# Patient Record
Sex: Male | Born: 1940
Health system: Southern US, Community
[De-identification: ages and names within clinical notes are randomized; demographics above are authoritative.]

## PROBLEM LIST (undated history)

## (undated) DIAGNOSIS — I779 Disorder of arteries and arterioles, unspecified: Secondary | ICD-10-CM

## (undated) DIAGNOSIS — Z1283 Encounter for screening for malignant neoplasm of skin: Secondary | ICD-10-CM

## (undated) DIAGNOSIS — J189 Pneumonia, unspecified organism: Secondary | ICD-10-CM

## (undated) DIAGNOSIS — E538 Deficiency of other specified B group vitamins: Secondary | ICD-10-CM

## (undated) DIAGNOSIS — K635 Polyp of colon: Secondary | ICD-10-CM

## (undated) DIAGNOSIS — D500 Iron deficiency anemia secondary to blood loss (chronic): Secondary | ICD-10-CM

## (undated) DIAGNOSIS — B019 Varicella without complication: Secondary | ICD-10-CM

## (undated) DIAGNOSIS — D649 Anemia, unspecified: Secondary | ICD-10-CM

## (undated) DIAGNOSIS — R519 Headache, unspecified: Secondary | ICD-10-CM

## (undated) DIAGNOSIS — Z9289 Personal history of other medical treatment: Secondary | ICD-10-CM

## (undated) DIAGNOSIS — T8092XA Unspecified transfusion reaction, initial encounter: Secondary | ICD-10-CM

## (undated) DIAGNOSIS — I10 Essential (primary) hypertension: Secondary | ICD-10-CM

## (undated) DIAGNOSIS — I639 Cerebral infarction, unspecified: Secondary | ICD-10-CM

## (undated) DIAGNOSIS — K219 Gastro-esophageal reflux disease without esophagitis: Secondary | ICD-10-CM

## (undated) DIAGNOSIS — Z87442 Personal history of urinary calculi: Secondary | ICD-10-CM

## (undated) DIAGNOSIS — R51 Headache: Secondary | ICD-10-CM

## (undated) DIAGNOSIS — K649 Unspecified hemorrhoids: Secondary | ICD-10-CM

## (undated) DIAGNOSIS — I739 Peripheral vascular disease, unspecified: Secondary | ICD-10-CM

## (undated) DIAGNOSIS — K5520 Angiodysplasia of colon without hemorrhage: Secondary | ICD-10-CM

## (undated) DIAGNOSIS — C801 Malignant (primary) neoplasm, unspecified: Secondary | ICD-10-CM

## (undated) DIAGNOSIS — K922 Gastrointestinal hemorrhage, unspecified: Secondary | ICD-10-CM

## (undated) DIAGNOSIS — E785 Hyperlipidemia, unspecified: Secondary | ICD-10-CM

## (undated) DIAGNOSIS — M1990 Unspecified osteoarthritis, unspecified site: Secondary | ICD-10-CM

## (undated) DIAGNOSIS — I2510 Atherosclerotic heart disease of native coronary artery without angina pectoris: Secondary | ICD-10-CM

## (undated) HISTORY — DX: Essential (primary) hypertension: I10

## (undated) HISTORY — DX: Deficiency of other specified B group vitamins: E53.8

## (undated) HISTORY — DX: Disorder of arteries and arterioles, unspecified: I77.9

## (undated) HISTORY — PX: BAND HEMORRHOIDECTOMY: SHX1213

## (undated) HISTORY — DX: Unspecified osteoarthritis, unspecified site: M19.90

## (undated) HISTORY — DX: Angiodysplasia of colon without hemorrhage: K55.20

## (undated) HISTORY — DX: Gastro-esophageal reflux disease without esophagitis: K21.9

## (undated) HISTORY — DX: Atherosclerotic heart disease of native coronary artery without angina pectoris: I25.10

## (undated) HISTORY — DX: Hyperlipidemia, unspecified: E78.5

## (undated) HISTORY — PX: CYSTOSCOPY W/ STONE MANIPULATION: SHX1427

## (undated) HISTORY — DX: Anemia, unspecified: D64.9

## (undated) HISTORY — DX: Peripheral vascular disease, unspecified: I73.9

## (undated) HISTORY — DX: Encounter for screening for malignant neoplasm of skin: Z12.83

## (undated) HISTORY — PX: CAROTID ENDARTERECTOMY: SUR193

## (undated) HISTORY — DX: Iron deficiency anemia secondary to blood loss (chronic): D50.0

## (undated) HISTORY — DX: Cerebral infarction, unspecified: I63.9

## (undated) HISTORY — DX: Unspecified hemorrhoids: K64.9

## (undated) HISTORY — DX: Unspecified transfusion reaction, initial encounter: T80.92XA

## (undated) HISTORY — DX: Varicella without complication: B01.9

---

## 1969-07-05 HISTORY — PX: OTHER SURGICAL HISTORY: SHX169

## 1976-11-04 HISTORY — PX: CERVICAL DISC SURGERY: SHX588

## 1998-11-04 HISTORY — PX: OTHER SURGICAL HISTORY: SHX169

## 1999-03-21 ENCOUNTER — Ambulatory Visit: Admission: RE | Admit: 1999-03-21 | Discharge: 1999-03-21 | Payer: Self-pay | Admitting: *Deleted

## 1999-03-28 ENCOUNTER — Inpatient Hospital Stay (HOSPITAL_COMMUNITY): Admission: RE | Admit: 1999-03-28 | Discharge: 1999-03-30 | Payer: Self-pay | Admitting: *Deleted

## 2001-04-17 ENCOUNTER — Encounter: Admission: RE | Admit: 2001-04-17 | Discharge: 2001-04-17 | Payer: Self-pay | Admitting: *Deleted

## 2001-11-04 HISTORY — PX: FEMORAL-FEMORAL BYPASS GRAFT: SHX936

## 2002-06-21 ENCOUNTER — Encounter: Payer: Self-pay | Admitting: Cardiovascular Disease

## 2002-06-21 ENCOUNTER — Ambulatory Visit (HOSPITAL_COMMUNITY): Admission: RE | Admit: 2002-06-21 | Discharge: 2002-06-21 | Payer: Self-pay | Admitting: Cardiovascular Disease

## 2002-06-23 ENCOUNTER — Encounter: Payer: Self-pay | Admitting: Cardiovascular Disease

## 2002-06-23 ENCOUNTER — Ambulatory Visit (HOSPITAL_COMMUNITY): Admission: RE | Admit: 2002-06-23 | Discharge: 2002-06-23 | Payer: Self-pay | Admitting: Cardiovascular Disease

## 2003-08-18 HISTORY — PX: CARDIAC CATHETERIZATION: SHX172

## 2003-08-26 ENCOUNTER — Encounter: Payer: Self-pay | Admitting: Thoracic Surgery (Cardiothoracic Vascular Surgery)

## 2003-08-30 ENCOUNTER — Inpatient Hospital Stay (HOSPITAL_COMMUNITY)
Admission: RE | Admit: 2003-08-30 | Discharge: 2003-09-07 | Payer: Self-pay | Admitting: Thoracic Surgery (Cardiothoracic Vascular Surgery)

## 2003-08-30 HISTORY — PX: CORONARY ARTERY BYPASS GRAFT: SHX141

## 2003-10-03 ENCOUNTER — Encounter
Admission: RE | Admit: 2003-10-03 | Discharge: 2003-10-03 | Payer: Self-pay | Admitting: Thoracic Surgery (Cardiothoracic Vascular Surgery)

## 2005-01-24 ENCOUNTER — Ambulatory Visit (HOSPITAL_COMMUNITY): Admission: RE | Admit: 2005-01-24 | Discharge: 2005-01-24 | Payer: Self-pay | Admitting: Cardiovascular Disease

## 2005-03-26 ENCOUNTER — Ambulatory Visit (HOSPITAL_COMMUNITY): Admission: RE | Admit: 2005-03-26 | Discharge: 2005-03-27 | Payer: Self-pay

## 2005-03-26 ENCOUNTER — Encounter (INDEPENDENT_AMBULATORY_CARE_PROVIDER_SITE_OTHER): Payer: Self-pay | Admitting: *Deleted

## 2005-03-26 HISTORY — PX: CAROTID ENDARTERECTOMY: SUR193

## 2005-09-12 ENCOUNTER — Ambulatory Visit: Payer: Self-pay | Admitting: Family Medicine

## 2005-12-17 ENCOUNTER — Ambulatory Visit: Payer: Self-pay | Admitting: Family Medicine

## 2006-04-01 ENCOUNTER — Ambulatory Visit: Payer: Self-pay | Admitting: Family Medicine

## 2006-10-30 ENCOUNTER — Ambulatory Visit: Payer: Self-pay | Admitting: Internal Medicine

## 2007-04-27 ENCOUNTER — Ambulatory Visit: Payer: Self-pay | Admitting: Family Medicine

## 2007-07-29 DIAGNOSIS — E785 Hyperlipidemia, unspecified: Secondary | ICD-10-CM

## 2007-07-29 DIAGNOSIS — I10 Essential (primary) hypertension: Secondary | ICD-10-CM | POA: Insufficient documentation

## 2008-01-13 ENCOUNTER — Ambulatory Visit: Payer: Self-pay | Admitting: Family Medicine

## 2008-01-13 DIAGNOSIS — Z9189 Other specified personal risk factors, not elsewhere classified: Secondary | ICD-10-CM | POA: Insufficient documentation

## 2008-01-13 DIAGNOSIS — J019 Acute sinusitis, unspecified: Secondary | ICD-10-CM

## 2008-01-19 ENCOUNTER — Telehealth: Payer: Self-pay | Admitting: Family Medicine

## 2008-01-22 ENCOUNTER — Telehealth: Payer: Self-pay | Admitting: Family Medicine

## 2008-01-25 ENCOUNTER — Encounter: Payer: Self-pay | Admitting: Family Medicine

## 2008-02-10 ENCOUNTER — Encounter: Payer: Self-pay | Admitting: Family Medicine

## 2008-03-04 ENCOUNTER — Encounter: Payer: Self-pay | Admitting: Family Medicine

## 2008-03-04 HISTORY — PX: BASAL CELL CARCINOMA EXCISION: SHX1214

## 2008-03-21 ENCOUNTER — Encounter: Payer: Self-pay | Admitting: Family Medicine

## 2008-04-25 ENCOUNTER — Ambulatory Visit: Payer: Self-pay | Admitting: Family Medicine

## 2008-04-25 DIAGNOSIS — H6120 Impacted cerumen, unspecified ear: Secondary | ICD-10-CM

## 2008-05-05 ENCOUNTER — Telehealth: Payer: Self-pay | Admitting: Family Medicine

## 2008-09-13 ENCOUNTER — Encounter: Payer: Self-pay | Admitting: Family Medicine

## 2008-09-21 ENCOUNTER — Encounter: Payer: Self-pay | Admitting: Family Medicine

## 2009-01-13 ENCOUNTER — Ambulatory Visit: Payer: Self-pay | Admitting: Family Medicine

## 2009-01-13 DIAGNOSIS — J209 Acute bronchitis, unspecified: Secondary | ICD-10-CM | POA: Insufficient documentation

## 2009-01-13 DIAGNOSIS — M1990 Unspecified osteoarthritis, unspecified site: Secondary | ICD-10-CM | POA: Insufficient documentation

## 2009-01-13 DIAGNOSIS — I2510 Atherosclerotic heart disease of native coronary artery without angina pectoris: Secondary | ICD-10-CM | POA: Insufficient documentation

## 2009-03-10 ENCOUNTER — Encounter: Payer: Self-pay | Admitting: Family Medicine

## 2009-03-15 ENCOUNTER — Encounter: Payer: Self-pay | Admitting: Family Medicine

## 2009-03-31 ENCOUNTER — Encounter: Payer: Self-pay | Admitting: Family Medicine

## 2009-07-04 ENCOUNTER — Ambulatory Visit: Payer: Self-pay | Admitting: Family Medicine

## 2009-07-18 ENCOUNTER — Telehealth: Payer: Self-pay | Admitting: Family Medicine

## 2009-10-20 ENCOUNTER — Encounter (INDEPENDENT_AMBULATORY_CARE_PROVIDER_SITE_OTHER): Payer: Self-pay | Admitting: *Deleted

## 2009-12-25 ENCOUNTER — Ambulatory Visit: Payer: Self-pay | Admitting: Family Medicine

## 2010-04-30 ENCOUNTER — Encounter: Payer: Self-pay | Admitting: Family Medicine

## 2010-05-16 ENCOUNTER — Encounter: Payer: Self-pay | Admitting: Family Medicine

## 2010-08-22 ENCOUNTER — Ambulatory Visit: Payer: Self-pay | Admitting: Family Medicine

## 2010-08-22 DIAGNOSIS — L509 Urticaria, unspecified: Secondary | ICD-10-CM | POA: Insufficient documentation

## 2010-09-06 ENCOUNTER — Telehealth: Payer: Self-pay | Admitting: Family Medicine

## 2010-09-10 ENCOUNTER — Ambulatory Visit: Payer: Self-pay | Admitting: Family Medicine

## 2010-09-10 DIAGNOSIS — Z87442 Personal history of urinary calculi: Secondary | ICD-10-CM

## 2010-09-10 DIAGNOSIS — N200 Calculus of kidney: Secondary | ICD-10-CM | POA: Insufficient documentation

## 2010-09-11 ENCOUNTER — Encounter: Payer: Self-pay | Admitting: Family Medicine

## 2010-11-09 ENCOUNTER — Encounter: Payer: Self-pay | Admitting: Family Medicine

## 2010-12-06 NOTE — Assessment & Plan Note (Signed)
Summary: SINUSITIS? // RS   Vital Signs:  Patient profile:   70 year old male Weight:      155 pounds O2 Sat:      97 % Temp:     98.1 degrees F Pulse (ortho):   67 / minute BP sitting:   130 / 80  (left arm)  Vitals Entered By: Pura Spice, RN (August 22, 2010 2:11 PM) CC: c/o waking up this am with  bump on rt forehead with pain across forehead down side of face to neck shoulder. c/o sinus inf    History of Present Illness: Here for one week of sinus symptoms and one day of a rash. For the past week he has had sinus pressure, HA, PND, and blowing green mucus from the nose. No cough or fever. Then last night he developed a rash over the forehead and the face. This am the rash has spread to the trunk and upper arms. This is not itchy but it has a burning quality to it. No raised lesions or blisters are seen. He is not taking any new medications. He says he often gets a rash like this when he has an infection.   Allergies (verified): No Known Drug Allergies  Past History:  Past Medical History: Reviewed history from 01/13/2009 and no changes required. Hyperlipidemia Hypertension Chickenpox Blood transfusion Coronary artery disease, sees Dr. Alanda Amass Osteoarthritis, sees Dr. Charlann Boxer  Review of Systems  The patient denies anorexia, fever, weight loss, weight gain, vision loss, decreased hearing, hoarseness, chest pain, syncope, dyspnea on exertion, peripheral edema, prolonged cough, hemoptysis, abdominal pain, melena, hematochezia, severe indigestion/heartburn, hematuria, incontinence, genital sores, muscle weakness, suspicious skin lesions, transient blindness, difficulty walking, depression, unusual weight change, abnormal bleeding, enlarged lymph nodes, angioedema, breast masses, and testicular masses.    Physical Exam  General:  Well-developed,well-nourished,in no acute distress; alert,appropriate and cooperative throughout examination Head:  Normocephalic and atraumatic  without obvious abnormalities. No apparent alopecia or balding. Eyes:  No corneal or conjunctival inflammation noted. EOMI. Perrla. Funduscopic exam benign, without hemorrhages, exudates or papilledema. Vision grossly normal. Ears:  External ear exam shows no significant lesions or deformities.  Otoscopic examination reveals clear canals, tympanic membranes are intact bilaterally without bulging, retraction, inflammation or discharge. Hearing is grossly normal bilaterally. Nose:  External nasal examination shows no deformity or inflammation. Nasal mucosa are pink and moist without lesions or exudates. Mouth:  Oral mucosa and oropharynx without lesions or exudates.  Teeth in good repair. Neck:  No deformities, masses, or tenderness noted. Lungs:  Normal respiratory effort, chest expands symmetrically. Lungs are clear to auscultation, no crackles or wheezes. Skin:  macular erythematous rash as above. No vessicles    Impression & Recommendations:  Problem # 1:  ACUTE SINUSITIS, UNSPECIFIED (ICD-461.9)  His updated medication list for this problem includes:    Levaquin 500 Mg Tabs (Levofloxacin) ..... Once daily  Problem # 2:  URTICARIA (ICD-708.9)  Complete Medication List: 1)  Metoprolol Succinate 50 Mg Tb24 (Metoprolol succinate) .... 1/2 twice daily 2)  Altace 10 Mg Tabs (Ramipril) .... Once daily 3)  Hydrochlorothiazide 25 Mg Tabs (Hydrochlorothiazide) .... 2 daily 4)  Simvastatin 20 Mg Tabs (Simvastatin) .... Take 1 tablet by mouth once a day 5)  Aspirin 325 Mg Tbec (Aspirin) .... Once daily 6)  Nifedipine Cr Osmotic 90 Mg Tb24 (Nifedipine) .Marland Kitchen.. 1 by mouth once daily 7)  Cortisporin 5-10000-1 Susp (Neomycin-polymyxin-hc) .... 4 drops into ears 4 times a day as needed 8)  Levaquin 500 Mg Tabs (Levofloxacin) .... Once daily  Patient Instructions: 1)  the hives seems to be coming from the sinusitis. Try some Benadryl as needed . 2)  Please schedule a follow-up appointment as needed .    Prescriptions: LEVAQUIN 500 MG TABS (LEVOFLOXACIN) once daily  #10 x 0   Entered and Authorized by:   Nelwyn Salisbury MD   Signed by:   Nelwyn Salisbury MD on 08/22/2010   Method used:   [facility]        [facility] [phone]retail[facility]       [address]       [place], [place]  [address]       Ph: [phone] or [phone]       Fax: [phone]   RxID:   [id number]    Orders Added: 1)  Est. Patient Level IV [36644]

## 2010-12-06 NOTE — Letter (Signed)
Summary: [facility]   Imported By: Maryln Gottron 11/22/2010 10:00:31  _____________________________________________________________________  External Attachment:    Type:   Image     Comment:   External Document

## 2010-12-06 NOTE — Assessment & Plan Note (Signed)
Summary: ear inj/sinus inf/fatigue/per Dr. Karalee Height   Vital Signs:  Patient profile:   70 year old male Weight:      154 pounds BMI:     24.21 Temp:     98.2 degrees F oral Pulse rate:   56 / minute BP sitting:   138 / 62  (left arm) Cuff size:   regular  Vitals Entered By: Alfred Levins, CMA (December 25, 2009 1:41 PM) CC: sinus pressure, ears are clogged   History of Present Illness: Here for one week of pain in the ears, pressure in the head, decreased hearing, and some mild dizziness. No ST or cough or fever.   Allergies: No Known Drug Allergies  Past History:  Past Medical History: Reviewed history from 01/13/2009 and no changes required. Hyperlipidemia Hypertension Chickenpox Blood transfusion Coronary artery disease, sees Dr. Alanda Amass Osteoarthritis, sees Dr. Charlann Boxer  Review of Systems  The patient denies anorexia, fever, weight loss, weight gain, vision loss, hoarseness, chest pain, syncope, dyspnea on exertion, peripheral edema, prolonged cough, headaches, hemoptysis, abdominal pain, melena, hematochezia, severe indigestion/heartburn, hematuria, incontinence, genital sores, muscle weakness, suspicious skin lesions, transient blindness, difficulty walking, depression, unusual weight change, abnormal bleeding, enlarged lymph nodes, angioedema, breast masses, and testicular masses.    Physical Exam  General:  Well-developed,well-nourished,in no acute distress; alert,appropriate and cooperative throughout examination Head:  Normocephalic and atraumatic without obvious abnormalities. No apparent alopecia or balding. Eyes:  No corneal or conjunctival inflammation noted. EOMI. Perrla. Funduscopic exam benign, without hemorrhages, exudates or papilledema. Vision grossly normal. Ears:  both ears have very narrowed canals, and both are filled with cerumen Nose:  External nasal examination shows no deformity or inflammation. Nasal mucosa are pink and moist without lesions or  exudates. Mouth:  Oral mucosa and oropharynx without lesions or exudates.  Teeth in good repair. Neck:  No deformities, masses, or tenderness noted. Lungs:  Normal respiratory effort, chest expands symmetrically. Lungs are clear to auscultation, no crackles or wheezes.   Impression & Recommendations:  Problem # 1:  CERUMEN IMPACTION (ICD-380.4)  Complete Medication List: 1)  Metoprolol Succinate 50 Mg Tb24 (Metoprolol succinate) .... 1/2 twice daily 2)  Altace 10 Mg Tabs (Ramipril) .... Once daily 3)  Hydrochlorothiazide 25 Mg Tabs (Hydrochlorothiazide) .... 2 daily 4)  Simvastatin 20 Mg Tabs (Simvastatin) .... Take 1 tablet by mouth once a day 5)  Aspirin 325 Mg Tbec (Aspirin) .... Once daily 6)  Nifedipine Cr Osmotic 90 Mg Tb24 (Nifedipine) .Marland Kitchen.. 1 by mouth once daily 7)  Cortisporin 5-10000-1 Susp (Neomycin-polymyxin-hc) .... 4 drops into ears 4 times a day as needed  Patient Instructions: 1)  the ears were irrigated clear with water. 2)  Please schedule a follow-up appointment as needed .  Prescriptions: CORTISPORIN 5-10000-1  SUSP (NEOMYCIN-POLYMYXIN-HC) 4 drops into ears 4 times a day as needed  #30 x 5   Entered and Authorized by:   Nelwyn Salisbury MD   Signed by:   Nelwyn Salisbury MD on 12/25/2009   Method used:   [facility]        [phone][facility] (retail[facility]       [address]       [place], [place]  [address]       Ph: [phone] or [phone]       Fax: [phone]   RxID:   [id number]

## 2010-12-06 NOTE — Progress Notes (Signed)
Summary: Pt passed large kidney stone this a.m. Req antibiotic   Phone Note Call from Patient Call back at Home Phone [phone] Call back at Leave detailed message   Caller: Patient Summary of Call: Pt called and said that he passed a kidney stone this a.m. and would like to know if he needs an antibiotic for inflamation? Pt says that the stone was about size of pinto bean and now it burns severely when he urinates. [facility] [address]  Initial call taken by: Lucy Antigua,  September 06, 2010 8:50 AM  Follow-up for Phone Call        call in Cipro 500 mg two times a day for 7 days  Follow-up by: Nelwyn Salisbury MD,  September 06, 2010 9:47 AM  Additional Follow-up for Phone Call Additional follow up Details #1::        ok done  pt aware.  Additional Follow-up by: Pura Spice, RN,  September 06, 2010 11:56 AM    New/Updated Medications: CIPRO 500 MG TABS (CIPROFLOXACIN HCL) 1 by mouth two times a day for seven days Prescriptions: CIPRO 500 MG TABS (CIPROFLOXACIN HCL) 1 by mouth two times a day for seven days  #14 x 0   Entered by:   Pura Spice, RN   Authorized by:   Nelwyn Salisbury MD   Signed by:   Pura Spice, RN on 09/06/2010   Method used:   [facility] to        [facility] [phone][facility] (retail[facility]       [address]       [place], [place]  [address]       Ph: [phone] or [phone]       Fax: [phone]   RxID:   [id number]   Appended Document: Pt passed large kidney stone this a.m. Req antibiotic  Pt has passed another larger kidney stone.

## 2010-12-06 NOTE — Assessment & Plan Note (Signed)
Summary: PT PASSED KIDNEY STONES/RCD   Vital Signs:  Patient profile:   70 year old male Weight:      154 pounds O2 Sat:      97 % Temp:     98.3 degrees F Pulse rate:   63 / minute BP sitting:   124 / 72  (left arm)  Vitals Entered By: Pura Spice, RN (September 10, 2010 10:06 AM) CC: passed  2 kidney stones last Thurdsday  denies hemauria and no pain    History of Present Illness: Here to follow up after passing 2 kidney stones last week at home. Surprisingly he had little pain during this. Now he feels fine, no discomfort, passing urine easily. Of note, he sees Dr. Alanda Amass every 3 months and he always gets an abdominal US in that office to check his kidneys and renal arteries. He will  be seen there next week. He brings the stones with him today in a pill bottle.   Allergies: No Known Drug Allergies  Past History:  Past Medical History: Hyperlipidemia Hypertension Chickenpox Blood transfusion Coronary artery disease, sees Dr. Alanda Amass Osteoarthritis, sees Dr. Charlann Boxer Nephrolithiasis, hx of  Past Surgical History: Neck surgery 1978 Bypass rt leg 2000 Coronary bypass 2003 right Carotid endarterectomy basket extraction of ureteral stone in 1970s  Review of Systems  The patient denies anorexia, fever, weight loss, weight gain, vision loss, decreased hearing, hoarseness, chest pain, syncope, dyspnea on exertion, peripheral edema, prolonged cough, headaches, hemoptysis, abdominal pain, melena, hematochezia, severe indigestion/heartburn, hematuria, incontinence, genital sores, muscle weakness, suspicious skin lesions, transient blindness, difficulty walking, depression, unusual weight change, abnormal bleeding, enlarged lymph nodes, angioedema, breast masses, and testicular masses.    Physical Exam  General:  Well-developed,well-nourished,in no acute distress; alert,appropriate and cooperative throughout examination. The 2 stones are smooth, round, and about 8mm in  diameter Abdomen:  Bowel sounds positive,abdomen soft and non-tender without masses, organomegaly or hernias noted.   Impression & Recommendations:  Problem # 1:  NEPHROLITHIASIS (ICD-592.0)  Orders: T- * Misc. Laboratory test [phone])  Complete Medication List: 1)  Metoprolol Succinate 50 Mg Tb24 (Metoprolol succinate) .... 1/2 twice daily 2)  Altace 10 Mg Tabs (Ramipril) .... Once daily 3)  Hydrochlorothiazide 25 Mg Tabs (Hydrochlorothiazide) .... 2 daily 4)  Simvastatin 20 Mg Tabs (Simvastatin) .... Take 1 tablet by mouth once a day 5)  Aspirin 325 Mg Tbec (Aspirin) .... Once daily 6)  Nifedipine Cr Osmotic 90 Mg Tb24 (Nifedipine) .Marland Kitchen.. 1 by mouth once daily 7)  Cortisporin 5-10000-1 Susp (Neomycin-polymyxin-hc) .... 4 drops into ears 4 times a day as needed 8)  Cipro 500 Mg Tabs (Ciprofloxacin hcl) .Marland Kitchen.. 1 by mouth two times a day for seven days  Patient Instructions: 1)  I suggested he drink plenty of water every day. Will send one stone to the lab for analysis. He will get the [place] next week, and this will tell us if there are any more calculi inside of him.    Orders Added: 1)  Est. Patient Level IV [60454] 2)  T- * Misc. Laboratory test [phone]

## 2010-12-07 NOTE — Letter (Signed)
Summary: [facility]   Imported By: Maryln Gottron 05/08/2010 14:14:11  _____________________________________________________________________  External Attachment:    Type:   Image     Comment:   External Document

## 2011-01-14 ENCOUNTER — Other Ambulatory Visit: Payer: Self-pay | Admitting: Family Medicine

## 2011-01-15 MED ORDER — NEOMYCIN-POLYMYXIN-HC 3.5-10000-1 OT SOLN: 4.0000 [drp] | Freq: Three times a day (TID) | OTIC | Status: AC

## 2011-01-15 MED ORDER — NEOMYCIN-POLYMYXIN-HC 3.5-10000-1 OT SUSP: 4.0000 [drp] | Freq: Four times a day (QID) | OTIC | Status: DC

## 2011-03-22 NOTE — Discharge Summary (Signed)
NAME:  Earl Castillo, Earl Castillo NO.:  [id number]   MEDICAL RECORD NO.:  [id number]                   PATIENT TYPE:  INP   LOCATION:  2035                                 FACILITY:  [facility]   PHYSICIAN:  Salvatore Decent. Cornelius Moras, M.D.              DATE OF BIRTH:  October 17, 1941   DATE OF ADMISSION:  08/30/2003  DATE OF DISCHARGE:                                 DISCHARGE SUMMARY   PRIMARY ADMITTING DIAGNOSIS:  Severe three-vessel coronary artery disease.   ADDITIONAL/DISCHARGE DIAGNOSES:  1. Severe three-vessel coronary artery disease.  2. Hypertension.  3. Hyperlipidemia.  4. Longstanding tobacco abuse.  5. History of kidney stones.  6. Postoperative tracheobronchitis.  7. Mild postoperative anemia.  8. Sternal drainage, resolved.   PROCEDURES PERFORMED:  1. Coronary artery bypass grafting x3 (left internal mammary artery to the     LAD distally, right internal mammary artery to the first circumflex     marginal, saphenous vein graft to the distal right coronary artery).  2. Endoscopic vein harvest, left thigh.   HISTORY:  The patient is a 70 year old white male with a history of multiple  risk factors including hypertension, hyperlipidemia, longstanding tobacco  abuse, and strong family history of coronary artery disease.  He has no  previous history of coronary artery disease himself but has been followed  closely by Dr. Alanda Amass over the years.  He recently underwent a stress  Cardiolite exam on August 01, 2003.  This demonstrated evidence of  probable ischemia in the inferolateral wall which was new in comparison with  previous studies.  His resting ejection fraction was 62%.  He underwent  cardiac catheterization on August 18, 2003 and was found to have severe  three-vessel coronary artery disease with well preserved left ventricular  function.  He was not felt to be a candidate for percutaneous intervention.  He was seen by Dr. Tressie Stalker as an  outpatient in consultation for  possible surgical intervention.  Dr. Cornelius Moras felt that the patient was a good  candidate and that surgical revascularization is his best long-term option.  After the discussion of the risks, benefits, and alternatives to the  procedure the patient agreed to proceed.  He was scheduled for elective  admission on August 30, 2003.   HOSPITAL COURSE:  He was admitted on August 30, 2003 and was taken to the  operating room where he underwent CABG x3 as described in detail above.  He  tolerated the procedure well and he was transferred to the SICU in stable  condition.  He was extubated shortly after surgery.  He was hemodynamically  stable and was doing well on postoperative day #1.  Initially he was  atrially paced but over the course of the first 24 hours this was weaned and  discontinued.  At that time he was noted to be in normal sinus rhythm with  rates in the 80s to  90s.  He was started on beta blocker therapy.  His chest  tubes were removed and he was transferred to the floor.  He was mobilized  with the assistance of cardiac rehab phase 1.  He was started on diuresis  for volume overload.  He initially ran low-grade fevers around 100.6 to  100.9.  He developed a productive cough.  His white blood cell count was  also elevated at 24,000.  He was started on IV vancomycin as well as  Maxipime.  He was treated with aggressive pulmonary toilet measures  including incentive spirometry and flutter valve as well as nebulizer  treatments.  Over the course of the next 48 hours his white blood cell count  began to trend downward and from that point on he remained afebrile.  He  also was noted to have a small amount of drainage from his sternal incision,  mostly serosanguineous.  This was observed closely as well and over the  course of the next several days began to improve significantly.  He has also  been somewhat anemic in his postoperative period with hemoglobin  and  hematocrit at 7.7 and 21.7 at present.  He has remained asymptomatic and has  been started on iron replacement therapy.  Dr. Barry Dienes discussed possible  transfusion with the patient but since he is asymptomatic and the patient  desired to avoid transfusion if possible, it was elected to monitor this  closely and treat conservatively.  He has now completed four days of IV  antibiotics and has been switched to p.o. Augmentin.  Throughout this time  he has been slowly weaned from supplemental oxygen and is maintaining O2  saturations of greater than 90% on room air.  Again, he has remained  afebrile; also, other vital signs have been stable.  He has remained in  normal sinus rhythm throughout his admission.  He has been diuresed back  down to within 1 pound of his preoperative weight.  His surgical incision  sites are all healing well and his sternum has remained stable, and the  sternal drainage has resolved.  His pulmonary issues are also improving.  His white blood cell count is now down to 7.8.  His other labs show  hemoglobin of 7.7, hematocrit 21.7 as previously mentioned, platelets of  267, potassium 3.8 which has been supplemented, BUN 8, creatinine 0.9.  He  has been tolerating a regular diet and having normal bowel and bladder  function.  He is ambulating in the hall without difficulty.  It is felt that  if he continues to remain stable over the next 24 hours and no other  problems occur he should be ready for discharge home by September 07, 2003.   DISCHARGE MEDICATIONS:  1. Enteric-coated aspirin 325 mg daily.  2. Augmentin 875 b.i.d. for one week.  3. Altace 5 mg daily.  4. Lopressor 25 mg b.i.d.  5. Niferex 150 mg daily.  6. Zocor 20 mg q.h.s.  7. Tylox one to two q.4h. p.r.n. for pain.   DISCHARGE INSTRUCTIONS:  He is to refrain from driving, heavy lifting, or  strenuous activity.  He may continue daily walking, use of his incentive spirometer.  He is asked to continue a  low fat/low sodium diet.  He may  shower daily and clean his incisions with soap and water.   DISCHARGE FOLLOW-UP:  He is asked to see Dr. Alanda Amass on September 26, 2003  at 11:15 a.m.  He will follow up with Dr.  Cornelius Moras on Monday, October 03, 2003  at 1 p.m.  He will have a chest x-ray at [facility] one  hour prior to this appointment and will bring his films to the [facility] office  for Dr. Cornelius Moras to review.  He should call our office in the interim if he  experiences any problems or has questions.      Coral Ceo, P.A.                        Salvatore Decent. Cornelius Moras, M.D.    GC/MEDQ  D:  09/06/2003  T:  09/07/2003  Job:  [id number]   cc:   Gerlene Burdock A. Alanda Amass, M.D.  [phone] [address]  [place]  [place] [address]  Fax: [phone]

## 2011-03-22 NOTE — H&P (Signed)
NAME:  Earl Castillo.                        ACCOUNT NO.:  [id number]   MEDICAL RECORD NO.:  [id number]                   PATIENT TYPE:  INP   LOCATION:                                       FACILITY:  [facility]   PHYSICIAN:  Salvatore Decent. Cornelius Moras, M.D.              DATE OF BIRTH:  [birth year]   DATE OF ADMISSION:  08/30/2003  DATE OF DISCHARGE:                                HISTORY & PHYSICAL   DATE OF PLANNED HOSPITAL ADMISSION:  August 30, 2003   REFERRING PHYSICIAN:  Richard A. Alanda Amass, M.D.   REASON FOR CONSULTATION:  Three-vessel coronary artery disease.   HISTORY OF PRESENT ILLNESS:  Mr. Durr is a 70 year old gentleman with no  previous history of coronary artery disease but risk factors including  history of hypertension, hyperlipidemia, longstanding tobacco abuse, and a  strong family history of premature coronary artery disease.  The patient has  been followed closely by Dr. Alanda Amass over the years and he recently  underwent a follow-up stress Cardiolite exam on August 01, 2003.  This  examination demonstrated new evidence of probable ischemia in the  inferolateral wall which was new in comparison with previous studies.  His  resting ejection fraction remained normal at 62%.  The patient subsequently  underwent elective cardiac catheterization on August 18, 2003.  This was  performed at the [facility] and findings were notable for  severe three-vessel coronary artery disease with coronary anatomy not  favorable for percutaneous coronary intervention.  Left ventricular function  remained normal.  The patient has now been referred for elective surgical  consultation.   REVIEW OF SYSTEMS:  GENERAL:  The patient reports feeling well.  He has good  appetite, has not been gaining or losing weight.  CARDIAC:  The patient  specifically denies any symptoms suggestive of angina.  He reports no  exertional shortness of breath, chest pain, or chest tightness.  The  patient  denies history of PND, orthopnea, or lower extremity edema.  He has not had  any palpitations or syncopal episodes.  RESPIRATORY:  Negative.  The patient  does have persistent intermittent dry, nonproductive cough which he relates  to longstanding tobacco use.  He denies any productive cough, hemoptysis, or  wheezing.  GASTROINTESTINAL:  Essentially negative.  The patient does report  intermittent mild constipation.  He has no difficulty swallowing.  He denies  problems with symptoms of reflux or frequent dyspepsia.  He has not had  diarrhea.  He reports no history of hematochezia, hematemesis, or melena.  MUSCULOSKELETAL:  Notable in that the patient strained his left lower back  several months ago and this has been slow to resolve.  He otherwise reports  no complaints.  PERIPHERAL VASCULAR:  Negative.  The patient denies any  symptoms suggestive of claudication although he does note that both legs  hurt when he pushed himself during his stress test performed  recently.  He  states that he was essentially running at the end of this procedure.  He  denies any problems with phlebitis or lower extremity edema.  ENDOCRINE:  Negative.  The patient denies symptoms suggestive of diabetes.  NEUROLOGIC:  Negative.  The patient denies symptoms suggestive of previous TIA or stroke.  INFECTIOUS:  Negative.  The patient denies recent fevers or chills.  HEMATOLOGIC:  Negative.  The patient denies bleeding diathesis.  PSYCHIATRIC:  Negative.  HEENT:  Negative.   PAST MEDICAL HISTORY:  Notable for history of hypertension, peripheral  vascular disease, hyperlipidemia, and longstanding tobacco abuse.  The  patient also has had kidney stones in the distant past and he was told that  he had an ulcer many years ago although this resolved without significant  diagnostic intervention or medical treatment.  The patient specifically  denies any previous history of myocardial infarction, congestive heart   failure, previous stroke, or diabetes mellitus.   PAST SURGICAL HISTORY:  Notable for cervical laminectomy and discectomy in  1978 as well as left-to-right femoral-femoral bypass by Dr. Madilyn Fireman in May  2000 for peripheral vascular disease with right lower extremity  claudication.   FAMILY HISTORY:  Notable for a strong history of premature coronary artery  disease.  The patient's father died of myocardial infarction at age 62.  One  of the patient's brothers died of myocardial infarction at age 56.  I have  personally operated on one of the patient's brothers for coronary artery  bypass surgery.  I have also operated on his wife for the same problem.   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  He has a longstanding history of heavy  tobacco abuse although he states he has cut back to the point where he now  smokes one-half pack of cigarettes per day.  The patient denies history of  excessive alcohol consumption.   CURRENT MEDICATIONS:  1. Aspirin 81 mg once daily.  2. Altace 10 mg once daily.  3. Zocor 20 mg once daily.  4. Hydrochlorothiazide 50 mg once daily.  5. Norvasc 10 mg once daily.  6. The patient has been taking Plavix 75 mg once daily although he stopped     this four days ago after his cardiac catheterization.   ALLERGIES:  The patient denies any known drug allergies or sensitivities.   PHYSICAL EXAMINATION:  GENERAL:  Notable for a well-appearing white male who  appears his stated age in no acute distress.  VITAL SIGNS:  Blood pressure is 160/70, pulse 72 and regular, oxygen  saturation 97% on room air; he is afebrile.  HEENT:  Essentially within normal limits.  NECK:  Supple.  There is no cervical or supraclavicular lymphadenopathy.  There is no jugular venous distention. CHEST:  Auscultation demonstrates clear and  symmetrical breath sounds  bilaterally with exception of a few scattered inspiratory crackles.  No  wheezes or rhonchi are noted.  CARDIOVASCULAR:  Included regular rate and rhythm.  No murmurs, rubs, or  gallops are noted.  ABDOMEN:  Flat, soft, and nontender.  There are no palpable masses.  Bowel  sounds are present.  The liver edge is not palpable.  EXTREMITIES:  Warm and well perfused.  There is no lower extremity edema.  Distal pulses are not palpable in either lower leg at the ankle.  There are  well-healed surgical scars in both groins from previous surgery with  palpable pulses bilaterally.  There is no sign of venous insufficiency.  RECTAL AND GENITOURINARY:  Exams are both deferred.  NEUROLOGIC:  Grossly nonfocal.  SKIN:  Clean, dry, healthy-appearing throughout.   DIAGNOSTIC TESTS:  Cardiac catheterization performed October 14 at the  [facility] is reviewed and notable for severe three-vessel  coronary artery disease with normal left ventricular function.  Specifically, there is 80% ostial stenosis of the left anterior descending  coronary artery.  There is 95% stenosis of the left circumflex coronary  artery just before bifurcation involving a large circumflex marginal branch.  There is also 95% ST elevation of the left circumflex after take-off of the  circumflex marginal branch.  There is 70% proximal stenosis and 60-70%  stenosis of the mid right coronary artery.  There is right dominant coronary  circulation.  Left ventricular function is normal with ejection fraction  estimated at 55-60% and no significant wall motion abnormalities.   Laboratory data obtained on October 11 are reviewed and notable for  hemoglobin of 16.1; hematocrit 45.6%; white blood count 9000; platelet count  218,000.  Basic metabolic panel includes sodium 138, potassium 3.4, chloride  102, bicarb 29, BUN 16, creatinine 1.0, glucose 114.  Coagulation profile is  normal with prothrombin  time 13.7 seconds, INR 1.1, PTT 39 seconds.  Lipid  profile was normal with total cholesterol 118, triglyceride 83, HDL  cholesterol 41, LDL cholesterol 60.  Urinalysis was negative and liver  function profile was within normal limits.   IMPRESSION:  Severe three-vessel coronary artery disease with normal left  ventricular function, abnormal stress Cardiolite exam, and coronary anatomy  not favorable for percutaneous coronary intervention.  I believe that  surgical revascularization is likely the best long-term treatment strategy.   PLAN:  I have outlined options at length with Mr. Kuba and his wife here  in the office today.  Alternative treatment strategies have been discussed.  They understand and accept all associated risks of surgery including but not  limited to risk of death, stroke, myocardial infarction, congestive heart  failure, pneumonia, bleeding requiring blood transfusion, arrhythmia, infection, and recurrent coronary artery disease.  We tentatively plan to  proceed with surgery on Tuesday, August 30, 2003.  All of their questions  have been addressed.                                                Salvatore Decent. Cornelius Moras, M.D.    CHO/MEDQ  D:  08/22/2003  T:  08/22/2003  Job:  [id number]   cc:   Gerlene Burdock A. Alanda Amass, M.D.  [phone] [address]  [place]  [place] [address]  Fax: [phone]

## 2011-03-22 NOTE — Op Note (Signed)
NAMEASHTIAN, VILLACIS NO.:  [id number]   MEDICAL RECORD NO.:  [id number]          PATIENT TYPE:  OIB   LOCATION:  2550                         FACILITY:  [facility]   PHYSICIAN:  Lorre Munroe., M.D.DATE OF BIRTH:  Aug 29, 1941   DATE OF PROCEDURE:  03/26/2005  DATE OF DISCHARGE:                                 OPERATIVE REPORT   POSTOPERATIVE DIAGNOSIS:  Asymptomatic right carotid stenosis.   OPERATION:  Right carotid endarterectomy.   SURGEON:  Lebron Conners, M.D.   ASSISTANT:  Rose Phi. Maple Hudson, M.D.   ANESTHESIA:  General.   CLINICAL NOTE:  The patient is a 70 year old white male who has known  peripheral vascular disease and was found to have a proximal right internal  carotid stenosis of hemodynamic significance on noninvasive study and this  was confirmed angiographically. He had no symptoms of transient blindness,  weakness or numbness. He was sent to me with recommendation for carotid  endarterectomy and I agreed that this operation was indicated and I  recommended it to him and he agreed to have it done.   PROCEDURE:  After the patient was monitored and anesthetized and had routine  preparation and draping of the right side of the neck, I made a short  incision and the mid neck paralleling the anterior border of the  sternocleidomastoid muscle staying as well down below the angle of jaw as I  could to prevent facial nerve injury. I dissected down through the  subcutaneous tissue and platysma using cautery for dissection and for  hemostasis. I encountered the common facial vein and there were actually two  veins running across the carotid and I ligated each with 2-0 silk and  divided them. I then dissected directly to the carotid pulse and came down  on the distal common carotid artery and dissected around it and controlled  it. I then took the dissection up to the carotid bifurcation identifying the  hypoglossal nerve crossing across the vessels and  mobilized and spared it  from harm. I dissected out and controlled the superior thyroid artery and  the external carotid artery and its branches and the internal carotid  artery. I dissected up the internal carotid until it softened and became a  normal feeling vessel. The plaque was easily palpable. After getting good  control and visibility and after the patient received 6000 units of heparin  intravenously, I clamped the common carotid and tightened the controls on  the other vessels and made a longitudinal arteriotomy across the plaque  beginning in the common carotid and going into the internal.  There were no  loose clots or loose fragments of plaque present in the vessel. There was  back bleeding present from the internal and external carotids. I then placed  a 10-French straight vascular shunt from the common carotid to the internal  carotid and first allowing back bleeding from the internal carotid and  tightened controls on that and restored flow to the brain. I then worked  leisurely to separate the plaque from the vessel wall and it separated  nicely. I began the  dissection of the common carotid and transected the  plaque circumferentially and then worked around it and separated the plaque  circumferentially in the proximal internal carotid and divided the plaque at  that point. I intussuscepted plaque out of the external carotid and removed  it and then back bled the external carotid again and filled vessel with  heparinized saline solution and retightened the controls. I then carefully  took the dissection up the internal carotid and the plaque thinned out very  nicely and separated easily from posterior vessel wall. I irrigated it  vigorously and saw no tendency for discussion.  It was a normal-appearing  intima at the point of separation. I removed a few small smooth muscle  fibers and loose plaque from the vessel and irrigated it out thoroughly. I  then closed the  arteriotomy by sewing in a Hemashield Finesse patch using 6-  0 Prolene suture beginning in the internal carotid and sewing down each side  into the common.  When a few sutures remained to be placed, I removed the  shunt and flushed the vessel.  I once again back bled the vessels and again  flushed the vessel. I reapplied the clamp and the finished the arteriotomy  and then restored the flow first into the external carotid and then into the  internal carotid. I got good pulses in both vessels. There was one small  flute requiring repair and then after that was done hemostasis was good.  Nevertheless I put some Surgicel on the area to assure excellent hemostasis.  I irrigated the wound with normal saline solution and removed that and felt  closure was ready as there was good hemostasis. I closed two layers of the  cervical fascia with a running 3-0 Vicryl suture and closed the skin with  running intracuticular Vicryl and Steri-Strips. The patient tolerated the  operation well, woke up without neurological deficit.      WB/MEDQ  D:  03/26/2005  T:  03/26/2005  Job:  [id number]   cc:   Gerlene Burdock A. Alanda Amass, M.D.  [phone] [address]  [place]  [place] [address]  Fax: [phone]   Tera Mater. Clent Ridges, M.D. [facility]

## 2011-03-22 NOTE — Cardiovascular Report (Signed)
NAMEBRENDT, DIBLE NO.:  [id number]   MEDICAL RECORD NO.:  [id number]          PATIENT TYPE:  OIB   LOCATION:  2866                         FACILITY:  [facility]   PHYSICIAN:  Richard A. Alanda Amass, M.D.DATE OF BIRTH:  February 17, 1941   DATE OF PROCEDURE:  01/24/2005  DATE OF DISCHARGE:                              CARDIAC CATHETERIZATION   PROCEDURE:  Retrograde central aortic catheterization, aortic arch angiogram  LAO projection, four vessel cerebral angiography using DSA imaging, and hand  injection.   PRIMARY OPERATOR:  Richard A. Alanda Amass, M.D.   PROCTOR:  Salvadore Farber, M.D. [facility]   COMPLICATIONS:  None.   ESTIMATED BLOOD LOSS:  Approximately 15 mL.   ANESTHESIA:  5 mg Valium p.o. premedication, 1% local Xylocaine, 2 mg Versed  IV for sedation.   DESCRIPTION OF PROCEDURE:  The patient is brought to the sixth floor PV lab  in a postabsorptive state after 5 mg Valium p.o. premedication.  The left  groin was prepped and draped in the usual manner because the patient has a  left to right fem/fem bypass graft.  1% Xylocaine was used for local  anesthesia.  A single anterior puncture was done with an 18 thin wall needle  and a Wholey wire was used to traverse the iliac system.  The wire, however,  went into the fem/fem bypass graft.  A 5 French sheath was inserted,  carefully positioned just beyond the puncture site, and a short right  coronary catheter was used to try to direct the guide-wire into the LEIA.  We were unable to get into the LEIA and  0.014 inch wire was then used to  try to access the native left iliac circulation.  This was also  unsuccessful, so the side-arm sheath was removed.  The patient had not been  given any heparin and pressure was placed over the left groin to control  hemostasis.  After an adequate observation, another puncture was done  slightly more lateral using a previous hand injection as a dye.  The LCFA  was entered with a  single anterior puncture and using the [facility] wire, we  were able to traverse the iliac system.  There was scar tissue there from  his prior surgery, so a 5 [place] dilator was inserted and the wire was  exchanged for an Amplatz 0.035 inch extra simple wire.  We then placed a 5  French sheath without difficulty and the wire was removed.  A 5 French  pigtail catheter was advanced across the aortic arch and aortic arch  angiogram was done using VSA in the LAO projection at 25 mL, 20 mL per  second.  The catheter was removed and replaced with a JB1 5 French  diagnostic catheter.  The right brachiocephalic was entered and the catheter  was positioned beyond the RCCA.  Hand injections of the right vertebral  system were done in the cervical PA and lateral and cranial PA and lateral  projections with good visualization.  The catheter was then pulled back and  the RCCA was accessed without difficulty.  Right carotid angiography  was  done in the PA, oblique, and lateral projections, and the PA and lateral  intracerebral projections.  The catheter was then pulled back and the LACA  was accessed and left subclavian vertebral angiography subselective was  performed in the PA projection and intracerebral PA and lateral projections.  The catheter was then pulled back and the LCCA was accessed.  Left carotid  angiography was done in the oblique, lateral, cervical projections, and PA  and lateral intracranial projections.  The catheter was removed over the  guide-wire, the side-arm sheath was flushed.  The patient tolerated the  procedure well and was transferred to the holding area in stable condition  for sheath removal and pressure hemostasis.  Arterial pressures were  monitored throughout the procedure and ranged from approximately 140 mmHg.  The patient was in sinus rhythm with a rate of 60 per minute which remained  stable.   The left to right fem/fem bypass graft appeared to be widely patent and  with  an excellent anastomosis from the left common femoral artery.   Aortic arch angiogram revealed a type 2.5 aortic arch.  Normal origin of the  great vessels.  There was moderate calcification near the origin of the  brachiocephalic and left common carotid.  There appeared to be approximately  40-50% narrowing of the left common carotid just after its origin in the  single projection.  There was good flow.  The RBCA was normal, the  subclavian proximally was normal with RIMA visualized.  The proximal RCCA  appeared normal.  The proximal LCCA appeared normal after the very ostial  and very proximal narrowing.  The subclavian had approximately 30% calcific  narrowing before the LIMA which was patent.  Both vertebral were antegrade.  The right vertebral in the v1 extraosseous and foraminal segments were  normal.  The spinal v3 segment appeared normal.  The basilar artery filled  well with no significant stenosis and there was a good filling of posterior  cerebral arteries.  The posterior inferior cerebellar artery was well  visualized.  There was normal venous return and the vessels extended  normally to the outer cranium.   Right carotid angiography demonstrated concentric narrowing segmentally of  the proximal RICA.  In the oblique projection, there appeared to be 70-80%  stenosis and in the lateral projection, there appeared to be approximately  70% stenosis.  No thrombus was visualized.  The external carotid was normal  and the cervical carotid appeared normal beyond the stenotic area with mild  post stenotic dilatation.   The intracranial RICA in the horizontal cavernous segment had approximately  30% narrowing with good residual lumen and flow.  The late anterior cerebral  filled well as did the middle cerebral and its vessels and its branches and  the vessels extended normally to the outer cranium.  There was slight filling of the left anterior cerebral.  The venous return was  normal.  The  left vertebral had approximately 30-40% narrowing just beyond the ostia.  There was some irregularity and triple tandem 30% narrowing of the foraminal  segment of the left vertebral.  The basilar artery filled well and was  normal in both posterior cerebrals and the left PICA appeared normal.  The  vessels extended to the outer cranium and appeared normal and venous return  was normal.   The left carotid had calcification with approximately 40% narrowing in its  proximal portion.  The remainder of the cervical, carotid, and intracranial  LICA  appeared normal except for approximately 40% narrowing of the  transverse segment of the cavernous portion.  There was good flow, however,  and good filling of both anterior cerebrals and middle cerebral and its  branches.  The vessels extended to the outer cranium normally and venous  return was normal.   DISCUSSION:  Mr. Reierson is a 70 year old married father of two with one  grandchild and he continues to smoke 1/3 of a pack a day of cigarettes.  He  has hyperlipidemia, hypertension under control, and has had prior CABG x 3  by Dr. Cornelius Moras August 30, 2003.  He has asymptomatic carotid stenosis and  recent Doppler showed systolic velocity of 283, diastolic of 95, with  stenosis in the 70 to greater than 90% range.  He has bilateral carotid  bruits, right greater than left, and no cerebral symptoms. He has peripheral  arterial disease with known right external iliac occlusion and is status  post left to right fem/fem bypass graft by Dr. Liliane Bade, Mar 28, 1999, 8  mm Hemashield, which is  open on prior studies and on this study with no  significant claudication.  The patient has approximately 70-80% RICA  concentric stenosis with some suggestion of possibly higher grade in the  lateral views on the leg.  There is none critical, less than 50% narrowing,  of the LICA and minor narrowing of the proximal vertebral bilaterally.   Intracranial vessels appear normal except for the transverse cavernous  segments bilaterally which have approximately 30-40% narrowing with good  residual lumen and flow.  He has a type 2.5 arch and mild calcification and  approximately 40% narrowing of the ostium of the LCCA.   I will ask my colleague, Dr. Liliane Bade, to review his cerebral angiography.  The patient is a candidate for elective RCA.  Recommend continued medical  therapy including antiplatelet, statins, therapy, and beta blockers at  present.   CATHETERIZATION DIAGNOSIS:  1.  ASPVD - chronic right external iliac occlusion, status post left to      right FFBG, Dr. Madilyn Fireman Mar 27, 2001.  2.  Coronary artery disease status post CABG x 3 Dr. Cornelius Moras August 30, 2003,      LIMA to LAD; RIMA to OM1, SVG to RCA, normal LV function.  3.  Asymptomatic bilateral carotid bruits.      1.  Greater than 70% RICA stenosis.      2.  40% LICA stenosis. 4.  Systemic hypertension past normal renal arteries.  5.  Hyperlipidemia therapy.  6.  Continued cigarette abuse.      RAW/MEDQ  D:  01/24/2005  T:  01/24/2005  Job:  [id number]

## 2011-03-22 NOTE — Op Note (Signed)
NAME:  Earl Castillo, Earl Castillo                         ACCOUNT NO.:  [id number]   MEDICAL RECORD NO.:  [id number]                   PATIENT TYPE:  INP   LOCATION:  2313                                 FACILITY:  [facility]   PHYSICIAN:  Salvatore Decent. Cornelius Moras, M.D.              DATE OF BIRTH:  01/01/1941   DATE OF PROCEDURE:  08/30/2003  DATE OF DISCHARGE:                                 OPERATIVE REPORT   PREOPERATIVE DIAGNOSIS:  Severe three-vessel coronary artery disease.   POSTOPERATIVE DIAGNOSIS:  Severe three-vessel coronary artery disease.   OPERATION PERFORMED:  Median sternotomy for coronary artery bypass grafting  times three with endoscopic vein harvest from left thigh (left internal  mammary artery to distal left anterior descending coronary artery, right  internal mammary artery to first circumflex marginal branch, saphenous vein  graft to distal right coronary artery).   SURGEON:  Salvatore Decent. Cornelius Moras, M.D.   ASSISTANT:  Eber Jones A. Eustaquio Boyden.   ANESTHESIA:  General.   INDICATIONS FOR PROCEDURE:  The patient is a 70 year old white male with a  history of hypertension, peripheral vascular disease, hyperlipidemia,  tobacco abuse, and a strong family history of coronary artery disease.  He  underwent stress Cardiolite exam which was abnormal consistent with  inferolateral ischemia.  He subsequently underwent elective cardiac  catheterization by Richard A. Alanda Amass, M.D.  This demonstrates presence of  three-vessel coronary artery disease with coronary anatomy not favorable for  percutaneous coronary intervention.  A full consultation note has been  dictated previously.  The patient and his wife have been counseled at length  regarding the indications, risks and potential benefits of surgery.  Alternative treatment strategies have been discussed.  They desire to  proceed as described.   DESCRIPTION OF PROCEDURE:  The patient was brought to the operating room on  the above mentioned  date and placed in the supine position on the operating  table.  Central monitoring was established by the anesthesia service under  the care and direction of Dr. Judie Petit.  Specifically, a Swann-Ganz  catheter was placed through the right internal jugular approach.  A radial  arterial line was placed.  Intravenous antibiotics were administered.  Following induction with general endotracheal anesthesia, a Foley catheter  was placed.  The patient's chest, abdomen, both groins, and both lower  extremities were prepped and draped in a sterile manner.   A median sternotomy incision was performed and the left internal mammary  artery was dissected from the chest wall and prepared for bypass grafting.  The left internal mammary artery was good quality conduit.  There were  diffuse adhesions between the visceral and parietal surfaces of the pleura  surrounding the left lung.  Following this, the right internal mammary  artery was dissected from the chest wall and prepared for bypass grafting.  This vessel was also good quality conduit.  There were a few adhesions  surrounding  the right lung although not as much as that surrounding the  left.  Simultaneously, saphenous vein was obtained from the patient's left  thigh using endoscopic vein harvest technique.  The saphenous vein was good  quality conduit.  The patient was heparinized systemically.   The pericardium was opened.  There are diffuse adhesions surrounding the  surface of the heart obliterating the pericardial space.  Sharp dissection  was utilized to mobilize the heart from the surrounding adhesions  circumferentially.  The ascending aorta is notable for a few areas of plaque  but for the most part it is normal on palpation.  The ascending aorta and  the right atrium were cannulated for cardiopulmonary bypass.  Adequate  heparinization was verified.  Cardiopulmonary bypass was begun.  The surface  of the heart was inspected.   All of the remaining adhesions surrounding the  surface of the heart were divided sharply.  Distal sites were selected for  coronary artery bypass grafting.  Of note, the distal left circumflex  coronary artery gives rise to a single very tiny posterolateral branch.  This vessel was too small for grafting.  A portion of saphenous vein and  both internal mammary arteries were trimmed to appropriate length.  A  temperature probe was placed in the left ventricular septum.  A cardioplegia  catheter was placed in the ascending aorta.   The patient was allowed to cool to 32 degrees systemic temperature.  The  aortic cross-clamp was applied and cardioplegia was delivered initially in  antegrade fashion through the aortic root.  Iced saline slush was applied  for topical hypothermia.  The initial cardioplegic arrest and myocardial  cooling were felt to be excellent.  Repeat doses of cardioplegia were  administered intermittently throughout the cross-clamp portion of the  operation, both through the aortic root and down the subsequently vein graft  to maintain septal temperature below 15 degrees centigrade.  The following  distal coronary anastomoses were performed:  (1) The distal right coronary  artery was grafted with saphenous vein graft in an end-to-side fashion.  This anastomosis was placed precisely at the bifurcation of the distal right  coronary artery.  This vessel was fair quality with diffuse plaque both  proximally and distally.  The posterior descending coronary artery was  diffusely diseased and somewhat small caliber.  At the site of distal bypass  the right coronary artery measured 1.7 mm in diameter.  (2)  The first  circumflex marginal branch was grafted with the right internal mammary  artery in an end-to-side fashion.  The right internal mammary artery was utilized in situ with the pedicle placed posteriorly through the transverse  sinus.  The circumflex marginal branch  measured 1.5 mm at the site of distal  bypass and is of fair quality.  There was posterior plaque.  This vessel was  diffusely diseased proximally and distally.  (3)  The distal left anterior  descending coronary artery was grafted with the left internal mammary artery  in end-to-side fashion.  This coronary measured 1.5 mm in diameter at the  site of distal bypass and was of fair to good quality.   The single proximal saphenous vein anastomosis was performed directly to the  ascending aorta prior to removal of the aortic cross-clamp.  The septal  temperature was noted to rise rapidly and dramatically upon reperfusion of  the internal mammary artery grafts.  The heart began to beat spontaneously.  The patient was placed in Trendelenburg position and  all residual air was  evacuated from the aortic root.  The aortic cross-clamp was removed after a  total cross-clamp time of 71 minutes.   The heart began to beat spontaneously without need for cardioversion.  All  proximal and distal anastomoses were inspected for hemostasis and  appropriate graft orientation.  Epicardial pacing wires were fixed to the  right ventricular outflow tract and to the right atrial appendage.  The  patient was rewarmed to 37 degrees centigrade temperature.  The patient was  weaned from cardiopulmonary bypass without difficulty.  The patient's rhythm  at separation from bypass was sinus bradycardia with first degree AV block.  AV sequential pacing is employed.  Total cardiopulmonary bypass time for the  operation is 90 minutes.  No inotropic support is required.   The venous and arterial cannulae are removed uneventfully.  Protamine was  administered to reverse the anticoagulation.  The mediastinum and both  pleural spaces were irrigated with saline solution containing vancomycin.  Meticulous surgical hemostasis was ascertained.  The mediastinum and both  pleural spaces were drained with four chest tubes placed  through separate  stab incisions inferiorly.  The median sternotomy was closed in routine  fashion.  The left lower extremity incisions were closed in multiple layers  in routine fashion.  All skin incisions were closed with subcuticular skin  closures.   The patient tolerated the procedure well and was transported to the surgical  intensive care unit in stable condition.  There were no intraoperative  complications.  All sponge, needle and instrument counts were verified  correct at completion of the operation.  No blood products were  administered.                                                 Salvatore Decent. Cornelius Moras, M.D.    CHO/MEDQ  D:  08/30/2003  T:  08/30/2003  Job:  [id number]   cc:   Gerlene Burdock A. Alanda Amass, M.D.  [phone], [address]  [place]  [place] [address]  Fax: [phone]

## 2011-08-02 ENCOUNTER — Encounter: Payer: Self-pay | Admitting: Family Medicine

## 2011-08-02 ENCOUNTER — Ambulatory Visit (INDEPENDENT_AMBULATORY_CARE_PROVIDER_SITE_OTHER): Payer: Medicare Other | Admitting: Family Medicine

## 2011-08-02 VITALS — BP 128/60 | HR 79 | Temp 98.5°F | Wt 151.0 lb

## 2011-08-02 DIAGNOSIS — L578 Other skin changes due to chronic exposure to nonionizing radiation: Secondary | ICD-10-CM

## 2011-08-02 DIAGNOSIS — Z9109 Other allergy status, other than to drugs and biological substances: Secondary | ICD-10-CM

## 2011-08-02 DIAGNOSIS — J309 Allergic rhinitis, unspecified: Secondary | ICD-10-CM

## 2011-08-02 MED ORDER — FLUTICASONE PROPIONATE 50 MCG/ACT NA SUSP: 2.0000 | Freq: Every day | NASAL | Status: DC

## 2011-08-02 NOTE — Progress Notes (Signed)
  Subjective:    Patient ID: Earl Castillo, male    DOB: October 10, 1941, 70 y.o.   MRN: [id number]  HPI Here for one month of sinus pressure, ear pressure, tinnitis, and dizziness. No HA or fever or cough.    Review of Systems  Constitutional: Negative.   HENT: Positive for hearing loss, congestion and tinnitus. Negative for ear pain, neck pain and ear discharge.   Eyes: Negative.   Neurological: Positive for dizziness.       Objective:   Physical Exam  Constitutional: He appears well-developed and well-nourished.  HENT:  Right Ear: External ear normal.  Left Ear: External ear normal.  Nose: Nose normal.  Mouth/Throat: Oropharynx is clear and moist. No oropharyngeal exudate.  Eyes: Conjunctivae are normal. Pupils are equal, round, and reactive to light.  Neck: Neck supple. No thyromegaly present.  Lymphadenopathy:    He has no cervical adenopathy.  Skin:       Multiple areas of actinic damage over the scalp           Assessment & Plan:  Try Mucinex bid and add Flonase daily. I think sinus congestion is causing his head issues. See Dr. Michaell Cowing again for a skin exam.

## 2011-09-05 ENCOUNTER — Ambulatory Visit (INDEPENDENT_AMBULATORY_CARE_PROVIDER_SITE_OTHER): Payer: Medicare Other | Admitting: Family Medicine

## 2011-09-05 ENCOUNTER — Encounter: Payer: Self-pay | Admitting: Family Medicine

## 2011-09-05 VITALS — BP 128/60 | HR 72 | Temp 97.9°F | Wt 147.0 lb

## 2011-09-05 DIAGNOSIS — J40 Bronchitis, not specified as acute or chronic: Secondary | ICD-10-CM

## 2011-09-05 MED ORDER — AMOXICILLIN-POT CLAVULANATE 875-125 MG PO TABS: 1.0000 | ORAL_TABLET | Freq: Two times a day (BID) | ORAL | Status: DC

## 2011-09-05 NOTE — Progress Notes (Signed)
  Subjective:    Patient ID: AGASTYA MEISTER, male    DOB: [birth year], 70 y.o.   MRN: [id number]  HPI Here for 4 days of chest tightness and coughing up yellow sputum. No fever or chest pain. Taking Mucinex   Review of Systems  Constitutional: Negative.   HENT: Negative.   Eyes: Negative.   Respiratory: Positive for cough and chest tightness.        Objective:   Physical Exam  Constitutional: He appears well-developed and well-nourished.  HENT:  Right Ear: External ear normal.  Left Ear: External ear normal.  Nose: Nose normal.  Mouth/Throat: Oropharynx is clear and moist. No oropharyngeal exudate.  Eyes: Conjunctivae are normal. Pupils are equal, round, and reactive to light.  Neck: No thyromegaly present.  Pulmonary/Chest: No respiratory distress. He has no rales.       Scattered rhonchi and wheezes  Lymphadenopathy:    He has no cervical adenopathy.          Assessment & Plan:  recheck prn

## 2012-01-24 DIAGNOSIS — I701 Atherosclerosis of renal artery: Secondary | ICD-10-CM | POA: Diagnosis not present

## 2012-01-24 DIAGNOSIS — I10 Essential (primary) hypertension: Secondary | ICD-10-CM | POA: Diagnosis not present

## 2012-01-30 DIAGNOSIS — I2510 Atherosclerotic heart disease of native coronary artery without angina pectoris: Secondary | ICD-10-CM | POA: Diagnosis not present

## 2012-01-30 DIAGNOSIS — Z951 Presence of aortocoronary bypass graft: Secondary | ICD-10-CM | POA: Diagnosis not present

## 2012-02-12 DIAGNOSIS — I359 Nonrheumatic aortic valve disorder, unspecified: Secondary | ICD-10-CM | POA: Diagnosis not present

## 2012-02-12 DIAGNOSIS — R011 Cardiac murmur, unspecified: Secondary | ICD-10-CM | POA: Diagnosis not present

## 2012-02-17 ENCOUNTER — Other Ambulatory Visit: Payer: Self-pay | Admitting: Dermatology

## 2012-02-17 DIAGNOSIS — C44519 Basal cell carcinoma of skin of other part of trunk: Secondary | ICD-10-CM | POA: Diagnosis not present

## 2012-02-17 DIAGNOSIS — D045 Carcinoma in situ of skin of trunk: Secondary | ICD-10-CM | POA: Diagnosis not present

## 2012-02-17 DIAGNOSIS — L570 Actinic keratosis: Secondary | ICD-10-CM | POA: Diagnosis not present

## 2012-03-19 ENCOUNTER — Encounter: Payer: Self-pay | Admitting: Family Medicine

## 2012-03-24 ENCOUNTER — Ambulatory Visit (INDEPENDENT_AMBULATORY_CARE_PROVIDER_SITE_OTHER): Payer: Medicare Other | Admitting: Family Medicine

## 2012-03-24 ENCOUNTER — Encounter: Payer: Self-pay | Admitting: Family Medicine

## 2012-03-24 ENCOUNTER — Ambulatory Visit (INDEPENDENT_AMBULATORY_CARE_PROVIDER_SITE_OTHER)
Admission: RE | Admit: 2012-03-24 | Discharge: 2012-03-24 | Disposition: A | Discharge status: Home / Self Care | Payer: Medicare Other | Source: Ambulatory Visit | Attending: Family Medicine | Admitting: Family Medicine

## 2012-03-24 VITALS — BP 126/62 | HR 57 | Temp 98.7°F | Wt 150.0 lb

## 2012-03-24 DIAGNOSIS — J40 Bronchitis, not specified as acute or chronic: Secondary | ICD-10-CM | POA: Diagnosis not present

## 2012-03-24 DIAGNOSIS — Z951 Presence of aortocoronary bypass graft: Secondary | ICD-10-CM | POA: Diagnosis not present

## 2012-03-24 DIAGNOSIS — J329 Chronic sinusitis, unspecified: Secondary | ICD-10-CM | POA: Diagnosis not present

## 2012-03-24 DIAGNOSIS — R05 Cough: Secondary | ICD-10-CM | POA: Diagnosis not present

## 2012-03-24 MED ORDER — AMOXICILLIN-POT CLAVULANATE 875-125 MG PO TABS: 1.0000 | ORAL_TABLET | Freq: Two times a day (BID) | ORAL | Status: DC

## 2012-03-24 MED ORDER — NEOMYCIN-POLYMYXIN-HC 3.5-10000-1 OT SUSP: 4.0000 [drp] | Freq: Four times a day (QID) | OTIC | Status: DC

## 2012-03-24 NOTE — Progress Notes (Signed)
  Subjective:    Patient ID: Earl Castillo, male    DOB: 1941-04-22, 71 y.o.   MRN: [id number]  HPI Here for one week of sinus pressure, HA, PND, chest congestion, and coughing up yellow sputum. No chest pain or fever. On Mucinex.    Review of Systems  Constitutional: Negative.   HENT: Positive for congestion, postnasal drip and sinus pressure.   Eyes: Negative.   Respiratory: Positive for cough and chest tightness. Negative for shortness of breath and wheezing.   Cardiovascular: Negative.        Objective:   Physical Exam  Constitutional: He appears well-developed and well-nourished.  HENT:  Right Ear: External ear normal.  Left Ear: External ear normal.  Nose: Nose normal.  Mouth/Throat: Oropharynx is clear and moist. No oropharyngeal exudate.  Eyes: Conjunctivae are normal.  Neck: No thyromegaly present.  Pulmonary/Chest: Effort normal. No respiratory distress. He has no wheezes. He has no rales.       Scattered rhonchi   Lymphadenopathy:    He has no cervical adenopathy.          Assessment & Plan:  Treat with Augmentin. Get a CXR.

## 2012-03-25 NOTE — Progress Notes (Signed)
Quick Note:  I spoke with pt ______ 

## 2012-03-31 DIAGNOSIS — Z85828 Personal history of other malignant neoplasm of skin: Secondary | ICD-10-CM | POA: Diagnosis not present

## 2012-03-31 DIAGNOSIS — L570 Actinic keratosis: Secondary | ICD-10-CM | POA: Diagnosis not present

## 2012-04-01 ENCOUNTER — Telehealth: Payer: Self-pay | Admitting: Family Medicine

## 2012-04-01 MED ORDER — FLUTICASONE PROPIONATE 50 MCG/ACT NA SUSP: 2.0000 | Freq: Every day | NASAL | Status: DC

## 2012-04-01 NOTE — Telephone Encounter (Signed)
Pt requested a 90 day supply of Flonase and I sent script e-scribe.

## 2012-04-03 ENCOUNTER — Other Ambulatory Visit: Payer: Self-pay | Admitting: Family Medicine

## 2012-04-03 NOTE — Telephone Encounter (Signed)
Please advise refill request sent from pharmacy

## 2012-04-14 DIAGNOSIS — I70219 Atherosclerosis of native arteries of extremities with intermittent claudication, unspecified extremity: Secondary | ICD-10-CM | POA: Diagnosis not present

## 2012-04-14 DIAGNOSIS — I739 Peripheral vascular disease, unspecified: Secondary | ICD-10-CM | POA: Diagnosis not present

## 2012-05-13 ENCOUNTER — Encounter: Payer: Self-pay | Admitting: Family Medicine

## 2012-05-13 ENCOUNTER — Ambulatory Visit (INDEPENDENT_AMBULATORY_CARE_PROVIDER_SITE_OTHER): Payer: Medicare Other | Admitting: Family Medicine

## 2012-05-13 VITALS — BP 132/74 | HR 69 | Temp 98.4°F | Wt 148.0 lb

## 2012-05-13 DIAGNOSIS — I10 Essential (primary) hypertension: Secondary | ICD-10-CM | POA: Diagnosis not present

## 2012-05-13 DIAGNOSIS — N401 Enlarged prostate with lower urinary tract symptoms: Secondary | ICD-10-CM

## 2012-05-13 DIAGNOSIS — N139 Obstructive and reflux uropathy, unspecified: Secondary | ICD-10-CM

## 2012-05-13 DIAGNOSIS — E785 Hyperlipidemia, unspecified: Secondary | ICD-10-CM

## 2012-05-13 DIAGNOSIS — I2510 Atherosclerotic heart disease of native coronary artery without angina pectoris: Secondary | ICD-10-CM | POA: Diagnosis not present

## 2012-05-13 DIAGNOSIS — N138 Other obstructive and reflux uropathy: Secondary | ICD-10-CM

## 2012-05-13 LAB — PSA: PSA: 0.79 ng/mL (ref 0.10–4.00)

## 2012-05-13 LAB — CBC WITH DIFFERENTIAL/PLATELET
Basophils Absolute: 0 10*3/uL (ref 0.0–0.1)
Basophils Relative: 0.3 % (ref 0.0–3.0)
Eosinophils Absolute: 0.1 10*3/uL (ref 0.0–0.7)
Eosinophils Relative: 1.1 % (ref 0.0–5.0)
HCT: 40.4 % (ref 39.0–52.0)
Hemoglobin: 13.7 g/dL (ref 13.0–17.0)
Lymphocytes Relative: 22.8 % (ref 12.0–46.0)
Lymphs Abs: 2.3 10*3/uL (ref 0.7–4.0)
MCHC: 33.8 g/dL (ref 30.0–36.0)
MCV: 98.1 fl (ref 78.0–100.0)
Monocytes Absolute: 0.7 10*3/uL (ref 0.1–1.0)
Monocytes Relative: 6.5 % (ref 3.0–12.0)
Neutro Abs: 7 10*3/uL (ref 1.4–7.7)
Neutrophils Relative %: 69.3 % (ref 43.0–77.0)
Platelets: 152 10*3/uL (ref 150.0–400.0)
RBC: 4.12 Mil/uL — ABNORMAL LOW (ref 4.22–5.81)
RDW: 13.6 % (ref 11.5–14.6)
WBC: 10.2 10*3/uL (ref 4.5–10.5)

## 2012-05-13 LAB — BASIC METABOLIC PANEL
BUN: 22 mg/dL (ref 6–23)
CO2: 27 mEq/L (ref 19–32)
Calcium: 9.2 mg/dL (ref 8.4–10.5)
Chloride: 103 mEq/L (ref 96–112)
Creatinine, Ser: 1.1 mg/dL (ref 0.4–1.5)
GFR: 68.72 mL/min (ref >60.00)
Glucose, Bld: 90 mg/dL (ref 70–99)
Potassium: 3.9 mEq/L (ref 3.5–5.1)
Sodium: 139 mEq/L (ref 135–145)

## 2012-05-13 LAB — POCT URINALYSIS DIPSTICK
Bilirubin, UA: NEGATIVE
Blood, UA: NEGATIVE
Glucose, UA: NEGATIVE
Ketones, UA: NEGATIVE
Leukocytes, UA: NEGATIVE
Nitrite, UA: NEGATIVE
Protein, UA: NEGATIVE
Spec Grav, UA: 1.015
Urobilinogen, UA: 0.2
pH, UA: 5.5

## 2012-05-13 LAB — LIPID PANEL
Cholesterol: 107 mg/dL (ref 0–200)
HDL: 41.2 mg/dL (ref >39.00)
LDL Cholesterol: 53 mg/dL (ref 0–99)
Total CHOL/HDL Ratio: 3
Triglycerides: 63 mg/dL (ref 0.0–149.0)
VLDL: 12.6 mg/dL (ref 0.0–40.0)

## 2012-05-13 LAB — HEPATIC FUNCTION PANEL
ALT: 14 U/L (ref 0–53)
AST: 18 U/L (ref 0–37)
Albumin: 3.8 g/dL (ref 3.5–5.2)
Alkaline Phosphatase: 58 U/L (ref 39–117)
Bilirubin, Direct: 0.1 mg/dL (ref 0.0–0.3)
Total Bilirubin: 0.6 mg/dL (ref 0.3–1.2)
Total Protein: 6.9 g/dL (ref 6.0–8.3)

## 2012-05-13 LAB — TSH: TSH: 0.83 u[IU]/mL (ref 0.35–5.50)

## 2012-05-13 NOTE — Progress Notes (Signed)
  Subjective:    Patient ID: Earl Castillo, male    DOB: [birth year], 71 y.o.   MRN: [id number]  HPI 71 yr old male for a cpx. He feels fine and has no concerns. He sees Dr. Alanda Amass twice a year for Cardiology issues.    Review of Systems  Constitutional: Negative.   HENT: Negative.   Eyes: Negative.   Respiratory: Negative.   Cardiovascular: Negative.   Gastrointestinal: Negative.   Genitourinary: Negative.   Musculoskeletal: Negative.   Skin: Negative.   Neurological: Negative.   Hematological: Negative.   Psychiatric/Behavioral: Negative.        Objective:   Physical Exam  Constitutional: He is oriented to person, place, and time. He appears well-developed and well-nourished. No distress.  HENT:  Head: Normocephalic and atraumatic.  Right Ear: External ear normal.  Left Ear: External ear normal.  Nose: Nose normal.  Mouth/Throat: Oropharynx is clear and moist. No oropharyngeal exudate.  Eyes: Conjunctivae and EOM are normal. Pupils are equal, round, and reactive to light. Right eye exhibits no discharge. Left eye exhibits no discharge. No scleral icterus.  Neck: Neck supple. No JVD present. No tracheal deviation present. No thyromegaly present.  Cardiovascular: Normal rate, regular rhythm, normal heart sounds and intact distal pulses.  Exam reveals no gallop and no friction rub.   No murmur heard. Pulmonary/Chest: Effort normal and breath sounds normal. No respiratory distress. He has no wheezes. He has no rales. He exhibits no tenderness.  Abdominal: Soft. Bowel sounds are normal. He exhibits no distension and no mass. There is no tenderness. There is no rebound and no guarding.  Genitourinary: Rectum normal, prostate normal and penis normal. Guaiac negative stool. No penile tenderness.  Musculoskeletal: Normal range of motion. He exhibits no edema and no tenderness.  Lymphadenopathy:    He has no cervical adenopathy.  Neurological: He is alert and oriented to person,  place, and time. He has normal reflexes. No cranial nerve deficit. He exhibits normal muscle tone. Coordination normal.  Skin: Skin is warm and dry. No rash noted. He is not diaphoretic. No erythema. No pallor.  Psychiatric: He has a normal mood and affect. His behavior is normal. Judgment and thought content normal.          Assessment & Plan:  Well exam. Get fasting labs.

## 2012-06-30 DIAGNOSIS — Z85828 Personal history of other malignant neoplasm of skin: Secondary | ICD-10-CM | POA: Diagnosis not present

## 2012-07-21 DIAGNOSIS — I2510 Atherosclerotic heart disease of native coronary artery without angina pectoris: Secondary | ICD-10-CM | POA: Diagnosis not present

## 2012-07-21 DIAGNOSIS — J449 Chronic obstructive pulmonary disease, unspecified: Secondary | ICD-10-CM | POA: Diagnosis not present

## 2012-07-21 DIAGNOSIS — I739 Peripheral vascular disease, unspecified: Secondary | ICD-10-CM | POA: Diagnosis not present

## 2012-07-23 ENCOUNTER — Ambulatory Visit (INDEPENDENT_AMBULATORY_CARE_PROVIDER_SITE_OTHER): Payer: Medicare Other | Admitting: Internal Medicine

## 2012-07-23 ENCOUNTER — Encounter: Payer: Self-pay | Admitting: Internal Medicine

## 2012-07-23 VITALS — BP 120/68 | HR 72 | Ht 65.5 in | Wt 152.0 lb

## 2012-07-23 DIAGNOSIS — R194 Change in bowel habit: Secondary | ICD-10-CM

## 2012-07-23 DIAGNOSIS — R195 Other fecal abnormalities: Secondary | ICD-10-CM | POA: Diagnosis not present

## 2012-07-23 DIAGNOSIS — R198 Other specified symptoms and signs involving the digestive system and abdomen: Secondary | ICD-10-CM

## 2012-07-23 MED ORDER — PEG-KCL-NACL-NASULF-NA ASC-C 100 G PO SOLR: 1.0000 | Freq: Once | ORAL | Status: DC

## 2012-07-23 NOTE — Patient Instructions (Addendum)
You have been scheduled for a colonoscopy with propofol. Please follow written instructions given to you at your visit today.  Please pick up your prep kit at the pharmacy within the next 1-3 days. If you use inhalers (even only as needed), please bring them with you on the day of your procedure.  cc: Susa Griffins, MD

## 2012-07-23 NOTE — Progress Notes (Signed)
Subjective:    Patient ID: Earl Castillo, male    DOB: 04/19/1941, 71 y.o.   MRN: [id number]  HPI The patient is a very pleasant elderly white man known to me from previous screening colonoscopy 8 years ago. That showed hemorrhoids. He was describing a change in his bowel habits to his cardiologist, Dr. Alanda Amass recently. The patient says that is always had some alternating bowel habits but over the past month or longer his stools have become loose. He does not move his bowels every day but when he does they're loose and this is definitely a change. He has not noticed any bleeding. He does not describe any significant abdominal pain though he has bloating and gas and uses Gas-X with partial relief. His GI review of systems is otherwise negative. 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 He had some antibiotics in May for bronchitis. He is not on any new medications otherwise. He does not seem to have problems of milk products and he does not eat sugar-free candy. No Known Allergies Outpatient Prescriptions Prior to Visit  Medication Sig Dispense Refill  . aspirin 325 MG tablet Take 325 mg by mouth daily.        . fluticasone (FLONASE) 50 MCG/ACT nasal spray Place 2 sprays into the nose daily.  48 g  3  . hydrochlorothiazide (HYDRODIURIL) 25 MG tablet Take 50 mg by mouth daily.       . metoprolol (TOPROL-XL) 50 MG 24 hr tablet daily. Take 1/2 tablet      . NIFEdipine (PROCARDIA XL/ADALAT-CC) 90 MG 24 hr tablet Take 90 mg by mouth daily.        . ramipril (ALTACE) 10 MG capsule daily. Take 3      . simvastatin (ZOCOR) 20 MG tablet Take 20 mg by mouth at bedtime.        Marland Kitchen neomycin-polymyxin-hydrocortisone (CORTISPORIN) 3.5-10000-1 otic suspension Place 4 drops into the left ear 4 (four) times daily.  30 mL  5  . amoxicillin-clavulanate (AUGMENTIN) 875-125 MG per tablet TAKE 1 TABLET BY MOUTH EVERY 12 (TWELVE) HOURS.  20 tablet  0    Past Medical History  Diagnosis Date  . Hyperlipidemia   . Hypertension   . Chicken pox   . Blood transfusion abn reaction or complication, no procedure mishap   . Coronary artery disease     sees Dr. Alanda Amass  . Osteoarthritis   . Nephrolithiasis   . Skin exam, screening for cancer     sees Dr. Girard Cooter   . Hemorrhoid    Past Surgical History  Procedure Date  . Neck surgery 1978  . Bypass rt leg 2000  . Coronary artery bypass graft 2003  . Right carotid endarterectomy   . Basket extraction of ureteral stone 1970's  . Basal cell carcinoma excision May 2009    per Dr. Ellen Henri , left nose   . Colonoscopy    History   Social History  . Marital Status: Married                 Social History Main Topics  . Smoking status: Current Every Day Smoker -- 57 years    Types: Cigarettes  . Smokeless tobacco: Never Used   Comment: smokes 3 a day  . Alcohol Use: No  . Drug Use: No    Social History Narrative   Daily caffeine    Family History  Problem Relation Age of  Onset  . Colon cancer Neg Hx      Review of Systems He has hip pain with arthritis. Both hips. All other review of systems are negative at this time except as mentioned in the history of present illness.    Objective:   Physical Exam General:  Well-developed, well-nourished and in no acute distress Eyes:  anicteric. ENT:   Mouth and posterior pharynx free of lesions. Upper dentures, lower teeth in poor repair Neck:   supple w/o thyromegaly or mass.  Lungs: Clear to auscultation bilaterally. Heart:  S1S2, no rubs, murmurs, gallops. Abdomen:  soft, non-tender, no hepatosplenomegaly, hernia, or mass and BS+.  Rectal: Deferred until colonoscopy Lymph:  no cervical or supraclavicular adenopathy. Extremities:   no edema Skin   senile purpura forearms Neuro:  A&O x 3.  Psych:  appropriate mood and  Affect.       Assessment & Plan:   1. Change in bowel habits   2. Loose stools    1. Given  the change in bowels, and 8 years since last colonoscopy, a colonoscopy is appropriate to investigate for colorectal neoplasia as a cause. The risks and benefits as well as alternatives of endoscopic procedure(s) have been discussed and reviewed. All questions answered. The patient agrees to proceed. Further plans pending these results.  I appreciate the opportunity to care for this patient.   CC: Susa Griffins M.D.

## 2012-08-04 DIAGNOSIS — K635 Polyp of colon: Secondary | ICD-10-CM

## 2012-08-04 HISTORY — DX: Polyp of colon: K63.5

## 2012-08-12 ENCOUNTER — Ambulatory Visit (AMBULATORY_SURGERY_CENTER): Payer: Medicare Other | Admitting: Internal Medicine

## 2012-08-12 ENCOUNTER — Encounter: Payer: Self-pay | Admitting: Internal Medicine

## 2012-08-12 VITALS — BP 156/48 | HR 43 | Temp 97.0°F | Resp 20 | Ht 65.5 in | Wt 152.0 lb

## 2012-08-12 DIAGNOSIS — K635 Polyp of colon: Secondary | ICD-10-CM

## 2012-08-12 DIAGNOSIS — D126 Benign neoplasm of colon, unspecified: Secondary | ICD-10-CM

## 2012-08-12 DIAGNOSIS — R198 Other specified symptoms and signs involving the digestive system and abdomen: Secondary | ICD-10-CM

## 2012-08-12 DIAGNOSIS — Z1211 Encounter for screening for malignant neoplasm of colon: Secondary | ICD-10-CM | POA: Diagnosis not present

## 2012-08-12 HISTORY — PX: COLONOSCOPY: SHX174

## 2012-08-12 MED ORDER — SODIUM CHLORIDE 0.9 % IV SOLN: 500.0000 mL | INTRAVENOUS | Status: DC

## 2012-08-12 NOTE — Op Note (Addendum)
[facility] [address] [place], [address]   COLONOSCOPY PROCEDURE REPORT  PATIENT: Earl Castillo, Earl Castillo  MR#: [id number] BIRTHDATE: 1940/12/08 , 71  yrs. old GENDER: Male ENDOSCOPIST: Iva Boop, MD, [facility] REFERRED BY: PROCEDURE DATE:  08/12/2012 PROCEDURE:   Colonoscopy with snare polypectomy ASA CLASS:   Class III INDICATIONS:change in bowel habits. MEDICATIONS: Propofol (Diprivan) 220 mg IV, These medications were titrated to patient response per physician's verbal order, and MAC sedation, administered by CRNA  DESCRIPTION OF PROCEDURE:   After the risks benefits and alternatives of the procedure were thoroughly explained, informed consent was obtained.  A digital rectal exam revealed no abnormalities of the rectum and A digital rectal exam revealed the prostate was not enlarged.   The LB [id number]  endoscope was introduced through the anus and advanced to the terminal ileum which was intubated for a short distance. No adverse events experienced.   The quality of the prep was Suprep good  The instrument was then slowly withdrawn as the colon was fully examined.      COLON FINDINGS: Four diminutive polypoid shaped sessile polyps were found in the descending colon and sigmoid colon.  A polypectomy was performed with a cold snare.  The resection was complete and the polyp tissue was partially retrieved as 2/4 polyps were recovered. The colon mucosa and terminal ileum was otherwise normal.   Small internal hemorrhoids were found.  Retroflexed views revealed internal hemorrhoids. The time to cecum=4 minutes 44 seconds. Withdrawal time=13 minutes 45 seconds.  The scope was withdrawn and the procedure completed. COMPLICATIONS: There were no complications.  ENDOSCOPIC IMPRESSION: 1.   Four diminutive sessile polyps were found in the descending colon and sigmoid colon; polypectomy was performed with a cold snare 2/4 polyps were recovered for  pathology 2.   The colon mucosa was otherwise normal good prep and the terminal ileum was normal 3.   Small internal hemorrhoids  RECOMMENDATIONS: Timing of repeat colonoscopy will be determined by pathology findings. Will observe as far as change in bowels (loose stools had developed)   eSigned:  Iva Boop, MD, [facility] 08/12/2012 11:52 AM Revised: 08/12/2012 11:52 AM  cc: Nelwyn Salisbury, MD, Susa Griffins, MD, and The Patient   PATIENT NAME:  Earl Castillo, Earl Castillo MR#: [id number]

## 2012-08-12 NOTE — Progress Notes (Signed)
Patient did not experience any of the following events: a burn prior to discharge; a fall within the facility; wrong site/side/patient/procedure/implant event; or a hospital transfer or hospital admission upon discharge from the facility. (G8907) Patient did not have preoperative order for IV antibiotic SSI prophylaxis. (G8918)  

## 2012-08-12 NOTE — Patient Instructions (Addendum)
Four tiny polyps were removed. They were small and appear benign. Two were not picked up - but the other two were and will be analyzed and I will send a letter about that.  No major problems were found to explain change in bowels, sometimes this happens. Let's see how you do with time.  Thank you for choosing me and [facility].  Iva Boop, MD, [facility]  Discharge instructions given with verbal understanding. Handouts on polyps and hemorrhoids given. Resume previous medications. YOU HAD AN ENDOSCOPIC PROCEDURE TODAY AT THE [facility]: Refer to the procedure report that was given to you for any specific questions about what was found during the examination.  If the procedure report does not answer your questions, please call your gastroenterologist to clarify.  If you requested that your care partner not be given the details of your procedure findings, then the procedure report has been included in a sealed envelope for you to review at your convenience later.  YOU SHOULD EXPECT: Some feelings of bloating in the abdomen. Passage of more gas than usual.  Walking can help get rid of the air that was put into your GI tract during the procedure and reduce the bloating. If you had a lower endoscopy (such as a colonoscopy or flexible sigmoidoscopy) you may notice spotting of blood in your stool or on the toilet paper. If you underwent a bowel prep for your procedure, then you may not have a normal bowel movement for a few days.  DIET: Your first meal following the procedure should be a light meal and then it is ok to progress to your normal diet.  A half-sandwich or bowl of soup is an example of a good first meal.  Heavy or fried foods are harder to digest and may make you feel nauseous or bloated.  Likewise meals heavy in dairy and vegetables can cause extra gas to form and this can also increase the bloating.  Drink plenty of fluids but you should avoid alcoholic beverages for  24 hours.  ACTIVITY: Your care partner should take you home directly after the procedure.  You should plan to take it easy, moving slowly for the rest of the day.  You can resume normal activity the day after the procedure however you should NOT DRIVE or use heavy machinery for 24 hours (because of the sedation medicines used during the test).    SYMPTOMS TO REPORT IMMEDIATELY: A gastroenterologist can be reached at any hour.  During normal business hours, 8:30 AM to 5:00 PM Monday through Friday, call [phone].  After hours and on weekends, please call the GI answering service at [phone] who will take a message and have the physician on call contact you.   Following lower endoscopy (colonoscopy or flexible sigmoidoscopy):  Excessive amounts of blood in the stool  Significant tenderness or worsening of abdominal pains  Swelling of the abdomen that is new, acute  Fever of 100F or higher  FOLLOW UP: If any biopsies were taken you will be contacted by phone or by letter within the next 1-3 weeks.  Call your gastroenterologist if you have not heard about the biopsies in 3 weeks.  Our staff will call the home number listed on your records the next business day following your procedure to check on you and address any questions or concerns that you may have at that time regarding the information given to you following your procedure. This is a courtesy call and so  if there is no answer at the home number and we have not heard from you through the emergency physician on call, we will assume that you have returned to your regular daily activities without incident.  SIGNATURES/CONFIDENTIALITY: You and/or your care partner have signed paperwork which will be entered into your electronic medical record.  These signatures attest to the fact that that the information above on your After Visit Summary has been reviewed and is understood.  Full responsibility of the confidentiality of this discharge  information lies with you and/or your care-partner.

## 2012-08-13 ENCOUNTER — Telehealth: Payer: Self-pay | Admitting: *Deleted

## 2012-08-13 NOTE — Telephone Encounter (Signed)
  Follow up Call-  Call back number 08/12/2012  Post procedure Call Back phone  # [phone]  Permission to leave phone message Yes     Patient questions:  Do you have a fever, pain , or abdominal swelling? no Pain Score  0 *  Have you tolerated food without any problems? yes  Have you been able to return to your normal activities? yes  Do you have any questions about your discharge instructions: Diet   no Medications  no Follow up visit  no  Do you have questions or concerns about your Care? no  Actions: * If pain score is 4 or above: No action needed, pain <4.

## 2012-08-17 ENCOUNTER — Encounter: Payer: Self-pay | Admitting: Internal Medicine

## 2012-08-17 DIAGNOSIS — Z860100 Personal history of colon polyps, unspecified: Secondary | ICD-10-CM | POA: Insufficient documentation

## 2012-08-17 NOTE — Progress Notes (Signed)
Quick Note:  Tubular adenoma (at least 1) Repeat colonoscopy 5 yrs - about 2018 ______

## 2012-08-25 DIAGNOSIS — I6529 Occlusion and stenosis of unspecified carotid artery: Secondary | ICD-10-CM | POA: Diagnosis not present

## 2012-08-25 DIAGNOSIS — I10 Essential (primary) hypertension: Secondary | ICD-10-CM | POA: Diagnosis not present

## 2012-08-25 DIAGNOSIS — E782 Mixed hyperlipidemia: Secondary | ICD-10-CM | POA: Diagnosis not present

## 2012-08-25 DIAGNOSIS — I739 Peripheral vascular disease, unspecified: Secondary | ICD-10-CM | POA: Diagnosis not present

## 2012-08-25 DIAGNOSIS — I2510 Atherosclerotic heart disease of native coronary artery without angina pectoris: Secondary | ICD-10-CM | POA: Diagnosis not present

## 2012-08-25 HISTORY — PX: OTHER SURGICAL HISTORY: SHX169

## 2012-11-04 HISTORY — PX: CATARACT EXTRACTION W/ INTRAOCULAR LENS  IMPLANT, BILATERAL: SHX1307

## 2012-11-05 ENCOUNTER — Encounter: Payer: Self-pay | Admitting: Family Medicine

## 2012-11-05 ENCOUNTER — Ambulatory Visit (INDEPENDENT_AMBULATORY_CARE_PROVIDER_SITE_OTHER): Payer: Medicare Other | Admitting: Family Medicine

## 2012-11-05 VITALS — BP 160/70 | HR 71 | Temp 98.1°F | Wt 145.0 lb

## 2012-11-05 DIAGNOSIS — Z720 Tobacco use: Secondary | ICD-10-CM

## 2012-11-05 DIAGNOSIS — R062 Wheezing: Secondary | ICD-10-CM | POA: Diagnosis not present

## 2012-11-05 DIAGNOSIS — J069 Acute upper respiratory infection, unspecified: Secondary | ICD-10-CM | POA: Diagnosis not present

## 2012-11-05 DIAGNOSIS — J40 Bronchitis, not specified as acute or chronic: Secondary | ICD-10-CM | POA: Diagnosis not present

## 2012-11-05 DIAGNOSIS — I10 Essential (primary) hypertension: Secondary | ICD-10-CM

## 2012-11-05 DIAGNOSIS — F17200 Nicotine dependence, unspecified, uncomplicated: Secondary | ICD-10-CM

## 2012-11-05 MED ORDER — ALBUTEROL SULFATE HFA 108 (90 BASE) MCG/ACT IN AERS: 2.0000 | INHALATION_SPRAY | Freq: Four times a day (QID) | RESPIRATORY_TRACT | Status: DC | PRN

## 2012-11-05 MED ORDER — BECLOMETHASONE DIPROPIONATE 40 MCG/ACT IN AERS: 2.0000 | INHALATION_SPRAY | Freq: Two times a day (BID) | RESPIRATORY_TRACT | Status: DC

## 2012-11-05 MED ORDER — AMOXICILLIN 500 MG PO CAPS: 500.0000 mg | ORAL_CAPSULE | Freq: Three times a day (TID) | ORAL | Status: DC

## 2012-11-05 NOTE — Progress Notes (Addendum)
Chief Complaint  Patient presents with  . Sinusitis    chest congestion, drainage into stomach making pt nauseous, diarrhea     HPI:  -started: 5 days ago - feeling better today -symptoms:nasal congestion, sore throat, cough, drainage - yellow -increased sputum production, wheezy, pressure in sinuses, L ear pain -denies:fever, SOB, NVD, tooth pain, strep or mono exposure -has tried: sudefed -sick contacts: none known -Hx of: bronchitis -chronic smoker  HTN: -reports always up at office visits -denies: CP, palpitations, swelling, SOB -reports cards manages this and he will schedule appt   ROS: See pertinent positives and negatives per HPI.  Past Medical History  Diagnosis Date  . Hyperlipidemia   . Hypertension   . Chicken pox   . Blood transfusion abn reaction or complication, no procedure mishap   . Coronary artery disease     sees Dr. Alanda Amass  . Osteoarthritis   . Nephrolithiasis   . Skin exam, screening for cancer     sees Dr. Girard Cooter   . Hemorrhoid   . GERD (gastroesophageal reflux disease)     Family History  Problem Relation Age of Onset  . Colon cancer Neg Hx   . Heart disease Father     History   Social History  . Marital Status: Married    Spouse Name: N/A    Number of Children: N/A  . Years of Education: N/A   Social History Main Topics  . Smoking status: Current Every Day Smoker -- 57 years    Types: Cigarettes  . Smokeless tobacco: Never Used     Comment: smokes 3 a day  . Alcohol Use: No  . Drug Use: No  . Sexually Active: None   Other Topics Concern  . None   Social History Narrative   Daily caffeine     Current outpatient prescriptions:aspirin 325 MG tablet, Take 325 mg by mouth daily.  , Disp: , Rfl: ;  fluticasone (FLONASE) 50 MCG/ACT nasal spray, Place 2 sprays into the nose daily., Disp: 48 g, Rfl: 3;  hydrochlorothiazide (HYDRODIURIL) 25 MG tablet, Take 50 mg by mouth daily. , Disp: , Rfl: ;  metoprolol (LOPRESSOR) 50 MG  tablet, , Disp: , Rfl: ;  metoprolol (TOPROL-XL) 50 MG 24 hr tablet, daily. Take 1/2 tablet, Disp: , Rfl:  neomycin-polymyxin-hydrocortisone (CORTISPORIN) 3.5-10000-1 otic suspension, Place 4 drops into the left ear 4 (four) times daily as needed., Disp: , Rfl: ;  NIFEdipine (PROCARDIA XL/ADALAT-CC) 90 MG 24 hr tablet, Take 90 mg by mouth daily.  , Disp: , Rfl: ;  ramipril (ALTACE) 10 MG capsule, daily. Take 3, Disp: , Rfl: ;  simvastatin (ZOCOR) 20 MG tablet, Take 20 mg by mouth at bedtime.  , Disp: , Rfl:  albuterol (PROVENTIL HFA;VENTOLIN HFA) 108 (90 BASE) MCG/ACT inhaler, Inhale 2 puffs into the lungs every 6 (six) hours as needed for wheezing., Disp: 1 Inhaler, Rfl: 0;  amoxicillin (AMOXIL) 500 MG capsule, Take 1 capsule (500 mg total) by mouth 3 (three) times daily., Disp: 30 capsule, Rfl: 0;  beclomethasone (QVAR) 40 MCG/ACT inhaler, Inhale 2 puffs into the lungs 2 (two) times daily., Disp: 1 Inhaler, Rfl: 0  EXAM:  Filed Vitals:   11/05/12 1046  BP: 160/70  Pulse: 71  Temp: 98.1 F (36.7 C)    There is no height on file to calculate BMI.  GENERAL: vitals reviewed and listed above, alert, oriented, appears well hydrated and in no acute distress  HEENT: atraumatic, conjunttiva clear, no obvious  abnormalities on inspection of external nose and ears, normal appearance of ear canals and TMs, clear nasal congestion, mild post oropharyngeal erythema with PND, no tonsillar edema or exudate, no sinus TTP  NECK: no obvious masses on inspection  LUNGS:scattered exp wheezes, no rales or rhonchi, good air movement  CV: HRRR, no peripheral edema  MS: moves all extremities without noticeable abnormality  PSYCH: pleasant and cooperative, no obvious depression or anxiety  ASSESSMENT AND PLAN:  Discussed the following assessment and plan:  1. Bronchitis  amoxicillin (AMOXIL) 500 MG capsule, albuterol (PROVENTIL HFA;VENTOLIN HFA) 108 (90 BASE) MCG/ACT inhaler, beclomethasone (QVAR) 40 MCG/ACT  inhaler  2. Upper respiratory infection  amoxicillin (AMOXIL) 500 MG capsule  3. Wheezing  albuterol (PROVENTIL HFA;VENTOLIN HFA) 108 (90 BASE) MCG/ACT inhaler, beclomethasone (QVAR) 40 MCG/ACT inhaler  4. HYPERTENSION  Pt to schedule appt with cards.  5. Tobacco use  Advised to quit   -upper resp infection with concern for possible underlying COPD given hx chronic smoking and recurrent bronchitis per pt report. Will tx as mild COPD exacerbation with abx given increased sputum production. Wheezing and observed mild SOB improved with neb tx. Denies trouble breathing, O2 sats good.Discussed risks/benefits of treatments. Discussed systemic versus inhaled steroids and pt prefers to do inhaled. Instruction provided for proper inhaler use. -Pt wants cards to manage his HTN - advised he schedule appt with cards in the next few weeks to manage. -Patient advised to return or notify a doctor immediately if symptoms worsen or persist or new concerns arise. And with PCP in 1-2 weeks and may consider PFTs to evaluate for COPD.  Patient Instructions  --As we discussed, we have prescribed  new medications (AMOXICILLIN, QVAR, ALBUTEROL) for you at this appointment. We discussed the common and serious potential adverse effects of this medication and you can review these and more with the pharmacist when you pick up your medication.  Please follow the instructions for use carefully and notify us immediately if you have any problems taking this medication.  --See your doctor in 1 - 2 weeks or immediately if worsening  --call you cardiologist today to schedule an appointment for your blood pressure      Laren Orama R.

## 2012-11-05 NOTE — Patient Instructions (Addendum)
--  As we discussed, we have prescribed  new medications (AMOXICILLIN, QVAR, ALBUTEROL) for you at this appointment. We discussed the common and serious potential adverse effects of this medication and you can review these and more with the pharmacist when you pick up your medication.  Please follow the instructions for use carefully and notify us immediately if you have any problems taking this medication.  --See your doctor in 1 - 2 weeks or immediately if worsening  --call you cardiologist today to schedule an appointment for your blood pressure

## 2013-01-11 DIAGNOSIS — I10 Essential (primary) hypertension: Secondary | ICD-10-CM | POA: Diagnosis not present

## 2013-01-11 DIAGNOSIS — E782 Mixed hyperlipidemia: Secondary | ICD-10-CM | POA: Diagnosis not present

## 2013-01-11 DIAGNOSIS — I7389 Other specified peripheral vascular diseases: Secondary | ICD-10-CM | POA: Diagnosis not present

## 2013-01-20 ENCOUNTER — Other Ambulatory Visit (HOSPITAL_COMMUNITY): Payer: Self-pay | Admitting: Cardiovascular Disease

## 2013-01-20 DIAGNOSIS — I701 Atherosclerosis of renal artery: Secondary | ICD-10-CM

## 2013-01-20 DIAGNOSIS — I714 Abdominal aortic aneurysm, without rupture: Secondary | ICD-10-CM

## 2013-01-21 DIAGNOSIS — N429 Disorder of prostate, unspecified: Secondary | ICD-10-CM | POA: Diagnosis not present

## 2013-01-21 DIAGNOSIS — R6889 Other general symptoms and signs: Secondary | ICD-10-CM | POA: Diagnosis not present

## 2013-01-21 DIAGNOSIS — E782 Mixed hyperlipidemia: Secondary | ICD-10-CM | POA: Diagnosis not present

## 2013-01-21 DIAGNOSIS — R5381 Other malaise: Secondary | ICD-10-CM | POA: Diagnosis not present

## 2013-02-02 ENCOUNTER — Encounter (HOSPITAL_COMMUNITY): Payer: Medicare Other

## 2013-02-02 ENCOUNTER — Ambulatory Visit (HOSPITAL_COMMUNITY)
Admission: RE | Admit: 2013-02-02 | Discharge: 2013-02-02 | Disposition: A | Discharge status: Home / Self Care | Payer: Medicare Other | Source: Ambulatory Visit | Attending: Cardiovascular Disease | Admitting: Cardiovascular Disease

## 2013-02-02 DIAGNOSIS — I739 Peripheral vascular disease, unspecified: Secondary | ICD-10-CM | POA: Diagnosis not present

## 2013-02-02 DIAGNOSIS — I714 Abdominal aortic aneurysm, without rupture, unspecified: Secondary | ICD-10-CM

## 2013-02-02 DIAGNOSIS — I701 Atherosclerosis of renal artery: Secondary | ICD-10-CM | POA: Diagnosis not present

## 2013-02-02 HISTORY — PX: OTHER SURGICAL HISTORY: SHX169

## 2013-02-02 NOTE — Progress Notes (Signed)
Renal artery duplex doppler was completed. Auriana Scalia RVT 

## 2013-04-01 ENCOUNTER — Encounter: Payer: Self-pay | Admitting: Family Medicine

## 2013-04-01 ENCOUNTER — Ambulatory Visit (INDEPENDENT_AMBULATORY_CARE_PROVIDER_SITE_OTHER): Payer: Medicare Other | Admitting: Family Medicine

## 2013-04-01 VITALS — BP 124/70 | HR 58 | Temp 98.6°F | Wt 151.0 lb

## 2013-04-01 DIAGNOSIS — J329 Chronic sinusitis, unspecified: Secondary | ICD-10-CM | POA: Diagnosis not present

## 2013-04-01 DIAGNOSIS — H60392 Other infective otitis externa, left ear: Secondary | ICD-10-CM

## 2013-04-01 DIAGNOSIS — H60399 Other infective otitis externa, unspecified ear: Secondary | ICD-10-CM | POA: Diagnosis not present

## 2013-04-01 MED ORDER — NEOMYCIN-POLYMYXIN-HC 3.5-10000-1 OT SUSP: 4.0000 [drp] | Freq: Four times a day (QID) | OTIC | Status: DC | PRN

## 2013-04-01 MED ORDER — AMOXICILLIN 875 MG PO TABS: 875.0000 mg | ORAL_TABLET | Freq: Two times a day (BID) | ORAL | Status: DC

## 2013-04-01 NOTE — Progress Notes (Signed)
Chief Complaint  Patient presents with  . Sinusitis    left side of head hurts, sneezing, coughing, fatigue     HPI:  Acute visit for sinus congestion: -started: about 1 week ago after mowing grass -symptoms:nasal congestion, yellow congestion, L ear full, more nasal congestion on the L, sore throat, cough, sneezing, L max sinus pain - seems like getting worse, max tooth pain on L -denies:fever, SOB, NVD -has tried: sudafed -sick contacts: none -Hx of: sinusitis, about once or twice per year, never had sinus surgery   ROS: See pertinent positives and negatives per HPI.  Past Medical History  Diagnosis Date  . Hyperlipidemia   . Hypertension   . Chicken pox   . Blood transfusion abn reaction or complication, no procedure mishap   . Coronary artery disease     sees Dr. Alanda Amass  . Osteoarthritis   . Nephrolithiasis   . Skin exam, screening for cancer     sees Dr. Girard Cooter   . Hemorrhoid   . GERD (gastroesophageal reflux disease)     Family History  Problem Relation Age of Onset  . Colon cancer Neg Hx   . Heart disease Father     History   Social History  . Marital Status: Married    Spouse Name: N/A    Number of Children: N/A  . Years of Education: N/A   Social History Main Topics  . Smoking status: Current Every Day Smoker -- 57 years    Types: Cigarettes  . Smokeless tobacco: Never Used     Comment: smokes 3 a day  . Alcohol Use: No  . Drug Use: No  . Sexually Active: None   Other Topics Concern  . None   Social History Narrative   Daily caffeine     Current outpatient prescriptions:albuterol (PROVENTIL HFA;VENTOLIN HFA) 108 (90 BASE) MCG/ACT inhaler, Inhale 2 puffs into the lungs every 6 (six) hours as needed for wheezing., Disp: 1 Inhaler, Rfl: 0;  aspirin 325 MG tablet, Take 325 mg by mouth daily.  , Disp: , Rfl: ;  beclomethasone (QVAR) 40 MCG/ACT inhaler, Inhale 2 puffs into the lungs 2 (two) times daily., Disp: 1 Inhaler, Rfl: 0 fluticasone  (FLONASE) 50 MCG/ACT nasal spray, Place 2 sprays into the nose daily., Disp: 48 g, Rfl: 3;  hydrochlorothiazide (HYDRODIURIL) 25 MG tablet, Take 50 mg by mouth daily. , Disp: , Rfl: ;  metoprolol (LOPRESSOR) 50 MG tablet, , Disp: , Rfl: ;  metoprolol (TOPROL-XL) 50 MG 24 hr tablet, daily. Take 1/2 tablet, Disp: , Rfl:  neomycin-polymyxin-hydrocortisone (CORTISPORIN) 3.5-10000-1 otic suspension, Place 4 drops into the left ear 4 (four) times daily as needed., Disp: 10 mL, Rfl: 0;  NIFEdipine (PROCARDIA XL/ADALAT-CC) 90 MG 24 hr tablet, Take 90 mg by mouth daily.  , Disp: , Rfl: ;  ramipril (ALTACE) 10 MG capsule, daily. Take 3, Disp: , Rfl: ;  simvastatin (ZOCOR) 20 MG tablet, Take 20 mg by mouth at bedtime.  , Disp: , Rfl:  amoxicillin (AMOXIL) 875 MG tablet, Take 1 tablet (875 mg total) by mouth 2 (two) times daily., Disp: 20 tablet, Rfl: 0  EXAM:  Filed Vitals:   04/01/13 1250  BP: 124/70  Pulse: 58  Temp: 98.6 F (37 C)    Body mass index is 24.74 kg/(m^2).  GENERAL: vitals reviewed and listed above, alert, oriented, appears well hydrated and in no acute distress  HEENT: atraumatic, conjunttiva clear, no obvious abnormalities on inspection of external nose and  ears, normal appearance of ear canals and TMs except for mild swelling and erythema of L ear canal - no mastoid TTP, white nasal congestion, mild post oropharyngeal erythema with PND, no tonsillar edema or exudate, L max sinus TTP  NECK: no obvious masses on inspection  LUNGS: clear to auscultation bilaterally, no wheezes, rales or rhonchi, good air movement  CV: HRRR, no peripheral edema  MS: moves all extremities without noticeable abnormality  PSYCH: pleasant and cooperative, no obvious depression or anxiety  ASSESSMENT AND PLAN:  Discussed the following assessment and plan:  Sinusitis - Plan: amoxicillin (AMOXIL) 875 MG tablet  Otitis, externa, infective, left - Plan: neomycin-polymyxin-hydrocortisone (CORTISPORIN)  3.5-10000-1 otic suspension  -meds per orders - risks discussed -return precautions discussed -Patient advised to return or notify a doctor immediately if symptoms worsen or persist or new concerns arise.  There are no Patient Instructions on file for this visit.   Kriste Basque R.

## 2013-04-16 ENCOUNTER — Telehealth: Payer: Self-pay | Admitting: Cardiovascular Disease

## 2013-04-16 MED ORDER — SIMVASTATIN 20 MG PO TABS: 20.0000 mg | ORAL_TABLET | Freq: Every day | ORAL | Status: DC

## 2013-04-16 NOTE — Telephone Encounter (Signed)
Still have not gotten his Simvastatin-Only has one pill left-Please call it in today-Call to [phone]!

## 2013-04-16 NOTE — Telephone Encounter (Signed)
Prescription called in to pharmacy

## 2013-04-26 DIAGNOSIS — H2510 Age-related nuclear cataract, unspecified eye: Secondary | ICD-10-CM | POA: Diagnosis not present

## 2013-05-05 ENCOUNTER — Telehealth: Payer: Self-pay | Admitting: Cardiovascular Disease

## 2013-05-05 MED ORDER — METOPROLOL TARTRATE 50 MG PO TABS: 25.0000 mg | ORAL_TABLET | Freq: Every day | ORAL | Status: DC

## 2013-05-05 NOTE — Telephone Encounter (Signed)
Please call-concerning the directions on his Metoprolol!

## 2013-05-05 NOTE — Telephone Encounter (Signed)
Returned call.  Pt wanted to know if metoprolol dose was changed.  Stated he has been taking half of 50mg  tab daily for >2 years and new Rx says to take 1 tab daily.  Reviewed chart and pt should be taking 1/2 tab daily.  Updated Rx in Epic and sent updated sig to [facility].  Pt verbalized understanding and agreed w/ plan.

## 2013-05-12 ENCOUNTER — Telehealth: Payer: Self-pay | Admitting: *Deleted

## 2013-05-12 NOTE — Telephone Encounter (Signed)
error 

## 2013-05-19 DIAGNOSIS — H2510 Age-related nuclear cataract, unspecified eye: Secondary | ICD-10-CM | POA: Diagnosis not present

## 2013-05-19 DIAGNOSIS — H21569 Pupillary abnormality, unspecified eye: Secondary | ICD-10-CM | POA: Diagnosis not present

## 2013-05-22 DIAGNOSIS — S058X9A Other injuries of unspecified eye and orbit, initial encounter: Secondary | ICD-10-CM | POA: Diagnosis not present

## 2013-05-22 DIAGNOSIS — H2000 Unspecified acute and subacute iridocyclitis: Secondary | ICD-10-CM | POA: Diagnosis not present

## 2013-05-26 DIAGNOSIS — H2510 Age-related nuclear cataract, unspecified eye: Secondary | ICD-10-CM | POA: Diagnosis not present

## 2013-06-10 ENCOUNTER — Other Ambulatory Visit: Payer: Self-pay

## 2013-06-10 MED ORDER — HYDROCHLOROTHIAZIDE 25 MG PO TABS: 25.0000 mg | ORAL_TABLET | Freq: Every day | ORAL | Status: DC

## 2013-06-10 MED ORDER — SIMVASTATIN 20 MG PO TABS: 20.0000 mg | ORAL_TABLET | Freq: Every day | ORAL | Status: DC

## 2013-06-10 MED ORDER — BENAZEPRIL HCL 40 MG PO TABS: 40.0000 mg | ORAL_TABLET | Freq: Every day | ORAL | Status: DC

## 2013-06-10 MED ORDER — NIFEDIPINE ER OSMOTIC RELEASE 90 MG PO TB24: 90.0000 mg | ORAL_TABLET | Freq: Every day | ORAL | Status: DC

## 2013-06-10 NOTE — Telephone Encounter (Signed)
Rx was sent to pharmacy electronically via [employer].  

## 2013-06-14 ENCOUNTER — Telehealth: Payer: Self-pay | Admitting: Cardiovascular Disease

## 2013-06-14 MED ORDER — HYDROCHLOROTHIAZIDE 50 MG PO TABS: 50.0000 mg | ORAL_TABLET | Freq: Every day | ORAL | Status: DC

## 2013-06-14 NOTE — Telephone Encounter (Signed)
Please call-think the dosage on two of his medicine is wrong.

## 2013-06-14 NOTE — Telephone Encounter (Signed)
Pt has always taken 50mg  of Hctz. RX was sent in wrong for 25mg  daily.  Pt verbalized understanding and correct RX was sent in

## 2013-07-21 ENCOUNTER — Other Ambulatory Visit: Payer: Self-pay | Admitting: Cardiovascular Disease

## 2013-07-21 DIAGNOSIS — E782 Mixed hyperlipidemia: Secondary | ICD-10-CM | POA: Diagnosis not present

## 2013-07-21 DIAGNOSIS — R5381 Other malaise: Secondary | ICD-10-CM | POA: Diagnosis not present

## 2013-07-21 DIAGNOSIS — R6889 Other general symptoms and signs: Secondary | ICD-10-CM | POA: Diagnosis not present

## 2013-07-21 DIAGNOSIS — I739 Peripheral vascular disease, unspecified: Secondary | ICD-10-CM | POA: Diagnosis not present

## 2013-07-21 DIAGNOSIS — J449 Chronic obstructive pulmonary disease, unspecified: Secondary | ICD-10-CM | POA: Diagnosis not present

## 2013-07-21 LAB — CBC WITH DIFFERENTIAL/PLATELET
Basophils Absolute: 0 10*3/uL (ref 0.0–0.1)
Basophils Relative: 0 % (ref 0–1)
Eosinophils Absolute: 0.1 10*3/uL (ref 0.0–0.7)
MCH: 26.6 pg (ref 26.0–34.0)
MCHC: 32.2 g/dL (ref 30.0–36.0)
Neutro Abs: 6 10*3/uL (ref 1.7–7.7)
Neutrophils Relative %: 67 % (ref 43–77)
Platelets: 232 10*3/uL (ref 150–400)
RDW: 15.5 % (ref 11.5–15.5)

## 2013-07-21 LAB — COMPREHENSIVE METABOLIC PANEL
ALT: 11 U/L (ref 0–53)
AST: 15 U/L (ref 0–37)
Alkaline Phosphatase: 56 U/L (ref 39–117)
Glucose, Bld: 98 mg/dL (ref 70–99)
Sodium: 141 mEq/L (ref 135–145)
Total Bilirubin: 0.3 mg/dL (ref 0.3–1.2)
Total Protein: 6.7 g/dL (ref 6.0–8.3)

## 2013-07-22 ENCOUNTER — Ambulatory Visit (INDEPENDENT_AMBULATORY_CARE_PROVIDER_SITE_OTHER): Payer: Medicare Other | Admitting: Family Medicine

## 2013-07-22 ENCOUNTER — Encounter: Payer: Self-pay | Admitting: Family Medicine

## 2013-07-22 VITALS — BP 124/50 | HR 66 | Temp 98.5°F | Wt 150.0 lb

## 2013-07-22 DIAGNOSIS — J019 Acute sinusitis, unspecified: Secondary | ICD-10-CM | POA: Diagnosis not present

## 2013-07-22 MED ORDER — AMOXICILLIN-POT CLAVULANATE 875-125 MG PO TABS: 1.0000 | ORAL_TABLET | Freq: Two times a day (BID) | ORAL | Status: DC

## 2013-07-22 NOTE — Progress Notes (Signed)
  Subjective:    Patient ID: Earl Castillo, male    DOB: July 04, 1941, 72 y.o.   MRN: [id number]  HPI Here for one week of sinus pressure, PND, and a dry cough.    Review of Systems  Constitutional: Negative.   HENT: Positive for congestion, postnasal drip and sinus pressure.   Eyes: Negative.   Respiratory: Positive for cough.        Objective:   Physical Exam  Constitutional: He appears well-developed and well-nourished.  HENT:  Right Ear: External ear normal.  Left Ear: External ear normal.  Nose: Nose normal.  Mouth/Throat: Oropharynx is clear and moist.  Eyes: Conjunctivae are normal.  Pulmonary/Chest: Effort normal and breath sounds normal.  Lymphadenopathy:    He has no cervical adenopathy.          Assessment & Plan:  Add Mucinex

## 2013-07-26 ENCOUNTER — Other Ambulatory Visit (HOSPITAL_COMMUNITY): Payer: Self-pay | Admitting: Cardiovascular Disease

## 2013-07-26 DIAGNOSIS — R011 Cardiac murmur, unspecified: Secondary | ICD-10-CM

## 2013-07-26 DIAGNOSIS — I739 Peripheral vascular disease, unspecified: Secondary | ICD-10-CM

## 2013-07-27 ENCOUNTER — Other Ambulatory Visit: Payer: Self-pay | Admitting: Cardiovascular Disease

## 2013-07-27 ENCOUNTER — Telehealth: Payer: Self-pay | Admitting: Cardiovascular Disease

## 2013-07-27 DIAGNOSIS — D649 Anemia, unspecified: Secondary | ICD-10-CM | POA: Diagnosis not present

## 2013-07-27 LAB — IRON AND TIBC
%SAT: 7 % — ABNORMAL LOW (ref 20–55)
TIBC: 466 ug/dL — ABNORMAL HIGH (ref 215–435)

## 2013-07-27 LAB — FERRITIN: Ferritin: 6 ng/mL — ABNORMAL LOW (ref 22–322)

## 2013-07-27 LAB — FOLATE: Folate: 16.1 ng/mL

## 2013-07-27 NOTE — Telephone Encounter (Signed)
Call to pt.  Pt stated JC did call him.  No further action taken or requests from pt.

## 2013-07-27 NOTE — Telephone Encounter (Signed)
Need Earl Castillo or soemebody to call him- concerning him seeing  Dr Thompson Grayer. He was told he was on the verge of having a heart attack-he needs to know what to do,

## 2013-07-27 NOTE — Telephone Encounter (Signed)
Returned call.  Pt stated he talked to JC, Dr. Kandis Cocking nurse, yesterday and was told his hgb was low and he could have a heart attack.  Pt stated he was also told he needed to contact Dr. Leone Payor or that Dr. Leone Payor would probably contact him.  Pt wants to know what to do.  Pt informed RN will try to contact JC in the [place] office today.  Pt verbalized understanding and agreed w/ plan.  Call to [facility] office and no answer.  Will try again later.

## 2013-07-27 NOTE — Telephone Encounter (Signed)
Message forwarded to J.C. Wildman, LPN.  

## 2013-07-28 NOTE — Telephone Encounter (Signed)
Pt. Called and informed of instructions

## 2013-08-02 ENCOUNTER — Other Ambulatory Visit: Payer: Self-pay | Admitting: Cardiovascular Disease

## 2013-08-02 ENCOUNTER — Ambulatory Visit (HOSPITAL_COMMUNITY)
Admission: RE | Admit: 2013-08-02 | Discharge: 2013-08-02 | Disposition: A | Discharge status: Home / Self Care | Payer: Medicare Other | Source: Ambulatory Visit | Attending: Cardiovascular Disease | Admitting: Cardiovascular Disease

## 2013-08-02 DIAGNOSIS — I2510 Atherosclerotic heart disease of native coronary artery without angina pectoris: Secondary | ICD-10-CM | POA: Diagnosis not present

## 2013-08-02 DIAGNOSIS — R011 Cardiac murmur, unspecified: Secondary | ICD-10-CM

## 2013-08-02 DIAGNOSIS — D649 Anemia, unspecified: Secondary | ICD-10-CM | POA: Diagnosis not present

## 2013-08-02 DIAGNOSIS — I739 Peripheral vascular disease, unspecified: Secondary | ICD-10-CM | POA: Diagnosis not present

## 2013-08-02 HISTORY — PX: TRANSTHORACIC ECHOCARDIOGRAM: SHX275

## 2013-08-02 NOTE — Progress Notes (Signed)
2D Echo Performed 08/02/2013    Hanah Moultry, RCS  

## 2013-08-03 LAB — FECAL OCCULT BLOOD, IMMUNOCHEMICAL
Fecal Occult Blood: POSITIVE — AB
Fecal Occult Blood: POSITIVE — AB

## 2013-08-04 DIAGNOSIS — K922 Gastrointestinal hemorrhage, unspecified: Secondary | ICD-10-CM

## 2013-08-04 DIAGNOSIS — Z9289 Personal history of other medical treatment: Secondary | ICD-10-CM

## 2013-08-04 HISTORY — DX: Personal history of other medical treatment: Z92.89

## 2013-08-04 HISTORY — DX: Gastrointestinal hemorrhage, unspecified: K92.2

## 2013-08-10 ENCOUNTER — Encounter: Payer: Self-pay | Admitting: Cardiovascular Disease

## 2013-08-11 ENCOUNTER — Ambulatory Visit (HOSPITAL_COMMUNITY)
Admission: RE | Admit: 2013-08-11 | Discharge: 2013-08-11 | Disposition: A | Discharge status: Home / Self Care | Payer: Medicare Other | Source: Ambulatory Visit | Attending: Cardiovascular Disease | Admitting: Cardiovascular Disease

## 2013-08-11 DIAGNOSIS — I739 Peripheral vascular disease, unspecified: Secondary | ICD-10-CM | POA: Diagnosis not present

## 2013-08-11 DIAGNOSIS — I70219 Atherosclerosis of native arteries of extremities with intermittent claudication, unspecified extremity: Secondary | ICD-10-CM

## 2013-08-11 NOTE — Progress Notes (Signed)
Arterial Duplex Lower Ext. Completed. Karsynn Deweese, BS, RDMS, RVT  

## 2013-08-16 ENCOUNTER — Emergency Department (HOSPITAL_COMMUNITY): Payer: Medicare Other

## 2013-08-16 ENCOUNTER — Encounter (HOSPITAL_COMMUNITY): Payer: Self-pay | Admitting: Emergency Medicine

## 2013-08-16 ENCOUNTER — Ambulatory Visit (INDEPENDENT_AMBULATORY_CARE_PROVIDER_SITE_OTHER): Payer: Medicare Other | Admitting: Family Medicine

## 2013-08-16 ENCOUNTER — Inpatient Hospital Stay (HOSPITAL_COMMUNITY)
Admission: EM | Admit: 2013-08-16 | Discharge: 2013-08-19 | DRG: 378 | Disposition: A | Discharge status: Home / Self Care | Payer: Medicare Other | Attending: Internal Medicine | Admitting: Internal Medicine

## 2013-08-16 ENCOUNTER — Encounter: Payer: Self-pay | Admitting: Family Medicine

## 2013-08-16 ENCOUNTER — Ambulatory Visit: Payer: Medicare Other | Admitting: Family Medicine

## 2013-08-16 VITALS — BP 126/48 | HR 95 | Temp 97.5°F | Wt 147.0 lb

## 2013-08-16 DIAGNOSIS — R5383 Other fatigue: Secondary | ICD-10-CM

## 2013-08-16 DIAGNOSIS — I129 Hypertensive chronic kidney disease with stage 1 through stage 4 chronic kidney disease, or unspecified chronic kidney disease: Secondary | ICD-10-CM | POA: Diagnosis present

## 2013-08-16 DIAGNOSIS — Z951 Presence of aortocoronary bypass graft: Secondary | ICD-10-CM | POA: Diagnosis not present

## 2013-08-16 DIAGNOSIS — I5032 Chronic diastolic (congestive) heart failure: Secondary | ICD-10-CM | POA: Diagnosis present

## 2013-08-16 DIAGNOSIS — N182 Chronic kidney disease, stage 2 (mild): Secondary | ICD-10-CM | POA: Diagnosis present

## 2013-08-16 DIAGNOSIS — D62 Acute posthemorrhagic anemia: Secondary | ICD-10-CM | POA: Diagnosis not present

## 2013-08-16 DIAGNOSIS — D649 Anemia, unspecified: Secondary | ICD-10-CM

## 2013-08-16 DIAGNOSIS — I2510 Atherosclerotic heart disease of native coronary artery without angina pectoris: Secondary | ICD-10-CM

## 2013-08-16 DIAGNOSIS — I10 Essential (primary) hypertension: Secondary | ICD-10-CM | POA: Diagnosis not present

## 2013-08-16 DIAGNOSIS — Z860100 Personal history of colon polyps, unspecified: Secondary | ICD-10-CM

## 2013-08-16 DIAGNOSIS — Z87442 Personal history of urinary calculi: Secondary | ICD-10-CM

## 2013-08-16 DIAGNOSIS — K208 Other esophagitis without bleeding: Secondary | ICD-10-CM | POA: Diagnosis not present

## 2013-08-16 DIAGNOSIS — M1990 Unspecified osteoarthritis, unspecified site: Secondary | ICD-10-CM

## 2013-08-16 DIAGNOSIS — Z85828 Personal history of other malignant neoplasm of skin: Secondary | ICD-10-CM

## 2013-08-16 DIAGNOSIS — K922 Gastrointestinal hemorrhage, unspecified: Principal | ICD-10-CM

## 2013-08-16 DIAGNOSIS — F17200 Nicotine dependence, unspecified, uncomplicated: Secondary | ICD-10-CM | POA: Diagnosis present

## 2013-08-16 DIAGNOSIS — D500 Iron deficiency anemia secondary to blood loss (chronic): Secondary | ICD-10-CM

## 2013-08-16 DIAGNOSIS — R5381 Other malaise: Secondary | ICD-10-CM | POA: Diagnosis not present

## 2013-08-16 DIAGNOSIS — N179 Acute kidney failure, unspecified: Secondary | ICD-10-CM | POA: Diagnosis present

## 2013-08-16 DIAGNOSIS — J90 Pleural effusion, not elsewhere classified: Secondary | ICD-10-CM | POA: Diagnosis not present

## 2013-08-16 DIAGNOSIS — E538 Deficiency of other specified B group vitamins: Secondary | ICD-10-CM | POA: Diagnosis present

## 2013-08-16 DIAGNOSIS — I509 Heart failure, unspecified: Secondary | ICD-10-CM | POA: Diagnosis present

## 2013-08-16 DIAGNOSIS — L509 Urticaria, unspecified: Secondary | ICD-10-CM

## 2013-08-16 DIAGNOSIS — Z9189 Other specified personal risk factors, not elsewhere classified: Secondary | ICD-10-CM

## 2013-08-16 DIAGNOSIS — E785 Hyperlipidemia, unspecified: Secondary | ICD-10-CM

## 2013-08-16 DIAGNOSIS — I70209 Unspecified atherosclerosis of native arteries of extremities, unspecified extremity: Secondary | ICD-10-CM | POA: Diagnosis present

## 2013-08-16 DIAGNOSIS — D509 Iron deficiency anemia, unspecified: Secondary | ICD-10-CM

## 2013-08-16 DIAGNOSIS — Z79899 Other long term (current) drug therapy: Secondary | ICD-10-CM

## 2013-08-16 DIAGNOSIS — J45909 Unspecified asthma, uncomplicated: Secondary | ICD-10-CM | POA: Diagnosis present

## 2013-08-16 DIAGNOSIS — Z23 Encounter for immunization: Secondary | ICD-10-CM | POA: Diagnosis not present

## 2013-08-16 DIAGNOSIS — K219 Gastro-esophageal reflux disease without esophagitis: Secondary | ICD-10-CM | POA: Diagnosis present

## 2013-08-16 DIAGNOSIS — Z7982 Long term (current) use of aspirin: Secondary | ICD-10-CM

## 2013-08-16 HISTORY — DX: Polyp of colon: K63.5

## 2013-08-16 LAB — COMPREHENSIVE METABOLIC PANEL
ALT: 8 U/L (ref 0–53)
AST: 12 U/L (ref 0–37)
Albumin: 3.4 g/dL — ABNORMAL LOW (ref 3.5–5.2)
Alkaline Phosphatase: 42 U/L (ref 39–117)
CO2: 18 mEq/L — ABNORMAL LOW (ref 19–32)
Calcium: 8.9 mg/dL (ref 8.4–10.5)
Creatinine, Ser: 1.66 mg/dL — ABNORMAL HIGH (ref 0.50–1.35)
GFR calc Af Amer: 46 mL/min — ABNORMAL LOW (ref >90)
GFR calc non Af Amer: 40 mL/min — ABNORMAL LOW (ref >90)
Glucose, Bld: 125 mg/dL — ABNORMAL HIGH (ref 70–99)
Potassium: 4.1 mEq/L (ref 3.5–5.1)
Sodium: 136 mEq/L (ref 135–145)

## 2013-08-16 LAB — POCT I-STAT TROPONIN I
Troponin i, poc: 0.02 ng/mL (ref 0.00–0.08)
Troponin i, poc: 0.01 ng/mL (ref 0.00–0.08)

## 2013-08-16 LAB — TROPONIN I: Troponin I: <0.3 ng/mL (ref <0.30)

## 2013-08-16 LAB — PRO B NATRIURETIC PEPTIDE: Pro B Natriuretic peptide (BNP): 1414 pg/mL — ABNORMAL HIGH (ref 0–125)

## 2013-08-16 LAB — CBC
Hemoglobin: 4.1 g/dL — CL (ref 13.0–17.0)
MCV: 79.2 fL (ref 78.0–100.0)
Platelets: 185 10*3/uL (ref 150–400)
RBC: 1.73 MIL/uL — ABNORMAL LOW (ref 4.22–5.81)
WBC: 10.5 10*3/uL (ref 4.0–10.5)

## 2013-08-16 LAB — PREPARE RBC (CROSSMATCH)

## 2013-08-16 MED ORDER — SODIUM CHLORIDE 0.9 % IV BOLUS (SEPSIS): 500.0000 mL | Freq: Once | INTRAVENOUS | Status: DC

## 2013-08-16 MED ORDER — ACETAMINOPHEN 650 MG RE SUPP: 650.0000 mg | Freq: Four times a day (QID) | RECTAL | Status: DC | PRN

## 2013-08-16 MED ORDER — ACETAMINOPHEN 325 MG PO TABS
650.0000 mg | ORAL_TABLET | Freq: Four times a day (QID) | ORAL | Status: DC | PRN
  Administered 2013-08-18: 650 mg via ORAL
  Filled 2013-08-16: qty 2

## 2013-08-16 MED ORDER — ALBUTEROL SULFATE HFA 108 (90 BASE) MCG/ACT IN AERS: 2.0000 | INHALATION_SPRAY | Freq: Four times a day (QID) | RESPIRATORY_TRACT | Status: DC | PRN

## 2013-08-16 MED ORDER — PANTOPRAZOLE SODIUM 40 MG PO TBEC
40.0000 mg | DELAYED_RELEASE_TABLET | Freq: Two times a day (BID) | ORAL | Status: DC
  Administered 2013-08-17: 40 mg via ORAL
  Filled 2013-08-16: qty 1

## 2013-08-16 MED ORDER — FUROSEMIDE 10 MG/ML IJ SOLN
20.0000 mg | INTRAMUSCULAR | Status: AC
  Administered 2013-08-16 – 2013-08-17 (×2): 20 mg via INTRAVENOUS

## 2013-08-16 MED ORDER — PANTOPRAZOLE SODIUM 40 MG IV SOLR: 40.0000 mg | Freq: Two times a day (BID) | INTRAVENOUS | Status: DC

## 2013-08-16 MED ORDER — ONDANSETRON HCL 4 MG/2ML IJ SOLN: 4.0000 mg | Freq: Four times a day (QID) | INTRAMUSCULAR | Status: DC | PRN

## 2013-08-16 MED ORDER — SODIUM CHLORIDE 0.9 % IV SOLN
8.0000 mg/h | INTRAVENOUS | Status: DC
  Administered 2013-08-16: 8 mg/h via INTRAVENOUS
  Filled 2013-08-16 (×2): qty 80

## 2013-08-16 MED ORDER — ONDANSETRON HCL 4 MG PO TABS: 4.0000 mg | ORAL_TABLET | Freq: Four times a day (QID) | ORAL | Status: DC | PRN

## 2013-08-16 MED ORDER — SODIUM CHLORIDE 0.9 % IV BOLUS (SEPSIS): 1000.0000 mL | INTRAVENOUS | Status: DC

## 2013-08-16 MED ORDER — SIMVASTATIN 20 MG PO TABS
20.0000 mg | ORAL_TABLET | Freq: Every day | ORAL | Status: DC
  Administered 2013-08-17 – 2013-08-18 (×2): 20 mg via ORAL
  Filled 2013-08-16 (×4): qty 1

## 2013-08-16 MED ORDER — METOPROLOL SUCCINATE 12.5 MG HALF TABLET
12.5000 mg | ORAL_TABLET | Freq: Every day | ORAL | Status: DC
  Administered 2013-08-17 – 2013-08-19 (×3): 12.5 mg via ORAL
  Filled 2013-08-16 (×3): qty 1

## 2013-08-16 MED ORDER — SODIUM CHLORIDE 0.9 % IV SOLN
80.0000 mg | Freq: Once | INTRAVENOUS | Status: AC
  Administered 2013-08-16: 20:00:00 80 mg via INTRAVENOUS
  Filled 2013-08-16: qty 80

## 2013-08-16 MED ORDER — FLUTICASONE PROPIONATE HFA 44 MCG/ACT IN AERO
1.0000 | INHALATION_SPRAY | Freq: Two times a day (BID) | RESPIRATORY_TRACT | Status: DC
  Administered 2013-08-17 – 2013-08-19 (×5): 1 via RESPIRATORY_TRACT
  Filled 2013-08-16: qty 10.6

## 2013-08-16 MED ORDER — SODIUM CHLORIDE 0.9 % IV SOLN
INTRAVENOUS | Status: DC
  Administered 2013-08-16: 10 mL via INTRAVENOUS

## 2013-08-16 NOTE — ED Notes (Signed)
Family at bedside. 

## 2013-08-16 NOTE — ED Notes (Signed)
Patient arrived to room from triage.

## 2013-08-16 NOTE — Progress Notes (Signed)
We have been notified of the GI consult- preliminary review of the chart reveals no acute GI blood loss, Hgb has drifted  From 13.7 05/2012 to 7.5 in 07/2013 to 4.1 today. He is up to date on his colonoscopy  (08/2012). Will likely need an EGD tomorrow. Renal insufficiency may be contributing to anemia. Full consult to follow'. Will keep NPO after MN.

## 2013-08-16 NOTE — H&P (Signed)
[facility] History and Physical  Earl Castillo Earl Castillo:[id number] DOB: 1940/11/13 DOA: 08/16/2013  Referring physician: Dr,. Romeo Apple  PCP: Nelwyn Salisbury, MD  Specialists: [facility]; Dr. Alanda Amass (cardiologist)  Chief Complaint: fatigue and weakness; low Hgb  HPI: Earl Castillo is a 72 y.o. male with pmh significant for CAD s/p CABG; GERD, HTN and HLD; came to ED secondary to increase weakness and fatigue. Patient reports symptoms has been present for the last 2 weeks and worsening. Patient denies CP, SOB, abd pain, nausea, vomiting, hematuria, hematochezia or hematemesis. Patient has been follow for the last 4 weeks with PCP and cardiologist and has had 3/3 FOBT positive and dropped on Hgb from 10.2 to 7.5 and on admission 4.1; 3 units of PRBC's has been ordered by ED physician and GI has been consult. [facility] called for admission.   Review of Systems:  Negative except as mentioned on HPI.  Past Medical History  Diagnosis Date  . Hyperlipidemia   . Hypertension   . Chicken pox   . Blood transfusion abn reaction or complication, no procedure mishap   . Coronary artery disease     sees Dr. Alanda Amass  . Osteoarthritis   . Nephrolithiasis   . Skin exam, screening for cancer     sees Dr. Girard Cooter   . Hemorrhoid   . GERD (gastroesophageal reflux disease)    Past Surgical History  Procedure Laterality Date  . Neck surgery  1978  . Bypass rt leg  2000  . Coronary artery bypass graft  2003  . Right carotid endarterectomy    . Basket extraction of ureteral stone  1970's  . Basal cell carcinoma excision  May 2009    per Dr. Ellen Henri , left nose   . Colonoscopy     Social History:  reports that he has been smoking Cigarettes.  He has been smoking about 0.00 packs per day for the past 57 years. He has never used smokeless tobacco. He reports that he does not drink alcohol or use illicit drugs.   No Known Allergies  Family History  Problem Relation Age of Onset  . Colon cancer  Neg Hx   . Heart disease Father     Prior to Admission medications   Medication Sig Start Date End Date Taking? Authorizing Provider  albuterol (PROVENTIL HFA;VENTOLIN HFA) 108 (90 BASE) MCG/ACT inhaler Inhale 2 puffs into the lungs every 6 (six) hours as needed for wheezing. 11/05/12  Yes Terressa Koyanagi, DO  aspirin 81 MG tablet Take 81 mg by mouth daily.   Yes Historical Provider, MD  beclomethasone (QVAR) 40 MCG/ACT inhaler Inhale 2 puffs into the lungs 2 (two) times daily as needed (shortness of breath).  11/05/12  Yes Terressa Koyanagi, DO  benazepril (LOTENSIN) 40 MG tablet Take 1 tablet (40 mg total) by mouth daily. 06/10/13  Yes Governor Rooks, MD  hydrochlorothiazide (HYDRODIURIL) 50 MG tablet Take 1 tablet (50 mg total) by mouth daily. 06/14/13  Yes Governor Rooks, MD  metoprolol (LOPRESSOR) 50 MG tablet Take 0.5 tablets (25 mg total) by mouth daily. 05/05/13  Yes Governor Rooks, MD  neomycin-colistin-hydrocortisone-thonzonium (CORTISPORIN TC) 3.01-04-09-0.5 MG/ML otic suspension Place 4 drops into the left ear 4 (four) times daily as needed (infection).   Yes Historical Provider, MD  NIFEdipine (PROCARDIA XL/ADALAT-CC) 90 MG 24 hr tablet Take 1 tablet (90 mg total) by mouth daily. 06/10/13  Yes Governor Rooks, MD  simvastatin (ZOCOR) 20 MG tablet Take  1 tablet (20 mg total) by mouth at bedtime. 06/10/13  Yes Governor Rooks, MD  pantoprazole (PROTONIX) 40 MG tablet Take 40 mg by mouth 2 (two) times daily.    Historical Provider, MD   Physical Exam: Filed Vitals:   08/16/13 1925  BP:   Pulse:   Temp: 97.9 F (36.6 C)  Resp:      General:  Pale, complaining of generalized fatigue; denies CP or SOB. Cooperative with examination  Eyes: no icterus, PERRL, no nystagmus, EOMI  ENT: MMM, no erythema or exudates; fair dentition; no drainage out of ears or nostrils  Neck: no JVD, no bruits, no thyromegaly  Cardiovascular: S1 and S2, positive SEM, no rubs, no  gallops  Respiratory: CTA bilaterally  Abdomen: soft, NT, ND, positive BS  Skin: pale, no rash or petechiae  Musculoskeletal: no joint swelling or erythema, FROM, no LE edema  Psychiatric: no SI, no hallucinations  Neurologic: AAOX3, CN intact, MS 4/5 bilaterally due to poor effort, no sensory deficit on exam.  Labs on Admission:  Basic Metabolic Panel:  Recent Labs Lab 08/16/13 1704  NA 136  K 4.1  CL 103  CO2 18*  GLUCOSE 125*  BUN 42*  CREATININE 1.66*  CALCIUM 8.9   Liver Function Tests:  Recent Labs Lab 08/16/13 1704  AST 12  ALT 8  ALKPHOS 42  BILITOT 0.2*  PROT 6.6  ALBUMIN 3.4*   CBC:  Recent Labs Lab 08/16/13 1704  WBC 10.5  HGB 4.1*  HCT 13.7*  MCV 79.2  PLT 185   BNP (last 3 results)  Recent Labs  08/16/13 1704  PROBNP 1414.0*    Radiological Exams on Admission: Dg Chest 2 View  08/16/2013   CLINICAL DATA:  Weakness  EXAM: CHEST  2 VIEW  COMPARISON:  03/24/2012  FINDINGS: Chronic interstitial markings/ emphysematous changes. Small right pleural effusion, new. No pneumothorax.  The heart is top-normal in size. Postsurgical changes related to prior CABG.  Degenerative changes of the visualized thoracolumbar spine.  IMPRESSION: Small right pleural effusion.   Electronically Signed   By: Charline Bills M.D.   On: 08/16/2013 18:22    EKG:  Slight new changes in comparison with EKG on 07/21/13; progression of T wave inversion and ST depression (infero-lateral leads)  Assessment/Plan 1-GI bleed: positive FOBT and dropped in Hgb from 10.7 to 4.1 in 4 weeks. -admit to step down -2 large bores IV -IV protonix -3 units or PRBC's -follow Hgb trend -hold ASA -GI consulted (will need EGD)  2-HYPERLIPIDEMIA: continue statins. Will check lipid panel  3-HYPERTENSION:stable. Will hold antihypertensive regimen except for low dose B-blocker to prevent rebound tachycardia.  4-CORONARY ARTERY DISEASE: mild ischemic changes appreciated on EKG;  but patient denies CP or SOB. Most likely related to anemia. -will check troponin -continue statins -continue B-blocker -ASA on hold given GI bleed -nitrates on hold due to soft BP  5-Fatigue: due to anemia most likely. Will evaluate with PT after Hgb stabilize.  6-Acute blood loss anemia: due to GI bleed. Will follow Hgb trend. Transfuse 3 units of PRBC's.  7-Grade 1 diastolic heart failure: no SOB, no crackles. -daily weight and close I's and O's -compensated and chronic. -Last echo (08/02/13) with preserved EF 55-60%  8-Asthma: continue PRN albuterol and flovent. No wheezing or SOB currently.  9-mild acute on CRF: stage 2 at baseline; currently due to anemia and decrease perfusion maybe. -will follow Cr trend  DVT: SCD's  GI (Dr. Juanda Chance)  Code Status:  Full Family Communication: wife at bedside Disposition Plan: stepdown, inpatient; LOS > 2 midnights  Time spent: 55 minutes  Loriana Samad [facility] Pager [phone]  If 7PM-7AM, please contact night-coverage [email or web address] Password [facility] 08/16/2013, 7:31 PM

## 2013-08-16 NOTE — ED Notes (Signed)
Called report to [employer], RN unit 3S.

## 2013-08-16 NOTE — Consult Note (Deleted)
[facility] Consult: 8:46 AM 08/17/2013   Referring Provider:  Vassie Loll    Primary Care Physician:  Nelwyn Salisbury, MD Primary Gastroenterologist:  Dr. Stan Head Cards:  Alanda Amass, pt will be establishing with Dr Allyson Sabal  Reason for Consultation:  Significant anemia, Hgb 4.1  HPI: Earl Castillo is a 72 y.o. male.  PMH of CAD s/p CABG, PVD s/p femoral artery BPG, GERD, HTN and HLD.  Adenomatous and hyperplastic colon polyps on 08/2012 colonoscopy.  Unremarkable Echo on 08/02/13 Presented to Dr Clent Ridges 08/2012 with >/= to 2 weeks of progressive weakness and fatigue.  Hgb clocked in at 4.1 which is down from 7.5 about 3.5 weeks ago and down about 9.5 grams in 3 months.  Iron, ferritin, B12 levels were low on 07/27/13.  3 stool cards were FOB + on 08/02/13.  These labs of late September were not addressed by ordering MD.  Pt went to see Dr Clent Ridges 10/13 c/o of the weakness.  Repeat labs showed further Hgb decline to 4.1 and pt sent to ED for admission/transfusion.  Stool is FOB + but pt has not had digestive troubles, nor melena or rectal bleeding.    Outpt med list includes BID Protonix and daily ASA 81 mg. However the PPI was started about 6 weeks ago and caused generalized bodyaches so pt stopped this.   Was not having any GI sxs:  No n/v, no abd pain.  No alterations in chronic pattern of loose stools for 1 to 2 days followed by no stools for a few days.  No dysphagia.  No BPR, no melena.  No NSAIDs.  No ETOH.  He endorses 10 days or so of anorexia as weakness progressed.  Wt can fluctuate by 5 # or so.  Weight has gone from 154 # to 147# in last few weeks.  No swelling, no angina.  + LE claudication:  Due to follow up with Dr Durwin Nora re adressing LE PVD.  Never had an EGD   Pt feeling much better after 3 units RBCs, weakness has resolved.      Past Medical History  Diagnosis Date  . Hyperlipidemia   . Hypertension   . Chicken pox   . Blood  transfusion abn reaction or complication, no procedure mishap   . Coronary artery disease     sees Dr. Alanda Amass  . Osteoarthritis   . Nephrolithiasis   . Skin exam, screening for cancer     sees Dr. Girard Cooter   . Hemorrhoid   . GERD (gastroesophageal reflux disease)   . Colon polyps 08/2012    Hyperplastic and adenomatous sessile polyps    Past Surgical History  Procedure Laterality Date  . Neck surgery  1978  . Bypass rt leg  2000  . Coronary artery bypass graft  2003  . Right carotid endarterectomy    . Basket extraction of ureteral stone  1970's  . Basal cell carcinoma excision  May 2009    per Dr. Ellen Henri , left nose   . Colonoscopy  2005, 08/2012    Prior to Admission medications   Medication Sig Start Date End Date Taking? Authorizing Provider  albuterol (PROVENTIL HFA;VENTOLIN HFA) 108 (90 BASE) MCG/ACT inhaler Inhale 2 puffs into the lungs every 6 (six) hours as needed for wheezing. 11/05/12  Yes Terressa Koyanagi, DO  aspirin 81 MG tablet Take 81 mg by mouth daily.   Yes Historical Provider, MD  beclomethasone (QVAR) 40 MCG/ACT inhaler Inhale 2 puffs into  the lungs 2 (two) times daily as needed (shortness of breath).  11/05/12  Yes Terressa Koyanagi, DO  benazepril (LOTENSIN) 40 MG tablet Take 1 tablet (40 mg total) by mouth daily. 06/10/13  Yes Governor Rooks, MD  hydrochlorothiazide (HYDRODIURIL) 50 MG tablet Take 1 tablet (50 mg total) by mouth daily. 06/14/13  Yes Governor Rooks, MD  metoprolol (LOPRESSOR) 50 MG tablet Take 0.5 tablets (25 mg total) by mouth daily. 05/05/13  Yes Governor Rooks, MD  neomycin-colistin-hydrocortisone-thonzonium (CORTISPORIN TC) 3.01-04-09-0.5 MG/ML otic suspension Place 4 drops into the left ear 4 (four) times daily as needed (infection).   Yes Historical Provider, MD  NIFEdipine (PROCARDIA XL/ADALAT-CC) 90 MG 24 hr tablet Take 1 tablet (90 mg total) by mouth daily. 06/10/13  Yes Governor Rooks, MD  simvastatin (ZOCOR) 20 MG tablet  Take 1 tablet (20 mg total) by mouth at bedtime. 06/10/13  Yes Governor Rooks, MD    Scheduled Meds: . fluticasone  1 puff Inhalation BID  . [START ON 08/18/2013] influenza vac split quadrivalent PF  0.5 mL Intramuscular Tomorrow-1000  . metoprolol succinate  12.5 mg Oral Daily  . pantoprazole  40 mg Oral BID  . [START ON 08/18/2013] pneumococcal 23 valent vaccine  0.5 mL Intramuscular Tomorrow-1000  . simvastatin  20 mg Oral QHS   Infusions: . sodium chloride 10 mL (08/16/13 2229)   PRN Meds: acetaminophen, acetaminophen, albuterol, ondansetron (ZOFRAN) IV, ondansetron   Allergies as of 08/16/2013  . (No Known Allergies)    Family History  Problem Relation Age of Onset  . Colon cancer Neg Hx   . Heart disease Father        Peptic ulcers and associated GI bleeding requring surgery in his Mom.   History   Social History  . Marital Status: Married    Spouse Name: N/A    Number of Children: N/A  . Years of Education: N/A   Occupational History  . Retired from working in a Designer, industrial/product for 30 years.  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 Smoking status: Says he quit early 08/2013. 1/2 pack/day for 57 years    Types: Cigarettes  . Smokeless tobacco: Never Used     Comment: smokes 3 a day or less  . Alcohol Use: No  . Drug Use: No  . Sexual Activity: Not on file    Social History Narrative   Daily caffeine     REVIEW OF SYSTEMS: Constitutional:  Per HPI ENT:  No nose bleeds, no congestion Pulm:  No cough, no dyspnea CV:  No angina.  + claudication GU:  No nocturia except last PM after doses of IV Lasix.  No blood in urine.  No scrotal edema GI:  Per HPI Heme:  Never had anemia before.    Transfusions:  None before 10/13 Neuro:  No headaches.  Eye sight great: had bil cataract surgery early summer 2014 over 2 week time period.  Derm:  No sores or rash or itching Endocrine:  No  excessive thirst, no sweats Immunization:  Flu and pneumovax shots scheduled for today.  Travel:  None beyond state of [place]   PHYSICAL EXAM: Vital signs in last 24 hours: Temp:  [97.5 F (36.4 C)-98.5 F (36.9 C)] 98.1 F (36.7 C) (10/14 0700) Pulse Rate:  [57-95] 63 (10/14 0540) Resp:  [16-27] 17 (10/14 0540) BP: (103-146)/(36-103) 131/43 mmHg (10/14 0540) SpO2:  [  92 %-100 %] 92 % (10/14 0540) Weight:  [66.679 kg (147 lb)-66.821 kg (147 lb 5 oz)] 66.821 kg (147 lb 5 oz) (10/13 1641)  General: very pleasant Head:  No swelling or asymmetry  Eyes:  No icterus or pallor Ears:  Slight HOH  Nose:  No discharge Mouth:  Clear and moist oral MM Neck:  No JVD, no mass Lungs:  Clear bil.  No cough.  No dyspnea Heart: RRR.  No MRG Abdomen:  Soft, ND, NT.  No mass or HSM.   Rectal: not repeated.   Musc/Skeltl: no joint swelling or contracture Extremities:  No CCE Heme:  Some small  Purpura on forearms  Neurologic:  No tremor, oriented x 3.  No deficits Skin:  No rash or sores Tattoos:  none Nodes:  No cervical adenopathy   Psych:  Pleasant, in good spirits.    Intake/Output from previous day: 10/13 0701 - 10/14 0700 In: 1550 [P.O.:240; I.V.:510; Blood:800] Out: 2100 [Urine:2100] Intake/Output this shift:    LAB RESULTS:  Recent Labs  08/16/13 1704 08/17/13 0620  WBC 10.5 8.6  HGB 4.1* 7.7*  HCT 13.7* 23.5*  PLT 185 141*   BMET Lab Results  Component Value Date   NA 136 08/16/2013   NA 141 07/21/2013   NA 139 05/13/2012   K 4.1 08/16/2013   K 4.7 07/21/2013   K 3.9 05/13/2012   CL 103 08/16/2013   CL 107 07/21/2013   CL 103 05/13/2012   CO2 18* 08/16/2013   CO2 26 07/21/2013   CO2 27 05/13/2012   GLUCOSE 125* 08/16/2013   GLUCOSE 98 07/21/2013   GLUCOSE 90 05/13/2012   BUN 42* 08/16/2013   BUN 24* 07/21/2013   BUN 22 05/13/2012   CREATININE 1.66* 08/16/2013   CREATININE 1.38* 07/21/2013   CREATININE 1.1 05/13/2012   CALCIUM 8.9 08/16/2013   CALCIUM 9.0 07/21/2013    CALCIUM 9.2 05/13/2012   LFT  Recent Labs  08/16/13 1704  PROT 6.6  ALBUMIN 3.4*  AST 12  ALT 8  ALKPHOS 42  BILITOT 0.2*   PT/INR No results found for this basename: INR,  PROTIME   RADIOLOGY STUDIES: Dg Chest 2 View 08/16/2013     IMPRESSION: Small right pleural effusion.   Electronically Signed   By: Charline Bills M.D.   On: 08/16/2013 18:22    ENDOSCOPIC STUDIES: 08/2012   Colonoscopy per dr Leone Payor For change in bowel habits 4 diminutive sesile polyps removed in descending and sigmoid colon. Small int "rrhoids.  Otherwise normal mucosa Pathology:  Fragment of Tubular Adenoma and Hyperplastic polyps.  Plan repeat colonoscopy in 08/2017.  02/2004  Colonoscopy Hemorrhoids    IMPRESSION: *  Acute on chronic normocytic anemia.  Iron and B12 deficiency by 07/27/13 studies. Hgb down 3.4 grams in just under one month, down ~9.5 grams in 3 months.Hgb and pt's clinical complaints improved following PRBCs x 3 units overnight.  Stool is FOB + but no overt melena or BPR.  Rule out PUD, rule out AVMs.  No sxs to suggest neoplasia.    *  Chronic kidney disease?  BUN/creatinine do not seem low enough to be causing renal induced anemia/epo deficiency  *  Negative cardiac enzymes x 4.    *  ASPVD of LE   PLAN: *  egd today at 1 PM. *  CBC and TSH in AM (last TSH was normal 05/2012) *  Stop PPI if possible, depending on EGD, as it caused body aches  in recent past. I dropped it to once from twice daily in the meantime *  b 12 shot series:  Once daily x 7 days, once weekly x 4 weeks, then once monthly thereafter per up to date. May stop therapy if cause for deficiency is eradicated. I placed the initial order.  *  Start po iron after endoscopic workup completed.    LOS: 1 day   Earl Castillo  08/17/2013, 8:46 AM Pager: [phone] Attending MD note:   I have reviewed the above note, examined the patient  , see EGD note- no evidence of bleeding lesion. Will need Capsule  endoscopy, but this will be done as an outpatient when he follows up with Dr Leone Payor, who may also consider repeating his colonoscopy. May discharge on oral Iron, B12 injections at Dr fry's office.  [facility] Pager # [phone]

## 2013-08-16 NOTE — ED Provider Notes (Signed)
CSN: [id number]     Arrival date & time 08/16/13  1635 History   First MD Initiated Contact with Patient 08/16/13 1703     Chief Complaint  Patient presents with  . Weakness   (Consider location/radiation/quality/duration/timing/severity/associated sxs/prior Treatment) Patient is a 72 y.o. male presenting with weakness. The history is provided by the patient.  Weakness This is a new problem. Episode onset: 2 weeks ago. The problem occurs constantly. The problem has not changed since onset.Associated symptoms include shortness of breath. Pertinent negatives include no chest pain, no abdominal pain and no headaches. The symptoms are aggravated by walking. Nothing relieves the symptoms. He has tried nothing for the symptoms. The treatment provided no relief.    Past Medical History  Diagnosis Date  . Hyperlipidemia   . Hypertension   . Chicken pox   . Blood transfusion abn reaction or complication, no procedure mishap   . Coronary artery disease     sees Dr. Alanda Amass  . Osteoarthritis   . Nephrolithiasis   . Skin exam, screening for cancer     sees Dr. Girard Cooter   . Hemorrhoid   . GERD (gastroesophageal reflux disease)    Past Surgical History  Procedure Laterality Date  . Neck surgery  1978  . Bypass rt leg  2000  . Coronary artery bypass graft  2003  . Right carotid endarterectomy    . Basket extraction of ureteral stone  1970's  . Basal cell carcinoma excision  May 2009    per Dr. Ellen Henri , left nose   . Colonoscopy     Family History  Problem Relation Age of Onset  . Colon cancer Neg Hx   . Heart disease Father    History  Substance Use Topics  . Smoking status: Current Every Day Smoker -- 57 years    Types: Cigarettes  . Smokeless tobacco: Never Used     Comment: smokes 3 a day or less  . Alcohol Use: No    Review of Systems  Constitutional: Positive for fatigue. Negative for fever.  HENT: Negative for drooling and rhinorrhea.   Eyes: Negative for  pain.  Respiratory: Positive for shortness of breath. Negative for cough.   Cardiovascular: Negative for chest pain and leg swelling.  Gastrointestinal: Negative for nausea, vomiting, abdominal pain and diarrhea.  Genitourinary: Negative for dysuria and hematuria.  Musculoskeletal: Negative for gait problem and neck pain.  Skin: Negative for color change.  Neurological: Positive for weakness. Negative for numbness and headaches.  Hematological: Negative for adenopathy.  Psychiatric/Behavioral: Negative for behavioral problems.  All other systems reviewed and are negative.    Allergies  Review of patient's allergies indicates no known allergies.  Home Medications   Current Outpatient Rx  Name  Route  Sig  Dispense  Refill  . albuterol (PROVENTIL HFA;VENTOLIN HFA) 108 (90 BASE) MCG/ACT inhaler   Inhalation   Inhale 2 puffs into the lungs every 6 (six) hours as needed for wheezing.   1 Inhaler   0   . aspirin 81 MG tablet   Oral   Take 81 mg by mouth daily.         . beclomethasone (QVAR) 40 MCG/ACT inhaler   Inhalation   Inhale 2 puffs into the lungs 2 (two) times daily as needed (shortness of breath).          . benazepril (LOTENSIN) 40 MG tablet   Oral   Take 1 tablet (40 mg total) by mouth daily.  90 tablet   2   . hydrochlorothiazide (HYDRODIURIL) 50 MG tablet   Oral   Take 1 tablet (50 mg total) by mouth daily.   90 tablet   2   . metoprolol (LOPRESSOR) 50 MG tablet   Oral   Take 0.5 tablets (25 mg total) by mouth daily.   15 tablet   8     Refill on 6.26.14 for #90 corrected.   . neomycin-colistin-hydrocortisone-thonzonium (CORTISPORIN TC) 3.01-04-09-0.5 MG/ML otic suspension   Left Ear   Place 4 drops into the left ear 4 (four) times daily as needed (infection).         Marland Kitchen NIFEdipine (PROCARDIA XL/ADALAT-CC) 90 MG 24 hr tablet   Oral   Take 1 tablet (90 mg total) by mouth daily.   90 tablet   2   . simvastatin (ZOCOR) 20 MG tablet   Oral    Take 1 tablet (20 mg total) by mouth at bedtime.   90 tablet   2   . pantoprazole (PROTONIX) 40 MG tablet   Oral   Take 40 mg by mouth 2 (two) times daily.          BP 113/61  Pulse 65  Temp(Src) 97.5 F (36.4 C) (Oral)  Resp 26  Wt 147 lb 5 oz (66.821 kg)  BMI 24.13 kg/m2  SpO2 95% Physical Exam  Nursing note and vitals reviewed. Constitutional: He is oriented to person, place, and time. He appears well-developed and well-nourished.  Pale appearing  HENT:  Head: Normocephalic and atraumatic.  Right Ear: External ear normal.  Left Ear: External ear normal.  Nose: Nose normal.  Mouth/Throat: Oropharynx is clear and moist. No oropharyngeal exudate.  Eyes: Conjunctivae and EOM are normal. Pupils are equal, round, and reactive to light.  Neck: Normal range of motion. Neck supple.  Cardiovascular: Normal rate, regular rhythm, normal heart sounds and intact distal pulses.  Exam reveals no gallop and no friction rub.   No murmur heard. Pulmonary/Chest: Effort normal and breath sounds normal. No respiratory distress. He has no wheezes.  Abdominal: Soft. Bowel sounds are normal. He exhibits no distension. There is no tenderness. There is no rebound and no guarding.  Musculoskeletal: Normal range of motion. He exhibits tenderness (very mild bilateral paraspinal ttp of lower lumbar area). He exhibits no edema.  Neurological: He is alert and oriented to person, place, and time.  Skin: Skin is warm and dry.  Psychiatric: He has a normal mood and affect. His behavior is normal.    ED Course  Procedures (including critical care time) Labs Review Labs Reviewed  CBC - Abnormal; Notable for the following:    RBC 1.73 (*)    Hemoglobin 4.1 (*)    HCT 13.7 (*)    MCH 23.7 (*)    MCHC 29.9 (*)    RDW 16.6 (*)    All other components within normal limits  COMPREHENSIVE METABOLIC PANEL - Abnormal; Notable for the following:    CO2 18 (*)    Glucose, Bld 125 (*)    BUN 42 (*)     Creatinine, Ser 1.66 (*)    Albumin 3.4 (*)    Total Bilirubin 0.2 (*)    GFR calc non Af Amer 40 (*)    GFR calc Af Amer 46 (*)    All other components within normal limits  PRO B NATRIURETIC PEPTIDE - Abnormal; Notable for the following:    Pro B Natriuretic peptide (BNP) 1414.0 (*)  All other components within normal limits  LACTIC ACID, PLASMA - Abnormal; Notable for the following:    Lactic Acid, Venous 3.1 (*)    All other components within normal limits  CBC - Abnormal; Notable for the following:    RBC 3.00 (*)    Hemoglobin 7.7 (*)    HCT 23.5 (*)    MCH 25.7 (*)    Platelets 141 (*)    All other components within normal limits  OCCULT BLOOD, POC DEVICE - Abnormal; Notable for the following:    Fecal Occult Bld POSITIVE (*)    All other components within normal limits  MRSA PCR SCREENING  TROPONIN I  TROPONIN I  TROPONIN I  POCT I-STAT TROPONIN I  POCT I-STAT TROPONIN I  TYPE AND SCREEN  PREPARE RBC (CROSSMATCH)  ABO/RH   Imaging Review Dg Chest 2 View  08/16/2013   CLINICAL DATA:  Weakness  EXAM: CHEST  2 VIEW  COMPARISON:  03/24/2012  FINDINGS: Chronic interstitial markings/ emphysematous changes. Small right pleural effusion, new. No pneumothorax.  The heart is top-normal in size. Postsurgical changes related to prior CABG.  Degenerative changes of the visualized thoracolumbar spine.  IMPRESSION: Small right pleural effusion.   Electronically Signed   By: Charline Bills M.D.   On: 08/16/2013 18:22    EKG Interpretation     Ventricular Rate:  59 PR Interval:  144 QRS Duration: 90 QT Interval:  438 QTC Calculation: 433 R Axis:   35 Text Interpretation:  Sinus bradycardia ST \\T \ T wave abnormality, consider inferolateral ischemia Abnormal ECG New ST and T wave changes in V4-V6, also II, III, aVF likely consistent with ischemia           CRITICAL CARE Performed by: Purvis Sheffield, S Total critical care time: 30 min Critical care time was  exclusive of separately billable procedures and treating other patients. Critical care was necessary to treat or prevent imminent or life-threatening deterioration. Critical care was time spent personally by me on the following activities: development of treatment plan with patient and/or surrogate as well as nursing, discussions with consultants, evaluation of patient's response to treatment, examination of patient, obtaining history from patient or surrogate, ordering and performing treatments and interventions, ordering and review of laboratory studies, ordering and review of radiographic studies, pulse oximetry and re-evaluation of patient's condition.  MDM   1. GI bleed   2. Anemia   3. Fatigue   4. Acute blood loss anemia   5. Coronary atherosclerosis of unspecified type of vessel, native or graft   6. GERD (gastroesophageal reflux disease)    5:33 PM 72 y.o. male who presents with fatigue and shortness of breath for the last 2 weeks. Of note the patient has had occult positive stools by his PCP. He was found to have a hemoglobin of 7.5 and sent to the ER due to worsening fatigue and shortness of breath. The patient is afebrile and his vital signs are unremarkable here. He is found to have a hemoglobin of 4.1 here in the ER today. I discussed the case with [facility] who will see the patient. He is also noted to have ischemic changes on his EKG, likely demand from profound anemia. Will get labs, IV fluid, consent for blood. Will order 3 U.   Consulted hospitalist for admission.   Critical care time included repeat evaluations, chart review, discussions with family, blood transfusion for profound anemia, consultation w/ GI and hospitalist.    Junius Argyle,  MD 08/17/13 1224

## 2013-08-16 NOTE — ED Notes (Signed)
Pt states sent here to have blood transfusion for low hemoglobin.  Pt here with weakness and tired.  Pale at triage.  Pt was told that his hgb was 7.5.  Pt has pain in neck, shoulders, back.  Pt states with the pain he gets sob.  Pt denies any chest pain

## 2013-08-16 NOTE — Progress Notes (Signed)
  Subjective:    Patient ID: Earl Castillo, male    DOB: [birth year], 72 y.o.   MRN: [id number]  HPI Here with his wife with a lot of questions. He had been seeing Dr. Alanda Amass for Cardiology care until his retirement recently. Earl Castillo was to have been assigned another cardiologist but he does not know if this has been done or not. He was being worked up for peripheral vascular disease and he has had some tests done lately that Dr. Alanda Amass had ordered. This included an ECHO on 08-02-13 which was unremarkable, and he had lower extremity arterial dopplers on 08-11-13 which showed his femoral grafts to be patent and his right lower leg to be patent. However the left lower leg showed >50% stenoses. He has been scheduled to see Dr. Edilia Bo [facility] on 09-22-13. The main problem is that his labs on 07-21-13 showed a Hgb of 7.5 (this was a dramatic drop from his baseline of 13.7 in July 2013). Further workup revealed a low B12 of 182, a low serum iron of 34 and a severely low ferritin of 6. His stool cards were positive for heme. No one from the cardiology office has gotten back with him about any of these results. He feels extremely fatigued lately and achy all over his body, but he denies chest pain or SOB.    Review of Systems  Constitutional: Positive for fatigue.  Respiratory: Negative.   Cardiovascular: Negative.   Gastrointestinal: Negative.   Neurological: Negative.        Objective:   Physical Exam  Constitutional: He is oriented to person, place, and time. He appears well-developed and well-nourished. No distress.  Cardiovascular: Normal rate, regular rhythm, normal heart sounds and intact distal pulses.   Pulmonary/Chest: Effort normal and breath sounds normal.  Abdominal: Soft. Bowel sounds are normal. He exhibits no distension and no mass. There is no tenderness. There is no rebound and no guarding.  Neurological: He is alert and oriented to person, place, and time.           Assessment & Plan:  Severe anemia from a likely GI source of bleeding. He needs to be transfused blood ASAP so his wife will drive him to [facility] ER now to be admitted. Once there they can consult [facility] to work up his GI tract.

## 2013-08-17 ENCOUNTER — Encounter (HOSPITAL_COMMUNITY)
Admission: EM | Disposition: A | Discharge status: Home / Self Care | Payer: Self-pay | Source: Home / Self Care | Attending: Internal Medicine

## 2013-08-17 ENCOUNTER — Encounter (HOSPITAL_COMMUNITY): Payer: Self-pay | Admitting: Physician Assistant

## 2013-08-17 ENCOUNTER — Telehealth: Payer: Self-pay | Admitting: Family Medicine

## 2013-08-17 DIAGNOSIS — E538 Deficiency of other specified B group vitamins: Secondary | ICD-10-CM | POA: Diagnosis not present

## 2013-08-17 DIAGNOSIS — N179 Acute kidney failure, unspecified: Secondary | ICD-10-CM | POA: Diagnosis not present

## 2013-08-17 DIAGNOSIS — D509 Iron deficiency anemia, unspecified: Secondary | ICD-10-CM | POA: Diagnosis not present

## 2013-08-17 DIAGNOSIS — D62 Acute posthemorrhagic anemia: Secondary | ICD-10-CM

## 2013-08-17 DIAGNOSIS — D500 Iron deficiency anemia secondary to blood loss (chronic): Secondary | ICD-10-CM

## 2013-08-17 DIAGNOSIS — I10 Essential (primary) hypertension: Secondary | ICD-10-CM

## 2013-08-17 DIAGNOSIS — I2510 Atherosclerotic heart disease of native coronary artery without angina pectoris: Secondary | ICD-10-CM | POA: Diagnosis not present

## 2013-08-17 DIAGNOSIS — K922 Gastrointestinal hemorrhage, unspecified: Secondary | ICD-10-CM | POA: Diagnosis not present

## 2013-08-17 HISTORY — PX: ESOPHAGOGASTRODUODENOSCOPY: SHX5428

## 2013-08-17 HISTORY — DX: Iron deficiency anemia secondary to blood loss (chronic): D50.0

## 2013-08-17 LAB — CBC
Hemoglobin: 7.7 g/dL — ABNORMAL LOW (ref 13.0–17.0)
Platelets: 141 10*3/uL — ABNORMAL LOW (ref 150–400)
RBC: 3 MIL/uL — ABNORMAL LOW (ref 4.22–5.81)
WBC: 8.6 10*3/uL (ref 4.0–10.5)

## 2013-08-17 LAB — MRSA PCR SCREENING: MRSA by PCR: NEGATIVE

## 2013-08-17 LAB — TROPONIN I
Troponin I: <0.3 ng/mL (ref <0.30)
Troponin I: <0.3 ng/mL (ref <0.30)

## 2013-08-17 SURGERY — EGD (ESOPHAGOGASTRODUODENOSCOPY)
Anesthesia: Moderate Sedation

## 2013-08-17 MED ORDER — MIDAZOLAM HCL 10 MG/2ML IJ SOLN
INTRAMUSCULAR | Status: DC | PRN
  Administered 2013-08-17: 1 mg via INTRAVENOUS
  Administered 2013-08-17: 2 mg via INTRAVENOUS

## 2013-08-17 MED ORDER — BUTAMBEN-TETRACAINE-BENZOCAINE 2-2-14 % EX AERO
INHALATION_SPRAY | CUTANEOUS | Status: DC | PRN
  Administered 2013-08-17: 2 via TOPICAL

## 2013-08-17 MED ORDER — FENTANYL CITRATE 0.05 MG/ML IJ SOLN
INTRAMUSCULAR | Status: AC
  Filled 2013-08-17: qty 2

## 2013-08-17 MED ORDER — SODIUM CHLORIDE 0.9 % IV SOLN
25.0000 mg | Freq: Once | INTRAVENOUS | Status: AC
  Administered 2013-08-17: 25 mg via INTRAVENOUS
  Filled 2013-08-17: qty 0.5

## 2013-08-17 MED ORDER — FUROSEMIDE 10 MG/ML IJ SOLN
INTRAMUSCULAR | Status: AC
  Filled 2013-08-17: qty 4

## 2013-08-17 MED ORDER — CYANOCOBALAMIN 1000 MCG/ML IJ SOLN
1000.0000 ug | Freq: Every day | INTRAMUSCULAR | Status: DC
  Administered 2013-08-18: 1000 ug via INTRAMUSCULAR
  Filled 2013-08-17 (×3): qty 1

## 2013-08-17 MED ORDER — INFLUENZA VAC SPLIT QUAD 0.5 ML IM SUSP
0.5000 mL | INTRAMUSCULAR | Status: AC
Start: 2013-08-18 — End: 2013-08-18
  Administered 2013-08-18: 0.5 mL via INTRAMUSCULAR
  Filled 2013-08-17: qty 0.5

## 2013-08-17 MED ORDER — PANTOPRAZOLE SODIUM 40 MG PO TBEC: 40.0000 mg | DELAYED_RELEASE_TABLET | Freq: Every day | ORAL | Status: DC

## 2013-08-17 MED ORDER — PNEUMOCOCCAL VAC POLYVALENT 25 MCG/0.5ML IJ INJ
0.5000 mL | INJECTION | INTRAMUSCULAR | Status: AC
  Administered 2013-08-18: 0.5 mL via INTRAMUSCULAR
  Filled 2013-08-17: qty 0.5

## 2013-08-17 MED ORDER — MIDAZOLAM HCL 5 MG/ML IJ SOLN
INTRAMUSCULAR | Status: AC
  Filled 2013-08-17: qty 2

## 2013-08-17 MED ORDER — SODIUM CHLORIDE 0.9 % IV SOLN
1000.0000 mg | Freq: Once | INTRAVENOUS | Status: AC
  Administered 2013-08-17: 1000 mg via INTRAVENOUS
  Filled 2013-08-17 (×2): qty 20

## 2013-08-17 MED ORDER — FENTANYL CITRATE 0.05 MG/ML IJ SOLN
INTRAMUSCULAR | Status: DC | PRN
  Administered 2013-08-17: 25 ug via INTRAVENOUS

## 2013-08-17 MED ORDER — SODIUM CHLORIDE 0.9 % IV SOLN
INTRAVENOUS | Status: DC
  Administered 2013-08-17: 500 mL via INTRAVENOUS

## 2013-08-17 NOTE — Progress Notes (Signed)
Pt arrived back from ENDO, VSS, family at bedside,  New orders placed while in ENDO.  Will carry them out and will continue to monitor.

## 2013-08-17 NOTE — Progress Notes (Signed)
PATIENT DETAILS Name: Earl Castillo Age: 72 y.o. Sex: male Date of Birth: 26-Sep-1941 Admit Date: 08/16/2013 Admitting Physician No admitting provider for patient encounter. PCP:FRY,STEPHEN A, MD  Subjective: No major complaints  Assessment/Plan: Principal Problem:   GI bleed -presumed upper GI bleed -EGD today -on PPI -GI following  Anemia -?acute blood loss -s/p PRBC transfusion -Hb increased to 7.7-up from 4.1 on admission  CORONARY ARTERY DISEASE -EKG changed present on admission-resolved after 3 units PRBC transfusion -ASA on hold -c/w Metoprolol,and Zocor  Mild acute on CRF: -stage 2 at baseline; currently due to anemia and decrease perfusion maybe.  -will follow Cr trend  Diastolic heart failure -no SOB, no crackles.  -daily weight and close I's and O's  -compensated and chronic.  -Last echo (08/02/13) with preserved EF 55-60%  Asthma:  -continue PRN albuterol and flovent. No wheezing or SOB currently.  Disposition: Remain inpatient  DVT Prophylaxis:  SCD's  Code Status: Full code   Family Communication None at bedside  Procedures:  None  CONSULTS:  GI   MEDICATIONS: Scheduled Meds: . cyanocobalamin  1,000 mcg Intramuscular Daily  . fluticasone  1 puff Inhalation BID  . [START ON 08/18/2013] influenza vac split quadrivalent PF  0.5 mL Intramuscular Tomorrow-1000  . metoprolol succinate  12.5 mg Oral Daily  . [START ON 08/18/2013] pantoprazole  40 mg Oral Daily  . [START ON 08/18/2013] pneumococcal 23 valent vaccine  0.5 mL Intramuscular Tomorrow-1000  . simvastatin  20 mg Oral QHS   Continuous Infusions: . sodium chloride    . sodium chloride 10 mL (08/16/13 2229)   PRN Meds:.acetaminophen, acetaminophen, albuterol, ondansetron (ZOFRAN) IV, ondansetron  Antibiotics: Anti-infectives   None       PHYSICAL EXAM: Vital signs in last 24 hours: Filed Vitals:   08/17/13 0525 08/17/13 0540 08/17/13 0700 08/17/13 0726  BP: 115/52  131/43  124/45  Pulse: 57 63    Temp: 98 F (36.7 C) 98.1 F (36.7 C) 98.1 F (36.7 C)   TempSrc:  Oral Oral   Resp: 19 17    Height:      Weight:      SpO2: 92% 92%      Weight change:  Filed Weights   08/16/13 1641  Weight: 66.821 kg (147 lb 5 oz)   Body mass index is 23.79 kg/(m^2).   Gen Exam: Awake and alert with clear speech.  Neck: Supple, No JVD.   Chest: B/L Clear.   CVS: S1 S2 Regular, no murmurs.  Abdomen: soft, BS +, non tender, non distended.  Extremities: no edema, lower extremities warm to touch. Neurologic: Non Focal.   Skin: No Rash.   Wounds: N/A.    Intake/Output from previous day:  Intake/Output Summary (Last 24 hours) at 08/17/13 1129 Last data filed at 08/17/13 1045  Gross per 24 hour  Intake   1550 ml  Output   2250 ml  Net   -700 ml     LAB RESULTS: CBC  Recent Labs Lab 08/16/13 1704 08/17/13 0620  WBC 10.5 8.6  HGB 4.1* 7.7*  HCT 13.7* 23.5*  PLT 185 141*  MCV 79.2 78.3  MCH 23.7* 25.7*  MCHC 29.9* 32.8  RDW 16.6* 15.2    Chemistries   Recent Labs Lab 08/16/13 1704  NA 136  K 4.1  CL 103  CO2 18*  GLUCOSE 125*  BUN 42*  CREATININE 1.66*  CALCIUM 8.9    CBG: No results found for this basename: GLUCAP,  in  the last 168 hours  GFR Estimated Creatinine Clearance: 36.3 ml/min (by C-G formula based on Cr of 1.66).  Coagulation profile No results found for this basename: INR, PROTIME,  in the last 168 hours  Cardiac Enzymes  Recent Labs Lab 08/16/13 2305 08/17/13 0620  TROPONINI <0.30 <0.30    No components found with this basename: POCBNP,  No results found for this basename: DDIMER,  in the last 72 hours No results found for this basename: HGBA1C,  in the last 72 hours No results found for this basename: CHOL, HDL, LDLCALC, TRIG, CHOLHDL, LDLDIRECT,  in the last 72 hours No results found for this basename: TSH, T4TOTAL, FREET3, T3FREE, THYROIDAB,  in the last 72 hours No results found for this  basename: VITAMINB12, FOLATE, FERRITIN, TIBC, IRON, RETICCTPCT,  in the last 72 hours No results found for this basename: LIPASE, AMYLASE,  in the last 72 hours  Urine Studies No results found for this basename: UACOL, UAPR, USPG, UPH, UTP, UGL, UKET, UBIL, UHGB, UNIT, UROB, ULEU, UEPI, UWBC, URBC, UBAC, CAST, CRYS, UCOM, BILUA,  in the last 72 hours  MICROBIOLOGY: Recent Results (from the past 240 hour(s))  MRSA PCR SCREENING     Status: None   Collection Time    08/16/13 11:36 PM      Result Value Range Status   MRSA by PCR NEGATIVE  NEGATIVE Final   Comment:            The GeneXpert MRSA Assay (FDA     approved for NASAL specimens     only), is one component of a     comprehensive MRSA colonization     surveillance program. It is not     intended to diagnose MRSA     infection nor to guide or     monitor treatment for     MRSA infections.    RADIOLOGY STUDIES/RESULTS: Dg Chest 2 View  08/16/2013   CLINICAL DATA:  Weakness  EXAM: CHEST  2 VIEW  COMPARISON:  03/24/2012  FINDINGS: Chronic interstitial markings/ emphysematous changes. Small right pleural effusion, new. No pneumothorax.  The heart is top-normal in size. Postsurgical changes related to prior CABG.  Degenerative changes of the visualized thoracolumbar spine.  IMPRESSION: Small right pleural effusion.   Electronically Signed   By: Charline Bills M.D.   On: 08/16/2013 18:22    Jeoffrey Massed, MD  [facility] Pager:[phone]  If 7PM-7AM, please contact night-coverage [email or web address] Password [email or web address] 08/17/2013, 11:29 AM   LOS: 1 day

## 2013-08-17 NOTE — Progress Notes (Signed)
Consent signed and placed in front of chart.  ?

## 2013-08-17 NOTE — Op Note (Signed)
[facility] [address] [place], [name], Castillo  MR#: [id number] BIRTHDATE: 1941/01/27 , 72  yrs. old GENDER: Male ENDOSCOPIST: Earl Carwin, MD REFERRED BY:  Earl Castillo, M.D. [facility] PROCEDURE DATE:  08/17/2013 PROCEDURE:  EGD w/ biopsy ASA CLASS:     Class III INDICATIONS:  Iron deficiency anemia. MEDICATIONS: These medications were titrated to patient response per physician's verbal order, Fentanyl 50 mcg IV, and Versed 3 mg IV TOPICAL ANESTHETIC: Cetacaine Spray  DESCRIPTION OF PROCEDURE: After the risks benefits and alternatives of the procedure were thoroughly explained, informed consent was obtained.  The Pentax Gastroscope [id number] endoscope was introduced through the mouth and advanced to the second portion of the duodenum. Without limitations.  The instrument was slowly withdrawn as the mucosa was fully examined.      Esophagus: proximal, and mid esophageal mucosa appeared normal. There was no retained food or dilatation of the esophageal lumen. The Z line was irregular. Multiple biopsies were taken to rule out Barrett's esophagus Stomach: There was a small 1 cm sliding hiatal hernia. There was no stricture. Gastric folds were unremarkable. There were no AV malformations. Retroflexion of the endoscope revealed normal fundus and cardia, gastric outlet was normal; the duodenal bulb and descending duodenum was normal. Multiple biopsies were taken from second portion duodenum to rule out villous atrophy  Duodenum[          The scope was then withdrawn from the patient and the procedure completed.  COMPLICATIONS: There were no complications. ENDOSCOPIC IMPRESSION:  irregular Z line status post biopsies status post the biopsies from the descending duodenum to rule out villous atrophy nothing to account for GI blood loss RECOMMENDATIONS:  resume diet. Patient will  benefit from intravenous iron infusion Low stool Hemoccults and H&H Consider small bowel capsule endoscopy. This could be done as an outpatient Oral iron supplements B12 supplements check sprue profile to r/o iron malabsorption   follow up Earl Earl Castillo REPEAT EXAM: for EGD pending biopsy results.  eSigned:  Hart Carwin, MD 08/17/2013 3:35 PM   CC:  PATIENT NAME:  Earl, Castillo MR#: [id number]

## 2013-08-17 NOTE — Progress Notes (Signed)
Utilization review completed.  

## 2013-08-17 NOTE — Telephone Encounter (Signed)
Brett Canales, PA at [facility] requesting lab order for CBC, pt should be d/c from hospital on tomorrow and needs to have CBC checked.

## 2013-08-17 NOTE — Telephone Encounter (Signed)
Pls advise.  

## 2013-08-17 NOTE — Plan of Care (Signed)
Problem: Phase I Progression Outcomes Goal: Pain controlled with appropriate interventions Outcome: Progressing Pain denies pain at this time will continue to monitor Goal: OOB as tolerated unless otherwise ordered Outcome: Progressing Bedrest with bathroom privileges  Goal: Voiding-avoid urinary catheter unless indicated Outcome: Progressing Pt voiding clear yellow urine Goal: Hemodynamically stable Outcome: Progressing VSS receiving blood will continue to monitor

## 2013-08-18 ENCOUNTER — Encounter (HOSPITAL_COMMUNITY): Payer: Self-pay | Admitting: Internal Medicine

## 2013-08-18 ENCOUNTER — Encounter (HOSPITAL_COMMUNITY)
Admission: EM | Disposition: A | Discharge status: Home / Self Care | Payer: Self-pay | Source: Home / Self Care | Attending: Internal Medicine

## 2013-08-18 DIAGNOSIS — K922 Gastrointestinal hemorrhage, unspecified: Secondary | ICD-10-CM | POA: Diagnosis not present

## 2013-08-18 DIAGNOSIS — D649 Anemia, unspecified: Secondary | ICD-10-CM | POA: Diagnosis not present

## 2013-08-18 LAB — TSH: TSH: 0.922 u[IU]/mL (ref 0.350–4.500)

## 2013-08-18 LAB — BASIC METABOLIC PANEL
CO2: 22 mEq/L (ref 19–32)
Chloride: 103 mEq/L (ref 96–112)
Creatinine, Ser: 1.28 mg/dL (ref 0.50–1.35)
GFR calc Af Amer: 63 mL/min — ABNORMAL LOW (ref >90)
Glucose, Bld: 130 mg/dL — ABNORMAL HIGH (ref 70–99)
Sodium: 136 mEq/L (ref 135–145)

## 2013-08-18 LAB — TISSUE TRANSGLUTAMINASE, IGA: Tissue Transglutaminase Ab, IgA: 8.2 U/mL (ref <20)

## 2013-08-18 LAB — GLIADIN ANTIBODIES, SERUM
Gliadin IgA: 6 U/mL (ref <20)
Gliadin IgG: 3.8 U/mL (ref <20)

## 2013-08-18 LAB — PREPARE RBC (CROSSMATCH)

## 2013-08-18 SURGERY — EGD (ESOPHAGOGASTRODUODENOSCOPY)
Anesthesia: Moderate Sedation

## 2013-08-18 MED ORDER — CYANOCOBALAMIN 1000 MCG/ML IJ SOLN
1000.0000 ug | Freq: Every day | INTRAMUSCULAR | Status: DC
  Administered 2013-08-19: 1000 ug via SUBCUTANEOUS
  Filled 2013-08-18: qty 1

## 2013-08-18 NOTE — Progress Notes (Addendum)
[facility] Progress Note [place] TEAM 1 - Stepdown/ICU TEAM   Earl Castillo NWG:[id number] DOB: August 27, 1941 DOA: 08/16/2013 PCP: Nelwyn Salisbury, MD  Admit HPI / Brief Narrative: 72 y.o. male with pmh significant for CAD s/p CABG; GERD, HTN and HLD; came to ED secondary to increased weakness and fatigue. Patient reported symptoms had been present for 2 weeks and worsening. Patient denied CP, SOB, abd pain, nausea, vomiting, hematuria, hematochezia or hematemesis. Patient had been followed for the preceding 4 weeks with his PCP and Cardiologist and had 3/3 FOBT positive and a drop in Hgb from 10.2 to 7.5.  Hgb was noted to be 4.1 at time of admission.  3 units of PRBC's were ordered by ED physician and GI was consulted.  Assessment/Plan:  GI bleed FOB + but pt has not had melena or rectal bleeding - drop in Hgb from 10.7 to 4.1 in 4 weeks - EGD unrevealing as to the source of bleeding - hemoglobin has improved as expected with transfusions - plan per GI as to pursue outpatient capsule endoscopy   Acute blood loss anemia - severe Status post 3 units packed red blood cells thus far - given history of coronary artery disease our target hemoglobin should be 8.0 or greater - will transfuse one additional unit of packed red blood cells today and recheck CBC in a.m. - if hemoglobin falls/does not improve as expected may need to consider inpatient capsule endoscopy  Severe B12 deficiency  B12 was noted to be <200 - pt is being loaded with SQ B12 - recheck will need to be accomplished in the oupt setting   Adenomatous and hyperplastic colon polyps on 08/2012 colonoscopy  Coronary artery disease status post CABG  Asymptomatic at present - as noted above hemoglobin goal needs to be 8.0 or greater  Chronic renal failure Stage 2 with mild acute renal injury Renal function has returned to the patient's baseline - acute renal injury likely simply prerenal exacerbation due to severe  anemia  Diastolic congestive heart failure - grade 1 Well compensated at the present time  Hypertension Well-controlled at this time  Hyperlipidemia Continue home medical therapy  Reported history of asthma Well compensated at this time  PVD s/p femoral artery BPG  Code Status: FULL Family Communication: Spoke at length with the patient and wife at bedside Disposition Plan: Remain in step down unit with possible discharge home in a.m. if hemoglobin greater than 8 and no evidence of bleeding  Consultants: Gastroenterology   Procedures: 10/14 - EGD - irregular Z line status post biopsies - status post the biopsies from the descending duodenum to rule out villous atrophy nothing to account for GI blood loss  Antibiotics: None   DVT prophylaxis: SCDs  HPI/Subjective: Patient has no complaints whatsoever.  He still has not noticed any evidence of gross GI blood loss.  He denies chest pain fevers chills shortness of breath nausea or vomiting.  Objective: Blood pressure 120/50, pulse 60, temperature 97.9 F (36.6 C), temperature source Oral, resp. rate 18, height 5\' 6"  (1.676 m), weight 67.2 kg (148 lb 2.4 oz), SpO2 97.00%.  Intake/Output Summary (Last 24 hours) at 08/18/13 1233 Last data filed at 08/18/13 1132  Gross per 24 hour  Intake   1360 ml  Output   1726 ml  Net   -366 ml   Exam: General: No acute respiratory distress Lungs: Clear to auscultation bilaterally without wheezes or crackles Cardiovascular: Regular rate and rhythm without murmur gallop or rub  normal S1 and S2 Abdomen: Nontender, nondistended, soft, bowel sounds positive, no rebound, no ascites, no appreciable mass Extremities: No significant cyanosis, clubbing, edema bilateral lower extremities  Data Reviewed: Basic Metabolic Panel:  Recent Labs Lab 08/16/13 1704 08/18/13 0425  NA 136 136  K 4.1 3.5  CL 103 103  CO2 18* 22  GLUCOSE 125* 130*  BUN 42* 30*  CREATININE 1.66* 1.28  CALCIUM  8.9 8.6   Liver Function Tests:  Recent Labs Lab 08/16/13 1704  AST 12  ALT 8  ALKPHOS 42  BILITOT 0.2*  PROT 6.6  ALBUMIN 3.4*   CBC:  Recent Labs Lab 08/16/13 1704 08/17/13 0620  WBC 10.5 8.6  HGB 4.1* 7.7*  HCT 13.7* 23.5*  MCV 79.2 78.3  PLT 185 141*   Cardiac Enzymes:  Recent Labs Lab 08/16/13 2305 08/17/13 0620 08/17/13 1055  TROPONINI <0.30 <0.30 <0.30   BNP (last 3 results)  Recent Labs  08/16/13 1704  PROBNP 1414.0*    Recent Results (from the past 240 hour(s))  MRSA PCR SCREENING     Status: None   Collection Time    08/16/13 11:36 PM      Result Value Range Status   MRSA by PCR NEGATIVE  NEGATIVE Final   Comment:            The GeneXpert MRSA Assay (FDA     approved for NASAL specimens     only), is one component of a     comprehensive MRSA colonization     surveillance program. It is not     intended to diagnose MRSA     infection nor to guide or     monitor treatment for     MRSA infections.     Studies:  Recent x-ray studies have been reviewed in detail by the Attending Physician  Scheduled Meds:  Scheduled Meds: . cyanocobalamin  1,000 mcg Intramuscular Daily  . fluticasone  1 puff Inhalation BID  . metoprolol succinate  12.5 mg Oral Daily  . simvastatin  20 mg Oral QHS    Time spent on care of this patient: 35 mins   [facility]  [facility] Office  [phone] Pager - Text Page per Loretha Stapler as per below:  On-Call/Text Page:      [email or web address]      password [email or web address]  If 7PM-7AM, please contact night-coverage [email or web address] Password [email or web address] 08/18/2013, 12:33 PM   LOS: 2 days

## 2013-08-18 NOTE — Progress Notes (Signed)
Pt completed Parenteral iron infusion.  Hgb is 7.7.  Dr Claris Che staff will be arranging follow up lab work and office visit. Pt has GI ROV wih Dr Leone Payor for mid November 2014. At that time decision re pursuing repeat Colonoscopy or capsule endoscopy can be revisited.   Awaiting SB biopsies, rule out celiac disease.   Since he had no mucosal indication for PPI, has no reflux sxs, and he had self discontinued Protonix (recently started empirically by PMD) due to s/e of body aches:  I am discontinuing Protonix.   Jennye Moccasin PA-C [phone] Attending MD note:   I have reviewed the above note,  and agree with plan of treatment.  [facility] Pager # [phone]

## 2013-08-18 NOTE — Telephone Encounter (Signed)
I put in the future order

## 2013-08-18 NOTE — Consult Note (Signed)
[facility] Consult: 8:31 AM 08/18/2013   Referring Provider:  Vassie Loll    Primary Care Physician:  Nelwyn Salisbury, MD Primary Gastroenterologist:  Dr. Stan Head Cards:  Alanda Amass, pt will be establishing with Dr Allyson Sabal  Reason for Consultation:  Significant anemia, Hgb 4.1  HPI: Earl Castillo is a 72 y.o. male.  PMH of CAD s/p CABG, PVD s/p femoral artery BPG, GERD, HTN and HLD.  Adenomatous and hyperplastic colon polyps on 08/2012 colonoscopy.  Unremarkable Echo on 08/02/13 Presented to Dr Clent Ridges 08/2012 with >/= to 2 weeks of progressive weakness and fatigue.  Hgb clocked in at 4.1 which is down from 7.5 about 3.5 weeks ago and down about 9.5 grams in past 13 months.  Iron, ferritin, B12 levels were low on 07/27/13.  3 stool cards were FOB + on 08/02/13.  These labs of late September were not addressed by ordering MD.  Pt went to see Dr Clent Ridges 10/13 c/o of the weakness.  Repeat labs showed further Hgb decline to 4.1 and pt sent to ED for admission/transfusion.  Stool is FOB + but pt has not had digestive troubles, nor melena or rectal bleeding.    Outpt med list includes BID Protonix and daily ASA 81 mg. However the PPI was started about 6 weeks ago and caused generalized bodyaches so pt stopped this.   Was not having any GI sxs:  No n/v, no abd pain.  No alterations in chronic pattern of loose stools for 1 to 2 days followed by no stools for a few days.  No dysphagia.  No BPR, no melena.  No NSAIDs.  No ETOH.  He endorses 10 days or so of anorexia as weakness progressed.  Wt can fluctuate by 5 # or so.  Weight has gone from 154 # to 147# in last few weeks.  No swelling, no angina.  + LE claudication:  Due to follow up with Dr Durwin Nora re adressing LE PVD.  Never had an EGD   Pt feeling much better after 3 units RBCs, weakness has resolved.      Past Medical History  Diagnosis Date  . Hyperlipidemia   . Hypertension   . Chicken pox   .  Blood transfusion abn reaction or complication, no procedure mishap   . Coronary artery disease     sees Dr. Alanda Amass  . Osteoarthritis   . Nephrolithiasis   . Skin exam, screening for cancer     sees Dr. Girard Cooter   . Hemorrhoid   . GERD (gastroesophageal reflux disease)   . Colon polyps 08/2012    Hyperplastic and adenomatous sessile polyps    Past Surgical History  Procedure Laterality Date  . Neck surgery  1978  . Bypass rt leg  2000  . Coronary artery bypass graft  2003  . Right carotid endarterectomy    . Basket extraction of ureteral stone  1970's  . Basal cell carcinoma excision  May 2009    per Dr. Ellen Henri , left nose   . Colonoscopy  2005, 08/2012  . Cataract extraction  2014    Bil.  per Dr Nile Riggs  . Esophagogastroduodenoscopy N/A 08/17/2013    Procedure: ESOPHAGOGASTRODUODENOSCOPY (EGD);  Surgeon: Hart Carwin, MD;  Location: [facility] ENDOSCOPY;  Service: Endoscopy;  Laterality: N/A;    Prior to Admission medications   Medication Sig Start Date End Date Taking? Authorizing Provider  albuterol (PROVENTIL HFA;VENTOLIN HFA) 108 (90 BASE) MCG/ACT inhaler Inhale 2 puffs into the lungs  every 6 (six) hours as needed for wheezing. 11/05/12  Yes Terressa Koyanagi, DO  aspirin 81 MG tablet Take 81 mg by mouth daily.   Yes Historical Provider, MD  beclomethasone (QVAR) 40 MCG/ACT inhaler Inhale 2 puffs into the lungs 2 (two) times daily as needed (shortness of breath).  11/05/12  Yes Terressa Koyanagi, DO  benazepril (LOTENSIN) 40 MG tablet Take 1 tablet (40 mg total) by mouth daily. 06/10/13  Yes Governor Rooks, MD  hydrochlorothiazide (HYDRODIURIL) 50 MG tablet Take 1 tablet (50 mg total) by mouth daily. 06/14/13  Yes Governor Rooks, MD  metoprolol (LOPRESSOR) 50 MG tablet Take 0.5 tablets (25 mg total) by mouth daily. 05/05/13  Yes Governor Rooks, MD  neomycin-colistin-hydrocortisone-thonzonium (CORTISPORIN TC) 3.01-04-09-0.5 MG/ML otic suspension Place 4 drops into the left  ear 4 (four) times daily as needed (infection).   Yes Historical Provider, MD  NIFEdipine (PROCARDIA XL/ADALAT-CC) 90 MG 24 hr tablet Take 1 tablet (90 mg total) by mouth daily. 06/10/13  Yes Governor Rooks, MD  simvastatin (ZOCOR) 20 MG tablet Take 1 tablet (20 mg total) by mouth at bedtime. 06/10/13  Yes Governor Rooks, MD    Scheduled Meds: . cyanocobalamin  1,000 mcg Intramuscular Daily  . fluticasone  1 puff Inhalation BID  . influenza vac split quadrivalent PF  0.5 mL Intramuscular Tomorrow-1000  . metoprolol succinate  12.5 mg Oral Daily  . pantoprazole  40 mg Oral Daily  . pneumococcal 23 valent vaccine  0.5 mL Intramuscular Tomorrow-1000  . simvastatin  20 mg Oral QHS   Infusions: . sodium chloride 10 mL (08/16/13 2229)   PRN Meds: acetaminophen, acetaminophen, albuterol, ondansetron (ZOFRAN) IV, ondansetron   Allergies as of 08/16/2013  . (No Known Allergies)    Family History  Problem Relation Age of Onset  . Colon cancer Neg Hx   . Heart disease Father        Peptic ulcers and associated GI bleeding requring surgery in his Mom.   History   Social History  . Marital Status: Married    Spouse Name: N/A    Number of Children: N/A  . Years of Education: N/A   Occupational History  . Retired from working in a Designer, industrial/product for 30 years.  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 Smoking status: Says he quit early 08/2013. 1/2 pack/day for 57 years    Types: Cigarettes  . Smokeless tobacco: Never Used     Comment: smokes 3 a day or less  . Alcohol Use: No  . Drug Use: No  . Sexual Activity: Not on file    Social History Narrative   Daily caffeine     REVIEW OF SYSTEMS: Constitutional:  Per HPI ENT:  No nose bleeds, no congestion Pulm:  No cough, no dyspnea CV:  No angina.  + claudication GU:  No nocturia except last PM after doses of IV Lasix.  No blood in urine.  No scrotal  edema GI:  Per HPI Heme:  Never had anemia before.    Transfusions:  None before 10/13 Neuro:  No headaches.  Eye sight great: had bil cataract surgery early summer 2014 over 2 week time period.  Derm:  No sores or rash or itching Endocrine:  No excessive thirst, no sweats Immunization:  Flu and pneumovax shots scheduled for today.  Travel:  None beyond state of [place]  PHYSICAL EXAM: Vital signs in last 24 hours: Temp:  [97.9 F (36.6 C)-99.3 F (37.4 C)] 99.3 F (37.4 C) (10/15 0338) Pulse Rate:  [49-68] 60 (10/15 0338) Resp:  [12-26] 18 (10/15 0338) BP: (119-173)/(39-78) 135/43 mmHg (10/15 0338) SpO2:  [92 %-99 %] 97 % (10/15 0338) Weight:  [67.2 kg (148 lb 2.4 oz)] 67.2 kg (148 lb 2.4 oz) (10/15 0500)  General: very pleasant Head:  No swelling or asymmetry  Eyes:  No icterus or pallor Ears:  Slight HOH  Nose:  No discharge Mouth:  Clear and moist oral MM Neck:  No JVD, no mass Lungs:  Clear bil.  No cough.  No dyspnea Heart: RRR.  No MRG Abdomen:  Soft, ND, NT.  No mass or HSM.   Rectal: not repeated.   Musc/Skeltl: no joint swelling or contracture Extremities:  No CCE Heme:  Some small  Purpura on forearms  Neurologic:  No tremor, oriented x 3.  No deficits Skin:  No rash or sores Tattoos:  none Nodes:  No cervical adenopathy   Psych:  Pleasant, in good spirits.    Intake/Output from previous day: 10/14 0701 - 10/15 0700 In: 1000 [I.V.:500; IV Piggyback:500] Out: 1426 [Urine:1425; Stool:1] Intake/Output this shift:    LAB RESULTS:  Recent Labs  08/16/13 1704 08/17/13 0620  WBC 10.5 8.6  HGB 4.1* 7.7*  HCT 13.7* 23.5*  PLT 185 141*   BMET Lab Results  Component Value Date   NA 136 08/18/2013   NA 136 08/16/2013   NA 141 07/21/2013   K 3.5 08/18/2013   K 4.1 08/16/2013   K 4.7 07/21/2013   CL 103 08/18/2013   CL 103 08/16/2013   CL 107 07/21/2013   CO2 22 08/18/2013   CO2 18* 08/16/2013   CO2 26 07/21/2013   GLUCOSE 130* 08/18/2013   GLUCOSE  125* 08/16/2013   GLUCOSE 98 07/21/2013   BUN 30* 08/18/2013   BUN 42* 08/16/2013   BUN 24* 07/21/2013   CREATININE 1.28 08/18/2013   CREATININE 1.66* 08/16/2013   CREATININE 1.38* 07/21/2013   CALCIUM 8.6 08/18/2013   CALCIUM 8.9 08/16/2013   CALCIUM 9.0 07/21/2013   LFT  Recent Labs  08/16/13 1704  PROT 6.6  ALBUMIN 3.4*  AST 12  ALT 8  ALKPHOS 42  BILITOT 0.2*   PT/INR No results found for this basename: INR,  PROTIME   RADIOLOGY STUDIES: Dg Chest 2 View 08/16/2013     IMPRESSION: Small right pleural effusion.   Electronically Signed   By: Charline Bills M.D.   On: 08/16/2013 18:22    ENDOSCOPIC STUDIES: 08/2012   Colonoscopy per dr Leone Payor For change in bowel habits 4 diminutive sesile polyps removed in descending and sigmoid colon. Small int "rrhoids.  Otherwise normal mucosa Pathology:  Fragment of Tubular Adenoma and Hyperplastic polyps.  Plan repeat colonoscopy in 08/2017.  02/2004  Colonoscopy Hemorrhoids    IMPRESSION: *  Acute on chronic normocytic anemia.  Iron and B12 deficiency by 07/27/13 studies. Hgb down 3.4 grams in just under one month, previously normal 13 months ago.  Hgb and pt's clinical complaints improved following PRBCs x 3 units overnight.  Stool is FOB + but no overt melena or BPR.  Rule out PUD, rule out AVMs.  No sxs to suggest neoplasia.    *  Chronic kidney disease?  BUN/creatinine do not seem low enough to be causing renal induced anemia/epo deficiency  *  Negative cardiac enzymes x 4.    *  ASPVD of LE   PLAN: *  egd today at 1 PM. *  CBC and TSH in AM (last TSH was normal 05/2012) *  Stop PPI if possible, depending on EGD, as it caused body aches in recent past. I dropped it to once from twice daily in the meantime *  b 12 shot series:  Once daily x 7 days, once weekly x 4 weeks, then once monthly thereafter per up to date. May stop therapy if cause for deficiency is eradicated. I placed the initial order.  *  Start po iron  after endoscopic workup completed.    LOS: 2 days   Jennye Moccasin  08/18/2013, 8:31 AM Pager: [phone] Attending MD note:   I have reviewed the above note, examined the patient  , see EGD note- no evidence of bleeding lesion. Will need Capsule endoscopy, but this will be done as an outpatient when he follows up with Dr Leone Payor, who may also consider repeating his colonoscopy. May discharge on oral Iron, B12 injections at Dr fry's office.  [facility] Pager # [phone]

## 2013-08-19 DIAGNOSIS — I2510 Atherosclerotic heart disease of native coronary artery without angina pectoris: Secondary | ICD-10-CM | POA: Diagnosis not present

## 2013-08-19 DIAGNOSIS — K922 Gastrointestinal hemorrhage, unspecified: Secondary | ICD-10-CM | POA: Diagnosis not present

## 2013-08-19 DIAGNOSIS — D62 Acute posthemorrhagic anemia: Secondary | ICD-10-CM | POA: Diagnosis not present

## 2013-08-19 DIAGNOSIS — D509 Iron deficiency anemia, unspecified: Secondary | ICD-10-CM | POA: Diagnosis not present

## 2013-08-19 LAB — TYPE AND SCREEN
Antibody Screen: NEGATIVE
Unit division: 0
Unit division: 0
Unit division: 0

## 2013-08-19 LAB — CBC
MCH: 26.5 pg (ref 26.0–34.0)
MCHC: 33.1 g/dL (ref 30.0–36.0)
MCV: 79.9 fL (ref 78.0–100.0)
Platelets: 135 10*3/uL — ABNORMAL LOW (ref 150–400)
RBC: 3.44 MIL/uL — ABNORMAL LOW (ref 4.22–5.81)
RDW: 15.4 % (ref 11.5–15.5)

## 2013-08-19 MED ORDER — CYANOCOBALAMIN 1000 MCG/ML IJ SOLN: 1000.0000 ug | Freq: Every day | INTRAMUSCULAR | Status: DC

## 2013-08-19 MED ORDER — DOCUSATE SODIUM 100 MG PO CAPS: 100.0000 mg | ORAL_CAPSULE | Freq: Two times a day (BID) | ORAL | Status: DC

## 2013-08-19 MED ORDER — FERROUS GLUCONATE 324 (38 FE) MG PO TABS: 324.0000 mg | ORAL_TABLET | Freq: Every day | ORAL | Status: DC

## 2013-08-19 NOTE — Telephone Encounter (Signed)
Ok to schedule; pt is still admitted in the hospital.  Thanks.

## 2013-08-19 NOTE — Telephone Encounter (Signed)
Left message on home and mobile

## 2013-08-19 NOTE — Evaluation (Signed)
Physical Therapy Evaluation Patient Details Name: Earl Castillo MRN: [id number] DOB: Dec 22, 1940 Today's Date: 08/19/2013 Time: 7829-5621 PT Time Calculation (min): 14 min  PT Assessment / Plan / Recommendation History of Present Illness   y.o. male with pmh significant for CAD s/p CABG; GERD, HTN and HLD; came to ED secondary to increased weakness and fatigue. Patient reported symptoms had been present for 2 weeks and worsening. Patient denied CP, SOB, abd pain, nausea, vomiting, hematuria, hematochezia or hematemesis. Patient had been followed for the preceding 4 weeks with his PCP and Cardiologist and had 3/3 FOBT positive and a drop in Hgb from 10.2 to 7.5.  Hgb was noted to be 4.1 at time of admission.  3 units of PRBC's were ordered by ED physician and GI was consulted.  Clinical Impression  Pt ready to d/c home however pt with LOB x 2 during ambulation.  Educated pt and pt's wife on using [facility] initially when d/c home due to right hip pain (pt says from arthritis).  Also educated on frequent walks at least 4 x/day short distance to improve overall endurance and slowly build up distance.  Pt and pt's agreed and have SPC at home.     PT Assessment  Patent does not need any further PT services    Follow Up Recommendations  No PT follow up;Supervision - Intermittent    Equipment Recommendations  None recommended by PT    Precautions / Restrictions Precautions Precautions: Fall   Pertinent Vitals/Pain C/o right hip pain due to arthritis per pt but does not rate      Mobility  Bed Mobility Bed Mobility: Supine to Sit Supine to Sit: 6: Modified independent (Device/Increase time) Transfers Transfers: Sit to Stand;Stand to Sit Sit to Stand: 6: Modified independent (Device/Increase time) Stand to Sit: 6: Modified independent (Device/Increase time) Ambulation/Gait Ambulation/Gait Assistance: 4: Min guard;5: Supervision Ambulation Distance (Feet): 200 Feet Assistive device:  None Ambulation/Gait Assistance Details: Minguard for safety due LOB x 2 during ambulation.  Pt reports right hip pain during ambulation.  Highly reommend to ambulate with SPC initially when d/c home and to have wife present during ambulation initially until pt improve endurance. Gait Pattern: Step-through pattern Stairs: No    Exercises     PT Diagnosis:    PT Problem List:   PT Treatment Interventions:       PT Goals(Current goals can be found in the care plan section)    Visit Information  Last PT Received On: 08/19/13 Assistance Needed: +1 History of Present Illness:  y.o. male with pmh significant for CAD s/p CABG; GERD, HTN and HLD; came to ED secondary to increased weakness and fatigue. Patient reported symptoms had been present for 2 weeks and worsening. Patient denied CP, SOB, abd pain, nausea, vomiting, hematuria, hematochezia or hematemesis. Patient had been followed for the preceding 4 weeks with his PCP and Cardiologist and had 3/3 FOBT positive and a drop in Hgb from 10.2 to 7.5.  Hgb was noted to be 4.1 at time of admission.  3 units of PRBC's were ordered by ED physician and GI was consulted.       Prior Functioning  Home Living Family/patient expects to be discharged to:: Private residence Living Arrangements: Spouse/significant other Available Help at Discharge: Family Type of Home: House Home Access: Level entry Home Layout: One level Home Equipment: Cane - single point Prior Function Level of Independence: Independent Communication Communication: No difficulties Dominant Hand: Right    Cognition  Cognition Arousal/Alertness:  Awake/alert Behavior During Therapy: Impulsive (ready to go home) Overall Cognitive Status: Within Functional Limits for tasks assessed    Extremity/Trunk Assessment Lower Extremity Assessment Lower Extremity Assessment: Overall WFL for tasks assessed   Balance    End of Session PT - End of Session Equipment Utilized During  Treatment: Gait belt Activity Tolerance: Patient tolerated treatment well Patient left: in bed;with call bell/phone within reach (sitting EOB ready to go home) Nurse Communication: Mobility status  GP     Earl Castillo 08/19/2013, 5:46 PM   Jake Shark, PT DPT [phone]

## 2013-08-19 NOTE — Discharge Summary (Signed)
Physician Discharge Summary  Earl Castillo ZOX:[id number] DOB: May 12, 1941 DOA: 08/16/2013  PCP: Earl Salisbury, MD  Admit date: 08/16/2013 Discharge date: 08/19/2013  Time spent: >45 minutes  Recommendations for Outpatient Follow-up:  1. Check Bp and resume diuretics as needed 2. Check Hgb in 2-4 wks 3. Ensure he has completed 3 more days of B 12 s/c injections and f/u B 12 levels 4.  resume ASA as outpt  Discharge Diagnoses:  Principal Problem:   GI bleed Active Problems:   HYPERLIPIDEMIA   HYPERTENSION   CORONARY ARTERY DISEASE   Fatigue   Acute blood loss anemia   Iron deficiency anemia, unspecified   Discharge Condition: stable  Diet recommendation: heart healthy  Filed Weights   08/16/13 1641 08/18/13 0500 08/19/13 0412  Weight: 66.821 kg (147 lb 5 oz) 67.2 kg (148 lb 2.4 oz) 63.5 kg (139 lb 15.9 oz)    History of present illness:  72 y.o. male with pmh significant for CAD s/p CABG; GERD, HTN and HLD; came to ED secondary to increased weakness and fatigue. Patient reported symptoms had been present for 2 weeks and worsening. Patient denied CP, SOB, abd pain, nausea, vomiting, hematuria, hematochezia or hematemesis. Patient had been followed for the preceding 4 weeks with his PCP and Cardiologist and had 3/3 FOBT positive and a drop in Hgb from 10.2 to 7.5. Hgb was noted to be 4.1 at time of admission. 3 units of PRBC's were ordered by ED physician and GI was consulted.   Hospital Course:  GI bleed  FOB + but pt has not had melena or rectal bleeding - drop in Hgb from 10.7 to 4.1 in 4 weeks - EGD unrevealing as to the source of bleeding  - plan per GI as to f/u with Earl Castillo on 11/18 - Earl Castillo to follow closely with pt in the interim  - d/c ASA for now  Acute blood loss anemia - severe  Status post 3 units packed red blood cells thus far - given history of coronary artery disease our target hemoglobin should be 8.0 or greater - - hemoglobin has improved as  expected with transfusions- 4 U of PRBC Hgb from 4.1 to 9/1  Iron deficiency - received Iron dextran x 1- 500 ml - will receive a prescription for Iron tabs - advised to take Colace with them  Severe B12 deficiency  B12 was noted to be <200 - pt is being loaded with SQ B12 95 days of s/c)  - recheck will need to be accomplished in the oupt setting   Adenomatous and hyperplastic colon polyps on 08/2012 colonoscopy  Coronary artery disease status post CABG  Asymptomatic at present - as noted above hemoglobin goal needs to be 8.0 or greater   Chronic renal failure Stage 2 with mild acute renal injury  Renal function has returned to the patient's baseline - acute renal injury likely simply prerenal exacerbation due to severe anemia   Diastolic congestive heart failure - grade 1  Well compensated at the present time   Hypertension  Well-controlled at this time  -will cont B Blocker and Nifedipine on d/c - will need to add back diuretics at later date- asked to monitor BP at home and resume them if systolic > 130  Hyperlipidemia  Continue home medical therapy   Reported history of asthma  Well compensated at this time   PVD s/p femoral artery BPG      Procedures: EGD 10/14-irregular Z line status post  biopsies  status post the biopsies from the descending duodenum to rule out  villous atrophy nothing to account for GI blood loss    Consultations:  GI  Subjective: d/c plan discussed w/ pt and wife and Earl Castillo. Pt ambulated with PT without any trouble  Discharge Exam: Filed Vitals:   08/19/13 1142  BP: 139/42  Pulse:   Temp: 98.7 F (37.1 C)  Resp:     General: elderly man laying in bed- AAO x 3, no distress Cardiovascular: RRR, no murmurs Respiratory: CTA b/l   Discharge Instructions  Discharge Orders   Future Appointments Provider Department Dept Phone   09/21/2013 9:45 AM Earl Boop, MD [facility] [phone]    09/22/2013 11:00 AM Earl Hint, MD [facility] [phone]   Future Orders Complete By Expires   Diet - low sodium heart healthy  As directed    Discharge instructions  As directed    Comments:     You may resume the Nifedipine tomorrow.   Increase activity slowly  As directed        Medication List    STOP taking these medications       aspirin 81 MG tablet     benazepril 40 MG tablet  Commonly known as:  LOTENSIN     hydrochlorothiazide 50 MG tablet  Commonly known as:  HYDRODIURIL        pantoprazole 40 MG tablet  Commonly known as:  PROTONIX      TAKE these medications       albuterol 108 (90 BASE) MCG/ACT inhaler  Commonly known as:  PROVENTIL HFA;VENTOLIN HFA  Inhale 2 puffs into the lungs every 6 (six) hours as needed for wheezing.     beclomethasone 40 MCG/ACT inhaler  Commonly known as:  QVAR  Inhale 2 puffs into the lungs 2 (two) times daily as needed (shortness of breath).     cyanocobalamin 1000 MCG/ML injection  Commonly known as:  (VITAMIN B-12)  Inject 1 mL (1,000 mcg total) into the muscle daily.     docusate sodium 100 MG capsule  Commonly known as:  COLACE  Take 1 capsule (100 mg total) by mouth 2 (two) times daily.     ferrous gluconate 324 MG tablet  Commonly known as:  FERGON  Take 1 tablet (324 mg total) by mouth daily with breakfast.     metoprolol 50 MG tablet  Commonly known as:  LOPRESSOR  Take 0.5 tablets (25 mg total) by mouth daily.     neomycin-colistin-hydrocortisone-thonzonium 3.01-04-09-0.5 MG/ML otic suspension  Commonly known as:  CORTISPORIN TC  Place 4 drops into the left ear 4 (four) times daily as needed (infection).     simvastatin 20 MG tablet  Commonly known as:  ZOCOR  Take 1 tablet (20 mg total) by mouth at bedtime.    Nifedipine 90 mg daily      No Known Allergies Follow-up Information   Follow up with Earl Head, MD On 09/21/2013. (9:45 AM.  GI follow up)     Specialty:  Gastroenterology   Contact information:   [address] [place] [address] [phone]        The results of significant diagnostics from this hospitalization (including imaging, microbiology, ancillary and laboratory) are listed below for reference.    Significant Diagnostic Studies: Dg Chest 2 View  08/16/2013   CLINICAL DATA:  Weakness  EXAM: CHEST  2 VIEW  COMPARISON:  03/24/2012  FINDINGS:  Chronic interstitial markings/ emphysematous changes. Small right pleural effusion, new. No pneumothorax.  The heart is top-normal in size. Postsurgical changes related to prior CABG.  Degenerative changes of the visualized thoracolumbar spine.  IMPRESSION: Small right pleural effusion.   Electronically Signed   By: Charline Bills M.D.   On: 08/16/2013 18:22    Microbiology: Recent Results (from the past 240 hour(s))  MRSA PCR SCREENING     Status: None   Collection Time    08/16/13 11:36 PM      Result Value Range Status   MRSA by PCR NEGATIVE  NEGATIVE Final   Comment:            The GeneXpert MRSA Assay (FDA     approved for NASAL specimens     only), is one component of a     comprehensive MRSA colonization     surveillance program. It is not     intended to diagnose MRSA     infection nor to guide or     monitor treatment for     MRSA infections.     Labs: Basic Metabolic Panel:  Recent Labs Lab 08/16/13 1704 08/18/13 0425  NA 136 136  K 4.1 3.5  CL 103 103  CO2 18* 22  GLUCOSE 125* 130*  BUN 42* 30*  CREATININE 1.66* 1.28  CALCIUM 8.9 8.6   Liver Function Tests:  Recent Labs Lab 08/16/13 1704  AST 12  ALT 8  ALKPHOS 42  BILITOT 0.2*  PROT 6.6  ALBUMIN 3.4*   No results found for this basename: LIPASE, AMYLASE,  in the last 168 hours No results found for this basename: AMMONIA,  in the last 168 hours CBC:  Recent Labs Lab 08/16/13 1704 08/17/13 0620 08/19/13 0505  WBC 10.5 8.6 10.4  HGB 4.1* 7.7* 9.1*  HCT 13.7* 23.5* 27.5*   MCV 79.2 78.3 79.9  PLT 185 141* 135*   Cardiac Enzymes:  Recent Labs Lab 08/16/13 2305 08/17/13 0620 08/17/13 1055  TROPONINI <0.30 <0.30 <0.30   BNP: BNP (last 3 results)  Recent Labs  08/16/13 1704  PROBNP 1414.0*   CBG: No results found for this basename: GLUCAP,  in the last 168 hours     Signed:  Mohan Erven  [facility] 08/19/2013, 3:17 PM

## 2013-08-19 NOTE — Progress Notes (Signed)
Discussed discharge instructions and medications with patient and wife. Both verbalized understanding with all questions answered. VSS. Pt discharged home with wife  Georgine Wiltse,RN  

## 2013-08-19 NOTE — Progress Notes (Signed)
OT Cancellation Note  Patient Details Name: Earl Castillo MRN: [id number] DOB: 29-Aug-1941   Cancelled Treatment:    Reason Eval/Treat Not Completed: OT screened, no needs identified, will sign off  [facility], OTR/L  [phone] 08/19/2013 08/19/2013, 3:21 PM

## 2013-08-20 LAB — RETICULIN ANTIBODIES, IGA W TITER: Reticulin Ab, IgA: NEGATIVE

## 2013-08-20 NOTE — Telephone Encounter (Signed)
Pt has been sch for blood work also post hos fup

## 2013-08-23 ENCOUNTER — Other Ambulatory Visit (INDEPENDENT_AMBULATORY_CARE_PROVIDER_SITE_OTHER): Payer: Medicare Other

## 2013-08-23 ENCOUNTER — Telehealth: Payer: Self-pay | Admitting: Family Medicine

## 2013-08-23 DIAGNOSIS — D62 Acute posthemorrhagic anemia: Secondary | ICD-10-CM

## 2013-08-23 LAB — CBC WITH DIFFERENTIAL/PLATELET
Basophils Absolute: 0 10*3/uL (ref 0.0–0.1)
Basophils Relative: 0.4 % (ref 0.0–3.0)
HCT: 26.9 % — ABNORMAL LOW (ref 39.0–52.0)
Lymphs Abs: 1.3 10*3/uL (ref 0.7–4.0)
MCV: 84.9 fl (ref 78.0–100.0)
Monocytes Absolute: 1 10*3/uL (ref 0.1–1.0)
Monocytes Relative: 10.5 % (ref 3.0–12.0)
Neutrophils Relative %: 74 % (ref 43.0–77.0)
Platelets: 150 10*3/uL (ref 150.0–400.0)
RBC: 3.17 Mil/uL — ABNORMAL LOW (ref 4.22–5.81)
RDW: 18.2 % — ABNORMAL HIGH (ref 11.5–14.6)

## 2013-08-23 NOTE — Telephone Encounter (Signed)
Pt states a rx was called in for B12.  However, he did not have any syringes called in. Pt asking for a call back from Dr. Claris Che nurse with the appropriate size syringe he needs and also a rx sent to [facility].

## 2013-08-24 MED ORDER — NEEDLES & SYRINGES MISC: Status: DC

## 2013-08-24 NOTE — Progress Notes (Signed)
Quick Note:  I spoke with pt ______ 

## 2013-08-24 NOTE — Telephone Encounter (Signed)
I spoke with and he only needs enough syringes & needles for 3 injections. I sent script to pharmacy e-scribe.

## 2013-08-25 ENCOUNTER — Ambulatory Visit (INDEPENDENT_AMBULATORY_CARE_PROVIDER_SITE_OTHER): Payer: Medicare Other | Admitting: Family Medicine

## 2013-08-25 ENCOUNTER — Telehealth: Payer: Self-pay

## 2013-08-25 ENCOUNTER — Encounter: Payer: Self-pay | Admitting: Family Medicine

## 2013-08-25 VITALS — BP 134/60 | HR 70 | Temp 98.1°F | Wt 149.0 lb

## 2013-08-25 DIAGNOSIS — D649 Anemia, unspecified: Secondary | ICD-10-CM

## 2013-08-25 DIAGNOSIS — I10 Essential (primary) hypertension: Secondary | ICD-10-CM

## 2013-08-25 DIAGNOSIS — I2510 Atherosclerotic heart disease of native coronary artery without angina pectoris: Secondary | ICD-10-CM | POA: Diagnosis not present

## 2013-08-25 DIAGNOSIS — K922 Gastrointestinal hemorrhage, unspecified: Secondary | ICD-10-CM | POA: Diagnosis not present

## 2013-08-25 DIAGNOSIS — D62 Acute posthemorrhagic anemia: Secondary | ICD-10-CM | POA: Diagnosis not present

## 2013-08-25 NOTE — Progress Notes (Signed)
  Subjective:    Patient ID: Earl Castillo, male    DOB: November 06, 1940, 72 y.o.   MRN: [id number]  HPI Here to follow up on a hospital stay from 08-16-13 to 08-19-13 for severe anemia, resulting from both chronic low B12 and low iron as well as probable acute GI losses. His admission Hgb was 4.1 and by the time he received 4 units of PRBC this was up to 9.1 at the time of admission. His ferritin was 6 and his B12 was 182. He also received some iron transfusions during this stay. He has B12 at home but has not given himself any of this. Several of his BP meds were held on admission and he remains off these. He feels much better now with much more energy. He is still not quite up to normal energy levels however. He remains off work. He had another CBC here on 08-23-13 and the Hgb has settled slightly to 8.8. He had an unremarkable colonoscopy a year ago and he had an EGD during this hospitalization which revealed no source of bleeding. Ultimately it was presumed that his blood loss may be from the small bowel.    Review of Systems  Constitutional: Positive for fatigue.  Respiratory: Negative.   Cardiovascular: Negative.   Gastrointestinal: Negative.   Neurological: Negative.        Objective:   Physical Exam  Constitutional: He is oriented to person, place, and time. He appears well-developed and well-nourished.  Cardiovascular: Normal rate, regular rhythm, normal heart sounds and intact distal pulses.   Pulmonary/Chest: Effort normal and breath sounds normal.  Neurological: He is alert and oriented to person, place, and time.          Assessment & Plan:  He seems to be stable for now with severe anemia. He will give himself 3 consequetive days of B12 shots and next week we will start weekly injections at the office. He is scheduled to see Dr. Leone Payor on 09-21-13. We will check another CBC in 2 weeks to ensure stability of the HGb. We will stay off Benazepril and HCTZ for now.

## 2013-08-25 NOTE — Telephone Encounter (Signed)
Message copied by Annett Fabian on Wed Aug 25, 2013 10:58 AM ------      Message from: Stan Head E      Created: Wed Aug 25, 2013  6:21 AM      Regarding: FW: follow up       CBC is stable            Have him get CBC in days before he sees me in Nov            Thanks      ----- Message -----         From: Dianah Field, PA-C         Sent: 08/17/2013   4:00 PM           To: Iva Boop, MD, Nelwyn Salisbury, MD, #      Subject: follow up                                                Hi Dr Clent Ridges      Mr Hickel had EGD today: unremarkable but biopsies pending to rule out celiac disease.  He got 3 units of PRBCs. He is to get iron infusion tonight.  Started on IM B12 replacement according to Up to Date recs (see my Epic progress note of 08/17/13)            He has follow up in mid November with Dr Leone Payor            Can you arrange follow up CBC for him?  Also will need to have follow up B 12 shot visits arranged.              ThanksJennye Moccasin PA-C      Pager [phone]      Cell [phone] .       ------

## 2013-08-25 NOTE — Telephone Encounter (Signed)
Patient advised REV scheduled for 09/21/13.  He is aware to come on 11/13 or 11/14 for labs

## 2013-09-03 ENCOUNTER — Ambulatory Visit (INDEPENDENT_AMBULATORY_CARE_PROVIDER_SITE_OTHER): Payer: Medicare Other | Admitting: Family Medicine

## 2013-09-03 DIAGNOSIS — E538 Deficiency of other specified B group vitamins: Secondary | ICD-10-CM

## 2013-09-03 MED ORDER — CYANOCOBALAMIN 1000 MCG/ML IJ SOLN
1000.0000 ug | Freq: Once | INTRAMUSCULAR | Status: AC
  Administered 2013-09-03: 1000 ug via INTRAMUSCULAR

## 2013-09-09 ENCOUNTER — Other Ambulatory Visit: Payer: Medicare Other

## 2013-09-10 ENCOUNTER — Ambulatory Visit (INDEPENDENT_AMBULATORY_CARE_PROVIDER_SITE_OTHER): Payer: Medicare Other | Admitting: Family Medicine

## 2013-09-10 ENCOUNTER — Other Ambulatory Visit (INDEPENDENT_AMBULATORY_CARE_PROVIDER_SITE_OTHER): Payer: Medicare Other

## 2013-09-10 DIAGNOSIS — I10 Essential (primary) hypertension: Secondary | ICD-10-CM

## 2013-09-10 DIAGNOSIS — E785 Hyperlipidemia, unspecified: Secondary | ICD-10-CM

## 2013-09-10 DIAGNOSIS — D649 Anemia, unspecified: Secondary | ICD-10-CM

## 2013-09-10 DIAGNOSIS — E538 Deficiency of other specified B group vitamins: Secondary | ICD-10-CM

## 2013-09-10 DIAGNOSIS — R5381 Other malaise: Secondary | ICD-10-CM

## 2013-09-10 LAB — CBC WITH DIFFERENTIAL/PLATELET
Eosinophils Absolute: 0.1 10*3/uL (ref 0.0–0.7)
Eosinophils Relative: 1.3 % (ref 0.0–5.0)
Hemoglobin: 9.3 g/dL — ABNORMAL LOW (ref 13.0–17.0)
MCV: 88.7 fl (ref 78.0–100.0)
Monocytes Relative: 5.8 % (ref 3.0–12.0)
Neutrophils Relative %: 75.6 % (ref 43.0–77.0)
Platelets: 199 10*3/uL (ref 150.0–400.0)
RDW: 24.4 % — ABNORMAL HIGH (ref 11.5–14.6)
WBC: 7.2 10*3/uL (ref 4.5–10.5)

## 2013-09-10 MED ORDER — CYANOCOBALAMIN 1000 MCG/ML IJ SOLN
1000.0000 ug | Freq: Once | INTRAMUSCULAR | Status: AC
  Administered 2013-09-10: 1000 ug via INTRAMUSCULAR

## 2013-09-13 ENCOUNTER — Encounter: Payer: Self-pay | Admitting: Cardiovascular Disease

## 2013-09-13 ENCOUNTER — Telehealth: Payer: Self-pay | Admitting: Family Medicine

## 2013-09-13 ENCOUNTER — Ambulatory Visit (INDEPENDENT_AMBULATORY_CARE_PROVIDER_SITE_OTHER): Payer: Medicare Other | Admitting: Cardiovascular Disease

## 2013-09-13 VITALS — BP 150/70 | HR 60 | Ht 66.5 in | Wt 146.9 lb

## 2013-09-13 DIAGNOSIS — I10 Essential (primary) hypertension: Secondary | ICD-10-CM

## 2013-09-13 DIAGNOSIS — I779 Disorder of arteries and arterioles, unspecified: Secondary | ICD-10-CM | POA: Diagnosis not present

## 2013-09-13 DIAGNOSIS — I739 Peripheral vascular disease, unspecified: Secondary | ICD-10-CM | POA: Diagnosis not present

## 2013-09-13 DIAGNOSIS — I2510 Atherosclerotic heart disease of native coronary artery without angina pectoris: Secondary | ICD-10-CM | POA: Diagnosis not present

## 2013-09-13 DIAGNOSIS — I6529 Occlusion and stenosis of unspecified carotid artery: Secondary | ICD-10-CM | POA: Diagnosis not present

## 2013-09-13 DIAGNOSIS — E785 Hyperlipidemia, unspecified: Secondary | ICD-10-CM

## 2013-09-13 NOTE — Assessment & Plan Note (Signed)
The patient has a known occluded right external iliac artery status post left to right fem-fem crossover grafting by Dr. Georganna Skeans May of 2000. We have been following his lower extremities but difficult to sound. He briefly complained of worsening claudication and Dr. Alanda Amass obtained arterial Doppler studies revealing ABIs in the mid 0.9 range bilaterally with a patent fem-fem crossover graft and high-grade disease in his left common and external iliac arteries. He was recently hospitalized with anemia of unknown etiology with a negative workup and was transfused 4 units. His claudication has markedly improved. We'll continue to follow his lower extremity arterial. Doppler studies. Should these progress he will need angiography and potential percutaneous intervention.

## 2013-09-13 NOTE — Progress Notes (Signed)
Quick Note:  I left voice message with results. ______ 

## 2013-09-13 NOTE — Telephone Encounter (Signed)
I spoke with pt and gave results. Pt wants to know when he can return to work and he will need a note to return.

## 2013-09-13 NOTE — Assessment & Plan Note (Signed)
Status post right carotid endarterectomy performed by Dr. Marcy Panning in 2006. He following respects ultrasound the last one of which was a year ago. We will recheck a carotid Doppler study. He does have loud carotid bruits.

## 2013-09-13 NOTE — Progress Notes (Signed)
09/13/2013 Finley Chevez Hammitt   [birth year]  [id number]  Primary Physician Nelwyn Salisbury, MD Primary Cardiologist: Runell Gess MD Roseanne Reno   HPI:  Mr. Selner is a 72 year old mildly overweight married Caucasian male father of 2, grandfather of one grandchild, formally a patient of Dr. Susa Griffins. I am assuming his care. He has a long history of ischemic heart disease status post coronary artery bypass grafting in 2004 by Dr. Cornelius Moras with a LIMA to his LAD, RIMA to the circumflex marginal and vein graft to the distal right coronary artery. He did have a Myoview stress test performed 08/23/12 which was low risk and nonischemic.he has hypertension and hyperlipidemia. He also has diffuse vascular disease status post right carotid endarterectomy performed by Dr. Foster Simpson in 2006 which we have been following by duplex ultrasound since. He's had a left to right fem-fem crossover graft by Dr. Liliane Bade May of 2000 which we also put on but ultrasound. He was recently admitted with worsening claudication and was found to have profound anemia. GI workup was negative. He was transfused 4 units of packed red blood cells with improvement in his symptoms. Recent lower extremity arterial Doppler studies to show progression of disease in his left common and external iliac artery.   Current Outpatient Prescriptions  Medication Sig Dispense Refill  . albuterol (PROVENTIL HFA;VENTOLIN HFA) 108 (90 BASE) MCG/ACT inhaler Inhale 2 puffs into the lungs every 6 (six) hours as needed for wheezing.  1 Inhaler  0  . beclomethasone (QVAR) 40 MCG/ACT inhaler Inhale 2 puffs into the lungs 2 (two) times daily as needed (shortness of breath).       . cyanocobalamin (,VITAMIN B-12,) 1000 MCG/ML injection Inject 1,000 mcg into the muscle once a week.      . docusate sodium (COLACE) 100 MG capsule Take 100 mg by mouth daily.      . ferrous gluconate (FERGON) 324 MG tablet Take 1 tablet (324 mg total) by  mouth daily with breakfast.  30 tablet  3  . metoprolol (LOPRESSOR) 50 MG tablet Take 0.5 tablets (25 mg total) by mouth daily.  15 tablet  8  . Needles & Syringes MISC Dispense syringes & needles for intramuscular injection, pt really only needs enough for 3 injections for Vitamin B12.  3 each  0  . neomycin-colistin-hydrocortisone-thonzonium (CORTISPORIN TC) 3.01-04-09-0.5 MG/ML otic suspension Place 4 drops into the left ear 4 (four) times daily as needed (infection).      Marland Kitchen NIFEdipine (PROCARDIA XL/ADALAT-CC) 90 MG 24 hr tablet       . simvastatin (ZOCOR) 20 MG tablet Take 1 tablet (20 mg total) by mouth at bedtime.  90 tablet  2   No current facility-administered medications for this visit.    No Known Allergies  History   Social History  . Marital Status: Married    Spouse Name: N/A    Number of Children: N/A  . Years of Education: N/A   Occupational History  . Not on file.   Social History Main Topics  . Smoking status: Current Every Day Smoker -- 0.25 packs/day for 57 years    Types: Cigarettes  . Smokeless tobacco: Never Used     Comment: smokes 3 a day or less  . Alcohol Use: No  . Drug Use: No  . Sexual Activity: Not on file   Other Topics Concern  . Not on file   Social History Narrative   Daily caffeine  Review of Systems: General: negative for chills, fever, night sweats or weight changes.  Cardiovascular: negative for chest pain, dyspnea on exertion, edema, orthopnea, palpitations, paroxysmal nocturnal dyspnea or shortness of breath Dermatological: negative for rash Respiratory: negative for cough or wheezing Urologic: negative for hematuria Abdominal: negative for nausea, vomiting, diarrhea, bright red blood per rectum, melena, or hematemesis Neurologic: negative for visual changes, syncope, or dizziness All other systems reviewed and are otherwise negative except as noted above.    Blood pressure 150/70, pulse 60, height 5' 6.5" (1.689 m), weight  146 lb 14.4 oz (66.633 kg).  General appearance: alert and no distress Neck: no adenopathy, no JVD, supple, symmetrical, trachea midline, thyroid not enlarged, symmetric, no tenderness/mass/nodules and loud carotid bruits bilaterally Lungs: clear to auscultation bilaterally Heart: regular rate and rhythm, S1, S2 normal, no murmur, click, rub or gallop Extremities: extremities normal, atraumatic, no cyanosis or edema and 2+ femorals with loud bruits bilaterally left louder than right  EKG sinus rhythm at 60 with nonspecific ST and T-wave changes  ASSESSMENT AND PLAN:   CORONARY ARTERY DISEASE Coronary artery disease status post coronary artery bypass grafting by Dr. Cornelius Moras 08/30/03 with a LIMA to the distal LAD, RIMA to the circumflex marginal branch and vein graft to the distal right coronary artery. He has not required cardiac catheterization since that time. He had a Myoview stress test performed 08/25/12 which was nonischemic. He denies chest pain or shortness of breath. Recent 2-D echo revealed normal LV function with aortic sclerosis without stenosis.  HYPERLIPIDEMIA On statin therapy with recent blood work performed 01/22/13 revealed a total cholesterol of 101, LDL 46 and HDL of 42.  HYPERTENSION Hypertension well controlled on current medications  Carotid artery disease Status post right carotid endarterectomy performed by Dr. Marcy Panning in 2006. He following respects ultrasound the last one of which was a year ago. We will recheck a carotid Doppler study. He does have loud carotid bruits.  Peripheral arterial disease The patient has a known occluded right external iliac artery status post left to right fem-fem crossover grafting by Dr. Georganna Skeans May of 2000. We have been following his lower extremities but difficult to sound. He briefly complained of worsening claudication and Dr. Alanda Amass obtained arterial Doppler studies revealing ABIs in the mid 0.9 range bilaterally with a patent  fem-fem crossover graft and high-grade disease in his left common and external iliac arteries. He was recently hospitalized with anemia of unknown etiology with a negative workup and was transfused 4 units. His claudication has markedly improved. We'll continue to follow his lower extremity arterial. Doppler studies. Should these progress he will need angiography and potential percutaneous intervention.      Runell Gess MD FACP,FACC,FAHA, [facility] 09/13/2013 10:31 AM

## 2013-09-13 NOTE — Patient Instructions (Signed)
  We will see you back in follow up in 6 months with Dr Allyson Sabal  Dr Allyson Sabal has ordered carotid dopplers to be done in the near future and lower extremity arterial dopplers to be done in 6 months.

## 2013-09-13 NOTE — Assessment & Plan Note (Signed)
Hypertension well controlled on current medications 

## 2013-09-13 NOTE — Assessment & Plan Note (Signed)
Coronary artery disease status post coronary artery bypass grafting by Dr. Cornelius Moras 08/30/03 with a LIMA to the distal LAD, RIMA to the circumflex marginal branch and vein graft to the distal right coronary artery. He has not required cardiac catheterization since that time. He had a Myoview stress test performed 08/25/12 which was nonischemic. He denies chest pain or shortness of breath. Recent 2-D echo revealed normal LV function with aortic sclerosis without stenosis.

## 2013-09-13 NOTE — Assessment & Plan Note (Signed)
On statin therapy with recent blood work performed 01/22/13 revealed a total cholesterol of 101, LDL 46 and HDL of 42.

## 2013-09-13 NOTE — Telephone Encounter (Signed)
Pt returning your call for results

## 2013-09-14 NOTE — Telephone Encounter (Signed)
Yes he may go back to work tomorrow. Note will be written.

## 2013-09-14 NOTE — Telephone Encounter (Signed)
Note is ready and I spoke with pt.  

## 2013-09-17 ENCOUNTER — Ambulatory Visit (INDEPENDENT_AMBULATORY_CARE_PROVIDER_SITE_OTHER): Payer: Medicare Other | Admitting: Family Medicine

## 2013-09-17 ENCOUNTER — Other Ambulatory Visit (INDEPENDENT_AMBULATORY_CARE_PROVIDER_SITE_OTHER): Payer: Medicare Other

## 2013-09-17 DIAGNOSIS — E538 Deficiency of other specified B group vitamins: Secondary | ICD-10-CM

## 2013-09-17 DIAGNOSIS — D649 Anemia, unspecified: Secondary | ICD-10-CM | POA: Diagnosis not present

## 2013-09-17 LAB — CBC WITH DIFFERENTIAL/PLATELET
Basophils Relative: 0.3 % (ref 0.0–3.0)
Eosinophils Relative: 1.5 % (ref 0.0–5.0)
HCT: 34.2 % — ABNORMAL LOW (ref 39.0–52.0)
Hemoglobin: 11.3 g/dL — ABNORMAL LOW (ref 13.0–17.0)
Lymphocytes Relative: 18.9 % (ref 12.0–46.0)
Lymphs Abs: 1.5 10*3/uL (ref 0.7–4.0)
MCV: 91.3 fl (ref 78.0–100.0)
Monocytes Absolute: 0.5 10*3/uL (ref 0.1–1.0)
Monocytes Relative: 6.4 % (ref 3.0–12.0)
Platelets: 174 10*3/uL (ref 150.0–400.0)
RBC: 3.74 Mil/uL — ABNORMAL LOW (ref 4.22–5.81)
WBC: 7.9 10*3/uL (ref 4.5–10.5)

## 2013-09-17 MED ORDER — CYANOCOBALAMIN 1000 MCG/ML IJ SOLN
1000.0000 ug | Freq: Once | INTRAMUSCULAR | Status: AC
  Administered 2013-09-17: 1000 ug via INTRAMUSCULAR

## 2013-09-21 ENCOUNTER — Other Ambulatory Visit: Payer: Self-pay | Admitting: *Deleted

## 2013-09-21 ENCOUNTER — Encounter: Payer: Self-pay | Admitting: Internal Medicine

## 2013-09-21 ENCOUNTER — Ambulatory Visit (INDEPENDENT_AMBULATORY_CARE_PROVIDER_SITE_OTHER): Payer: Medicare Other | Admitting: Internal Medicine

## 2013-09-21 VITALS — BP 140/60 | HR 60 | Ht 65.5 in | Wt 148.0 lb

## 2013-09-21 DIAGNOSIS — R195 Other fecal abnormalities: Secondary | ICD-10-CM

## 2013-09-21 DIAGNOSIS — K5520 Angiodysplasia of colon without hemorrhage: Secondary | ICD-10-CM

## 2013-09-21 DIAGNOSIS — D509 Iron deficiency anemia, unspecified: Secondary | ICD-10-CM | POA: Diagnosis not present

## 2013-09-21 HISTORY — DX: Angiodysplasia of colon without hemorrhage: K55.20

## 2013-09-21 NOTE — Progress Notes (Signed)
  Subjective:    Patient ID: Earl Castillo, male    DOB: 1941-05-04, 72 y.o.   MRN: [id number]  HPI The patient is here for follow-up of iron-deficiency anemia and heme + stool Back to work after RBC + iron infusion and oral iron have increased Hgb to 11. He has not had bleeding, he has had negative colonoscopy (screening 2013) and EGD last month after hospitalized w/ Hgb 4.  Medications, allergies, past medical history, past surgical history, family history and social history are reviewed and updated in the EMR.  Review of Systems As above    Objective:   Physical Exam WDWN NAD    Assessment & Plan:  Iron deficiency anemia, unspecified - Plan: CBC with Differential  Heme positive stool - Plan: CBC with Differential

## 2013-09-21 NOTE — Assessment & Plan Note (Addendum)
Stay on ferrous sulfate Capsule endoscopy of small bowel

## 2013-09-21 NOTE — Patient Instructions (Addendum)
You have been given a separate informational sheet regarding your tobacco use, the importance of quitting and local resources to help you quit.  Your physician has requested that you go to the basement for the following lab work in 6 weeks.  No appointment is needed.  They are open 7:30am-5:30pm.  Today you have been given instructions/appointment for a capsule endoscopy.  I appreciate the opportunity to care for you.

## 2013-09-22 ENCOUNTER — Encounter: Payer: Medicare Other | Admitting: Vascular Surgery

## 2013-09-22 NOTE — Assessment & Plan Note (Signed)
Capsule endoscopy

## 2013-09-23 ENCOUNTER — Ambulatory Visit (HOSPITAL_COMMUNITY)
Admission: RE | Admit: 2013-09-23 | Discharge: 2013-09-23 | Disposition: A | Discharge status: Home / Self Care | Payer: Medicare Other | Source: Ambulatory Visit | Attending: Cardiovascular Disease | Admitting: Cardiovascular Disease

## 2013-09-23 DIAGNOSIS — I6529 Occlusion and stenosis of unspecified carotid artery: Secondary | ICD-10-CM | POA: Diagnosis not present

## 2013-09-23 NOTE — Progress Notes (Signed)
Carotid Duplex Completed. °Brianna L Mazza,RVT °

## 2013-09-24 ENCOUNTER — Ambulatory Visit (INDEPENDENT_AMBULATORY_CARE_PROVIDER_SITE_OTHER): Payer: Medicare Other | Admitting: Family Medicine

## 2013-09-24 DIAGNOSIS — E538 Deficiency of other specified B group vitamins: Secondary | ICD-10-CM

## 2013-09-24 MED ORDER — CYANOCOBALAMIN 1000 MCG/ML IJ SOLN
1000.0000 ug | Freq: Once | INTRAMUSCULAR | Status: AC
  Administered 2013-09-24: 1000 ug via INTRAMUSCULAR

## 2013-09-27 ENCOUNTER — Encounter: Payer: Self-pay | Admitting: *Deleted

## 2013-09-27 ENCOUNTER — Telehealth: Payer: Self-pay | Admitting: *Deleted

## 2013-09-27 DIAGNOSIS — I6529 Occlusion and stenosis of unspecified carotid artery: Secondary | ICD-10-CM

## 2013-09-27 NOTE — Telephone Encounter (Signed)
Order placed for repeat carotid doppler in 1 year 

## 2013-09-27 NOTE — Telephone Encounter (Signed)
Message copied by Marella Bile on Mon Sep 27, 2013  3:23 PM ------      Message from: Runell Gess      Created: Sat Sep 25, 2013  6:26 AM       No change from prior study. Repeat in 12 months. ------

## 2013-10-01 ENCOUNTER — Ambulatory Visit (INDEPENDENT_AMBULATORY_CARE_PROVIDER_SITE_OTHER): Payer: Medicare Other | Admitting: Family Medicine

## 2013-10-01 DIAGNOSIS — E538 Deficiency of other specified B group vitamins: Secondary | ICD-10-CM | POA: Diagnosis not present

## 2013-10-01 MED ORDER — CYANOCOBALAMIN 1000 MCG/ML IJ SOLN
1000.0000 ug | Freq: Once | INTRAMUSCULAR | Status: AC
  Administered 2013-10-01: 1000 ug via INTRAMUSCULAR

## 2013-10-04 ENCOUNTER — Telehealth: Payer: Self-pay

## 2013-10-04 NOTE — Telephone Encounter (Signed)
Spoke to patient and reminded him to be here at 8:00am for his capsule study.  He verbalized understanding.

## 2013-10-04 NOTE — Telephone Encounter (Signed)
Message copied by [place], Darus Hershman E on Mon Oct 04, 2013  1:14 PM ------      Message from: [place], Bonnie Roig E      Created: Tue Sep 21, 2013  6:13 PM       Call and remind patient of his capsule appt. 10/05/13 and cbc 11/02/13 lab appointment. ------

## 2013-10-05 ENCOUNTER — Ambulatory Visit (INDEPENDENT_AMBULATORY_CARE_PROVIDER_SITE_OTHER): Payer: Medicare Other | Admitting: Internal Medicine

## 2013-10-05 DIAGNOSIS — D500 Iron deficiency anemia secondary to blood loss (chronic): Secondary | ICD-10-CM

## 2013-10-05 DIAGNOSIS — K921 Melena: Secondary | ICD-10-CM

## 2013-10-05 HISTORY — PX: OTHER SURGICAL HISTORY: SHX169

## 2013-10-05 NOTE — Progress Notes (Signed)
Pt in this am and states he completed the prep and has had good results. He has not had anything to eat or drink since midnight. Pt swallowed the pill w/o any difficulty and no coughing, strangling and no other problems noted.. Pt staying lying on his side for 30 minutes and no problems were noted. Pt was discharged home with his instructions and numbers to call if necessary.   LOT   2014-30/259558     25  Pt arrived back around 4:15pm stating he had followed all instructions and had no problems. He was instructed to call if he has not passed the pill in around 7 days or if he has any questions or problems. Pt and wife stated understanding. Computer module placed in slot and pictures are being processed.

## 2013-10-07 DIAGNOSIS — Z961 Presence of intraocular lens: Secondary | ICD-10-CM | POA: Diagnosis not present

## 2013-10-08 ENCOUNTER — Telehealth: Payer: Self-pay | Admitting: Family Medicine

## 2013-10-08 ENCOUNTER — Ambulatory Visit (INDEPENDENT_AMBULATORY_CARE_PROVIDER_SITE_OTHER): Payer: Medicare Other | Admitting: Family Medicine

## 2013-10-08 DIAGNOSIS — E538 Deficiency of other specified B group vitamins: Secondary | ICD-10-CM | POA: Diagnosis not present

## 2013-10-08 MED ORDER — CYANOCOBALAMIN 1000 MCG/ML IJ SOLN
1000.0000 ug | Freq: Once | INTRAMUSCULAR | Status: AC
  Administered 2013-10-08: 1000 ug via INTRAMUSCULAR

## 2013-10-08 NOTE — Telephone Encounter (Signed)
Pt has been getting B 12 injections once a week and said that you were going to recommend to start every 2 weeks. Please advise?

## 2013-10-08 NOTE — Telephone Encounter (Signed)
Pt wants to know when to start the B 12 injections 2 times a week? He said that you had discussed this with him, after taking these injections once a week for awhile.

## 2013-10-08 NOTE — Telephone Encounter (Signed)
Have him come in next week for a B12 level, and we can decide from there

## 2013-10-08 NOTE — Telephone Encounter (Signed)
Per Dr. Clent Ridges, change instructions to read, start B 12 injections now every 2 weeks and then recheck a B 12 level in 3 months.

## 2013-10-11 NOTE — Telephone Encounter (Signed)
I left voice message with below information. 

## 2013-10-14 ENCOUNTER — Telehealth: Payer: Self-pay

## 2013-10-14 ENCOUNTER — Encounter: Payer: Self-pay | Admitting: Internal Medicine

## 2013-10-14 DIAGNOSIS — D649 Anemia, unspecified: Secondary | ICD-10-CM

## 2013-10-14 NOTE — Telephone Encounter (Signed)
Message copied by Annett Fabian on Thu Oct 14, 2013  2:47 PM ------      Message from: Stan Head E      Created: Thu Oct 14, 2013  1:03 PM      Regarding: capsule results and plans       He has small bowel AVM's and is leaking blood some from these            1) Is he getting B12 supplements?       2) Is he taking oral iron still?             ------

## 2013-10-14 NOTE — Telephone Encounter (Signed)
Patient advised of the results of the capsule endoscopy.  He is getting B12 weekly for now then q 2 weeks.  He is taking 324 mg daily.

## 2013-10-15 ENCOUNTER — Telehealth: Payer: Self-pay | Admitting: *Deleted

## 2013-10-15 NOTE — Telephone Encounter (Signed)
Left message for patient to call back  

## 2013-10-15 NOTE — Telephone Encounter (Signed)
Daily iron ok Ask him to follow-up with me in late Jan CBC week before or so

## 2013-10-15 NOTE — Telephone Encounter (Signed)
Dr. Leone Payor does he need to go to BID iron?

## 2013-10-15 NOTE — Telephone Encounter (Signed)
Patient advised He wants to call on Monday for an appt when he has his calendar.   He is aware to come for labs one week prior

## 2013-10-15 NOTE — Telephone Encounter (Signed)
lmom for pt to call back

## 2013-10-15 NOTE — Telephone Encounter (Signed)
Message copied by Florene Glen on Fri Oct 15, 2013  4:30 PM ------      Message from: Florene Glen      Created: Wed Oct 06, 2013  2:03 PM       See if he passed pill ------

## 2013-10-18 ENCOUNTER — Encounter: Payer: Self-pay | Admitting: Internal Medicine

## 2013-10-18 NOTE — Telephone Encounter (Signed)
lmom for pt to call back

## 2013-10-18 NOTE — Telephone Encounter (Signed)
Pt reports he never saw the capsule, but he feels he has passed it. He denies pain or problems with his bowels. Explained to him we can do a KUB today, but he would rather wait. Advised him to call for abdominal cramping, constipation, etc; pt stated understanding.

## 2013-10-22 ENCOUNTER — Ambulatory Visit (INDEPENDENT_AMBULATORY_CARE_PROVIDER_SITE_OTHER): Payer: Medicare Other | Admitting: Family Medicine

## 2013-10-22 DIAGNOSIS — E538 Deficiency of other specified B group vitamins: Secondary | ICD-10-CM

## 2013-10-22 MED ORDER — CYANOCOBALAMIN 1000 MCG/ML IJ SOLN
1000.0000 ug | Freq: Once | INTRAMUSCULAR | Status: AC
  Administered 2013-10-22: 1000 ug via INTRAMUSCULAR

## 2013-11-05 ENCOUNTER — Ambulatory Visit (INDEPENDENT_AMBULATORY_CARE_PROVIDER_SITE_OTHER): Payer: Medicare Other | Admitting: Family Medicine

## 2013-11-05 DIAGNOSIS — E538 Deficiency of other specified B group vitamins: Secondary | ICD-10-CM | POA: Diagnosis not present

## 2013-11-05 MED ORDER — CYANOCOBALAMIN 1000 MCG/ML IJ SOLN
1000.0000 ug | Freq: Once | INTRAMUSCULAR | Status: AC
  Administered 2013-11-05: 1000 ug via INTRAMUSCULAR

## 2013-11-17 ENCOUNTER — Other Ambulatory Visit (INDEPENDENT_AMBULATORY_CARE_PROVIDER_SITE_OTHER): Payer: Medicare Other

## 2013-11-17 ENCOUNTER — Other Ambulatory Visit: Payer: Self-pay

## 2013-11-17 DIAGNOSIS — D649 Anemia, unspecified: Secondary | ICD-10-CM

## 2013-11-17 LAB — CBC
HCT: 40.7 % (ref 39.0–52.0)
Hemoglobin: 13.7 g/dL (ref 13.0–17.0)
MCHC: 33.6 g/dL (ref 30.0–36.0)
MCV: 93.6 fl (ref 78.0–100.0)
PLATELETS: 134 10*3/uL — AB (ref 150.0–400.0)
RBC: 4.35 Mil/uL (ref 4.22–5.81)
RDW: 14.8 % — AB (ref 11.5–14.6)
WBC: 8.2 10*3/uL (ref 4.5–10.5)

## 2013-11-17 NOTE — Progress Notes (Signed)
Quick Note:  Hemoglobin now normal, stay on iron at current dose, repeat CBC and a ferritin in 3 months ______

## 2013-11-19 ENCOUNTER — Ambulatory Visit: Payer: Medicare Other

## 2013-11-19 ENCOUNTER — Ambulatory Visit (INDEPENDENT_AMBULATORY_CARE_PROVIDER_SITE_OTHER): Payer: Medicare Other | Admitting: Family Medicine

## 2013-11-19 DIAGNOSIS — E538 Deficiency of other specified B group vitamins: Secondary | ICD-10-CM

## 2013-11-19 MED ORDER — CYANOCOBALAMIN 1000 MCG/ML IJ SOLN
1000.0000 ug | Freq: Once | INTRAMUSCULAR | Status: AC
  Administered 2013-11-19: 1000 ug via INTRAMUSCULAR

## 2013-11-23 ENCOUNTER — Encounter: Payer: Self-pay | Admitting: Internal Medicine

## 2013-11-23 ENCOUNTER — Ambulatory Visit (INDEPENDENT_AMBULATORY_CARE_PROVIDER_SITE_OTHER): Payer: Medicare Other | Admitting: Internal Medicine

## 2013-11-23 VITALS — BP 120/60 | HR 68 | Ht 65.25 in | Wt 145.2 lb

## 2013-11-23 DIAGNOSIS — E538 Deficiency of other specified B group vitamins: Secondary | ICD-10-CM

## 2013-11-23 DIAGNOSIS — K5520 Angiodysplasia of colon without hemorrhage: Secondary | ICD-10-CM

## 2013-11-23 DIAGNOSIS — D500 Iron deficiency anemia secondary to blood loss (chronic): Secondary | ICD-10-CM | POA: Diagnosis not present

## 2013-11-23 HISTORY — DX: Deficiency of other specified B group vitamins: E53.8

## 2013-11-23 NOTE — Patient Instructions (Addendum)
You have been given a separate informational sheet regarding your tobacco use, the importance of quitting and local resources to help you quit.  Please come around April 14th to have lab work drawn, no appointment needed.  The lab is open 7:30AM- 5:30AM.  Follow up with [place] an needed.  I appreciate the opportunity to care for you.

## 2013-11-23 NOTE — Assessment & Plan Note (Signed)
These are the cause of his iron deficiency anemia. Responding to supplemental iron therapy.

## 2013-11-23 NOTE — Assessment & Plan Note (Addendum)
Improved with normal hemoglobin on iron therapy. I believe he has chronic occult blood loss from small bowel angiodysplasia. Plan at this point is chronic iron therapy and CBC about every 3 months.

## 2013-11-23 NOTE — Progress Notes (Signed)
          Subjective:    Patient ID: Earl Castillo, male    DOB: [birth year], 73 y.o.   MRN: [id number]  HPI Patient is here for followup of his iron deficiency anemia from small bowel AVMs. Recent capsule endoscopy study a small bowel has shown these. He feels well at this time it is hoping I gave him the okay to split would. 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   Review of Systems As above, he has B12 deficiency and is getting by weekly injections at this point through Dr. Sarajane Jews    Objective:   Physical Exam Well-developed elderly white man in no acute distress    Assessment & Plan:   1. 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   2. Angiodysplasia of intestine - small bowel    3. Vitamin B12 deficiency    I appreciate the opportunity to care for this patient. CC: [name], MD

## 2013-11-23 NOTE — Assessment & Plan Note (Signed)
Cause not clear, he has been tested for celiac disease and is negative. He is on a biweekly therapy at this time, he is to discuss with Dr. Sarajane Jews about changing the monthly or every 3 months injections.

## 2013-11-30 ENCOUNTER — Ambulatory Visit (INDEPENDENT_AMBULATORY_CARE_PROVIDER_SITE_OTHER): Payer: Medicare Other | Admitting: Cardiovascular Disease

## 2013-11-30 ENCOUNTER — Encounter: Payer: Self-pay | Admitting: Cardiovascular Disease

## 2013-11-30 VITALS — BP 162/70 | HR 62 | Ht 65.25 in | Wt 146.0 lb

## 2013-11-30 DIAGNOSIS — I10 Essential (primary) hypertension: Secondary | ICD-10-CM | POA: Diagnosis not present

## 2013-11-30 DIAGNOSIS — I779 Disorder of arteries and arterioles, unspecified: Secondary | ICD-10-CM

## 2013-11-30 DIAGNOSIS — I2510 Atherosclerotic heart disease of native coronary artery without angina pectoris: Secondary | ICD-10-CM | POA: Diagnosis not present

## 2013-11-30 DIAGNOSIS — E785 Hyperlipidemia, unspecified: Secondary | ICD-10-CM

## 2013-11-30 DIAGNOSIS — Z79899 Other long term (current) drug therapy: Secondary | ICD-10-CM

## 2013-11-30 DIAGNOSIS — I739 Peripheral vascular disease, unspecified: Secondary | ICD-10-CM

## 2013-11-30 NOTE — Assessment & Plan Note (Signed)
Status post coronary artery bypass graft in 2004 by [name] . Myoview stress test performed 08/23/12 was low risk and nonischemic. He denies chest pain or shortness of breath.

## 2013-11-30 NOTE — Assessment & Plan Note (Signed)
On statin therapy. We will recheck a lipid and liver profile 

## 2013-11-30 NOTE — Assessment & Plan Note (Signed)
History of fem-fem crossover graft in by [name] may 2000. He is a known occluded right iliac and high-grade left iliac stenosis by duplex ultrasound. ABIs are in the 0.9 range bilaterally. He does have high-grade distal left SFA disease as well. He was more symptomatic when he was anemic which was ultimately found to be related to angiodysplasia. He'll is mildly symptomatic. We'll continue to follow him by duplex ultrasound. Ultimately he'll require left iliac and SFA intervention.

## 2013-11-30 NOTE — Progress Notes (Signed)
11/30/2013 Earl Castillo   09-Feb-1941  [id number]  Primary Physician Earl Morale, MD Primary Cardiologist: Earl Harp MD Earl Castillo   HPI:  Earl Castillo is a 73 year old mildly overweight married Caucasian male father of 2, grandfather of one grandchild, formally a patient of Earl Castillo. I am assuming his care. He has a long history of ischemic heart disease status post coronary artery bypass grafting in 2004 by Earl Castillo with a LIMA to his LAD, RIMA to the circumflex marginal and vein graft to the distal right coronary artery. He did have a Myoview stress test performed 08/23/12 which was low risk and nonischemic.he has hypertension and hyperlipidemia. He also has diffuse vascular disease status post right carotid endarterectomy performed by Earl Castillo in 2006 which we have been following by duplex ultrasound since. He's had a left to right fem-fem crossover graft by Earl Castillo May of 2000 which we also put on but ultrasound. He was recently admitted with worsening claudication and was found to have profound anemia. GI workup was negative. He was transfused 4 units of packed red blood cells with improvement in his symptoms. Recent lower extremity arterial Doppler studies to show progression of disease in his left common and external iliac artery. In addition he has moderately severe distal left SFA disease. He does have mild lifestyle limiting claudication. Since I saw him last back in November he has remained clinically stable. It was determined that his GI bleed was related to angiodysplasia dysplasia found by a capsule endoscopy followed by Earl Castillo.    Current Outpatient Prescriptions  Medication Sig Dispense Refill  . cyanocobalamin (,VITAMIN B-12,) 1000 MCG/ML injection Inject 1,000 mcg into the muscle once a week.      . docusate sodium (COLACE) 100 MG capsule Take 100 mg by mouth daily.      . ferrous gluconate (FERGON) 324 MG tablet Take 1  tablet (324 mg total) by mouth daily with breakfast.  30 tablet  3  . metoprolol (LOPRESSOR) 50 MG tablet Take 0.5 tablets (25 mg total) by mouth daily.  15 tablet  8  . Needles & Syringes MISC Dispense syringes & needles for intramuscular injection, pt really only needs enough for 3 injections for Vitamin B12.  3 each  0  . neomycin-colistin-hydrocortisone-thonzonium (CORTISPORIN TC) 3.01-04-09-0.5 MG/ML otic suspension Place 4 drops into the left ear 4 (four) times daily as needed (infection).      Marland Kitchen NIFEdipine (PROCARDIA XL/ADALAT-CC) 90 MG 24 hr tablet Take 90 mg by mouth daily.       . simvastatin (ZOCOR) 20 MG tablet Take 1 tablet (20 mg total) by mouth at bedtime.  90 tablet  2   No current facility-administered medications for this visit.    No Known Allergies  History   Social History  . Marital Status: Married    Spouse Name: N/A    Number of Children: 2  . Years of Education: N/A   Occupational History  .  Semi Retired Geophysicist/field seismologist    Social History Main Topics  . Smoking status: Current Every Day Smoker -- 0.25 packs/day for 57 years    Types: Cigarettes  . Smokeless tobacco: Never Used     Comment: smokes 3 a day or less  . Alcohol Use: No  . Drug Use: No  . Sexual Activity: Not on file   Other Topics Concern  . Not on file   Social History Narrative   Daily caffeine  Review of Systems: General: negative for chills, fever, night sweats or weight changes.  Cardiovascular: negative for chest pain, dyspnea on exertion, edema, orthopnea, palpitations, paroxysmal nocturnal dyspnea or shortness of breath Dermatological: negative for rash Respiratory: negative for cough or wheezing Urologic: negative for hematuria Abdominal: negative for nausea, vomiting, diarrhea, bright red blood per rectum, melena, or hematemesis Neurologic: negative for visual changes, syncope, or dizziness All other systems reviewed and are otherwise negative except as noted above.    Blood  pressure 162/70, pulse 62, height 5' 5.25" (1.657 m), weight 146 lb (66.225 kg).  General appearance: alert and no distress Neck: no adenopathy, no JVD, supple, symmetrical, trachea midline, thyroid not enlarged, symmetric, no tenderness/mass/nodules and bbilateral carotid bruits right louder than left Lungs: clear to auscultation bilaterally Heart: regular rate and rhythm, S1, S2 normal, no murmur, click, rub or gallop Extremities: extremities normal, atraumatic, no cyanosis or edema  EKG normal sinus rhythm at 72 with left ventricular hypertrophy and nonspecific ST and T wave changes  ASSESSMENT AND PLAN:   Carotid artery disease History of remote right carotid endarterectomy performed by Earl Castillo in 2006. Recent carotid Dopplers show progression of disease in the right bulb. We'll continue to follow this on an annual basis. He is neurologically asymptomatic.  CORONARY ARTERY DISEASE Status post coronary artery bypass graft in 2004 by Earl Castillo . Myoview stress test performed 08/23/12 was low risk and nonischemic. He denies chest pain or shortness of breath.  HYPERLIPIDEMIA On statin therapy. We will recheck a lipid and liver profile  HYPERTENSION Mildly elevated today on appropriate medications. The patient does admit to using Mucinex this morning  Peripheral arterial disease History of fem-fem crossover graft in by Dr. Amedeo Castillo may 2000. He is a known occluded right iliac and high-grade left iliac stenosis by duplex ultrasound. ABIs are in the 0.9 range bilaterally. He does have high-grade distal left SFA disease as well. He was more symptomatic when he was anemic which was ultimately found to be related to angiodysplasia. He'll is mildly symptomatic. We'll continue to follow him by duplex ultrasound. Ultimately he'll require left iliac and SFA intervention.      Earl Harp MD FACP,FACC,FAHA, [facility] 11/30/2013 4:23 PM

## 2013-11-30 NOTE — Assessment & Plan Note (Signed)
History of remote right carotid endarterectomy performed by Dr. Lindwood Qua in 2006. Recent carotid Dopplers show progression of disease in the right bulb. We'll continue to follow this on an annual basis. He is neurologically asymptomatic.

## 2013-11-30 NOTE — Patient Instructions (Signed)
Your physician wants you to follow-up in: April with Dr [name]. You will receive a reminder letter in the mail two months in advance. If you don't receive a letter, please call our office to schedule the follow-up appointment.  Dr [name] would like for you to have blood work done to check your cholesterol levels.

## 2013-11-30 NOTE — Assessment & Plan Note (Signed)
Mildly elevated today on appropriate medications. The patient does admit to using Mucinex this morning

## 2013-12-03 ENCOUNTER — Ambulatory Visit (INDEPENDENT_AMBULATORY_CARE_PROVIDER_SITE_OTHER): Payer: Medicare Other | Admitting: Family Medicine

## 2013-12-03 ENCOUNTER — Telehealth: Payer: Self-pay | Admitting: Family Medicine

## 2013-12-03 DIAGNOSIS — E785 Hyperlipidemia, unspecified: Secondary | ICD-10-CM | POA: Diagnosis not present

## 2013-12-03 DIAGNOSIS — E538 Deficiency of other specified B group vitamins: Secondary | ICD-10-CM | POA: Diagnosis not present

## 2013-12-03 DIAGNOSIS — Z79899 Other long term (current) drug therapy: Secondary | ICD-10-CM | POA: Diagnosis not present

## 2013-12-03 LAB — LIPID PANEL
Cholesterol: 120 mg/dL (ref 0–200)
HDL: 51 mg/dL (ref >39)
LDL CALC: 56 mg/dL (ref 0–99)
TRIGLYCERIDES: 66 mg/dL (ref <150)
Total CHOL/HDL Ratio: 2.4 Ratio
VLDL: 13 mg/dL (ref 0–40)

## 2013-12-03 LAB — HEPATIC FUNCTION PANEL
ALK PHOS: 67 U/L (ref 39–117)
ALT: 14 U/L (ref 0–53)
AST: 22 U/L (ref 0–37)
Albumin: 4 g/dL (ref 3.5–5.2)
BILIRUBIN DIRECT: 0.1 mg/dL (ref 0.0–0.3)
Indirect Bilirubin: 0.3 mg/dL (ref 0.2–1.2)
TOTAL PROTEIN: 6.9 g/dL (ref 6.0–8.3)
Total Bilirubin: 0.4 mg/dL (ref 0.2–1.2)

## 2013-12-03 MED ORDER — CYANOCOBALAMIN 1000 MCG/ML IJ SOLN
1000.0000 ug | Freq: Once | INTRAMUSCULAR | Status: AC
  Administered 2013-12-03: 1000 ug via INTRAMUSCULAR

## 2013-12-03 NOTE — Telephone Encounter (Signed)
He should stay on once a month injections from now on. He will always needs these. We will check a level once or twice a year from now on

## 2013-12-03 NOTE — Telephone Encounter (Signed)
Pt wants to know how long does he continue with injections, he started out at once per week, then once every 2 weeks, and then it is supposed to start once a month. He is not sure where he is at now with the number of injections?

## 2013-12-03 NOTE — Telephone Encounter (Signed)
Pt aware verbalized understanding and had no questions

## 2013-12-08 ENCOUNTER — Encounter: Payer: Self-pay | Admitting: *Deleted

## 2013-12-17 ENCOUNTER — Ambulatory Visit: Payer: Medicare Other | Admitting: Family Medicine

## 2013-12-27 ENCOUNTER — Telehealth: Payer: Self-pay | Admitting: Internal Medicine

## 2013-12-27 MED ORDER — FERROUS GLUCONATE 324 (38 FE) MG PO TABS: 324.0000 mg | ORAL_TABLET | Freq: Every day | ORAL | Status: DC

## 2013-12-27 NOTE — Telephone Encounter (Signed)
Patient aware iron rx refill sent in as requested.

## 2013-12-31 ENCOUNTER — Ambulatory Visit (INDEPENDENT_AMBULATORY_CARE_PROVIDER_SITE_OTHER): Payer: Medicare Other | Admitting: Family Medicine

## 2013-12-31 DIAGNOSIS — E538 Deficiency of other specified B group vitamins: Secondary | ICD-10-CM | POA: Diagnosis not present

## 2013-12-31 MED ORDER — CYANOCOBALAMIN 1000 MCG/ML IJ SOLN
1000.0000 ug | Freq: Once | INTRAMUSCULAR | Status: AC
  Administered 2013-12-31: 1000 ug via INTRAMUSCULAR

## 2014-01-12 ENCOUNTER — Ambulatory Visit (INDEPENDENT_AMBULATORY_CARE_PROVIDER_SITE_OTHER): Payer: Medicare Other | Admitting: Family Medicine

## 2014-01-12 ENCOUNTER — Encounter: Payer: Self-pay | Admitting: Family Medicine

## 2014-01-12 ENCOUNTER — Telehealth: Payer: Self-pay | Admitting: Family Medicine

## 2014-01-12 VITALS — BP 136/64 | HR 58 | Temp 98.4°F | Ht 65.25 in | Wt 144.0 lb

## 2014-01-12 DIAGNOSIS — J019 Acute sinusitis, unspecified: Secondary | ICD-10-CM | POA: Diagnosis not present

## 2014-01-12 DIAGNOSIS — I2510 Atherosclerotic heart disease of native coronary artery without angina pectoris: Secondary | ICD-10-CM | POA: Diagnosis not present

## 2014-01-12 MED ORDER — AMOXICILLIN-POT CLAVULANATE 875-125 MG PO TABS: 1.0000 | ORAL_TABLET | Freq: Two times a day (BID) | ORAL | Status: DC

## 2014-01-12 NOTE — Progress Notes (Signed)
Pre visit review using our clinic review tool, if applicable. No additional management support is needed unless otherwise documented below in the visit note. 

## 2014-01-12 NOTE — Telephone Encounter (Signed)
Relevant patient education mailed to patient.  

## 2014-01-12 NOTE — Progress Notes (Signed)
   Subjective:    Patient ID: Earl Castillo, male    DOB: Jan 20, 1941, 73 y.o.   MRN: [id number]  HPI Here for one week of sinus pressure, PND, ST, and coughing up yellow sputum.    Review of Systems  Constitutional: Negative.   HENT: Positive for congestion, postnasal drip and sinus pressure.   Eyes: Negative.   Respiratory: Positive for cough.        Objective:   Physical Exam  Constitutional: He appears well-developed and well-nourished.  HENT:  Right Ear: External ear normal.  Left Ear: External ear normal.  Nose: Nose normal.  Mouth/Throat: Oropharynx is clear and moist.  Eyes: Conjunctivae are normal.  Pulmonary/Chest: Effort normal and breath sounds normal.  Lymphadenopathy:    He has no cervical adenopathy.          Assessment & Plan:  Add Mucinex

## 2014-01-18 DIAGNOSIS — L821 Other seborrheic keratosis: Secondary | ICD-10-CM | POA: Diagnosis not present

## 2014-01-18 DIAGNOSIS — Z85828 Personal history of other malignant neoplasm of skin: Secondary | ICD-10-CM | POA: Diagnosis not present

## 2014-01-18 DIAGNOSIS — L570 Actinic keratosis: Secondary | ICD-10-CM | POA: Diagnosis not present

## 2014-01-20 ENCOUNTER — Encounter (HOSPITAL_COMMUNITY): Payer: Medicare Other

## 2014-01-26 ENCOUNTER — Ambulatory Visit (HOSPITAL_COMMUNITY)
Admission: RE | Admit: 2014-01-26 | Discharge: 2014-01-26 | Disposition: A | Discharge status: Home / Self Care | Payer: Medicare Other | Source: Ambulatory Visit | Attending: Cardiovascular Disease | Admitting: Cardiovascular Disease

## 2014-01-26 DIAGNOSIS — I739 Peripheral vascular disease, unspecified: Secondary | ICD-10-CM

## 2014-01-26 NOTE — Progress Notes (Signed)
Lower Extremity Arterial Duplex Completed. °Brianna L Mazza,RVT °

## 2014-01-28 ENCOUNTER — Ambulatory Visit (INDEPENDENT_AMBULATORY_CARE_PROVIDER_SITE_OTHER): Payer: Medicare Other | Admitting: Family Medicine

## 2014-01-28 DIAGNOSIS — E538 Deficiency of other specified B group vitamins: Secondary | ICD-10-CM

## 2014-01-28 MED ORDER — CYANOCOBALAMIN 1000 MCG/ML IJ SOLN
1000.0000 ug | Freq: Once | INTRAMUSCULAR | Status: AC
  Administered 2014-01-28: 1000 ug via INTRAMUSCULAR

## 2014-02-10 ENCOUNTER — Encounter: Payer: Self-pay | Admitting: Internal Medicine

## 2014-02-15 ENCOUNTER — Telehealth: Payer: Self-pay

## 2014-02-15 NOTE — Telephone Encounter (Signed)
Patient notified to come for repeat blood work.  He states he will come this week

## 2014-02-15 NOTE — Telephone Encounter (Signed)
Message copied by Marlon Pel on Tue Feb 15, 2014 11:28 AM ------      Message from: Marlon Pel      Created: Wed Nov 17, 2013  1:53 PM       Needs labs- see results 11/17/13 ------

## 2014-02-16 ENCOUNTER — Other Ambulatory Visit (INDEPENDENT_AMBULATORY_CARE_PROVIDER_SITE_OTHER): Payer: Medicare Other

## 2014-02-16 ENCOUNTER — Other Ambulatory Visit: Payer: Self-pay

## 2014-02-16 DIAGNOSIS — D649 Anemia, unspecified: Secondary | ICD-10-CM

## 2014-02-16 LAB — CBC WITH DIFFERENTIAL/PLATELET
BASOS PCT: 0.4 % (ref 0.0–3.0)
Basophils Absolute: 0 10*3/uL (ref 0.0–0.1)
EOS PCT: 2.2 % (ref 0.0–5.0)
Eosinophils Absolute: 0.2 10*3/uL (ref 0.0–0.7)
HEMATOCRIT: 42.4 % (ref 39.0–52.0)
HEMOGLOBIN: 14.5 g/dL (ref 13.0–17.0)
LYMPHS ABS: 1.8 10*3/uL (ref 0.7–4.0)
Lymphocytes Relative: 20.6 % (ref 12.0–46.0)
MCHC: 34.1 g/dL (ref 30.0–36.0)
MCV: 97.7 fl (ref 78.0–100.0)
MONO ABS: 0.5 10*3/uL (ref 0.1–1.0)
Monocytes Relative: 6.2 % (ref 3.0–12.0)
Neutro Abs: 6.2 10*3/uL (ref 1.4–7.7)
Neutrophils Relative %: 70.6 % (ref 43.0–77.0)
Platelets: 150 10*3/uL (ref 150.0–400.0)
RBC: 4.34 Mil/uL (ref 4.22–5.81)
RDW: 14.2 % (ref 11.5–14.6)
WBC: 8.8 10*3/uL (ref 4.5–10.5)

## 2014-02-16 LAB — FERRITIN: FERRITIN: 50.6 ng/mL (ref 22.0–322.0)

## 2014-02-16 NOTE — Progress Notes (Signed)
Quick Note:  Hgb nL and so is iron now Needs to stay on a daily iron pill Cbc in 4 months ______

## 2014-02-23 ENCOUNTER — Encounter: Payer: Self-pay | Admitting: Cardiovascular Disease

## 2014-02-24 ENCOUNTER — Encounter: Payer: Self-pay | Admitting: Cardiovascular Disease

## 2014-02-24 ENCOUNTER — Ambulatory Visit (INDEPENDENT_AMBULATORY_CARE_PROVIDER_SITE_OTHER): Payer: Medicare Other | Admitting: Cardiovascular Disease

## 2014-02-24 VITALS — BP 140/62 | HR 52 | Ht 66.0 in | Wt 145.0 lb

## 2014-02-24 DIAGNOSIS — R5381 Other malaise: Secondary | ICD-10-CM | POA: Diagnosis not present

## 2014-02-24 DIAGNOSIS — D689 Coagulation defect, unspecified: Secondary | ICD-10-CM | POA: Diagnosis not present

## 2014-02-24 DIAGNOSIS — I2510 Atherosclerotic heart disease of native coronary artery without angina pectoris: Secondary | ICD-10-CM

## 2014-02-24 DIAGNOSIS — Z01818 Encounter for other preprocedural examination: Secondary | ICD-10-CM

## 2014-02-24 DIAGNOSIS — R5383 Other fatigue: Secondary | ICD-10-CM | POA: Diagnosis not present

## 2014-02-24 DIAGNOSIS — Z79899 Other long term (current) drug therapy: Secondary | ICD-10-CM | POA: Diagnosis not present

## 2014-02-24 DIAGNOSIS — I739 Peripheral vascular disease, unspecified: Secondary | ICD-10-CM

## 2014-02-24 DIAGNOSIS — I779 Disorder of arteries and arterioles, unspecified: Secondary | ICD-10-CM

## 2014-02-24 NOTE — Patient Instructions (Addendum)
Your physician has requested that you have a carotid duplex within the next month. This test is an ultrasound of the carotid arteries in your neck. It looks at blood flow through these arteries that supply the brain with blood. Allow one hour for this exam. There are no restrictions or special instructions.  [name] has ordered a peripheral angiogram to be done at [facility].  This procedure is going to look at the bloodflow in your lower extremities.  If [name] is able to open up the arteries, you will have to spend one night in the hospital.  If he is not able to open the arteries, you will be able to go home that same day.    After the procedure, you will not be allowed to drive for 3 days or push, pull, or lift anything greater than 10 lbs for one week.    You will be required to have bloodwork and a chest xray prior to your procedure.  Our scheduler will advise you on when these items need to be done.   Please have this done at any [facility] lab.  Left groin access

## 2014-02-24 NOTE — Assessment & Plan Note (Signed)
Status post remote left to right fem-fem crossover graft and by Dr. Drucie Opitz in 2000. He complains of left limiting claudication. This was recent Dopplers performed 01/26/14 revealed a right ABI of 0.87 the left of 0.82. He has high-frequency signals in his left common and external iliac artery as well as his left SFA. Plan is to perform angiography and potential percutaneous intervention.

## 2014-02-24 NOTE — Assessment & Plan Note (Signed)
Well-controlled on current medications 

## 2014-02-24 NOTE — Assessment & Plan Note (Signed)
History of right carotid endarterectomy in 2006 by [name]. Would follow his Doppler semiannually. They were last done in November and her schedule to he done next month

## 2014-02-24 NOTE — Assessment & Plan Note (Signed)
On statin therapy with his most recent lipid profile performed 12/03/13 revealing a total cholesterol of 120, LDL of 56 an HDL of 51

## 2014-02-24 NOTE — Progress Notes (Signed)
02/24/2014 Earl Castillo   1941-06-26  [id number]  Primary Physician [name], MD Primary Cardiologist: [name] MD [name]   HPI:  Earl Castillo is a 73 year old mildly overweight married Caucasian male father of 2, grandfather of one grandchild, formally a patient of Dr. Terance Ice. I am assuming his care. He has a long history of ischemic heart disease status post coronary artery bypass grafting in 2004 by [name] with a LIMA to his LAD, RIMA to the circumflex marginal and vein graft to the distal right coronary artery. He did have a Myoview stress test performed 08/23/12 which was low risk and nonischemic.he has hypertension and hyperlipidemia. He also has diffuse vascular disease status post right carotid endarterectomy performed by [name] in 2006 which we have been following by duplex ultrasound since. He's had a left to right fem-fem crossover graft by Dr. Drucie Opitz May of 2000 which we also put on but ultrasound. He was recently admitted with worsening claudication and was found to have profound anemia. GI workup was negative. He was transfused 4 units of packed red blood cells with improvement in his symptoms. Recent lower extremity arterial Doppler studies to show progression of disease in his left common and external iliac artery. In addition he has moderately severe distal left SFA disease.  Since I saw him last back in January he has remained clinically stable. It was determined that his GI bleed was related to angiodysplasia dysplasia found by a capsule endoscopy followed by [name]. He complains of progressive lifestyle limiting claudication bilaterally. Based on this I decided to proceed with angiography and potential percutaneous intervention.    Current Outpatient Prescriptions  Medication Sig Dispense Refill  . cyanocobalamin (,VITAMIN B-12,) 1000 MCG/ML injection Inject 1,000 mcg into the muscle every 30 (thirty) days.       Marland Kitchen  docusate sodium (COLACE) 100 MG capsule Take 100 mg by mouth every other day.       . ferrous gluconate (FERGON) 324 MG tablet Take 1 tablet (324 mg total) by mouth daily with breakfast.  30 tablet  3  . metoprolol (LOPRESSOR) 50 MG tablet Take 0.5 tablets (25 mg total) by mouth daily.  15 tablet  8  . neomycin-colistin-hydrocortisone-thonzonium (CORTISPORIN TC) 3.01-04-09-0.5 MG/ML otic suspension Place 4 drops into the left ear 4 (four) times daily as needed (infection).      Marland Kitchen NIFEdipine (PROCARDIA XL/ADALAT-CC) 90 MG 24 hr tablet Take 90 mg by mouth daily.       . simvastatin (ZOCOR) 20 MG tablet Take 1 tablet (20 mg total) by mouth at bedtime.  90 tablet  2  . amoxicillin-clavulanate (AUGMENTIN) 875-125 MG per tablet Take 1 tablet by mouth 2 (two) times daily.  20 tablet  0   No current facility-administered medications for this visit.    No Known Allergies  History   Social History  . Marital Status: Married    Spouse Name: N/A    Number of Children: 2  . Years of Education: N/A   Occupational History  .  Semi Retired Geophysicist/field seismologist    Social History Main Topics  . Smoking status: Current Every Day Smoker -- 0.25 packs/day for 57 years    Types: Cigarettes  . Smokeless tobacco: Never Used     Comment: smokes 3 a day or less  . Alcohol Use: No  . Drug Use: No  . Sexual Activity: Not on file   Other Topics Concern  .  Not on file   Social History Narrative   Daily caffeine      Review of Systems: General: negative for chills, fever, night sweats or weight changes.  Cardiovascular: negative for chest pain, dyspnea on exertion, edema, orthopnea, palpitations, paroxysmal nocturnal dyspnea or shortness of breath Dermatological: negative for rash Respiratory: negative for cough or wheezing Urologic: negative for hematuria Abdominal: negative for nausea, vomiting, diarrhea, bright red blood per rectum, melena, or hematemesis Neurologic: negative for visual changes, syncope, or  dizziness All other systems reviewed and are otherwise negative except as noted above.    Blood pressure 140/62, pulse 52, height 5\' 6"  (1.676 m), weight 145 lb (65.772 kg).  General appearance: alert and no distress Neck: no adenopathy, no JVD, supple, symmetrical, trachea midline, thyroid not enlarged, symmetric, no tenderness/mass/nodules and bilateral carotid bruits right much louder than left Lungs: clear to auscultation bilaterally Heart: regular rate and rhythm, S1, S2 normal, no murmur, click, rub or gallop Extremities: extremities normal, atraumatic, no cyanosis or edema and + left femoral with a rubbery  EKG not performed today  ASSESSMENT AND PLAN:   Peripheral arterial disease Status post remote left to right fem-fem crossover graft and by Dr. Drucie Opitz in 2000. He complains of left limiting claudication. This was recent Dopplers performed 01/26/14 revealed a right ABI of 0.87 the left of 0.82. He has high-frequency signals in his left common and external iliac artery as well as his left SFA. Plan is to perform angiography and potential percutaneous intervention.  HYPERLIPIDEMIA On statin therapy with his most recent lipid profile performed 12/03/13 revealing a total cholesterol of 120, LDL of 56 an HDL of 51  HYPERTENSION Well-controlled on current medications  Carotid artery disease History of right carotid endarterectomy in 2006 by [name]. Would follow his Doppler semiannually. They were last done in November and her schedule to he done next month      [name] MD [facility], [facility] 02/24/2014 9:26 AM

## 2014-02-25 ENCOUNTER — Ambulatory Visit (INDEPENDENT_AMBULATORY_CARE_PROVIDER_SITE_OTHER): Payer: Medicare Other | Admitting: Family Medicine

## 2014-02-25 DIAGNOSIS — E538 Deficiency of other specified B group vitamins: Secondary | ICD-10-CM | POA: Diagnosis not present

## 2014-02-25 MED ORDER — CYANOCOBALAMIN 1000 MCG/ML IJ SOLN
1000.0000 ug | Freq: Once | INTRAMUSCULAR | Status: AC
  Administered 2014-02-25: 1000 ug via INTRAMUSCULAR

## 2014-02-28 ENCOUNTER — Ambulatory Visit (HOSPITAL_COMMUNITY)
Admission: RE | Admit: 2014-02-28 | Discharge: 2014-02-28 | Disposition: A | Discharge status: Home / Self Care | Payer: Medicare Other | Source: Ambulatory Visit | Attending: Cardiovascular Disease | Admitting: Cardiovascular Disease

## 2014-02-28 DIAGNOSIS — I6529 Occlusion and stenosis of unspecified carotid artery: Secondary | ICD-10-CM | POA: Diagnosis not present

## 2014-02-28 DIAGNOSIS — I779 Disorder of arteries and arterioles, unspecified: Secondary | ICD-10-CM | POA: Diagnosis not present

## 2014-02-28 DIAGNOSIS — I739 Peripheral vascular disease, unspecified: Secondary | ICD-10-CM

## 2014-02-28 NOTE — Progress Notes (Signed)
Carotid Duplex Completed. Saarah Dewing, BS, RDMS, RVT  

## 2014-03-04 ENCOUNTER — Encounter: Payer: Self-pay | Admitting: Family Medicine

## 2014-03-04 ENCOUNTER — Ambulatory Visit (INDEPENDENT_AMBULATORY_CARE_PROVIDER_SITE_OTHER): Payer: Medicare Other | Admitting: Family Medicine

## 2014-03-04 VITALS — BP 140/60 | Temp 98.0°F | Ht 66.0 in | Wt 146.0 lb

## 2014-03-04 DIAGNOSIS — I2510 Atherosclerotic heart disease of native coronary artery without angina pectoris: Secondary | ICD-10-CM | POA: Diagnosis not present

## 2014-03-04 DIAGNOSIS — N138 Other obstructive and reflux uropathy: Secondary | ICD-10-CM | POA: Diagnosis not present

## 2014-03-04 DIAGNOSIS — J019 Acute sinusitis, unspecified: Secondary | ICD-10-CM

## 2014-03-04 DIAGNOSIS — N401 Enlarged prostate with lower urinary tract symptoms: Secondary | ICD-10-CM | POA: Diagnosis not present

## 2014-03-04 DIAGNOSIS — I739 Peripheral vascular disease, unspecified: Secondary | ICD-10-CM | POA: Diagnosis not present

## 2014-03-04 DIAGNOSIS — M1990 Unspecified osteoarthritis, unspecified site: Secondary | ICD-10-CM | POA: Diagnosis not present

## 2014-03-04 DIAGNOSIS — N139 Obstructive and reflux uropathy, unspecified: Secondary | ICD-10-CM

## 2014-03-04 DIAGNOSIS — I10 Essential (primary) hypertension: Secondary | ICD-10-CM

## 2014-03-04 DIAGNOSIS — E785 Hyperlipidemia, unspecified: Secondary | ICD-10-CM

## 2014-03-04 LAB — PSA: PSA: 0.62 ng/mL (ref 0.10–4.00)

## 2014-03-04 MED ORDER — AMOXICILLIN-POT CLAVULANATE 875-125 MG PO TABS: 1.0000 | ORAL_TABLET | Freq: Two times a day (BID) | ORAL | Status: DC

## 2014-03-04 NOTE — Progress Notes (Signed)
Pre visit review using our clinic review tool, if applicable. No additional management support is needed unless otherwise documented below in the visit note. 

## 2014-03-04 NOTE — Progress Notes (Signed)
   Subjective:    Patient ID: Earl Castillo, male    DOB: Dec 01, 1940, 73 y.o.   MRN: [id number]  HPI 73 yr old male for a cpx. He feels well except for some sinus problems that started a week ago, including pressure, PND, and blowing green mucus from the nose.    Review of Systems  Constitutional: Negative.   HENT: Positive for congestion, postnasal drip and sinus pressure. Negative for dental problem, ear pain and nosebleeds.   Eyes: Negative.   Respiratory: Negative.   Cardiovascular: Negative.   Gastrointestinal: Negative.   Genitourinary: Negative.   Musculoskeletal: Negative.   Skin: Negative.   Neurological: Negative.   Psychiatric/Behavioral: Negative.        Objective:   Physical Exam  Constitutional: He is oriented to person, place, and time. He appears well-developed and well-nourished. No distress.  HENT:  Head: Normocephalic and atraumatic.  Right Ear: External ear normal.  Left Ear: External ear normal.  Nose: Nose normal.  Mouth/Throat: Oropharynx is clear and moist. No oropharyngeal exudate.  Eyes: Conjunctivae and EOM are normal. Pupils are equal, round, and reactive to light. Right eye exhibits no discharge. Left eye exhibits no discharge. No scleral icterus.  Neck: Neck supple. No JVD present. No tracheal deviation present. No thyromegaly present.  Cardiovascular: Normal rate, regular rhythm, normal heart sounds and intact distal pulses.  Exam reveals no gallop and no friction rub.   No murmur heard. Pulmonary/Chest: Effort normal and breath sounds normal. No respiratory distress. He has no wheezes. He has no rales. He exhibits no tenderness.  Abdominal: Soft. Bowel sounds are normal. He exhibits no distension and no mass. There is no tenderness. There is no rebound and no guarding.  Genitourinary: Rectum normal, prostate normal and penis normal. Guaiac negative stool. No penile tenderness.  Musculoskeletal: Normal range of motion. He exhibits no edema and no  tenderness.  Lymphadenopathy:    He has no cervical adenopathy.  Neurological: He is alert and oriented to person, place, and time. He has normal reflexes. No cranial nerve deficit. He exhibits normal muscle tone. Coordination normal.  Skin: Skin is warm and dry. No rash noted. He is not diaphoretic. No erythema. No pallor.  Psychiatric: He has a normal mood and affect. His behavior is normal. Judgment and thought content normal.          Assessment & Plan:  Well exam. He will be having a lot of labs drawn soon per [name] so we can review these. Get a PSA today. Treat the sinusitis with Augmentin.

## 2014-03-07 ENCOUNTER — Telehealth: Payer: Self-pay | Admitting: Family Medicine

## 2014-03-07 NOTE — Telephone Encounter (Signed)
Relevant patient education mailed to patient.  

## 2014-03-09 ENCOUNTER — Encounter (HOSPITAL_COMMUNITY): Payer: Self-pay | Admitting: Pharmacy Technician

## 2014-03-10 ENCOUNTER — Telehealth: Payer: Self-pay | Admitting: Cardiovascular Disease

## 2014-03-10 ENCOUNTER — Other Ambulatory Visit: Payer: Self-pay | Admitting: *Deleted

## 2014-03-10 DIAGNOSIS — I739 Peripheral vascular disease, unspecified: Secondary | ICD-10-CM

## 2014-03-10 DIAGNOSIS — Z01818 Encounter for other preprocedural examination: Secondary | ICD-10-CM

## 2014-03-10 NOTE — Telephone Encounter (Signed)
Returning your call. °

## 2014-03-10 NOTE — Telephone Encounter (Signed)
Patient notified of carotid dopplers results and is agreeable to proceed with having a carotid angio at the time of the lower ext angio.

## 2014-03-10 NOTE — Telephone Encounter (Signed)
The cath lab schedule has been adjusted

## 2014-03-14 DIAGNOSIS — R5381 Other malaise: Secondary | ICD-10-CM | POA: Diagnosis not present

## 2014-03-14 DIAGNOSIS — R5383 Other fatigue: Secondary | ICD-10-CM | POA: Diagnosis not present

## 2014-03-14 DIAGNOSIS — Z01818 Encounter for other preprocedural examination: Secondary | ICD-10-CM | POA: Diagnosis not present

## 2014-03-14 DIAGNOSIS — Z79899 Other long term (current) drug therapy: Secondary | ICD-10-CM | POA: Diagnosis not present

## 2014-03-14 DIAGNOSIS — D689 Coagulation defect, unspecified: Secondary | ICD-10-CM | POA: Diagnosis not present

## 2014-03-14 LAB — BASIC METABOLIC PANEL
BUN: 17 mg/dL (ref 6–23)
CHLORIDE: 108 meq/L (ref 96–112)
CO2: 26 mEq/L (ref 19–32)
Calcium: 9 mg/dL (ref 8.4–10.5)
Creat: 0.92 mg/dL (ref 0.50–1.35)
Glucose, Bld: 96 mg/dL (ref 70–99)
POTASSIUM: 3.8 meq/L (ref 3.5–5.3)
Sodium: 143 mEq/L (ref 135–145)

## 2014-03-14 LAB — CBC
HCT: 40.5 % (ref 39.0–52.0)
Hemoglobin: 14.1 g/dL (ref 13.0–17.0)
MCH: 32.9 pg (ref 26.0–34.0)
MCHC: 34.8 g/dL (ref 30.0–36.0)
MCV: 94.6 fL (ref 78.0–100.0)
PLATELETS: 137 10*3/uL — AB (ref 150–400)
RBC: 4.28 MIL/uL (ref 4.22–5.81)
RDW: 13.3 % (ref 11.5–15.5)
WBC: 8 10*3/uL (ref 4.0–10.5)

## 2014-03-14 LAB — PROTIME-INR
INR: 1.12 (ref <1.50)
Prothrombin Time: 14.3 seconds (ref 11.6–15.2)

## 2014-03-14 LAB — APTT: APTT: 35 s (ref 24–37)

## 2014-03-14 LAB — TSH: TSH: 1.019 u[IU]/mL (ref 0.350–4.500)

## 2014-03-16 ENCOUNTER — Encounter: Payer: Self-pay | Admitting: *Deleted

## 2014-03-17 ENCOUNTER — Encounter (HOSPITAL_COMMUNITY)
Admission: RE | Disposition: A | Discharge status: Home / Self Care | Payer: Self-pay | Source: Ambulatory Visit | Attending: Cardiovascular Disease

## 2014-03-17 ENCOUNTER — Ambulatory Visit (HOSPITAL_COMMUNITY)
Admission: RE | Admit: 2014-03-17 | Discharge: 2014-03-17 | Disposition: A | Discharge status: Home / Self Care | Payer: Medicare Other | Source: Ambulatory Visit | Attending: Cardiovascular Disease | Admitting: Cardiovascular Disease

## 2014-03-17 DIAGNOSIS — E785 Hyperlipidemia, unspecified: Secondary | ICD-10-CM | POA: Insufficient documentation

## 2014-03-17 DIAGNOSIS — I70219 Atherosclerosis of native arteries of extremities with intermittent claudication, unspecified extremity: Secondary | ICD-10-CM | POA: Insufficient documentation

## 2014-03-17 DIAGNOSIS — I6529 Occlusion and stenosis of unspecified carotid artery: Secondary | ICD-10-CM | POA: Diagnosis not present

## 2014-03-17 DIAGNOSIS — I739 Peripheral vascular disease, unspecified: Secondary | ICD-10-CM

## 2014-03-17 DIAGNOSIS — I10 Essential (primary) hypertension: Secondary | ICD-10-CM | POA: Insufficient documentation

## 2014-03-17 DIAGNOSIS — I259 Chronic ischemic heart disease, unspecified: Secondary | ICD-10-CM | POA: Insufficient documentation

## 2014-03-17 DIAGNOSIS — I731 Thromboangiitis obliterans [Buerger's disease]: Secondary | ICD-10-CM

## 2014-03-17 DIAGNOSIS — Z01818 Encounter for other preprocedural examination: Secondary | ICD-10-CM

## 2014-03-17 DIAGNOSIS — Z951 Presence of aortocoronary bypass graft: Secondary | ICD-10-CM | POA: Insufficient documentation

## 2014-03-17 DIAGNOSIS — F17200 Nicotine dependence, unspecified, uncomplicated: Secondary | ICD-10-CM | POA: Insufficient documentation

## 2014-03-17 HISTORY — PX: LOWER EXTREMITY ANGIOGRAM: SHX5508

## 2014-03-17 HISTORY — PX: CAROTID ANGIOGRAM: SHX5504

## 2014-03-17 SURGERY — ANGIOGRAM, LOWER EXTREMITY
Anesthesia: LOCAL

## 2014-03-17 MED ORDER — HEPARIN (PORCINE) IN NACL 2-0.9 UNIT/ML-% IJ SOLN
INTRAMUSCULAR | Status: AC
  Filled 2014-03-17: qty 1000

## 2014-03-17 MED ORDER — SODIUM CHLORIDE 0.9 % IJ SOLN: 3.0000 mL | INTRAMUSCULAR | Status: DC | PRN

## 2014-03-17 MED ORDER — SODIUM CHLORIDE 0.9 % IV SOLN: INTRAVENOUS | Status: DC

## 2014-03-17 MED ORDER — MORPHINE SULFATE 2 MG/ML IJ SOLN: 2.0000 mg | INTRAMUSCULAR | Status: DC | PRN

## 2014-03-17 MED ORDER — ASPIRIN 81 MG PO CHEW
CHEWABLE_TABLET | ORAL | Status: AC
  Administered 2014-03-17: 81 mg via ORAL
  Filled 2014-03-17: qty 1

## 2014-03-17 MED ORDER — HYDRALAZINE HCL 20 MG/ML IJ SOLN: 10.0000 mg | INTRAMUSCULAR | Status: DC

## 2014-03-17 MED ORDER — ASPIRIN 81 MG PO CHEW
81.0000 mg | CHEWABLE_TABLET | ORAL | Status: AC
  Administered 2014-03-17: 81 mg via ORAL

## 2014-03-17 MED ORDER — LIDOCAINE HCL (PF) 1 % IJ SOLN
INTRAMUSCULAR | Status: AC
  Filled 2014-03-17: qty 30

## 2014-03-17 MED ORDER — SODIUM CHLORIDE 0.9 % IV SOLN
INTRAVENOUS | Status: DC
  Administered 2014-03-17: 11:00:00 via INTRAVENOUS

## 2014-03-17 NOTE — Progress Notes (Signed)
UP AND WALKED AND TOL WELL LEFT GROIN STABLE NO BLEEDING OR HEMATOMA

## 2014-03-17 NOTE — Discharge Instructions (Signed)
Arteriogram °Care After °These instructions give you information on caring for yourself after your procedure. Your doctor may also give you more specific instructions. Call your doctor if you have any problems or questions after your procedure. °HOME CARE °· Stay in bed the rest of the day. °· Keep your leg straight for at least 6 hours. °· Do not lift anything heavier than 10 pounds (about a gallon of milk) for 2 days. °· Do not walk a lot, run, or drive for 2 days. °· Return to normal activities in 2 days or as told by your doctor. °Finding out the results of your test °Ask when your test results will be ready. Make sure you get your test results. °GET HELP RIGHT AWAY IF:  °· You have fever of 102° F (38.9° C) or higher. °· You have more pain in your leg. °· The leg that was cut is: °· Bleeding. °· Puffy (swollen) or red. °· Cold. °· Pale or changes color. °· Weak. °· Tingly or numb. °If you go to the Emergency Room, tell your nurse that you have had an arteriogram. Take this paper with you to show the nurse. °MAKE SURE YOU: °· Understand these instructions. °· Will watch your condition. °· Will get help right away if you are not doing well or get worse. °Document Released: 01/17/2009 Document Revised: 01/13/2012 Document Reviewed: 01/17/2009 °ExitCare® Patient Information ©2014 [employer]. ° °

## 2014-03-17 NOTE — CV Procedure (Signed)
Earl Castillo is a 73 y.o. male    [id number] LOCATION:  FACILITY: [facility]  PHYSICIAN: Quay Burow, M.D. 1941-07-31   DATE OF PROCEDURE:  03/17/2014  DATE OF DISCHARGE:     PV Angiogram/Intervention    History obtained from chart review.Earl Castillo is a 74 year old mildly overweight married Caucasian male father of 2, grandfather of one grandchild, formally a patient of Dr. Terance Ice. I am assuming his care. He has a long history of ischemic heart disease status post coronary artery bypass grafting in 2004 by [name] with a LIMA to his LAD, RIMA to the circumflex marginal and vein graft to the distal right coronary artery. He did have a Myoview stress test performed 08/23/12 which was low risk and nonischemic.he has hypertension and hyperlipidemia. He also has diffuse vascular disease status post right carotid endarterectomy performed by [name] in 2006 which we have been following by duplex ultrasound since. He's had a left to right fem-fem crossover graft by Dr. Drucie Opitz May of 2000 which we also put on but ultrasound. He was recently admitted with worsening claudication and was found to have profound anemia. GI workup was negative. He was transfused 4 units of packed red blood cells with improvement in his symptoms. Recent lower extremity arterial Doppler studies to show progression of disease in his left common and external iliac artery. In addition he has moderately severe distal left SFA disease. Since I saw him last back in January he has remained clinically stable. It was determined that his GI bleed was related to angiodysplasia dysplasia found by a capsule endoscopy followed by [name]. He complains of progressive lifestyle limiting claudication bilaterally. Based on this I decided to proceed with angiography and potential percutaneous intervention.    PROCEDURE DESCRIPTION:   The patient was brought to the second floor [facility] cath lab in the  postabsorptive state. He was not Premedicated. Marland Kitchen His left groinwas prepped and shaved in usual sterile fashion. Xylocaine 1% was used  for local anesthesia. A 5 French sheath was inserted into the left common femoral artery using standard Seldinger technique and a micropuncture needle. A 5 French pigtail catheter was used to perform aortic arch angiography, distal abdominal aortography, bilateral iliac angiography with bifemoral runoff using bolus chase digital subtraction step table technique. A 5 French JB1 catheter was used to perform selective right carotid angiography. Visipaque just for the entirety of the case (200 cc administered the patient). Retrograde aortic pressure was monitored during the case.   HEMODYNAMICS:    AO SYSTOLIC/AO DIASTOLIC: 381/82   Angiographic Data:   1: Aortic arch angiogram-type I arch, there was a 50% ostial left common carotid artery stenosis  2: Right carotid angiogram-there was a 50% hypodense distal right common carotid artery stenosis just before the patch.  3: Abdominal aortogram-the renal arteries appear patent. The infrarenal abdominal aorta had mild atherosclerosis without aneurysmal changes.  4: Left lower extremity-the left iliac appeared widely patent with minor irregularities. There is a 50% proximal eccentric left SFA stenosis. There was a 99% focal calcified mid to distal left SFA stenosis with three-vessel runoff. The left to right fem-fem crossover graft was widely patent  5: Right lower extremity-the right external iliac artery was occluded. The right lower extremity filled via the left to right fem-fem crossover graft. The SFA was patent and there was three-vessel runoff  IMPRESSION:Earl Castillo has a 50% distal right common carotid artery stenosis. We'll continue to follow this I did  ultrasound. Hysterogram crossover graft is widely patent. His left iliac appeared patent as well suggesting falsely elevated velocities by duplex. He did have a  high-grade focal calcified mid to distal left SFA stenosis corresponding to an area of abnormality and height velocity is in the duplex ultrasound with three-vessel runoff. He does complain of left calf claudication. This is amenable to diamondback orbital rotational atherectomy, plus or minus PTA and stenting. The sheath was removed and pressure was held on the groin to achieve hemostasis. The patient will remain recumbent for 4 hours and be gently hydrated. He'll be discharged home and I will see him back in the office to discuss staged intervention.    [name]. MD, [facility] 03/17/2014 1:53 PM

## 2014-03-17 NOTE — H&P (View-Only) (Signed)
02/24/2014 Earl Castillo   1941-06-26  [id number]  Primary Physician [name], MD Primary Cardiologist: [name] MD [name]   HPI:  [name] is a 73 year old mildly overweight married Caucasian male father of 2, grandfather of one grandchild, formally a patient of Dr. Terance Ice. I am assuming his care. He has a long history of ischemic heart disease status post coronary artery bypass grafting in 2004 by [name] with a LIMA to his LAD, RIMA to the circumflex marginal and vein graft to the distal right coronary artery. He did have a Myoview stress test performed 08/23/12 which was low risk and nonischemic.he has hypertension and hyperlipidemia. He also has diffuse vascular disease status post right carotid endarterectomy performed by [name] in 2006 which we have been following by duplex ultrasound since. He's had a left to right fem-fem crossover graft by Dr. Drucie Opitz May of 2000 which we also put on but ultrasound. He was recently admitted with worsening claudication and was found to have profound anemia. GI workup was negative. He was transfused 4 units of packed red blood cells with improvement in his symptoms. Recent lower extremity arterial Doppler studies to show progression of disease in his left common and external iliac artery. In addition he has moderately severe distal left SFA disease.  Since I saw him last back in January he has remained clinically stable. It was determined that his GI bleed was related to angiodysplasia dysplasia found by a capsule endoscopy followed by [name]. He complains of progressive lifestyle limiting claudication bilaterally. Based on this I decided to proceed with angiography and potential percutaneous intervention.    Current Outpatient Prescriptions  Medication Sig Dispense Refill  . cyanocobalamin (,VITAMIN B-12,) 1000 MCG/ML injection Inject 1,000 mcg into the muscle every 30 (thirty) days.       Marland Kitchen  docusate sodium (COLACE) 100 MG capsule Take 100 mg by mouth every other day.       . ferrous gluconate (FERGON) 324 MG tablet Take 1 tablet (324 mg total) by mouth daily with breakfast.  30 tablet  3  . metoprolol (LOPRESSOR) 50 MG tablet Take 0.5 tablets (25 mg total) by mouth daily.  15 tablet  8  . neomycin-colistin-hydrocortisone-thonzonium (CORTISPORIN TC) 3.01-04-09-0.5 MG/ML otic suspension Place 4 drops into the left ear 4 (four) times daily as needed (infection).      Marland Kitchen NIFEdipine (PROCARDIA XL/ADALAT-CC) 90 MG 24 hr tablet Take 90 mg by mouth daily.       . simvastatin (ZOCOR) 20 MG tablet Take 1 tablet (20 mg total) by mouth at bedtime.  90 tablet  2  . amoxicillin-clavulanate (AUGMENTIN) 875-125 MG per tablet Take 1 tablet by mouth 2 (two) times daily.  20 tablet  0   No current facility-administered medications for this visit.    No Known Allergies  History   Social History  . Marital Status: Married    Spouse Name: N/A    Number of Children: 2  . Years of Education: N/A   Occupational History  .  Semi Retired Geophysicist/field seismologist    Social History Main Topics  . Smoking status: Current Every Day Smoker -- 0.25 packs/day for 57 years    Types: Cigarettes  . Smokeless tobacco: Never Used     Comment: smokes 3 a day or less  . Alcohol Use: No  . Drug Use: No  . Sexual Activity: Not on file   Other Topics Concern  .  Not on file   Social History Narrative   Daily caffeine      Review of Systems: General: negative for chills, fever, night sweats or weight changes.  Cardiovascular: negative for chest pain, dyspnea on exertion, edema, orthopnea, palpitations, paroxysmal nocturnal dyspnea or shortness of breath Dermatological: negative for rash Respiratory: negative for cough or wheezing Urologic: negative for hematuria Abdominal: negative for nausea, vomiting, diarrhea, bright red blood per rectum, melena, or hematemesis Neurologic: negative for visual changes, syncope, or  dizziness All other systems reviewed and are otherwise negative except as noted above.    Blood pressure 140/62, pulse 52, height 5\' 6"  (1.676 m), weight 145 lb (65.772 kg).  General appearance: alert and no distress Neck: no adenopathy, no JVD, supple, symmetrical, trachea midline, thyroid not enlarged, symmetric, no tenderness/mass/nodules and bilateral carotid bruits right much louder than left Lungs: clear to auscultation bilaterally Heart: regular rate and rhythm, S1, S2 normal, no murmur, click, rub or gallop Extremities: extremities normal, atraumatic, no cyanosis or edema and + left femoral with a rubbery  EKG not performed today  ASSESSMENT AND PLAN:   Peripheral arterial disease Status post remote left to right fem-fem crossover graft and by Dr. Drucie Opitz in 2000. He complains of left limiting claudication. This was recent Dopplers performed 01/26/14 revealed a right ABI of 0.87 the left of 0.82. He has high-frequency signals in his left common and external iliac artery as well as his left SFA. Plan is to perform angiography and potential percutaneous intervention.  HYPERLIPIDEMIA On statin therapy with his most recent lipid profile performed 12/03/13 revealing a total cholesterol of 120, LDL of 56 an HDL of 51  HYPERTENSION Well-controlled on current medications  Carotid artery disease History of right carotid endarterectomy in 2006 by [name]. Would follow his Doppler semiannually. They were last done in November and her schedule to he done next month      [name] MD [facility], [facility] 02/24/2014 9:26 AM

## 2014-03-17 NOTE — Interval H&P Note (Signed)
History and Physical Interval Note:  03/17/2014 1:07 PM  Earl Castillo  has presented today for surgery, with the diagnosis of claudication  The various methods of treatment have been discussed with the patient and family. After consideration of risks, benefits and other options for treatment, the patient has consented to  Procedure(s): LOWER EXTREMITY ANGIOGRAM (N/A) CAROTID ANGIOGRAM (N/A) as a surgical intervention .  The patient's history has been reviewed, patient examined, no change in status, stable for surgery.  I have reviewed the patient's chart and labs.  Questions were answered to the patient's satisfaction.     [name]

## 2014-03-21 ENCOUNTER — Telehealth: Payer: Self-pay | Admitting: Cardiovascular Disease

## 2014-03-21 NOTE — Telephone Encounter (Signed)
Had a procedure last Thursday at the hospital. Pt need a note for work stating when he can go back to work.

## 2014-03-21 NOTE — Telephone Encounter (Signed)
Will defer to [name] and Curt Bears.

## 2014-03-22 ENCOUNTER — Other Ambulatory Visit: Payer: Self-pay | Admitting: *Deleted

## 2014-03-22 MED ORDER — SIMVASTATIN 20 MG PO TABS: 20.0000 mg | ORAL_TABLET | Freq: Every day | ORAL | Status: DC

## 2014-03-22 NOTE — Telephone Encounter (Signed)
Patient has an upcoming appt on 6/9.  Letter drafted.  Patient will pick up tomorrow

## 2014-03-22 NOTE — Telephone Encounter (Signed)
He can go back to work but needs a ROV scheduled to discuss DB atherectomy LSFA.  JJB

## 2014-03-22 NOTE — Telephone Encounter (Signed)
Spoke with wife.  Patient works part time for [employer].  He drives the transport car. He doesn't do any lifting, pushing or pulling.   Will defer to Dr [name]

## 2014-04-01 ENCOUNTER — Ambulatory Visit (INDEPENDENT_AMBULATORY_CARE_PROVIDER_SITE_OTHER): Payer: Medicare Other | Admitting: Family Medicine

## 2014-04-01 DIAGNOSIS — E538 Deficiency of other specified B group vitamins: Secondary | ICD-10-CM | POA: Diagnosis not present

## 2014-04-01 MED ORDER — CYANOCOBALAMIN 1000 MCG/ML IJ SOLN
1000.0000 ug | Freq: Once | INTRAMUSCULAR | Status: AC
  Administered 2014-04-01: 1000 ug via INTRAMUSCULAR

## 2014-04-04 ENCOUNTER — Telehealth: Payer: Self-pay | Admitting: Cardiovascular Disease

## 2014-04-04 MED ORDER — NIFEDIPINE ER OSMOTIC RELEASE 90 MG PO TB24: 90.0000 mg | ORAL_TABLET | Freq: Every day | ORAL | Status: DC

## 2014-04-04 NOTE — Telephone Encounter (Signed)
Rx was sent to pharmacy electronically. 

## 2014-04-04 NOTE — Telephone Encounter (Signed)
Need refill on his Nifedipine ER 90 mg please.

## 2014-04-12 ENCOUNTER — Other Ambulatory Visit: Payer: Self-pay | Admitting: *Deleted

## 2014-04-12 ENCOUNTER — Ambulatory Visit (INDEPENDENT_AMBULATORY_CARE_PROVIDER_SITE_OTHER): Payer: Medicare Other | Admitting: Cardiology

## 2014-04-12 ENCOUNTER — Encounter: Payer: Self-pay | Admitting: Cardiovascular Disease

## 2014-04-12 ENCOUNTER — Encounter: Payer: Self-pay | Admitting: Cardiology

## 2014-04-12 VITALS — BP 162/70 | HR 72 | Ht 66.0 in | Wt 147.0 lb

## 2014-04-12 DIAGNOSIS — I2510 Atherosclerotic heart disease of native coronary artery without angina pectoris: Secondary | ICD-10-CM | POA: Diagnosis not present

## 2014-04-12 DIAGNOSIS — Z79899 Other long term (current) drug therapy: Secondary | ICD-10-CM

## 2014-04-12 DIAGNOSIS — R5381 Other malaise: Secondary | ICD-10-CM | POA: Diagnosis not present

## 2014-04-12 DIAGNOSIS — Z01812 Encounter for preprocedural laboratory examination: Secondary | ICD-10-CM

## 2014-04-12 DIAGNOSIS — I739 Peripheral vascular disease, unspecified: Secondary | ICD-10-CM | POA: Diagnosis not present

## 2014-04-12 DIAGNOSIS — R5383 Other fatigue: Secondary | ICD-10-CM | POA: Diagnosis not present

## 2014-04-12 DIAGNOSIS — D689 Coagulation defect, unspecified: Secondary | ICD-10-CM | POA: Diagnosis not present

## 2014-04-12 NOTE — Patient Instructions (Signed)
1. We are going to schedule a diamondback procedure on your left leg  2. Start taking ASA 81 mg daily

## 2014-04-12 NOTE — Addendum Note (Signed)
Addended by: Dolan Amen on: 04/12/2014 03:46 PM   Modules accepted: Orders

## 2014-04-12 NOTE — Progress Notes (Signed)
Patient ID: Earl Castillo, male   DOB: [birth year], 73 y.o.   MRN: [id number]    04/12/2014 Chandler Swiderski Chesney   1941-05-05  [id number]  Primary Physicia [name], MD Primary Cardiologist: [name]  The patient presents to clinic for post cath followup after undergoing a diagnostic PV angiogram.  HPI:  The patient is a 73 year old white male previously followed by [name] and now followed by [name]. His past medical history is significant for CAD, status post coronary artery bypass grafting in 2004 by [name], with a LIMA to his LAD, RIMA to the circumflex marginal and SVG to the distal RCA. He had a Myoview stress test performed 08/23/2012 which was low risk and nonischemic. He also has hypertension and hyperlipidemia. He has a 60+ year history of tobacco use and continues to smoke daily. He has significant peripheral vascular disease. He is status post right carotid endarterectomy performed by [name] in 2006. We have been following his carotid disease yearly by duplex ultrasound. He has also had a left to right fem-fem crossover graft by [name] in May of 2000. He was recently seen by [name] and complained of worsening claudication. Recent lower extremity arterial Doppler studies demonstrated progression of disease in his left common and external iliac arteries. In addition, it was suggested that he had moderately severe distal left SFA disease. Subsequently, on 03/17/2014 he underwent a diagnostic PV angiogram performed by [name]. His fem-fem crossover graft was widely patent. His left iliac also appeared  widely patent, suggesting falsely elevated velocities by duplex. He did have a high grade focal calcified mid to distal left SFA stenosis corresponding to an area of abnormality and high velocity in the duplex ultrasound with three-vessel runoff. [name] recommended intervention with diamondback orbital rotational atherectomy, plus or minus PTA and stenting, at a later  date. He was discharged home and he now presents back to clinic for post hospital followup.  He states that he has been doing fairly well since discharge but he continues to note significant lifestyle limiting left lower extremity claudication. He denies any anterior groin, flank or low back pain, where the cath was performed. He also denies any chest pain or shortness of breath. He also denies any left extremity  ulcers and sores.   Current Outpatient Prescriptions  Medication Sig Dispense Refill  . acetaminophen (TYLENOL) 500 MG tablet Take 1,000 mg by mouth 2 (two) times daily as needed for headache (leg pain).      . cyanocobalamin (,VITAMIN B-12,) 1000 MCG/ML injection Inject 1,000 mcg into the muscle every 30 (thirty) days. Last injection 02/25/14      . docusate sodium (COLACE) 100 MG capsule Take 100 mg by mouth daily as needed (constipation).       . ferrous gluconate (FERGON) 324 MG tablet Take 324 mg by mouth daily after supper.      . metoprolol (LOPRESSOR) 50 MG tablet Take 0.5 tablets (25 mg total) by mouth daily.  15 tablet  8  . neomycin-colistin-hydrocortisone-thonzonium (CORTISPORIN TC) 3.01-04-09-0.5 MG/ML otic suspension Place 4 drops into the left ear 4 (four) times daily as needed (itching).       Marland Kitchen NIFEdipine (PROCARDIA XL/ADALAT-CC) 90 MG 24 hr tablet Take 1 tablet (90 mg total) by mouth daily.  90 tablet  1  . simvastatin (ZOCOR) 20 MG tablet Take 1 tablet (20 mg total) by mouth daily after supper.  90 tablet  2   No current  facility-administered medications for this visit.    No Known Allergies  History   Social History  . Marital Status: Married    Spouse Name: N/A    Number of Children: 2  . Years of Education: N/A   Occupational History  .  Semi Retired Geophysicist/field seismologist    Social History Main Topics  . Smoking status: Current Every Day Smoker -- 0.25 packs/day for 57 years    Types: Cigarettes  . Smokeless tobacco: Never Used     Comment: smokes 3 a day or less  .  Alcohol Use: No  . Drug Use: No  . Sexual Activity: Not on file   Other Topics Concern  . Not on file   Social History Narrative   Daily caffeine      Review of Systems: General: negative for chills, fever, night sweats or weight changes.  Cardiovascular: negative for chest pain, dyspnea on exertion, edema, orthopnea, palpitations, paroxysmal nocturnal dyspnea or shortness of breath Dermatological: negative for rash Respiratory: negative for cough or wheezing Urologic: negative for hematuria Abdominal: negative for nausea, vomiting, diarrhea, bright red blood per rectum, melena, or hematemesis Neurologic: negative for visual changes, syncope, or dizziness All other systems reviewed and are otherwise negative except as noted above.    Blood pressure 162/70, pulse 72, height 5\' 6"  (1.676 m), weight 147 lb (66.679 kg).  General appearance: alert, cooperative and no distress Neck: no JVD and bilateral carotid bruits, R> L Lungs: clear to auscultation bilaterally Heart: regular rate and rhythm, S1, S2 normal, no murmur, click, rub or gallop Extremities: no LEE Pulses: 2+ radial pulses. Decreased DPs on the left. Skin: warm and dry Neurologic: Grossly normal  EKG No performed  ASSESSMENT AND PLAN:   1: PVD: His recent diagnostic PV angiogram revealed a high grade focal calcified mid to distal left SFA stenosis, corresponding to an area of abnormality and height velocity in the duplex ultrasound with three-vessel runoff. He continues to complain of left calf claudication. [name] has recommended diamondback orbital rotational atherectomy, plus or minus PTA and stenting. We will arrange for this to be done at [facility] in several weeks. He has been instructed to start taking low-dose, 81 mg, of aspirin daily. He has also been encouraged to discontinue tobacco use. We will arrange for the diamondback representative to be present at time of this intervention. [name] has  recommended micropuncture over the fem-fem cross over graft.  2. Post Catheterization: His femoral access site remains stable, free from hematoma and bruit. He denies anterior growing, flank and low back pain.  3. Tobacco abuse: He notes a 60+ years history and admits to ongoing tobacco use. Complete smoking cessation was advised.  PLAN  Will plan for intervention of his left SFA stenosis with [name] at [facility] in several weeks. He has been instructed to start taking 81 mg of aspirin daily.  Janila Arrazola SimmonsPA-C 04/12/2014 3:04 PM

## 2014-04-13 ENCOUNTER — Telehealth: Payer: Self-pay | Admitting: Cardiovascular Disease

## 2014-04-13 NOTE — Telephone Encounter (Signed)
Spoke with Sherron Ales @ 7:40 am to inform him of case that is scheduled for the patient on Monday 05/02/14 @ 9:00 am.

## 2014-04-26 ENCOUNTER — Telehealth: Payer: Self-pay | Admitting: Cardiovascular Disease

## 2014-04-26 ENCOUNTER — Encounter (HOSPITAL_COMMUNITY): Payer: Self-pay | Admitting: Pharmacy Technician

## 2014-04-26 DIAGNOSIS — D689 Coagulation defect, unspecified: Secondary | ICD-10-CM | POA: Diagnosis not present

## 2014-04-26 DIAGNOSIS — R5383 Other fatigue: Secondary | ICD-10-CM | POA: Diagnosis not present

## 2014-04-26 DIAGNOSIS — R5381 Other malaise: Secondary | ICD-10-CM | POA: Diagnosis not present

## 2014-04-26 DIAGNOSIS — Z01812 Encounter for preprocedural laboratory examination: Secondary | ICD-10-CM | POA: Diagnosis not present

## 2014-04-26 DIAGNOSIS — Z79899 Other long term (current) drug therapy: Secondary | ICD-10-CM | POA: Diagnosis not present

## 2014-04-26 LAB — COMPREHENSIVE METABOLIC PANEL
ALK PHOS: 70 U/L (ref 39–117)
ALT: 8 U/L (ref 0–53)
AST: 16 U/L (ref 0–37)
Albumin: 4 g/dL (ref 3.5–5.2)
BILIRUBIN TOTAL: 0.5 mg/dL (ref 0.2–1.2)
BUN: 17 mg/dL (ref 6–23)
CO2: 28 mEq/L (ref 19–32)
Calcium: 9.3 mg/dL (ref 8.4–10.5)
Chloride: 108 mEq/L (ref 96–112)
Creat: 0.94 mg/dL (ref 0.50–1.35)
GLUCOSE: 94 mg/dL (ref 70–99)
Potassium: 3.9 mEq/L (ref 3.5–5.3)
Sodium: 143 mEq/L (ref 135–145)
Total Protein: 6.6 g/dL (ref 6.0–8.3)

## 2014-04-26 LAB — CBC WITH DIFFERENTIAL/PLATELET
Basophils Absolute: 0 10*3/uL (ref 0.0–0.1)
Basophils Relative: 0 % (ref 0–1)
Eosinophils Absolute: 0.2 10*3/uL (ref 0.0–0.7)
Eosinophils Relative: 3 % (ref 0–5)
HEMATOCRIT: 43.1 % (ref 39.0–52.0)
HEMOGLOBIN: 15 g/dL (ref 13.0–17.0)
LYMPHS ABS: 1.7 10*3/uL (ref 0.7–4.0)
Lymphocytes Relative: 23 % (ref 12–46)
MCH: 33 pg (ref 26.0–34.0)
MCHC: 34.8 g/dL (ref 30.0–36.0)
MCV: 94.7 fL (ref 78.0–100.0)
MONO ABS: 0.5 10*3/uL (ref 0.1–1.0)
MONOS PCT: 6 % (ref 3–12)
Neutro Abs: 5.1 10*3/uL (ref 1.7–7.7)
Neutrophils Relative %: 68 % (ref 43–77)
Platelets: 162 10*3/uL (ref 150–400)
RBC: 4.55 MIL/uL (ref 4.22–5.81)
RDW: 13.9 % (ref 11.5–15.5)
WBC: 7.5 10*3/uL (ref 4.0–10.5)

## 2014-04-26 LAB — TSH: TSH: 1.152 u[IU]/mL (ref 0.350–4.500)

## 2014-04-26 LAB — PROTIME-INR
INR: 1.05 (ref <1.50)
PROTHROMBIN TIME: 13.6 s (ref 11.6–15.2)

## 2014-04-26 LAB — APTT: APTT: 35 s (ref 24–37)

## 2014-04-26 NOTE — Telephone Encounter (Signed)
Spoke with Randall Hiss @ 8:17 am for Monday 05/02/14 @ 7:30 am for Khoi Kamel's  procedure at [facility].

## 2014-04-27 LAB — URINALYSIS, MICROSCOPIC ONLY
Bacteria, UA: NONE SEEN
Casts: NONE SEEN
Squamous Epithelial / LPF: NONE SEEN

## 2014-04-28 ENCOUNTER — Other Ambulatory Visit (HOSPITAL_COMMUNITY): Payer: Self-pay | Admitting: Internal Medicine

## 2014-04-28 NOTE — Telephone Encounter (Signed)
Please advise regarding refill Sir, thank you. 

## 2014-04-28 NOTE — Telephone Encounter (Signed)
Refill x 3 

## 2014-04-29 ENCOUNTER — Ambulatory Visit: Payer: Medicare Other | Admitting: Family Medicine

## 2014-04-29 ENCOUNTER — Telehealth: Payer: Self-pay | Admitting: Family Medicine

## 2014-04-29 ENCOUNTER — Encounter: Payer: Self-pay | Admitting: Internal Medicine

## 2014-04-29 ENCOUNTER — Ambulatory Visit (INDEPENDENT_AMBULATORY_CARE_PROVIDER_SITE_OTHER): Payer: Medicare Other | Admitting: Internal Medicine

## 2014-04-29 VITALS — BP 180/90 | HR 68 | Temp 98.7°F | Wt 144.0 lb

## 2014-04-29 DIAGNOSIS — I2510 Atherosclerotic heart disease of native coronary artery without angina pectoris: Secondary | ICD-10-CM | POA: Diagnosis not present

## 2014-04-29 DIAGNOSIS — I10 Essential (primary) hypertension: Secondary | ICD-10-CM

## 2014-04-29 DIAGNOSIS — R1110 Vomiting, unspecified: Secondary | ICD-10-CM | POA: Diagnosis not present

## 2014-04-29 DIAGNOSIS — E538 Deficiency of other specified B group vitamins: Secondary | ICD-10-CM | POA: Diagnosis not present

## 2014-04-29 DIAGNOSIS — I739 Peripheral vascular disease, unspecified: Secondary | ICD-10-CM | POA: Diagnosis not present

## 2014-04-29 DIAGNOSIS — K5900 Constipation, unspecified: Secondary | ICD-10-CM

## 2014-04-29 DIAGNOSIS — E785 Hyperlipidemia, unspecified: Secondary | ICD-10-CM | POA: Diagnosis not present

## 2014-04-29 MED ORDER — ONDANSETRON 8 MG PO TBDP: 8.0000 mg | ORAL_TABLET | Freq: Three times a day (TID) | ORAL | Status: DC | PRN

## 2014-04-29 MED ORDER — CYANOCOBALAMIN 1000 MCG/ML IJ SOLN
1000.0000 ug | Freq: Once | INTRAMUSCULAR | Status: AC
  Administered 2014-04-29: 1000 ug via INTRAMUSCULAR

## 2014-04-29 MED ORDER — PROMETHAZINE HCL 25 MG/ML IJ SOLN
25.0000 mg | INTRAMUSCULAR | Status: AC
  Administered 2014-04-29: 50 mg via INTRAMUSCULAR

## 2014-04-29 NOTE — Progress Notes (Signed)
Pre visit review using our clinic review tool, if applicable. No additional management support is needed unless otherwise documented below in the visit note. 

## 2014-04-29 NOTE — Telephone Encounter (Signed)
Patient Information:  Caller Name: Marlowe Kays  Phone: [phone]  Patient: Earl Castillo, Earl Castillo  Gender: Male  DOB: 06/12/1941  Age: 73 Years  PCP: [facility])  Office Follow Up:  Does the office need to follow up with this patient?: Yes  Instructions For The Office: Please call his wife and advise.  RN Note:  RN sent information to MD /and advised pts wife we would call back.  Symptoms  Reason For Call & Symptoms: Pts wife is calling to say he seems very sedated from the Phenergan. Pt had an injection of Phenergan 50mg  IM today around 1pm at the office. His wife states he has been sleeping since. He can wake up. He can sit up but he can't get up off the couch. He is coherent. RN advised his wife this can be a side effect of Phenergan. She wants to let the MD know. No rash or any other signs of allergy noted/just the sedation.  Reviewed Health History In EMR: Yes  Reviewed Medications In EMR: Yes  Reviewed Allergies In EMR: Yes  Reviewed Surgeries / Procedures: Yes  Date of Onset of Symptoms: 04/29/2014  Guideline(s) Used:  No Protocol Available - Sick Adult  Disposition Per Guideline:   Discuss with PCP and Callback by Nurse within 1 Hour  Reason For Disposition Reached:   Nursing judgment  Advice Given:  N/A  Patient Will Follow Care Advice:  YES

## 2014-04-29 NOTE — Patient Instructions (Signed)
Drink clear liquids only for the next 24 hours,  then slowly add other liquids and food as you  tolerate them  Take medicines to control nausea has directed  Hold iron therapy  Call or return to clinic prn if these symptoms worsen or fail to improve as anticipated.

## 2014-04-29 NOTE — Progress Notes (Signed)
Subjective:    Patient ID: Earl Castillo, male    DOB: 08/04/1941, 73 y.o.   MRN: [id number]  HPI  73 year old patient, who presents with a three-day history of headache and since midnight last night, intractable nausea and vomiting.  He has constipation tendencies since starting oral iron has not had a bowel movement in 3 days.  His coronary artery disease PAD and history of chronic GI bleed.  Denies any abdominal pain or distention.  Past Medical History  Diagnosis Date  . Hyperlipidemia   . Hypertension   . Chicken pox   . Blood transfusion abn reaction or complication, no procedure mishap   . Coronary artery disease     sees [name]  . Osteoarthritis   . Nephrolithiasis   . Skin exam, screening for cancer     sees [name]   . Hemorrhoid   . GERD (gastroesophageal reflux disease)   . Colon polyps 08/2012    Hyperplastic and adenomatous sessile polyps  . H/O: GI bleed 08/2013  . Anemia   . Carotid artery disease     status post carotid endarterectomy by [name]  . Peripheral arterial disease     status post left to right fem-fem crossover graft in by [name]  . Angiodysplasia of intestine - small bowel  09/21/2013  . 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 Vitamin B12 deficiency 11/23/2013    History   Social History  . Marital Status: Married    Spouse Name: N/A    Number of Children: 2  . Years of Education: N/A   Occupational History  .  Semi Retired Geophysicist/field seismologist    Social History Main Topics  . Smoking status: Current Every Day Smoker -- 0.25 packs/day for 57 years    Types: Cigarettes  . Smokeless tobacco: Never Used     Comment: smokes 3 a day or less  . Alcohol Use: No  . Drug Use: No  . Sexual Activity: Not on file   Other Topics Concern  . Not on file   Social History Narrative   Daily caffeine     Past Surgical History  Procedure Laterality Date  . Neck surgery   1978  . Bypass rt leg  2000  . Coronary artery bypass graft  2003  . Right carotid endarterectomy    . Basket extraction of ureteral stone  1970's  . Basal cell carcinoma excision  May 2009    per [name] , left nose   . Colonoscopy  08-12-12    per [name], tubular adenomas, repeat in 5 yrs   . Cataract extraction  2014    Bil.  per [name]  . Esophagogastroduodenoscopy N/A 08/17/2013    Procedure: ESOPHAGOGASTRODUODENOSCOPY (EGD);  Surgeon: [name], MD;  [facility];  Service: Endoscopy;  Laterality: N/A;  . Cardiac catheterization  08/18/2003    Recommend CABG  . Coronary artery bypass graft  08/30/2003    x3. LIMA-distal LAD, RIMA-circumflex, SVG-distal RCA  . Carotid endarterectomy Right 03/26/2005    Dr Georgina Quint  . Cardiovascular stress test - lexiscan  08/25/2012    No significant wall motion abnormalities  . Transthoracic echocardiogram  08/02/2013    EF 55-60%, normal echocardiogram  . Renal duplex  02/02/2013    Bilateral renal arteries demonstrate narrowingconsistent with a 60-99% diameter reduction.  . Capsule endoscopy  10-05-13    per  [name], 4 AVM's    Family History  Problem Relation Age of Onset  . Colon cancer Neg Hx   . Heart disease Father     No Known Allergies  Current Outpatient Prescriptions on File Prior to Visit  Medication Sig Dispense Refill  . acetaminophen (TYLENOL) 500 MG tablet Take 1,000 mg by mouth daily as needed for mild pain (leg pain).       Marland Kitchen aspirin EC 81 MG tablet Take 81 mg by mouth daily.      . cyanocobalamin (,VITAMIN B-12,) 1000 MCG/ML injection Inject 1,000 mcg into the muscle every 30 (thirty) days. Last injection 02/25/14      . docusate sodium (COLACE) 100 MG capsule Take 100 mg by mouth daily as needed (constipation).       . ferrous gluconate (FERGON) 324 MG tablet TAKE 1 TABLET BY MOUTH EVERY DAY WITH BREAKFAST  30 tablet  2  . metoprolol (LOPRESSOR) 50 MG tablet Take 25 mg by mouth  daily.      Marland Kitchen neomycin-colistin-hydrocortisone-thonzonium (CORTISPORIN TC) 3.01-04-09-0.5 MG/ML otic suspension Place 4 drops into the left ear 4 (four) times daily as needed (itching).       Marland Kitchen NIFEdipine (PROCARDIA XL/ADALAT-CC) 90 MG 24 hr tablet Take 90 mg by mouth daily.      . simvastatin (ZOCOR) 20 MG tablet Take 20 mg by mouth daily after supper.       No current facility-administered medications on file prior to visit.    BP 180/90  Pulse 68  Temp(Src) 98.7 F (37.1 C) (Oral)  Wt 144 lb (65.318 kg)       Review of Systems  Constitutional: Negative for fever, chills, appetite change and fatigue.  HENT: Negative for congestion, dental problem, ear pain, hearing loss, sore throat, tinnitus, trouble swallowing and voice change.   Eyes: Negative for pain, discharge and visual disturbance.  Respiratory: Negative for cough, chest tightness, wheezing and stridor.   Cardiovascular: Negative for chest pain, palpitations and leg swelling.  Gastrointestinal: Positive for nausea, vomiting and constipation. Negative for abdominal pain, diarrhea, blood in stool and abdominal distention.  Genitourinary: Negative for urgency, hematuria, flank pain, discharge, difficulty urinating and genital sores.  Musculoskeletal: Negative for arthralgias, back pain, gait problem, joint swelling, myalgias and neck stiffness.  Skin: Negative for rash.  Neurological: Positive for headaches. Negative for dizziness, syncope, speech difficulty, weakness and numbness.  Hematological: Negative for adenopathy. Does not bruise/bleed easily.  Psychiatric/Behavioral: Negative for behavioral problems and dysphoric mood. The patient is not nervous/anxious.        Objective:   Physical Exam  Constitutional: He is oriented to person, place, and time. He appears well-developed.  Blood pressure 160/84  HENT:  Head: Normocephalic.  Right Ear: External ear normal.  Left Ear: External ear normal.  Eyes: Conjunctivae  and EOM are normal.  Neck: Normal range of motion.  Right carotid endarterectomy scar With high pitched  bruit  Cardiovascular: Normal rate and normal heart sounds.   Pulmonary/Chest: Effort normal and breath sounds normal.  Abdominal: Bowel sounds are normal.  Abdominal bruit;  flat, nontender, active bowel sounds  Musculoskeletal: Normal range of motion. He exhibits no edema and no tenderness.  Neurological: He is alert and oriented to person, place, and time.  Psychiatric: He has a normal mood and affect. His behavior is normal.          Assessment & Plan:   Nausea, vomiting Constipation  We'll treat with Phenergan 50 mg IM  and call in a prescription for Zofran.  Was nausea, control.  We'll treat  the  constipation issues and temporally put  iron therapy on hold  B12 deficiency.  Injectable B 12 given

## 2014-04-30 NOTE — Telephone Encounter (Signed)
Called and spoke with pt's wife and pt is still nauseous and pt had been drinking a lot trying to stay hydrated.  Pt seems to be doing better today- more coherent.

## 2014-04-30 NOTE — Telephone Encounter (Signed)
Did you talk with Dr. Raliegh Ip about this on Friday?

## 2014-04-30 NOTE — Telephone Encounter (Signed)
Pt has been able to get up and walk to the restroom and back.

## 2014-05-02 ENCOUNTER — Encounter (HOSPITAL_COMMUNITY): Admission: RE | Payer: Self-pay | Source: Ambulatory Visit

## 2014-05-02 ENCOUNTER — Ambulatory Visit (HOSPITAL_COMMUNITY): Admission: RE | Admit: 2014-05-02 | Payer: Medicare Other | Source: Ambulatory Visit | Admitting: Cardiovascular Disease

## 2014-05-02 SURGERY — ANGIOGRAM, LOWER EXTREMITY
Anesthesia: LOCAL

## 2014-05-02 NOTE — Telephone Encounter (Signed)
Pt was here in office on 04/29/14 and saw Dr. Raliegh Ip.

## 2014-05-03 ENCOUNTER — Encounter: Payer: Self-pay | Admitting: Physician Assistant

## 2014-05-03 ENCOUNTER — Ambulatory Visit (INDEPENDENT_AMBULATORY_CARE_PROVIDER_SITE_OTHER): Payer: Medicare Other | Admitting: Physician Assistant

## 2014-05-03 VITALS — BP 126/70 | HR 61 | Temp 98.2°F | Resp 18 | Wt 142.0 lb

## 2014-05-03 DIAGNOSIS — J359 Chronic disease of tonsils and adenoids, unspecified: Secondary | ICD-10-CM

## 2014-05-03 DIAGNOSIS — J019 Acute sinusitis, unspecified: Secondary | ICD-10-CM | POA: Diagnosis not present

## 2014-05-03 DIAGNOSIS — I2510 Atherosclerotic heart disease of native coronary artery without angina pectoris: Secondary | ICD-10-CM | POA: Diagnosis not present

## 2014-05-03 MED ORDER — AMOXICILLIN-POT CLAVULANATE 875-125 MG PO TABS: 1.0000 | ORAL_TABLET | Freq: Two times a day (BID) | ORAL | Status: DC

## 2014-05-03 NOTE — Progress Notes (Signed)
Pre visit review using our clinic review tool, if applicable. No additional management support is needed unless otherwise documented below in the visit note. 

## 2014-05-03 NOTE — Progress Notes (Signed)
Subjective:    Patient ID: Earl Castillo, male    DOB: [birth year], 73 y.o.   MRN: [id number]  Sinusitis This is a new problem. The current episode started 1 to 4 weeks ago (got better then got worse last week). The problem has been gradually worsening since onset. There has been no fever. His pain is at a severity of 8/10. The pain is severe. Associated symptoms include congestion, diaphoresis, ear pain (left ear worse than right), headaches and sinus pressure. Pertinent negatives include no chills, coughing, hoarse voice, neck pain, shortness of breath, sneezing, sore throat or swollen glands. Treatments tried: tylenol and mucinex. The treatment provided no relief.      Review of Systems  Constitutional: Positive for diaphoresis. Negative for fever and chills.  HENT: Positive for congestion, ear pain (left ear worse than right) and sinus pressure. Negative for ear discharge, hoarse voice, postnasal drip, sneezing and sore throat.   Respiratory: Negative for cough and shortness of breath.   Gastrointestinal: Positive for nausea (resolved, seen last week, treated with phenergan and zofran.). Negative for vomiting and diarrhea.  Musculoskeletal: Negative for neck pain.  Neurological: Positive for headaches.  All other systems reviewed and are negative.    Past Medical History  Diagnosis Date  . Hyperlipidemia   . Hypertension   . Chicken pox   . Blood transfusion abn reaction or complication, no procedure mishap   . Coronary artery disease     sees [name]  . Osteoarthritis   . Nephrolithiasis   . Skin exam, screening for cancer     sees [name]   . Hemorrhoid   . GERD (gastroesophageal reflux disease)   . Colon polyps 08/2012    Hyperplastic and adenomatous sessile polyps  . H/O: GI bleed 08/2013  . Anemia   . Carotid artery disease     status post carotid endarterectomy by [name]  . Peripheral arterial disease     status post left to right fem-fem  crossover graft in by [name]  . Angiodysplasia of intestine - small bowel  09/21/2013  . 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 Vitamin B12 deficiency 11/23/2013    History   Social History  . Marital Status: Married    Spouse Name: N/A    Number of Children: 2  . Years of Education: N/A   Occupational History  .  Semi Retired Geophysicist/field seismologist    Social History Main Topics  . Smoking status: Current Every Day Smoker -- 0.25 packs/day for 57 years    Types: Cigarettes  . Smokeless tobacco: Never Used     Comment: smokes 3 a day or less  . Alcohol Use: No  . Drug Use: No  . Sexual Activity: Not on file   Other Topics Concern  . Not on file   Social History Narrative   Daily caffeine     Past Surgical History  Procedure Laterality Date  . Neck surgery  1978  . Bypass rt leg  2000  . Coronary artery bypass graft  2003  . Right carotid endarterectomy    . Basket extraction of ureteral stone  1970's  . Basal cell carcinoma excision  May 2009    per [name] , left nose   . Colonoscopy  08-12-12    per [name], tubular adenomas, repeat in 5 yrs   . Cataract extraction  2014    Bil.  per [name]  . Esophagogastroduodenoscopy N/A 08/17/2013    Procedure: ESOPHAGOGASTRODUODENOSCOPY (EGD);  Surgeon: [name], MD;  Location: [facility] END[facility]COPY;  Service: Endoscopy;  Laterality: N/A;  . Cardiac catheterization  08/18/2003    Recommend CABG  . Coronary artery bypass graft  08/30/2003    x3. LIMA-distal LAD, RIMA-circumflex, SVG-distal RCA  . Carotid endarterectomy Right 03/26/2005    Dr Georgina Quint  . Cardiovascular stress test - lexiscan  08/25/2012    No significant wall motion abnormalities  . Transthoracic echocardiogram  08/02/2013    EF 55-60%, normal echocardiogram  . Renal duplex  02/02/2013    Bilateral renal arteries demonstrate narrowingconsistent with a 60-99% diameter reduction.  . Capsule  endoscopy  10-05-13    per [name], 4 AVM's    Family History  Problem Relation Age of Onset  . Colon cancer Neg Hx   . Heart disease Father     No Known Allergies  Current Outpatient Prescriptions on File Prior to Visit  Medication Sig Dispense Refill  . acetaminophen (TYLENOL) 500 MG tablet Take 1,000 mg by mouth daily as needed for mild pain (leg pain).       Marland Kitchen aspirin EC 81 MG tablet Take 81 mg by mouth daily.      . cyanocobalamin (,VITAMIN B-12,) 1000 MCG/ML injection Inject 1,000 mcg into the muscle every 30 (thirty) days. Last injection 02/25/14      . docusate sodium (COLACE) 100 MG capsule Take 100 mg by mouth daily as needed (constipation).       . ferrous gluconate (FERGON) 324 MG tablet TAKE 1 TABLET BY MOUTH EVERY DAY WITH BREAKFAST  30 tablet  2  . metoprolol (LOPRESSOR) 50 MG tablet Take 25 mg by mouth daily.      Marland Kitchen neomycin-colistin-hydrocortisone-thonzonium (CORTISPORIN TC) 3.01-04-09-0.5 MG/ML otic suspension Place 4 drops into the left ear 4 (four) times daily as needed (itching).       Marland Kitchen NIFEdipine (PROCARDIA XL/ADALAT-CC) 90 MG 24 hr tablet Take 90 mg by mouth daily.      . ondansetron (ZOFRAN-ODT) 8 MG disintegrating tablet Take 1 tablet (8 mg total) by mouth every 8 (eight) hours as needed for nausea or vomiting.  20 tablet  0  . simvastatin (ZOCOR) 20 MG tablet Take 20 mg by mouth daily after supper.       No current facility-administered medications on file prior to visit.    EXAM: BP 126/70  Pulse 61  Temp(Src) 98.2 F (36.8 C) (Oral)  Resp 18  Wt 142 lb (64.411 kg)  SpO2 97%     Objective:   Physical Exam  Nursing note and vitals reviewed. Constitutional: He is oriented to person, place, and time. He appears well-developed and well-nourished. No distress.  HENT:  Head: Normocephalic and atraumatic.  Right Ear: External ear normal.  Left Ear: External ear normal.  Nose: Nose normal.  Mouth/Throat: No oropharyngeal exudate.  Oropharynx is  slightly erythematous, no exudate. Subcentimeter waxy nodule noted on left tonsil. Bilateral TMs slightly bulging and otherwise normal. Focal sinus tenderness worse left than right.   Eyes: Conjunctivae and EOM are normal. Pupils are equal, round, and reactive to light.  Neck: Normal range of motion. Neck supple. No JVD present.  Cardiovascular: Normal rate, regular rhythm and intact distal pulses.   Pulmonary/Chest: Effort normal and breath sounds normal. No stridor. No respiratory distress. He has no wheezes. He has no rales. He exhibits no tenderness.  Musculoskeletal: Normal range of motion.  Lymphadenopathy:    He has no cervical adenopathy.  Neurological: He is alert and oriented to person, place, and time.  Skin: Skin is warm and dry. No rash noted. He is not diaphoretic. No erythema. No pallor.  Psychiatric: He has a normal mood and affect. His behavior is normal. Judgment and thought content normal.     Lab Results  Component Value Date   WBC 7.5 04/26/2014   HGB 15.0 04/26/2014   HCT 43.1 04/26/2014   PLT 162 04/26/2014   GLUCOSE 94 04/26/2014   CHOL 120 12/03/2013   TRIG 66 12/03/2013   HDL 51 12/03/2013   LDLCALC 56 12/03/2013   ALT 8 04/26/2014   AST 16 04/26/2014   NA 143 04/26/2014   K 3.9 04/26/2014   CL 108 04/26/2014   CREATININE 0.94 04/26/2014   BUN 17 04/26/2014   CO2 28 04/26/2014   TSH 1.152 04/26/2014   PSA 0.62 03/04/2014   INR 1.05 04/26/2014        Assessment & Plan:  Kaien was seen today for sinusitis.  Diagnoses and associated orders for this visit:  Acute sinusitis, recurrence not specified, unspecified location Comments: Left worse than right, ouble sickening. Will treat with Augmentin. Also OTC medications. - amoxicillin-clavulanate (AUGMENTIN) 875-125 MG per tablet; Take 1 tablet by mouth 2 (two) times daily.  Lesion of tonsil Comments: Waxy appearance. Asymptomatic, not interfering with breathing/swallowing. No drooling. ENT ref. - Ambulatory  referral to ENT    Pt is a less than half ppd smoker with chronic sinus/allergy issues. Smoking cessation discussed, pt was offer Chantix, however pt desired to forgo for now and try to quit on his own. Pt will ask PCP about this in the future if he desires.  Return precautions provided, and patient handout on sinusitis.  Plan to follow up in about 2 weeks with PCP to reassess, or for worsening or persistent symptoms despite treatment.  Patient Instructions  Augmentin twice daily for 10 days for sinus infection.   Plain Over the Counter Mucinex (NOT Mucinex D) for thick secretions  Force NON dairy fluids, drinking plenty of water is best.    Over the Counter Flonase OR Nasacort AQ 1 spray in each nostril twice a day as needed. Use the "crossover" technique into opposite nostril spraying toward opposite ear @ 45 degree angle, not straight up into nostril.   Plain Over the Counter Loratidine 10 mg nightly to decrease allergic symptoms.  Saline Irrigation and Saline Sprays can also help reduce symptoms.  Quit smoking. Medication is available if you desire in the future, you can ask your PCP about this at follow up.  You will be called to schedule and ENT appointment to evaluate your tonsil nodule.  If emergency symptoms discussed during visit developed, seek medical attention immediately.  Followup in about 2 weeks with your PCP to reassess, or for worsening or persistent symptoms despite treatment.

## 2014-05-03 NOTE — Patient Instructions (Signed)
Augmentin twice daily for 10 days for sinus infection.   Plain Over the Counter Mucinex (NOT Mucinex D) for thick secretions  Force NON dairy fluids, drinking plenty of water is best.    Over the Counter Flonase OR Nasacort AQ 1 spray in each nostril twice a day as needed. Use the "crossover" technique into opposite nostril spraying toward opposite ear @ 45 degree angle, not straight up into nostril.   Plain Over the Counter Loratidine 10 mg nightly to decrease allergic symptoms.  Saline Irrigation and Saline Sprays can also help reduce symptoms.  Quit smoking. Medication is available if you desire in the future, you can ask your PCP about this at follow up.  You will be called to schedule and ENT appointment to evaluate your tonsil nodule.  If emergency symptoms discussed during visit developed, seek medical attention immediately.  Followup in about 2 weeks with your PCP to reassess, or for worsening or persistent symptoms despite treatment.     Sinusitis Sinusitis is redness, soreness, and puffiness (inflammation) of the air pockets in the bones of your face (sinuses). The redness, soreness, and puffiness can cause air and mucus to get trapped in your sinuses. This can allow germs to grow and cause an infection.  HOME CARE   Drink enough fluids to keep your pee (urine) clear or pale yellow.  Use a humidifier in your home.  Run a hot shower to create steam in the bathroom. Sit in the bathroom with the door closed. Breathe in the steam 3-4 times a day.  Put a warm, moist washcloth on your face 3-4 times a day, or as told by your doctor.  Use salt water sprays (saline sprays) to wet the thick fluid in your nose. This can help the sinuses drain.  Only take medicine as told by your doctor. GET HELP RIGHT AWAY IF:   Your pain gets worse.  You have very bad headaches.  You are sick to your stomach (nauseous).  You throw up (vomit).  You are very sleepy (drowsy) all the  time.  Your face is puffy (swollen).  Your vision changes.  You have a stiff neck.  You have trouble breathing. MAKE SURE YOU:   Understand these instructions.  Will watch your condition.  Will get help right away if you are not doing well or get worse. Document Released: 04/08/2008 Document Revised: 07/15/2012 Document Reviewed: 05/26/2012 [facility] Patient Information 2015 [place], [place]. This information is not intended to replace advice given to you by your health care provider. Make sure you discuss any questions you have with your health care provider.

## 2014-05-05 DIAGNOSIS — I739 Peripheral vascular disease, unspecified: Secondary | ICD-10-CM | POA: Diagnosis not present

## 2014-05-05 DIAGNOSIS — F17200 Nicotine dependence, unspecified, uncomplicated: Secondary | ICD-10-CM | POA: Diagnosis not present

## 2014-05-05 DIAGNOSIS — D104 Benign neoplasm of tonsil: Secondary | ICD-10-CM | POA: Diagnosis not present

## 2014-05-16 ENCOUNTER — Encounter (HOSPITAL_COMMUNITY): Payer: Self-pay | Admitting: Pharmacy Technician

## 2014-05-16 ENCOUNTER — Encounter: Payer: Self-pay | Admitting: Cardiovascular Disease

## 2014-05-16 ENCOUNTER — Telehealth: Payer: Self-pay | Admitting: Cardiovascular Disease

## 2014-05-16 NOTE — Telephone Encounter (Signed)
Spoke with Randall Hiss regarding case that is scheduled on Monday 05/23/14 @ 11 AM.

## 2014-05-17 ENCOUNTER — Encounter: Payer: Self-pay | Admitting: Cardiovascular Disease

## 2014-05-17 ENCOUNTER — Ambulatory Visit (INDEPENDENT_AMBULATORY_CARE_PROVIDER_SITE_OTHER): Payer: Medicare Other | Admitting: Cardiovascular Disease

## 2014-05-17 ENCOUNTER — Ambulatory Visit: Payer: Medicare Other | Admitting: Family Medicine

## 2014-05-17 VITALS — BP 140/60 | HR 52 | Ht 66.5 in | Wt 142.0 lb

## 2014-05-17 DIAGNOSIS — D689 Coagulation defect, unspecified: Secondary | ICD-10-CM

## 2014-05-17 DIAGNOSIS — Z79899 Other long term (current) drug therapy: Secondary | ICD-10-CM

## 2014-05-17 DIAGNOSIS — I2510 Atherosclerotic heart disease of native coronary artery without angina pectoris: Secondary | ICD-10-CM | POA: Diagnosis not present

## 2014-05-17 DIAGNOSIS — I739 Peripheral vascular disease, unspecified: Secondary | ICD-10-CM

## 2014-05-17 LAB — CBC
HCT: 40.2 % (ref 39.0–52.0)
Hemoglobin: 14.3 g/dL (ref 13.0–17.0)
MCH: 32.9 pg (ref 26.0–34.0)
MCHC: 35.6 g/dL (ref 30.0–36.0)
MCV: 92.6 fL (ref 78.0–100.0)
Platelets: 205 10*3/uL (ref 150–400)
RBC: 4.34 MIL/uL (ref 4.22–5.81)
RDW: 14.2 % (ref 11.5–15.5)
WBC: 9.9 10*3/uL (ref 4.0–10.5)

## 2014-05-17 LAB — BASIC METABOLIC PANEL
BUN: 19 mg/dL (ref 6–23)
CHLORIDE: 104 meq/L (ref 96–112)
CO2: 28 mEq/L (ref 19–32)
Calcium: 9.4 mg/dL (ref 8.4–10.5)
Creat: 1.01 mg/dL (ref 0.50–1.35)
GLUCOSE: 83 mg/dL (ref 70–99)
POTASSIUM: 4.6 meq/L (ref 3.5–5.3)
Sodium: 139 mEq/L (ref 135–145)

## 2014-05-17 NOTE — Progress Notes (Signed)
Earl Castillo is here today for followup. He was initially scheduled for angiography and diamondback orbital or facial atherectomy of his mid left SFA on 05/02/14 but had to cancel because of an upper respiratory tract infection. This has been rescheduled for 05/23/14. There have been no intercurrent symptoms or events.  [name], M.D., [place], [facility], Laverta [place] Herrick [address] [place], [place]  [address]  [phone] 05/17/2014 11:37 AM

## 2014-05-17 NOTE — Assessment & Plan Note (Addendum)
Patient underwent angiography 03/17/14 revealing a patent left to right fem-fem bypass graft with a calcified high-grade lesion in the mid left SFA and three-vessel runoff. He does have lifestyle limitingclaudication. He was scheduled for intervention on 629 but had to cancel because of an upper respiratory tract infection. This has been rescheduled for 05/23/14.

## 2014-05-17 NOTE — Patient Instructions (Signed)
Proceed with the pv angiogram scheduled for 7/20

## 2014-05-18 LAB — APTT: aPTT: 35 seconds (ref 24–37)

## 2014-05-18 LAB — PROTIME-INR
INR: 1.02 (ref <1.50)
Prothrombin Time: 13.4 seconds (ref 11.6–15.2)

## 2014-05-20 ENCOUNTER — Ambulatory Visit (INDEPENDENT_AMBULATORY_CARE_PROVIDER_SITE_OTHER): Payer: Medicare Other | Admitting: Family Medicine

## 2014-05-20 ENCOUNTER — Encounter: Payer: Self-pay | Admitting: Family Medicine

## 2014-05-20 VITALS — BP 159/71 | HR 66 | Temp 98.4°F | Ht 66.5 in | Wt 142.0 lb

## 2014-05-20 DIAGNOSIS — J0100 Acute maxillary sinusitis, unspecified: Secondary | ICD-10-CM | POA: Diagnosis not present

## 2014-05-20 DIAGNOSIS — I2510 Atherosclerotic heart disease of native coronary artery without angina pectoris: Secondary | ICD-10-CM | POA: Diagnosis not present

## 2014-05-20 NOTE — Progress Notes (Signed)
   Subjective:    Patient ID: Earl Castillo, male    DOB: 08/30/1941, 73 y.o.   MRN: [id number]  HPI Here to follow up after being treated for a sinusitis 2 weeks ago. He took a course of augmentin and he feels back to normal. He is set for an atherectomy next week.    Review of Systems  Constitutional: Negative.   HENT: Negative.   Eyes: Negative.   Respiratory: Negative.   Cardiovascular: Negative.        Objective:   Physical Exam  Constitutional: He appears well-developed and well-nourished.  HENT:  Right Ear: External ear normal.  Left Ear: External ear normal.  Nose: Nose normal.  Mouth/Throat: Oropharynx is clear and moist.  Eyes: Conjunctivae are normal.  Cardiovascular: Normal rate, regular rhythm, normal heart sounds and intact distal pulses.   Pulmonary/Chest: Effort normal and breath sounds normal.  Lymphadenopathy:    He has no cervical adenopathy.          Assessment & Plan:  He is doing well. Recheck prn

## 2014-05-20 NOTE — Progress Notes (Signed)
Pre visit review using our clinic review tool, if applicable. No additional management support is needed unless otherwise documented below in the visit note. 

## 2014-05-23 ENCOUNTER — Ambulatory Visit (HOSPITAL_COMMUNITY)
Admission: RE | Admit: 2014-05-23 | Discharge: 2014-05-24 | Disposition: A | Discharge status: Home / Self Care | Payer: Medicare Other | Source: Ambulatory Visit | Attending: Cardiovascular Disease | Admitting: Cardiovascular Disease

## 2014-05-23 ENCOUNTER — Encounter (HOSPITAL_COMMUNITY)
Admission: RE | Disposition: A | Discharge status: Home / Self Care | Payer: Self-pay | Source: Ambulatory Visit | Attending: Cardiovascular Disease

## 2014-05-23 ENCOUNTER — Other Ambulatory Visit: Payer: Self-pay | Admitting: Physician Assistant

## 2014-05-23 ENCOUNTER — Encounter (HOSPITAL_COMMUNITY): Payer: Self-pay | Admitting: General Practice

## 2014-05-23 DIAGNOSIS — Z7902 Long term (current) use of antithrombotics/antiplatelets: Secondary | ICD-10-CM | POA: Insufficient documentation

## 2014-05-23 DIAGNOSIS — E785 Hyperlipidemia, unspecified: Secondary | ICD-10-CM | POA: Diagnosis not present

## 2014-05-23 DIAGNOSIS — Z7982 Long term (current) use of aspirin: Secondary | ICD-10-CM | POA: Insufficient documentation

## 2014-05-23 DIAGNOSIS — F17200 Nicotine dependence, unspecified, uncomplicated: Secondary | ICD-10-CM | POA: Diagnosis present

## 2014-05-23 DIAGNOSIS — I10 Essential (primary) hypertension: Secondary | ICD-10-CM | POA: Diagnosis not present

## 2014-05-23 DIAGNOSIS — K219 Gastro-esophageal reflux disease without esophagitis: Secondary | ICD-10-CM | POA: Insufficient documentation

## 2014-05-23 DIAGNOSIS — D649 Anemia, unspecified: Secondary | ICD-10-CM | POA: Diagnosis not present

## 2014-05-23 DIAGNOSIS — I2510 Atherosclerotic heart disease of native coronary artery without angina pectoris: Secondary | ICD-10-CM | POA: Diagnosis not present

## 2014-05-23 DIAGNOSIS — I739 Peripheral vascular disease, unspecified: Secondary | ICD-10-CM

## 2014-05-23 DIAGNOSIS — Z01812 Encounter for preprocedural laboratory examination: Secondary | ICD-10-CM

## 2014-05-23 DIAGNOSIS — I70219 Atherosclerosis of native arteries of extremities with intermittent claudication, unspecified extremity: Secondary | ICD-10-CM | POA: Diagnosis not present

## 2014-05-23 DIAGNOSIS — D500 Iron deficiency anemia secondary to blood loss (chronic): Secondary | ICD-10-CM | POA: Diagnosis present

## 2014-05-23 DIAGNOSIS — Z951 Presence of aortocoronary bypass graft: Secondary | ICD-10-CM | POA: Insufficient documentation

## 2014-05-23 HISTORY — DX: Gastrointestinal hemorrhage, unspecified: K92.2

## 2014-05-23 HISTORY — DX: Personal history of other medical treatment: Z92.89

## 2014-05-23 HISTORY — DX: Headache: R51

## 2014-05-23 HISTORY — DX: Headache, unspecified: R51.9

## 2014-05-23 HISTORY — PX: FEMORAL ARTERY STENT: SHX1583

## 2014-05-23 LAB — POCT ACTIVATED CLOTTING TIME
ACTIVATED CLOTTING TIME: 146 s
Activated Clotting Time: 197 seconds
Activated Clotting Time: 247 seconds

## 2014-05-23 SURGERY — ANGIOGRAM EXTREMITY LEFT

## 2014-05-23 MED ORDER — NITROGLYCERIN IN D5W 200-5 MCG/ML-% IV SOLN
10.0000 ug/min | INTRAVENOUS | Status: DC
  Administered 2014-05-23 – 2014-05-24 (×2): 10 ug/min via INTRAVENOUS

## 2014-05-23 MED ORDER — NITROGLYCERIN IN D5W 200-5 MCG/ML-% IV SOLN
INTRAVENOUS | Status: AC
  Filled 2014-05-23: qty 250

## 2014-05-23 MED ORDER — LIDOCAINE HCL (PF) 1 % IJ SOLN
INTRAMUSCULAR | Status: AC
  Filled 2014-05-23: qty 30

## 2014-05-23 MED ORDER — ASPIRIN 81 MG PO CHEW
81.0000 mg | CHEWABLE_TABLET | ORAL | Status: AC
  Administered 2014-05-23: 81 mg via ORAL

## 2014-05-23 MED ORDER — SODIUM CHLORIDE 0.9 % IV SOLN
INTRAVENOUS | Status: DC
  Administered 2014-05-23: 10:00:00 via INTRAVENOUS

## 2014-05-23 MED ORDER — CLOPIDOGREL BISULFATE 75 MG PO TABS
300.0000 mg | ORAL_TABLET | Freq: Once | ORAL | Status: AC
  Administered 2014-05-23: 16:00:00 300 mg via ORAL
  Filled 2014-05-23: qty 4

## 2014-05-23 MED ORDER — HEPARIN (PORCINE) IN NACL 2-0.9 UNIT/ML-% IJ SOLN
INTRAMUSCULAR | Status: AC
  Filled 2014-05-23: qty 500

## 2014-05-23 MED ORDER — ACETAMINOPHEN 500 MG PO TABS: 1000.0000 mg | ORAL_TABLET | Freq: Every day | ORAL | Status: DC | PRN

## 2014-05-23 MED ORDER — NIFEDIPINE ER OSMOTIC RELEASE 90 MG PO TB24
90.0000 mg | ORAL_TABLET | Freq: Every day | ORAL | Status: DC
  Administered 2014-05-24: 06:00:00 90 mg via ORAL
  Filled 2014-05-23 (×2): qty 1

## 2014-05-23 MED ORDER — MIDAZOLAM HCL 2 MG/2ML IJ SOLN
INTRAMUSCULAR | Status: AC
  Filled 2014-05-23: qty 2

## 2014-05-23 MED ORDER — CYANOCOBALAMIN 1000 MCG/ML IJ SOLN
1000.0000 ug | INTRAMUSCULAR | Status: DC
  Filled 2014-05-23: qty 1

## 2014-05-23 MED ORDER — ACETAMINOPHEN 325 MG PO TABS
650.0000 mg | ORAL_TABLET | ORAL | Status: DC | PRN
  Administered 2014-05-23: 23:00:00 650 mg via ORAL
  Filled 2014-05-23: qty 2

## 2014-05-23 MED ORDER — SODIUM CHLORIDE 0.9 % IV SOLN
INTRAVENOUS | Status: AC
  Administered 2014-05-23: 14:00:00 via INTRAVENOUS

## 2014-05-23 MED ORDER — GUAIFENESIN-DM 100-10 MG/5ML PO SYRP: 15.0000 mL | ORAL_SOLUTION | ORAL | Status: DC | PRN

## 2014-05-23 MED ORDER — CLOPIDOGREL BISULFATE 75 MG PO TABS
75.0000 mg | ORAL_TABLET | Freq: Every day | ORAL | Status: DC
  Administered 2014-05-24: 10:00:00 75 mg via ORAL

## 2014-05-23 MED ORDER — SODIUM CHLORIDE 0.9 % IJ SOLN: 3.0000 mL | INTRAMUSCULAR | Status: DC | PRN

## 2014-05-23 MED ORDER — NEOMYCIN-COLIST-HC-THONZONIUM 3.3-3-10-0.5 MG/ML OT SUSP
4.0000 [drp] | Freq: Four times a day (QID) | OTIC | Status: DC | PRN
  Filled 2014-05-23: qty 5

## 2014-05-23 MED ORDER — CLOPIDOGREL BISULFATE 75 MG PO TABS: 300.0000 mg | ORAL_TABLET | Freq: Every day | ORAL | Status: DC

## 2014-05-23 MED ORDER — FERROUS GLUCONATE 324 (38 FE) MG PO TABS
324.0000 mg | ORAL_TABLET | Freq: Every day | ORAL | Status: DC
  Filled 2014-05-23 (×2): qty 1

## 2014-05-23 MED ORDER — MORPHINE SULFATE 2 MG/ML IJ SOLN
2.0000 mg | INTRAMUSCULAR | Status: DC | PRN
  Administered 2014-05-23: 2 mg via INTRAVENOUS
  Filled 2014-05-23: qty 1

## 2014-05-23 MED ORDER — ASPIRIN EC 81 MG PO TBEC
81.0000 mg | DELAYED_RELEASE_TABLET | Freq: Every day | ORAL | Status: DC
  Administered 2014-05-24: 10:00:00 81 mg via ORAL
  Filled 2014-05-23 (×2): qty 1

## 2014-05-23 MED ORDER — FENTANYL CITRATE 0.05 MG/ML IJ SOLN
INTRAMUSCULAR | Status: AC
  Filled 2014-05-23: qty 2

## 2014-05-23 MED ORDER — HYDRALAZINE HCL 20 MG/ML IJ SOLN
10.0000 mg | INTRAMUSCULAR | Status: DC | PRN
  Administered 2014-05-23: 16:00:00 10 mg via INTRAVENOUS
  Administered 2014-05-23: 15:00:00 via INTRAVENOUS
  Filled 2014-05-23 (×2): qty 1

## 2014-05-23 MED ORDER — ASPIRIN 81 MG PO CHEW
CHEWABLE_TABLET | ORAL | Status: AC
  Administered 2014-05-23: 81 mg via ORAL
  Filled 2014-05-23: qty 1

## 2014-05-23 MED ORDER — SIMVASTATIN 20 MG PO TABS
20.0000 mg | ORAL_TABLET | Freq: Every day | ORAL | Status: DC
  Administered 2014-05-23: 18:00:00 20 mg via ORAL
  Filled 2014-05-23 (×2): qty 1

## 2014-05-23 MED ORDER — METOPROLOL TARTRATE 25 MG PO TABS
25.0000 mg | ORAL_TABLET | Freq: Every day | ORAL | Status: DC
  Administered 2014-05-24: 25 mg via ORAL
  Filled 2014-05-23 (×2): qty 1

## 2014-05-23 MED ORDER — ONDANSETRON HCL 4 MG/2ML IJ SOLN: 4.0000 mg | Freq: Four times a day (QID) | INTRAMUSCULAR | Status: DC | PRN

## 2014-05-23 NOTE — Progress Notes (Signed)
Utilization Review Completed.Earl Castillo T7/20/2015  

## 2014-05-23 NOTE — Progress Notes (Addendum)
Site area: Mid fem-fem graft via sheath mid lower abdomen Site Prior to Removal:  Level 0; small area of bruising @ insertion site no palpable hematoma Light pressure Applied For 25  MINUTES   Manual:   yes Patient Status During Pull: Stable, coughing @ intervals. Patient emptied bladder before and after sheath pull. Post Pull Groin Site:  Level 0; area of small bruise @ insertion site unchanged. Post Pull Instructions Given:  Yes Post Pull Pulses Present: Bilateral DP pulses remained palpable during sheath pull and post sheath pull. Dressing Applied:  Tegaderm Bedrest begins 17:25:00 Comments Patient remained stable during sheath pull. Site assessed with Chales Salmon, RN prior to placing dressing.  Sheath was pulled and the above documentation done by Hoyle Sauer A. Mikeal Hawthorne, RN

## 2014-05-23 NOTE — Progress Notes (Signed)
Patient arrived on unit from cath lab holding at 1430.  Catheter insertion site at suprapubic area, level 0.  Sheath removed by Beatrix Fetters, RN at 1700, held for 20 minutes until 1720.  Patient stable throughout hold.  SBP at time of pull was 186.  Weaning nitro drip off at this time; pressure remains high;  will continue to monitor.  Site remains at level 0.  Post pull activity restrictions given to patient and wife; compliant.  Pedal pulses palpable bilaterally.

## 2014-05-23 NOTE — CV Procedure (Signed)
Earl Castillo is a 73 y.o. male    [id number] LOCATION:  FACILITY: [facility]  PHYSICIAN: Quay Burow, M.D. December 14, 1940   DATE OF PROCEDURE:  05/23/2014  DATE OF DISCHARGE:     PV Angiogram/Intervention    History obtained from chart review.The patient is a 73 year old white male previously followed by Earl Castillo and now followed by Earl Castillo. His past medical history is significant for CAD, status post coronary artery bypass grafting in 2004 by [name], with a LIMA to his LAD, RIMA to the circumflex marginal and SVG to the distal RCA. He had a Myoview stress test performed 08/23/2012 which was low risk and nonischemic. He also has hypertension and hyperlipidemia. He has a 60+ year history of tobacco use and continues to smoke daily. He has significant peripheral vascular disease. He is status post right carotid endarterectomy performed by [name] in 2006. We have been following his carotid disease yearly by duplex ultrasound. He has also had a left to right fem-fem crossover graft by [name] in May of 2000. He was recently seen by Earl Castillo and complained of worsening claudication. Recent lower extremity arterial Doppler studies demonstrated progression of disease in his left common and external iliac arteries. In addition, it was suggested that he had moderately severe distal left SFA disease. Subsequently, on 03/17/2014 he underwent a diagnostic PV angiogram performed by Earl Castillo. His fem-fem crossover graft was widely patent. His left iliac also appeared widely patent, suggesting falsely elevated velocities by duplex. He did have a high grade focal calcified mid to distal left SFA stenosis corresponding to an area of abnormality and high velocity in the duplex ultrasound with three-vessel runoff. Earl Castillo recommended intervention with diamondback orbital rotational atherectomy, plus or minus PTA and stenting, at a later date. He was discharged home and he now presents back to  clinic for post hospital followup.  He states that he has been doing fairly well since discharge but he continues to note significant lifestyle limiting left lower extremity claudication. He denies any anterior groin, flank or low back pain, where the cath was performed. He also denies any chest pain or shortness of breath. He also denies any left extremity ulcers and sores.    PROCEDURE DESCRIPTION:   The patient was brought to the second floor [facility] cath lab in the postabsorptive state. He was premedicated with Valium 5 mg by mouth, IV Versed and fentanyl. His fem-fem crossover graftwas prepped and shaved in usual sterile fashion. Xylocaine 1% was used for local anesthesia. A 6 French sheath was inserted into the fem-fem crossover graft using standard Seldinger technique.    HEMODYNAMICS:    AO SYSTOLIC/AO DIASTOLIC: 856/31   Angiographic Data:   The patient received a dose units of heparin intravenously and ACT of 247. A total of 173 cc of contrast was administered. I was able to get a fiber wire down the SFA into the above-knee popliteal artery. I then used a use date 2 mm solid diamondback burr on the mid left SFA lesion as well as the proximal left SFA lesion add up to 120,000 rpm's. I performed PTA of the mid left SFA lesion with a 5 mm x 4 cm LUTONIX  drug-eluting balloon at nominal pressures for 2 minutes. I then withdrew the balloon back to the proximal SFA lesion and performed a PTA of that lesion. I deployed a 6 mm x 4 cm Abbott  Absolut Pro Nitinol self-expanding stent in the  origin of the left SFA and postdilated with a 6 mm x 2 cm balloon resulting in reduction of a 70% calcified proximal eccentric left SFA stenosis to 0% residual.  IMPRESSION:successful diamondback orbital rotational atherectomy, drug-eluting balloon angioplasty mid left SFA and stenting proximal left SFA with access using the fem-fem crossover graft. The patient will need to be on dual antibiotic  therapy. The sheath will be removed once the ACT has fallen to less than 170  and gentle pressure will be applied. He'll be hydrated overnight, discharged in the morning I will get followup lower extremity arterial Doppler studies in our [facility] office after which I will see him back.    [name] MD, [facility] 05/23/2014 2:08 PM

## 2014-05-24 ENCOUNTER — Other Ambulatory Visit: Payer: Self-pay | Admitting: Cardiology

## 2014-05-24 ENCOUNTER — Encounter (HOSPITAL_COMMUNITY): Payer: Self-pay | Admitting: Cardiology

## 2014-05-24 DIAGNOSIS — I739 Peripheral vascular disease, unspecified: Secondary | ICD-10-CM | POA: Diagnosis not present

## 2014-05-24 DIAGNOSIS — I70219 Atherosclerosis of native arteries of extremities with intermittent claudication, unspecified extremity: Secondary | ICD-10-CM | POA: Diagnosis not present

## 2014-05-24 DIAGNOSIS — F17200 Nicotine dependence, unspecified, uncomplicated: Secondary | ICD-10-CM | POA: Diagnosis present

## 2014-05-24 LAB — BASIC METABOLIC PANEL
Anion gap: 10 (ref 5–15)
BUN: 15 mg/dL (ref 6–23)
CO2: 24 mEq/L (ref 19–32)
CREATININE: 0.92 mg/dL (ref 0.50–1.35)
Calcium: 8.2 mg/dL — ABNORMAL LOW (ref 8.4–10.5)
Chloride: 106 mEq/L (ref 96–112)
GFR, EST NON AFRICAN AMERICAN: 82 mL/min — AB (ref >90)
GLUCOSE: 89 mg/dL (ref 70–99)
Potassium: 3.4 mEq/L — ABNORMAL LOW (ref 3.7–5.3)
Sodium: 140 mEq/L (ref 137–147)

## 2014-05-24 LAB — CBC
HEMATOCRIT: 37.3 % — AB (ref 39.0–52.0)
HEMOGLOBIN: 12.4 g/dL — AB (ref 13.0–17.0)
MCH: 32.9 pg (ref 26.0–34.0)
MCHC: 33.2 g/dL (ref 30.0–36.0)
MCV: 98.9 fL (ref 78.0–100.0)
Platelets: 127 10*3/uL — ABNORMAL LOW (ref 150–400)
RBC: 3.77 MIL/uL — ABNORMAL LOW (ref 4.22–5.81)
RDW: 13.9 % (ref 11.5–15.5)
WBC: 8.4 10*3/uL (ref 4.0–10.5)

## 2014-05-24 MED ORDER — LOSARTAN POTASSIUM 25 MG PO TABS: 25.0000 mg | ORAL_TABLET | Freq: Every day | ORAL | Status: DC

## 2014-05-24 MED ORDER — LOSARTAN POTASSIUM 25 MG PO TABS
25.0000 mg | ORAL_TABLET | Freq: Every day | ORAL | Status: DC
  Filled 2014-05-24: qty 1

## 2014-05-24 MED ORDER — POTASSIUM CHLORIDE CRYS ER 20 MEQ PO TBCR: 20.0000 meq | EXTENDED_RELEASE_TABLET | Freq: Once | ORAL | Status: DC

## 2014-05-24 MED ORDER — CLOPIDOGREL BISULFATE 75 MG PO TABS: 75.0000 mg | ORAL_TABLET | Freq: Every day | ORAL | Status: DC

## 2014-05-24 NOTE — Progress Notes (Signed)
    Subjective:  No complaints   Objective:  Vital Signs in the last 24 hours: Temp:  [97.5 F (36.4 C)-98.7 F (37.1 C)] 98.7 F (37.1 C) (07/21 0523) Pulse Rate:  [44-69] 59 (07/21 0553) Resp:  [16-20] 20 (07/21 0523) BP: (162-199)/(39-117) 188/59 mmHg (07/21 0523) SpO2:  [94 %-99 %] 96 % (07/21 0523) Weight:  [140 lb 10.5 oz (63.8 kg)-144 lb (65.318 kg)] 140 lb 10.5 oz (63.8 kg) (07/21 0057)  Intake/Output from previous day:  Intake/Output Summary (Last 24 hours) at 05/24/14 2376 Last data filed at 05/24/14 0430  Gross per 24 hour  Intake   1451 ml  Output   1475 ml  Net    -24 ml    Physical Exam: General appearance: alert, cooperative and no distress Lungs: clear to auscultation bilaterally Heart: regular rate and rhythm Abdomen: supra pubic site without hematoma   Rate: 60  Rhythm: normal sinus rhythm  Lab Results:  Recent Labs  05/24/14 0343  WBC 8.4  HGB 12.4*  PLT 127*    Recent Labs  05/24/14 0343  NA 140  K 3.4*  CL 106  CO2 24  GLUCOSE 89  BUN 15  CREATININE 0.92   No results found for this basename: TROPONINI, CK, MB,  in the last 72 hours No results found for this basename: INR,  in the last 72 hours  Imaging: Imaging results have been reviewed   Assessment/Plan:   Principal Problem:   Claudication Active Problems:   PVD- Lt SFA PTA/stent 05/23/14   HYPERTENSION   CAD-CABG '04, low risk Myoview 2013   HYPERLIPIDEMIA   Current smoker    PLAN: replace K+, consider adding an ACE or ARB for HTN. Follow up dopplers in 1-2 wks, then Dr [name].   Kerin Ransom PA-C Beeper [phone] 05/24/2014, 6:21 AM  Patient seen, examined. Available data reviewed. Agree with findings, assessment, and plan as outlined by Kerin Ransom, PA-C. Exam reveals a stable access site over the fem-fem bypass graft. Pt with 3+ DP/PT pulses bilaterally. Continue with ASA/plavix. Follow-up dopplers and office visit with Dr [name] as planned. Stable for  discharge.  [name], M.D. 05/24/2014 8:03 AM

## 2014-05-24 NOTE — Progress Notes (Signed)
Tylenol effective for headache while weaning NTG gtt off.  Sitting up in bed drinking coffee.  BP up to 188/59.  Tele SB 59, denies complaints.  Suprapubic area level 0, drsg d&i.  Urine dark amber.

## 2014-05-24 NOTE — Discharge Summary (Signed)
Patient ID: Earl Castillo,  MRN: [id number], DOB/AGE: 05/10/1941 73 y.o.  Admit date: 05/23/2014 Discharge date: 05/24/2014  Primary Care Provider: Dr Chauncey Cruel. Sharlene Motts Primary Cardiologist: Dr [name]  Discharge Diagnoses Principal Problem:   Claudication Active Problems:   PVD- Lt SFA PTA/stent 05/23/14   HYPERTENSION   CAD-CABG '04, low risk Myoview 2013   HYPERLIPIDEMIA   Current smoker    Procedures:  Lt SFA PTA/ stent 05/23/14   Hospital Course:  73 year old previously followed by [name] and now followed by [name]. His past medical history is significant for CAD, status post CABG in 2004 by [name], with a LIMA to his LAD, RIMA to the circumflex marginal and SVG to the distal RCA. He had a Myoview stress test performed 08/23/2012 which was low risk and nonischemic. He also has hypertension and hyperlipidemia. He has a 60+ year history of tobacco use and continues to smoke daily. He has significant peripheral vascular disease. He is status post right carotid endarterectomy performed by [name] in 2006. We have been following his carotid disease yearly by duplex ultrasound. He has also had a left to right fem-fem crossover graft by [name] in May of 2000. He was seen by [name] this spring and complained of worsening claudication. Lower extremity arterial Doppler studies demonstrated progression of disease in his left common and external iliac arteries. In addition, it was suggested that he had moderately severe distal left SFA disease. Subsequently, on 03/17/2014 he underwent a diagnostic PV angiogram performed by [name]. His fem-fem crossover graft was widely patent. His left iliac also appeared widely patent, suggesting falsely elevated velocities by duplex. He did have a high grade focal calcified mid to distal left SFA stenosis corresponding to an area of abnormality and high velocity in the duplex ultrasound with three-vessel runoff. [name] recommended  intervention with diamondback orbital rotational atherectomy, plus or minus PTA and stenting. He was admitted 05/23/14 and had successful diamondback orbital rotational atherectomy, drug-eluting balloon angioplasty mid left SFA and stenting proximal left SFA with access using the fem-fem crossover graft. The pt tolerated the procedure well. We feel he can be discharged 05/24/14. His B/P was elevated during this hospitalization and we added low dose ARB at discharge. He should have OP dopplers and a follow up with Dr [name] or an APP in a few weeks. At that time we should check a BMP since the ARB is new.                    Discharge Vitals:  Blood pressure 188/59, pulse 59, temperature 98.7 F (37.1 C), temperature source Oral, resp. rate 20, height 5' 6.5" (1.689 m), weight 140 lb 10.5 oz (63.8 kg), SpO2 96.00%.    Labs: Results for orders placed during the hospital encounter of 05/23/14 (from the past 24 hour(s))  POCT ACTIVATED CLOTTING TIME     Status: None   Collection Time    05/23/14  1:10 PM      Result Value Ref Range   Activated Clotting Time 197    POCT ACTIVATED CLOTTING TIME     Status: None   Collection Time    05/23/14  1:32 PM      Result Value Ref Range   Activated Clotting Time 247    POCT ACTIVATED CLOTTING TIME     Status: None   Collection Time    05/23/14  4:24 PM  Result Value Ref Range   Activated Clotting Time 146    CBC     Status: Abnormal   Collection Time    05/24/14  3:43 AM      Result Value Ref Range   WBC 8.4  4.0 - 10.5 K/uL   RBC 3.77 (*) 4.22 - 5.81 MIL/uL   Hemoglobin 12.4 (*) 13.0 - 17.0 g/dL   HCT 37.3 (*) 39.0 - 52.0 %   MCV 98.9  78.0 - 100.0 fL   MCH 32.9  26.0 - 34.0 pg   MCHC 33.2  30.0 - 36.0 g/dL   RDW 13.9  11.5 - 15.5 %   Platelets 127 (*) 150 - 400 K/uL  BASIC METABOLIC PANEL     Status: Abnormal   Collection Time    05/24/14  3:43 AM      Result Value Ref Range   Sodium 140  137 - 147 mEq/L   Potassium 3.4 (*) 3.7 - 5.3  mEq/L   Chloride 106  96 - 112 mEq/L   CO2 24  19 - 32 mEq/L   Glucose, Bld 89  70 - 99 mg/dL   BUN 15  6 - 23 mg/dL   Creatinine, Ser 0.92  0.50 - 1.35 mg/dL   Calcium 8.2 (*) 8.4 - 10.5 mg/dL   GFR calc non Af Amer 82 (*) >90 mL/min   GFR calc Af Amer >90  >90 mL/min   Anion gap 10  5 - 15    Disposition:  Follow-up Information   Follow up with [facility], NP On 06/22/2014. (@ 9am )    Specialty:  Cardiology   Contact information:   [address] [place] [address] [phone]       Follow up with [facility]. (The office will call to make an appointment for an ultrasound of your left leg in 1-2 w)    Contact information:   [address] [place] [address] [phone]      Discharge Medications:    Medication List         acetaminophen 500 MG tablet  Commonly known as:  TYLENOL  Take 1,000 mg by mouth daily as needed (for leg pain).     aspirin EC 81 MG tablet  Take 81 mg by mouth daily.     clopidogrel 75 MG tablet  Commonly known as:  PLAVIX  Take 1 tablet (75 mg total) by mouth daily.     cyanocobalamin 1000 MCG/ML injection  Commonly known as:  (VITAMIN B-12)  Inject 1,000 mcg into the muscle every 30 (thirty) days. Last injection 02/25/14     docusate sodium 100 MG capsule  Commonly known as:  COLACE  Take 100 mg by mouth daily as needed (for constipation).     ferrous gluconate 324 MG tablet  Commonly known as:  FERGON  Take 324 mg by mouth daily with breakfast.     losartan 25 MG tablet  Commonly known as:  COZAAR  Take 1 tablet (25 mg total) by mouth daily.     metoprolol 50 MG tablet  Commonly known as:  LOPRESSOR  Take 25 mg by mouth daily.     neomycin-colistin-hydrocortisone-thonzonium 3.01-04-09-0.5 MG/ML otic suspension  Commonly known as:  CORTISPORIN TC  Place 4 drops into the left ear 4 (four) times daily as needed (for itching).     NIFEdipine 90 MG 24 hr tablet  Commonly known as:   PROCARDIA XL/ADALAT-CC  Take 90  mg by mouth daily.     simvastatin 20 MG tablet  Commonly known as:  ZOCOR  Take 20 mg by mouth daily after supper.         Duration of Discharge Encounter: Greater than 30 minutes including physician time.  Angelena Form PA-C 05/24/2014 8:11 AM

## 2014-05-24 NOTE — Discharge Instructions (Signed)
Angiogram, Care After Refer to this sheet in the next few weeks. These instructions provide you with information on caring for yourself after your procedure. Your health care provider may also give you more specific instructions. Your treatment has been planned according to current medical practices, but problems sometimes occur. Call your health care provider if you have any problems or questions after your procedure.  WHAT TO EXPECT AFTER THE PROCEDURE After your procedure, it is typical to have the following sensations:  Minor discomfort or tenderness and a small bump at the catheter insertion site. The bump should usually decrease in size and tenderness within 1 to 2 weeks.  Any bruising will usually fade within 2 to 4 weeks. HOME CARE INSTRUCTIONS   You may need to keep taking blood thinners if they were prescribed for you. Only take over-the-counter or prescription medicines for pain, fever, or discomfort as directed by your health care provider.  Do not apply powder or lotion to the site.  Do not sit in a bathtub, swimming pool, or whirlpool for 5 to 7 days.  You may shower 24 hours after the procedure. Remove the bandage (dressing) and gently wash the site with plain soap and water. Gently pat the site dry.  Inspect the site at least twice daily.  Limit your activity for the first 48 hours. Do not bend, squat, or lift anything over 20 lb (9 kg) or as directed by your health care provider.  Do not drive home if you are discharged the day of the procedure. Have someone else drive you. Follow instructions about when you can drive or return to work. SEEK MEDICAL CARE IF:  You get lightheaded when standing up.  You have drainage (other than a small amount of blood on the dressing).  You have chills.  You have a fever.  You have redness, warmth, swelling, or pain at the insertion site. SEEK IMMEDIATE MEDICAL CARE IF:   You develop chest pain or shortness of breath, feel faint,  or pass out.  You have bleeding, swelling larger than a walnut, or drainage from the catheter insertion site.  You develop pain, discoloration, coldness, or severe bruising in the leg or arm that held the catheter.  You develop bleeding from any other place, such as the bowels. You may see bright red blood in your urine or stools, or your stools may appear black and tarry.  You have heavy bleeding from the site. If this happens, hold pressure on the site. MAKE SURE YOU:  Understand these instructions.  Will watch your condition.  Will get help right away if you are not doing well or get worse. Document Released: 05/09/2005 Document Revised: 10/26/2013 Document Reviewed: 03/15/2013 [facility] Patient Information 2015 [place], [place]. This information is not intended to replace advice given to you by your health care provider. Make sure you discuss any questions you have with your health care provider.

## 2014-05-30 ENCOUNTER — Telehealth (HOSPITAL_COMMUNITY): Payer: Self-pay | Admitting: *Deleted

## 2014-05-31 ENCOUNTER — Telehealth (HOSPITAL_COMMUNITY): Payer: Self-pay | Admitting: *Deleted

## 2014-05-31 NOTE — Telephone Encounter (Signed)
Pt called to schedule his follow up [facility] after his angio and would like to know when he can return to work. He would like a note ready for him on 06/07/2014 stating his return to work date.

## 2014-05-31 NOTE — H&P (Signed)
Patient ID: Earl Castillo MRN: [id number], DOB/AGE: Jun 11, 1941   Admit date: 05/23/2014   Primary Physician: [name], MD Primary Cardiologist: Dr [name]  HPI: The patient is a 73 year old white male previously followed by [name] and now followed by [name]. His past medical history is significant for CAD, status post coronary artery bypass grafting in 2004 by [name], with a LIMA to his LAD, RIMA to the circumflex marginal and SVG to the distal RCA. He had a Myoview stress test performed 08/23/2012 which was low risk and nonischemic. He also has hypertension and hyperlipidemia. He has a 60+ year history of tobacco use and continues to smoke daily. He has significant peripheral vascular disease. He is status post right carotid endarterectomy performed by [name] in 2006. We have been following his carotid disease yearly by duplex ultrasound. He has also had a left to right fem-fem crossover graft by [name] in May of 2000. He was recently seen by [name] and complained of worsening claudication. Recent lower extremity arterial Doppler studies demonstrated progression of disease in his left common and external iliac arteries. In addition, it was suggested that he had moderately severe distal left SFA disease. Subsequently, on 03/17/2014 he underwent a diagnostic PV angiogram performed by [name]. His fem-fem crossover graft was widely patent. His left iliac also appeared widely patent, suggesting falsely elevated velocities by duplex. He did have a high grade focal calcified mid to distal left SFA stenosis corresponding to an area of abnormality and high velocity in the duplex ultrasound with three-vessel runoff. [name] recommended intervention with diamondback orbital rotational atherectomy, plus or minus PTA and stenting, at a later date. He was discharged home and he now presents back to clinic for post hospital followup.  He was seen by Dr [name] in the office  05/17/14. He was initially scheduled for angiography and diamondback orbital or facial atherectomy of his mid left SFA on 05/02/14 but had to cancel because of an upper respiratory tract infection. This has been rescheduled for 05/23/14. He states that he has been doing fairly well since discharge but he continues to note significant lifestyle limiting left lower extremity claudication. He denies any anterior groin, flank or low back pain, where the cath was performed. He also denies any chest pain or shortness of breath. He also denies any left extremity ulcers and sores.    Problem List: Past Medical History  Diagnosis Date  . Hyperlipidemia   . Hypertension   . Chicken pox   . Coronary artery disease     CABG'04, Low risk Myoview 2013  . Skin exam, screening for cancer     sees [name]   . Hemorrhoid   . GERD (gastroesophageal reflux disease)   . Colon polyps 08/2012    Hyperplastic and adenomatous sessile polyps  . Carotid artery disease     status post carotid endarterectomy by [name]  . Peripheral arterial disease     status post left to right fem-fem crossover graft in by [name]  . Angiodysplasia of intestine - small bowel  09/21/2013  . Vitamin B12 deficiency 11/23/2013  . Nephrolithiasis     "passed them most of the time"  . Anemia   . 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 Sinus headache     "alot" (05/23/2014)  . Osteoarthritis     "hips" (05/23/2014)  . Lower  GI bleeding 08/2013  . Blood transfusion abn reaction or complication, no procedure mishap     "HgB dropped real low"  . History of blood transfusion 08/2013    "HgB dropped real low; related to bleeding from my rectum"    Past Surgical History  Procedure Laterality Date  . Cervical disc surgery  1978    "bulging disc"  . Bypass rt leg Right 2000  . Carotid endarterectomy Right   . Basket extraction of ureteral stone  1970's  .  Basal cell carcinoma excision  May 2009    per [name] , left nose   . Colonoscopy  08-12-12    per [name], tubular adenomas, repeat in 5 yrs   . Cataract extraction w/ intraocular lens  implant, bilateral Bilateral 2014    per [name]  . Esophagogastroduodenoscopy N/A 08/17/2013    Procedure: ESOPHAGOGASTRODUODENOSCOPY (EGD);  Surgeon: [name], MD;  Location: [facility] ENDOSCOPY;  Service: Endoscopy;  Laterality: N/A;  . Cardiac catheterization  08/18/2003    Recommend CABG  . Carotid endarterectomy Right 03/26/2005    Dr Georgina Quint  . Cardiovascular stress test - lexiscan  08/25/2012    No significant wall motion abnormalities  . Transthoracic echocardiogram  08/02/2013    EF 55-60%, normal echocardiogram  . Renal duplex Bilateral 02/02/2013    Bilateral renal arteries demonstrate narrowingconsistent with a 60-99% diameter reduction.  . Capsule endoscopy  10-05-13    per [name], 4 AVM's  . Femoral artery stent Left 05/23/2014  . Band hemorrhoidectomy    . Femoral-femoral bypass graft  2003  . Coronary artery bypass graft  08/30/2003    x3. LIMA-distal LAD, RIMA-circumflex, SVG-distal RCA  . Cystoscopy w/ stone manipulation       Allergies: No Known Allergies   Home Medications No current facility-administered medications for this encounter.   Current Outpatient Prescriptions  Medication Sig Dispense Refill  . acetaminophen (TYLENOL) 500 MG tablet Take 1,000 mg by mouth daily as needed (for leg pain).       Marland Kitchen aspirin EC 81 MG tablet Take 81 mg by mouth daily.      . cyanocobalamin (,VITAMIN B-12,) 1000 MCG/ML injection Inject 1,000 mcg into the muscle every 30 (thirty) days. Last injection 02/25/14      . docusate sodium (COLACE) 100 MG capsule Take 100 mg by mouth daily as needed (for constipation).       . ferrous gluconate (FERGON) 324 MG tablet Take 324 mg by mouth daily with breakfast.      . metoprolol (LOPRESSOR) 50 MG tablet Take 25 mg by mouth  daily.      Marland Kitchen NIFEdipine (PROCARDIA XL/ADALAT-CC) 90 MG 24 hr tablet Take 90 mg by mouth daily.      . simvastatin (ZOCOR) 20 MG tablet Take 20 mg by mouth daily after supper.      . clopidogrel (PLAVIX) 75 MG tablet Take 1 tablet (75 mg total) by mouth daily.  30 tablet  11  . losartan (COZAAR) 25 MG tablet Take 1 tablet (25 mg total) by mouth daily.  30 tablet  11  . neomycin-colistin-hydrocortisone-thonzonium (CORTISPORIN TC) 3.01-04-09-0.5 MG/ML otic suspension Place 4 drops into the left ear 4 (four) times daily as needed (for itching).          Family History  Problem Relation Age of Onset  . Colon cancer Neg Hx   . Heart disease Father      History   Social History  .  Marital Status: Married    Spouse Name: N/A    Number of Children: 2  . Years of Education: N/A   Occupational History  .  Semi Retired Geophysicist/field seismologist    Social History Main Topics  . Smoking status: Current Every Day Smoker -- 0.33 packs/day for 58 years    Types: Cigarettes  . Smokeless tobacco: Never Used  . Alcohol Use: No  . Drug Use: No  . Sexual Activity: Not Currently   Other Topics Concern  . Not on file   Social History Narrative   Daily caffeine      Review of Systems: General: negative for chills, fever, night sweats or weight changes.  Cardiovascular: negative for chest pain, dyspnea on exertion, edema, orthopnea, palpitations, paroxysmal nocturnal dyspnea or shortness of breath Dermatological: negative for rash Respiratory: negative for cough or wheezing Urologic: negative for hematuria Abdominal: negative for nausea, vomiting, diarrhea, bright red blood per rectum, melena, or hematemesis Neurologic: negative for visual changes, syncope, or dizziness All other systems reviewed and are otherwise negative except as noted above.  Physical Exam: Blood pressure 188/59, pulse 59, temperature 98.7 F (37.1 C), temperature source Oral, resp. rate 20, height 5' 6.5" (1.689 m), weight 140 lb 10.5  oz (63.8 kg), SpO2 96.00%.  General appearance: alert, cooperative and no distress Neck: no adenopathy, no JVD, supple, symmetrical, trachea midline, thyroid not enlarged, symmetric, no tenderness/mass/nodules and bilateral carotid bruits Rt > Lt Lungs: clear to auscultation bilaterally Heart: regular rate and rhythm, S1, S2 normal, no murmur, click, rub or gallop Abdomen: soft, non-tender; bowel sounds normal; no masses,  no organomegaly Extremities: no edema Pulses: diminnished lower extremity pulses bilat Skin: Skin color, texture, turgor normal. No rashes or lesions Neurologic: Grossly normal    Labs:  No results found for this or any previous visit (from the past 24 hour(s)).   Radiology/Studies: No results found.  EKG:05/17/14- NSR, SB  ASSESSMENT AND PLAN:  Principal Problem:   Claudication Active Problems:   PVD- Lt SFA disease by PVA June 2015   HYPERTENSION   CAD-CABG '04, low risk Myoview 2013   HYPERLIPIDEMIA   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   Current smoker   PLAN:    Henri Medal, PA-C 05/31/2014, 8:02 AM

## 2014-05-31 NOTE — Telephone Encounter (Signed)
OK to return to work depending on follow up dopplers.  Kerin Ransom PA-C 05/31/2014 11:33 AM

## 2014-05-31 NOTE — Telephone Encounter (Signed)
Will defer to Kerin Ransom PA  Patient was discharge on 05/24/14

## 2014-05-31 NOTE — Telephone Encounter (Signed)
Per Lurena Joiner, may return to work if doppler are okay. Informed patient to call office on  Late 06/09/14 to see if doppler has been read. And note can be ent.

## 2014-05-31 NOTE — H&P (Signed)
Agree with above  [name], M.D., [place], [facility], [facility], [place] [address] [place], [place]  [address]  [phone] 05/31/2014 1:23 PM

## 2014-06-02 ENCOUNTER — Encounter (HOSPITAL_COMMUNITY): Payer: Medicare Other

## 2014-06-03 ENCOUNTER — Telehealth: Payer: Self-pay | Admitting: Family Medicine

## 2014-06-03 ENCOUNTER — Ambulatory Visit (INDEPENDENT_AMBULATORY_CARE_PROVIDER_SITE_OTHER): Payer: Medicare Other | Admitting: Family Medicine

## 2014-06-03 DIAGNOSIS — E538 Deficiency of other specified B group vitamins: Secondary | ICD-10-CM | POA: Diagnosis not present

## 2014-06-03 MED ORDER — CYANOCOBALAMIN 1000 MCG/ML IJ SOLN
1000.0000 ug | Freq: Once | INTRAMUSCULAR | Status: AC
  Administered 2014-06-03: 1000 ug via INTRAMUSCULAR

## 2014-06-03 MED ORDER — NEOMYCIN-COLIST-HC-THONZONIUM 3.3-3-10-0.5 MG/ML OT SUSP: 4.0000 [drp] | Freq: Four times a day (QID) | OTIC | Status: DC | PRN

## 2014-06-03 NOTE — Addendum Note (Signed)
Addended by: Aggie Hacker A on: 06/03/2014 11:51 AM   Modules accepted: Orders

## 2014-06-03 NOTE — Telephone Encounter (Signed)
Refill request for Neomycin ear drops. Per Dr.Fry, okay to refill and I did send script e-scribe to [facility], also spoke with pt.

## 2014-06-07 ENCOUNTER — Ambulatory Visit (HOSPITAL_COMMUNITY)
Admission: RE | Admit: 2014-06-07 | Discharge: 2014-06-07 | Disposition: A | Discharge status: Home / Self Care | Payer: Medicare Other | Source: Ambulatory Visit | Attending: Internal Medicine | Admitting: Internal Medicine

## 2014-06-07 ENCOUNTER — Telehealth: Payer: Self-pay | Admitting: Cardiovascular Disease

## 2014-06-07 DIAGNOSIS — I739 Peripheral vascular disease, unspecified: Secondary | ICD-10-CM | POA: Diagnosis not present

## 2014-06-07 NOTE — Telephone Encounter (Signed)
Pt was in today for f/u dopplers (arterials). He was told he can't go back to work until he has his follow up visit with the PA.  That appt is not until the  August 19.  Wants to know if he can go back to work earlier.Marland KitchenMarland KitchenMarland Kitchen

## 2014-06-07 NOTE — Telephone Encounter (Signed)
Spoke with patient's wife. Informed her that patient cannot return to work until after his appointment. He can call back periodically to see if there has been any cancelled appointments where he can be seen sooner than August 19th. She voiced understanding and will notify the patient.

## 2014-06-07 NOTE — Progress Notes (Signed)
Arterial Lower Ext. Left Completed. Oda Cogan, BS, RDMS, RVT

## 2014-06-09 ENCOUNTER — Encounter: Payer: Self-pay | Admitting: *Deleted

## 2014-06-09 NOTE — Telephone Encounter (Signed)
Patient notified. Letter created. Patient will pick up letter in am.

## 2014-06-09 NOTE — Telephone Encounter (Signed)
OK to return to work 

## 2014-06-09 NOTE — Telephone Encounter (Signed)
Earl Castillo, can you please take a look at the dopplers?

## 2014-06-22 ENCOUNTER — Ambulatory Visit (INDEPENDENT_AMBULATORY_CARE_PROVIDER_SITE_OTHER): Payer: Medicare Other | Admitting: Cardiology

## 2014-06-22 ENCOUNTER — Encounter: Payer: Self-pay | Admitting: Cardiology

## 2014-06-22 VITALS — BP 177/78 | HR 53 | Ht 66.0 in | Wt 143.3 lb

## 2014-06-22 DIAGNOSIS — F17200 Nicotine dependence, unspecified, uncomplicated: Secondary | ICD-10-CM

## 2014-06-22 DIAGNOSIS — K922 Gastrointestinal hemorrhage, unspecified: Secondary | ICD-10-CM | POA: Insufficient documentation

## 2014-06-22 DIAGNOSIS — I2510 Atherosclerotic heart disease of native coronary artery without angina pectoris: Secondary | ICD-10-CM | POA: Diagnosis not present

## 2014-06-22 DIAGNOSIS — Z79899 Other long term (current) drug therapy: Secondary | ICD-10-CM | POA: Diagnosis not present

## 2014-06-22 DIAGNOSIS — I10 Essential (primary) hypertension: Secondary | ICD-10-CM

## 2014-06-22 DIAGNOSIS — K2991 Gastroduodenitis, unspecified, with bleeding: Secondary | ICD-10-CM

## 2014-06-22 DIAGNOSIS — I739 Peripheral vascular disease, unspecified: Secondary | ICD-10-CM | POA: Diagnosis not present

## 2014-06-22 DIAGNOSIS — K2961 Other gastritis with bleeding: Secondary | ICD-10-CM

## 2014-06-22 DIAGNOSIS — K2971 Gastritis, unspecified, with bleeding: Secondary | ICD-10-CM

## 2014-06-22 LAB — BASIC METABOLIC PANEL
BUN: 15 mg/dL (ref 6–23)
CALCIUM: 8.7 mg/dL (ref 8.4–10.5)
CO2: 29 mEq/L (ref 19–32)
CREATININE: 0.86 mg/dL (ref 0.50–1.35)
Chloride: 105 mEq/L (ref 96–112)
GLUCOSE: 93 mg/dL (ref 70–99)
Potassium: 4 mEq/L (ref 3.5–5.3)
Sodium: 141 mEq/L (ref 135–145)

## 2014-06-22 LAB — CBC
HCT: 40 % (ref 39.0–52.0)
Hemoglobin: 14 g/dL (ref 13.0–17.0)
MCH: 32.9 pg (ref 26.0–34.0)
MCHC: 35 g/dL (ref 30.0–36.0)
MCV: 94.1 fL (ref 78.0–100.0)
PLATELETS: 167 10*3/uL (ref 150–400)
RBC: 4.25 MIL/uL (ref 4.22–5.81)
RDW: 14.1 % (ref 11.5–15.5)
WBC: 9.2 10*3/uL (ref 4.0–10.5)

## 2014-06-22 NOTE — Patient Instructions (Addendum)
Your physician recommends that you schedule a follow-up appointment in: 4-6 Weeks with Dr [name]  Your physician recommends that you return for lab work BMP today  Your physician recommends that you schedule a follow-up appointment in: 1 Weeks Blood Pressure check with Cyril Mourning

## 2014-06-22 NOTE — Assessment & Plan Note (Signed)
Last year positive GI bleed now on aspirin and Plavix. He has some bruising but no bloody stools he is on iron which makes it difficult to tell if the stools become dark we'll check a CBC today.

## 2014-06-22 NOTE — Assessment & Plan Note (Signed)
Patient smokes one pack over 2-3 days.  We discussed ways to stop he has tried the cigarettes. Currently he is happy at his decreased status he will try and cut back further but he continues to enjoy tobacco.

## 2014-06-22 NOTE — Assessment & Plan Note (Signed)
Continues to be elevated but he did not take meds today.  Will have him check a basic metabolic panel since this is a new medication for him his Cozaar And recheck blood pressure after he takes his meds next week with Erasmo Downer

## 2014-06-22 NOTE — Progress Notes (Signed)
06/22/2014   PCP: [name], MD   Chief Complaint  Patient presents with  . Follow-up    follow-up PV angio    Primary Cardiologist:Dr. Adora Fridge   HPI:  73 year old white male previously followed by [name] and now followed by [name]. His past medical history is significant for CAD, status post coronary artery bypass grafting in 2004 by [name], with a LIMA to his LAD, RIMA to the circumflex marginal and SVG to the distal RCA. He had a Myoview stress test performed 08/23/2012 which was low risk and nonischemic. He also has hypertension and hyperlipidemia. He has a 60+ year history of tobacco use and continues to smoke daily. He has significant peripheral vascular disease. He is status post right carotid endarterectomy performed by Dr. Lindwood Qua in 2006. We have been following his carotid disease yearly by duplex ultrasound. He has also had a left to right fem-fem crossover graft by [name] in May of 2000. He was recently seen by [name] and complained of worsening claudication. Recent lower extremity arterial Doppler studies demonstrated progression of disease in his left common and external iliac arteries. In addition, it was suggested that he had moderately severe distal left SFA disease. Subsequently, on 03/17/2014 he underwent a diagnostic PV angiogram performed by [name]. His fem-fem crossover graft was widely patent. His left iliac also appeared widely patent, suggesting falsely elevated velocities by duplex. He did have a high grade focal calcified mid to distal left SFA stenosis corresponding to an area of abnormality and high velocity in the duplex ultrasound with three-vessel runoff. [name] recommended intervention with diamondback orbital rotational atherectomy, plus or minus PTA and stenting.    On 05/23/2014 patient underwent successful diamondback orbital rotational atherectomy, drug-eluting balloon angioplasty mid left SFA and stenting  proximal left SFA with access using the fem-fem crossover graft by [name].  Patient did well and has had followup Dopplers with improvement in the left from 0.87-0.99.  The symptoms are pretty well resolved except for when he is very tired.    Unfortunately he still smokes one pack will last 2-3 days. He enjoys his tobacco use. We discussed the importance of stopping at this time he does not seem that interested.  He has been on Plavix for an RAD has bruising he does have a history of a GI bleed last year. We'll check a CBC today to ensure that is stable. Additionally his blood pressure was elevated and it was in the hospital as well. He was started on an arm we'll check a basic metabolic panel.    No Known Allergies  Current Outpatient Prescriptions  Medication Sig Dispense Refill  . acetaminophen (TYLENOL) 500 MG tablet Take 1,000 mg by mouth daily as needed (for leg pain).       Marland Kitchen aspirin EC 81 MG tablet Take 81 mg by mouth daily.      . clopidogrel (PLAVIX) 75 MG tablet Take 1 tablet (75 mg total) by mouth daily.  30 tablet  11  . cyanocobalamin (,VITAMIN B-12,) 1000 MCG/ML injection Inject 1,000 mcg into the muscle every 30 (thirty) days. Last injection 02/25/14      . docusate sodium (COLACE) 100 MG capsule Take 100 mg by mouth daily as needed (for constipation).       . ferrous gluconate (FERGON) 324 MG tablet Take 324 mg by mouth daily with breakfast.      .  losartan (COZAAR) 25 MG tablet Take 1 tablet (25 mg total) by mouth daily.  30 tablet  11  . metoprolol (LOPRESSOR) 50 MG tablet Take 25 mg by mouth daily.      Marland Kitchen neomycin-colistin-hydrocortisone-thonzonium (CORTISPORIN TC) 3.01-04-09-0.5 MG/ML otic suspension Place 4 drops into the left ear 4 (four) times daily as needed (for itching).  10 mL  0  . NIFEdipine (PROCARDIA XL/ADALAT-CC) 90 MG 24 hr tablet Take 90 mg by mouth daily.      . simvastatin (ZOCOR) 20 MG tablet Take 20 mg by mouth daily after supper.       No current  facility-administered medications for this visit.    Past Medical History  Diagnosis Date  . Hyperlipidemia   . Hypertension   . Chicken pox   . Coronary artery disease     CABG'04, Low risk Myoview 2013  . Skin exam, screening for cancer     sees [name]   . Hemorrhoid   . GERD (gastroesophageal reflux disease)   . Colon polyps 08/2012    Hyperplastic and adenomatous sessile polyps  . Carotid artery disease     status post carotid endarterectomy by [name]  . Peripheral arterial disease     status post left to right fem-fem crossover graft in by [name]  . Angiodysplasia of intestine - small bowel  09/21/2013  . Vitamin B12 deficiency 11/23/2013  . Nephrolithiasis     "passed them most of the time"  . Anemia   . 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 Sinus headache     "alot" (05/23/2014)  . Osteoarthritis     "hips" (05/23/2014)  . Lower GI bleeding 08/2013  . Blood transfusion abn reaction or complication, no procedure mishap     "HgB dropped real low"  . History of blood transfusion 08/2013    "HgB dropped real low; related to bleeding from my rectum"    Past Surgical History  Procedure Laterality Date  . Cervical disc surgery  1978    "bulging disc"  . Bypass rt leg Right 2000  . Carotid endarterectomy Right   . Basket extraction of ureteral stone  1970's  . Basal cell carcinoma excision  May 2009    per [name] , left nose   . Colonoscopy  08-12-12    per [name], tubular adenomas, repeat in 5 yrs   . Cataract extraction w/ intraocular lens  implant, bilateral Bilateral 2014    per [name]  . Esophagogastroduodenoscopy N/A 08/17/2013    Procedure: ESOPHAGOGASTRODUODENOSCOPY (EGD);  Surgeon: [name], MD;  Location: [facility] ENDOSCOPY;  Service: Endoscopy;  Laterality: N/A;  . Cardiac catheterization  08/18/2003    Recommend CABG  . Carotid endarterectomy Right  03/26/2005    Dr Georgina Quint  . Cardiovascular stress test - lexiscan  08/25/2012    No significant wall motion abnormalities  . Transthoracic echocardiogram  08/02/2013    EF 55-60%, normal echocardiogram  . Renal duplex Bilateral 02/02/2013    Bilateral renal arteries demonstrate narrowingconsistent with a 60-99% diameter reduction.  . Capsule endoscopy  10-05-13    per [name], 4 AVM's  . Femoral artery stent Left 05/23/2014  . Band hemorrhoidectomy    . Femoral-femoral bypass graft  2003  . Coronary artery bypass graft  08/30/2003    x3. LIMA-distal LAD, RIMA-circumflex, SVG-distal RCA  . Cystoscopy w/ stone manipulation  VXY:[id number] colds or fevers, no weight changes Skin:no rashes or ulcers HEENT:no blurred vision, no congestion CV:see HPI PUL:see HPI GI:no diarrhea constipation or melena, no indigestion GU:no hematuria, no dysuria MS:no joint pain, no claudication except when he is tired.,  No pain at cath site Neuro:no syncope, no lightheadedness Endo:no diabetes, no thyroid disease  Wt Readings from Last 3 Encounters:  06/22/14 143 lb 4.8 oz (65 kg)  05/24/14 140 lb 10.5 oz (63.8 kg)  05/24/14 140 lb 10.5 oz (63.8 kg)    PHYSICAL EXAM BP 177/78  Pulse 53  Ht 5\' 6"  (1.676 m)  Wt 143 lb 4.8 oz (65 kg)  BMI 23.14 kg/m2 Recheck BP 180/80  General:Pleasant affect, NAD Skin:Warm and dry, brisk capillary refill HEENT:normocephalic, sclera clear, mucus membranes moist Neck:supple, no JVD, no bruits  Heart:S1S2 RRR without murmur, gallup, rub or click Lungs:clear without rales, rhonchi, or wheezes MOL:MBEM, non tender, + BS, do not palpate liver spleen or masses Ext:no lower ext edema, 1+ pedal pulses, 2+ radial pulses Neuro:alert and oriented, MAE, follows commands, + facial symmetry   ASSESSMENT AND PLAN HYPERTENSION Continues to be elevated but he did not take meds today.  Will have him check a basic metabolic panel since this is a new medication for  him his Cozaar And recheck blood pressure after he takes his meds next week with Erasmo Downer  PVD- Lt SFA PTA/stent 05/23/14 Claudication has resolved except with extreme fatigue. His post procedure Dopplers 0.99 ABI in the left up from 0.87 and in the right 1.0 up from 0.82.  Follow with [name] and 4-6 weeks.    GI bleed Last year positive GI bleed now on aspirin and Plavix. He has some bruising but no bloody stools he is on iron which makes it difficult to tell if the stools become dark we'll check a CBC today.  Current smoker Patient smokes one pack over 2-3 days.  We discussed ways to stop he has tried the cigarettes. Currently he is happy at his decreased status he will try and cut back further but he continues to enjoy tobacco.    He'll follow with [name] in one month.

## 2014-06-22 NOTE — Assessment & Plan Note (Signed)
Claudication has resolved except with extreme fatigue. His post procedure Dopplers 0.99 ABI in the left up from 0.87 and in the right 1.0 up from 0.82.  Follow with [name] and 4-6 weeks.

## 2014-06-27 ENCOUNTER — Telehealth: Payer: Self-pay

## 2014-06-27 NOTE — Telephone Encounter (Signed)
Patient notified

## 2014-06-27 NOTE — Telephone Encounter (Signed)
Message copied by Marlon Pel on Mon Jun 27, 2014  3:00 PM ------      Message from: Silvano Rusk E      Created: Mon Jun 27, 2014  2:53 PM       Yes      CBC NL            Should stay on ferrous sulfate and have a CBC every 4 months             PCP to do if that works      ----- Message -----         From: Kellie Moor, RN         Sent: 06/27/2014   2:35 PM           To: Gatha Mayer, MD            Patient was to have a follow up CBC after 4 months for iron def anemia.  Please see results from 06/22/14.  Original results and orders were from 4/15.  Is this result ok?      ----- Message -----         From: Kellie Moor, RN         Sent: 06/18/2014           To: Kellie Moor, RN            Needs cbc - see results 4/15             ------

## 2014-07-01 ENCOUNTER — Ambulatory Visit (INDEPENDENT_AMBULATORY_CARE_PROVIDER_SITE_OTHER): Payer: Medicare Other | Admitting: Family Medicine

## 2014-07-01 ENCOUNTER — Ambulatory Visit (INDEPENDENT_AMBULATORY_CARE_PROVIDER_SITE_OTHER): Payer: Medicare Other | Admitting: Pharmacist Clinician (PhC)/ Clinical Pharmacy Specialist

## 2014-07-01 VITALS — BP 160/64 | HR 52 | Ht 66.0 in | Wt 143.1 lb

## 2014-07-01 DIAGNOSIS — Z23 Encounter for immunization: Secondary | ICD-10-CM

## 2014-07-01 DIAGNOSIS — I2510 Atherosclerotic heart disease of native coronary artery without angina pectoris: Secondary | ICD-10-CM | POA: Diagnosis not present

## 2014-07-01 DIAGNOSIS — E538 Deficiency of other specified B group vitamins: Secondary | ICD-10-CM | POA: Diagnosis not present

## 2014-07-01 DIAGNOSIS — I10 Essential (primary) hypertension: Secondary | ICD-10-CM | POA: Diagnosis not present

## 2014-07-01 MED ORDER — CYANOCOBALAMIN 1000 MCG/ML IJ SOLN
1000.0000 ug | Freq: Once | INTRAMUSCULAR | Status: AC
  Administered 2014-07-01: 1000 ug via INTRAMUSCULAR

## 2014-07-01 NOTE — Patient Instructions (Signed)
Return for a a follow up appointment in 2 months  Your blood pressure today is 160/64  (goal <150/90)  Take your BP meds as follows - increase losartan to 50 mg once daily, take in the evenings with your simvastatin.  Continue nifedipine and metoprolol in mornings  Exerrcise as you're able, try to walk approximately 30 minutes per day.  Keep salt intake to a minimum, especially watch canned and prepared boxed foods.  Eat more fresh fruits and vegetables and fewer canned items.  Avoid eating in fast food restaurants.

## 2014-07-02 ENCOUNTER — Encounter: Payer: Self-pay | Admitting: Pharmacist Clinician (PhC)/ Clinical Pharmacy Specialist

## 2014-07-02 MED ORDER — LOSARTAN POTASSIUM 50 MG PO TABS: 50.0000 mg | ORAL_TABLET | Freq: Every day | ORAL | Status: DC

## 2014-07-02 NOTE — Assessment & Plan Note (Addendum)
Today Earl Castillo's blood pressure is improved from previous visits, but still not at goal.  He is not diabetic, nor does he have any kidney disease, so his goal is to stay at <150/90.  I am going to increase his losartan from 25 to 50mg  daily and have asked that he come back in 2 months for follow up.  We discussed the impact of sodium and exercise on blood pressure and he was encouraged to decrease his use of the salt shaker by picking out only a few foods that taste better with salt, leaving it off of everything else.  I also had him shift the time of day he takes medications, so that his losartan is taken in the evenings with simvastatin, while the metoprolol and nifedipine xl are taken each morning.  I asked that he write down [facility].  I will see him again in 2 months for follow up.

## 2014-07-02 NOTE — Progress Notes (Signed)
07/02/2014 Earl Castillo Reach October 09, 1941 [id number]   HPI:  Earl Castillo is a 73 y.o. male patient of Dr [name], with a PMH below who presents today for a blood pressure check.  Earl Castillo saw g.  He was recently hospitalized for claudication problems and underwent a successful diamondback orbital rotational atherectomy, drug-eluding balloon angioplasty mid left SFA and stenting proximal left SFA.  On hospital discharge his blood pressure was noted to be elevated and he was started on losartan 25mg .  He then saw Cecilie Kicks NP approximately 10 days ago.  His BP was still elevated that day at 177/78, but patient admitted to not having taken his medications that morning.  Blood word done after that visit show normal creatinine and potassium.    Today he states that he did take his medications before coming into the office.  He does not have a home BP monitor, but did check [facility] at [facility], although he cannot recall what the results were.  His family history is strong for CAD.  His father suffered his first MI at the age of 43, then had 3 more over the next few years, dying at the age of 7.  He has 8 siblings, 4 of which are still living.  One brother and one sister both had multiple CABGs and one brother died at age 3, within a year of his first MI.  One sister apparently also had RAS.  He has two sons, 64 and 14, neither of which has been noted to have any heart disease yet.    Earl [place] started smoking at the age of 24.  He has been admonished to quit smoking for some time, and states that now a pack will last him about 4-5 days.  He does not wish to further decrease his smoking.  He does not drink alcohol, and only 2 cups of coffee per day.  He does like salt, stating that his wife does not use salt while cooking, but he does salt foods at the table.  Because of arthritis in his hip, he is unable to exercise much, although he does get out daily to walk his dog.         Current Outpatient Prescriptions  Medication Sig Dispense Refill  . acetaminophen (TYLENOL) 500 MG tablet Take 1,000 mg by mouth daily as needed (for leg pain).       Marland Kitchen aspirin EC 81 MG tablet Take 81 mg by mouth daily.      . clopidogrel (PLAVIX) 75 MG tablet Take 1 tablet (75 mg total) by mouth daily.  30 tablet  11  . cyanocobalamin (,VITAMIN B-12,) 1000 MCG/ML injection Inject 1,000 mcg into the muscle every 30 (thirty) days. Last injection 02/25/14      . docusate sodium (COLACE) 100 MG capsule Take 100 mg by mouth daily as needed (for constipation).       . ferrous gluconate (FERGON) 324 MG tablet Take 324 mg by mouth daily with breakfast.      . losartan (COZAAR) 25 MG tablet Take 1 tablet (25 mg total) by mouth daily.  30 tablet  11  . metoprolol (LOPRESSOR) 50 MG tablet Take 25 mg by mouth daily.      Marland Kitchen neomycin-colistin-hydrocortisone-thonzonium (CORTISPORIN TC) 3.01-04-09-0.5 MG/ML otic suspension Place 4 drops into the left ear 4 (four) times daily as needed (for itching).  10 mL  0  . NIFEdipine (PROCARDIA XL/ADALAT-CC) 90 MG  24 hr tablet Take 90 mg by mouth daily.      . simvastatin (ZOCOR) 20 MG tablet Take 20 mg by mouth daily after supper.       No current facility-administered medications for this visit.    No Known Allergies  Past Medical History  Diagnosis Date  . Hyperlipidemia   . Hypertension   . Chicken pox   . Coronary artery disease     CABG'04, Low risk Myoview 2013  . Skin exam, screening for cancer     sees [name]   . Hemorrhoid   . GERD (gastroesophageal reflux disease)   . Colon polyps 08/2012    Hyperplastic and adenomatous sessile polyps  . Carotid artery disease     status post carotid endarterectomy by [name]  . Peripheral arterial disease     status post left to right fem-fem crossover graft in by [name]  . Angiodysplasia of intestine - small bowel  09/21/2013  . Vitamin B12 deficiency 11/23/2013  .  Nephrolithiasis     "passed them most of the time"  . Anemia   . 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 Sinus headache     "alot" (05/23/2014)  . Osteoarthritis     "hips" (05/23/2014)  . Lower GI bleeding 08/2013  . Blood transfusion abn reaction or complication, no procedure mishap     "HgB dropped real low"  . History of blood transfusion 08/2013    "HgB dropped real low; related to bleeding from my rectum"    Blood pressure 160/64, pulse 52, height 5\' 6"  (1.676 m), weight 143 lb 1.6 oz (64.91 kg).   ASSESSMENT AND PLAN:  Earl Castillo PharmD CPP [facility]

## 2014-07-18 ENCOUNTER — Other Ambulatory Visit: Payer: Self-pay

## 2014-07-18 MED ORDER — CLOPIDOGREL BISULFATE 75 MG PO TABS
75.0000 mg | ORAL_TABLET | Freq: Every day | ORAL | Status: DC
Start: 2014-07-18 — End: 2015-07-14

## 2014-07-18 NOTE — Telephone Encounter (Signed)
Rx was sent to pharmacy electronically. 

## 2014-07-29 ENCOUNTER — Ambulatory Visit (INDEPENDENT_AMBULATORY_CARE_PROVIDER_SITE_OTHER): Payer: Medicare Other | Admitting: Family Medicine

## 2014-07-29 DIAGNOSIS — E538 Deficiency of other specified B group vitamins: Secondary | ICD-10-CM

## 2014-07-29 MED ORDER — CYANOCOBALAMIN 1000 MCG/ML IJ SOLN
1000.0000 ug | Freq: Once | INTRAMUSCULAR | Status: AC
  Administered 2014-07-29: 1000 ug via INTRAMUSCULAR

## 2014-08-01 ENCOUNTER — Other Ambulatory Visit: Payer: Self-pay

## 2014-08-01 MED ORDER — FERROUS GLUCONATE 324 (38 FE) MG PO TABS: 324.0000 mg | ORAL_TABLET | Freq: Every day | ORAL | Status: DC

## 2014-08-10 ENCOUNTER — Ambulatory Visit: Payer: Medicare Other | Admitting: Cardiovascular Disease

## 2014-08-26 ENCOUNTER — Ambulatory Visit (INDEPENDENT_AMBULATORY_CARE_PROVIDER_SITE_OTHER): Payer: Medicare Other | Admitting: Pharmacist Clinician (PhC)/ Clinical Pharmacy Specialist

## 2014-08-26 VITALS — BP 168/72 | Ht 66.0 in | Wt 144.0 lb

## 2014-08-26 DIAGNOSIS — I2510 Atherosclerotic heart disease of native coronary artery without angina pectoris: Secondary | ICD-10-CM | POA: Diagnosis not present

## 2014-08-26 DIAGNOSIS — I10 Essential (primary) hypertension: Secondary | ICD-10-CM

## 2014-08-26 NOTE — Patient Instructions (Signed)
Return for a a follow up appointment with [name] in November  Your blood pressure today is 174/76  (goal is <15090  Check your blood pressure at home as lle erecord of the readings.       Take your BP meds as     your recordome bl of hood pressures to your next appointment.  Exercise as you're able, try to walk approximately 30 minutes per day.  Keep salt intake to a minimum, especially watch canned and prepared boxed foods.  Eat more fresh fruits and vegetables and fewer canned items.  Avoid eating in fast food restaurants.    HOW TO TAKE YOUR BLOOD PRESSURE:   Rest 5 minutes before taking your blood pressur at least 30 minutes before.   Take your blood pressure before (not after) you eat.   Sit comfortably with your back supportede.    Don't smoke or drink caffeinated beverages for and both feet on the floor (don't cross your legs).   Elevate your arm to heart level on a table or a desk.   Use the proper sized cuff. It should fit smoothly and snugly around your bare upper arm. There should be enough room to slip a fingertip under the cuff. The bottom edge of the cuff should be 1 inch above the crease of the elbow.   Ideally, take 3 measurements at one sitting and record the average.

## 2014-08-28 ENCOUNTER — Encounter: Payer: Self-pay | Admitting: Pharmacist Clinician (PhC)/ Clinical Pharmacy Specialist

## 2014-08-28 MED ORDER — LOSARTAN POTASSIUM 100 MG PO TABS: 100.0000 mg | ORAL_TABLET | Freq: Every day | ORAL | Status: DC

## 2014-08-28 NOTE — Assessment & Plan Note (Signed)
Today Mr. Jobin's blood pressure remains elevated.  I am going to increase his losartan to 100mg  each evening.  If this does not bring his readings down, I would consider switching his nifedipine xl to amlodipine, which should give [place] more of a decrease in pressure.  He is due to see [name] in November, and I will see him after that, should his pressure remain elevated.

## 2014-08-28 NOTE — Progress Notes (Signed)
08/28/2014 Earl Castillo [birth year] [id number]   HPI:  Earl Castillo is a 73 y.o. male patient of Dr [name], with a PMH below who presents today for a blood pressure check.  When I last saw him his pressure was elevated at 160/64 and I doubled his losartan from 25mg  to 50mg  daily.  It doesn't appear to have had much affect on his readings.    Today he states that he did take his medications before coming into the office.  He does not have a home BP monitor, and [facility]s and 160s.  His family history is strong for CAD.  His father suffered his first MI at the age of 52, then had 3 more over the next few years, dying at the age of 81.  He has 8 siblings, 4 of which are still living.  One brother and one sister both had multiple CABGs and one brother died at age 62, within a year of his first MI.  One sister apparently also had RAS.  He has two sons, 10 and 80, neither of which has been noted to have any heart disease yet.    [name] [place], started smoking at the age of 73.  He has been admonished to quit smoking for some time, and states that now a pack will last him about 4-5 days.  He does not wish to further decrease his smoking.  He does not drink alcohol, and only 2 cups of coffee per day.  He does like salt, stating that his wife does not use salt while cooking, but he does salt foods at the table.  Because of arthritis in his hip, he is unable to exercise much, although he does get out daily to walk his dog, as well as spending time stacking wood.   Current Outpatient Prescriptions  Medication Sig Dispense Refill  . acetaminophen (TYLENOL) 500 MG tablet Take 1,000 mg by mouth daily as needed (for leg pain).       Marland Kitchen aspirin EC 81 MG tablet Take 81 mg by mouth daily.      . clopidogrel (PLAVIX) 75 MG tablet Take 1 tablet (75 mg total) by mouth daily.  30 tablet  11  . cyanocobalamin (,VITAMIN B-12,) 1000 MCG/ML injection Inject  1,000 mcg into the muscle every 30 (thirty) days. Last injection 02/25/14      . docusate sodium (COLACE) 100 MG capsule Take 100 mg by mouth daily as needed (for constipation).       . ferrous gluconate (FERGON) 324 MG tablet Take 1 tablet (324 mg total) by mouth daily with breakfast.  30 tablet  2  . metoprolol (LOPRESSOR) 50 MG tablet Take 25 mg by mouth daily.      Marland Kitchen neomycin-colistin-hydrocortisone-thonzonium (CORTISPORIN TC) 3.01-04-09-0.5 MG/ML otic suspension Place 4 drops into the left ear 4 (four) times daily as needed (for itching).  10 mL  0  . NIFEdipine (PROCARDIA XL/ADALAT-CC) 90 MG 24 hr tablet Take 90 mg by mouth daily.      . simvastatin (ZOCOR) 20 MG tablet Take 20 mg by mouth daily after supper.      . [DISCONTINUED] losartan (COZAAR) 50 MG tablet Take 1 tablet (50 mg total) by mouth daily.  90 tablet  3   No current facility-administered medications for this visit.    No Known Allergies  Past Medical History  Diagnosis Date  . Hyperlipidemia   .  Hypertension   . Chicken pox   . Coronary artery disease     CABG'04, Low risk Myoview 2013  . Skin exam, screening for cancer     sees [name]   . Hemorrhoid   . GERD (gastroesophageal reflux disease)   . Colon polyps 08/2012    Hyperplastic and adenomatous sessile polyps  . Carotid artery disease     status post carotid endarterectomy by [name]  . Peripheral arterial disease     status post left to right fem-fem crossover graft in by [name]  . Angiodysplasia of intestine - small bowel  09/21/2013  . Vitamin B12 deficiency 11/23/2013  . Nephrolithiasis     "passed them most of the time"  . Anemia   . 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 Sinus headache     "alot" (05/23/2014)  . Osteoarthritis     "hips" (05/23/2014)  . Lower GI bleeding 08/2013  . Blood transfusion abn reaction or complication, no procedure mishap     "HgB dropped  real low"  . History of blood transfusion 08/2013    "HgB dropped real low; related to bleeding from my rectum"    Blood pressure 168/72, height 5\' 6"  (1.676 m), weight 144 lb (65.318 kg).   ASSESSMENT AND PLAN:  Tommy Medal PharmD CPP [facility]

## 2014-09-02 ENCOUNTER — Ambulatory Visit (INDEPENDENT_AMBULATORY_CARE_PROVIDER_SITE_OTHER): Payer: Medicare Other | Admitting: Family Medicine

## 2014-09-02 DIAGNOSIS — E538 Deficiency of other specified B group vitamins: Secondary | ICD-10-CM

## 2014-09-02 MED ORDER — CYANOCOBALAMIN 1000 MCG/ML IJ SOLN
1000.0000 ug | Freq: Once | INTRAMUSCULAR | Status: AC
  Administered 2014-09-02: 1000 ug via INTRAMUSCULAR

## 2014-09-15 ENCOUNTER — Other Ambulatory Visit: Payer: Self-pay | Admitting: Cardiovascular Disease

## 2014-09-15 NOTE — Telephone Encounter (Signed)
Rx refill sent to patient pharmacy   

## 2014-09-16 ENCOUNTER — Ambulatory Visit (INDEPENDENT_AMBULATORY_CARE_PROVIDER_SITE_OTHER): Payer: Medicare Other | Admitting: Cardiovascular Disease

## 2014-09-16 ENCOUNTER — Encounter: Payer: Self-pay | Admitting: Cardiovascular Disease

## 2014-09-16 VITALS — BP 142/70 | HR 49 | Ht 66.5 in | Wt 141.0 lb

## 2014-09-16 DIAGNOSIS — I2510 Atherosclerotic heart disease of native coronary artery without angina pectoris: Secondary | ICD-10-CM

## 2014-09-16 DIAGNOSIS — I739 Peripheral vascular disease, unspecified: Secondary | ICD-10-CM | POA: Diagnosis not present

## 2014-09-16 DIAGNOSIS — I10 Essential (primary) hypertension: Secondary | ICD-10-CM | POA: Diagnosis not present

## 2014-09-16 DIAGNOSIS — I779 Disorder of arteries and arterioles, unspecified: Secondary | ICD-10-CM

## 2014-09-16 NOTE — Assessment & Plan Note (Signed)
History of peripheral vascular disease status post left to right femorofemoral crossover grafting by Dr. Drucie Opitz May 2000.he has a known occluded right common iliac artery. He was complaining of left leg; occasion with Dopplers that were performed 01/26/14 showing a right ABI 0.87 and left of 0.82 with SFA disease. I initially and gram him 03/17/14 revealing high-grade disease in the proximal and mid SFA. On 05/23/14 I revascularized him by directly puncturing the femorofemoral crossover graft, performing diamondback orbital rotation atherectomy of the mid left SFA and stenting of the proximal left SFA. His follow-up Dopplers performed soon thereafter revealed an increase in his left ABI from 0.8-1. His claudication resolved.

## 2014-09-16 NOTE — Patient Instructions (Signed)
We request that you follow-up in: 6 months with an extender and in 1 year with Dr Berry  You will receive a reminder letter in the mail two months in advance. If you don't receive a letter, please call our office to schedule the follow-up appointment.   

## 2014-09-16 NOTE — Assessment & Plan Note (Signed)
History of carotid artery disease status post right carotid endarterectomy performed by Dr. Deon Castillo in 2006.carotid Dopplers suggested moderately high-grade disease in the right carotid bulb and angiography performed 03/17/14 revealed a 50% hypodense lesion at that site with a 50% proximal left common carotid artery stenosis at its origin. These blockages did not appear to be hemodynamically significant. We will continued duplex surveillance.

## 2014-09-16 NOTE — Progress Notes (Signed)
09/16/2014 Earl Castillo   05-22-1941  [id number]  Primary Physician [name], MD Primary Cardiologist: [name] MD [name]   HPI:  The patient is a 73 year old white male previously followed by [name] and now followed by myself. The last time I saw him was at the time of his intervention in July. His past medical history is significant for CAD, status post coronary artery bypass grafting in 2004 by [name], with a LIMA to his LAD, RIMA to the circumflex marginal and SVG to the distal RCA. He had a Myoview stress test performed 08/23/2012 which was low risk and nonischemic. He also has hypertension and hyperlipidemia. He has a 60+ year history of tobacco use and continues to smoke daily. He has significant peripheral vascular disease. He is status post right carotid endarterectomy performed by [name] in 2006. We have been following his carotid disease yearly by duplex ultrasound. He has also had a left to right fem-fem crossover graft by [name] in May of 2000. He was recently seen by [name] and complained of worsening claudication. Recent lower extremity arterial Doppler studies demonstrated progression of disease in his left common and external iliac arteries. In addition, it was suggested that he had moderately severe distal left SFA disease. Subsequently, on 03/17/2014 he underwent a diagnostic PV angiogram performed by [name]. His fem-fem crossover graft was widely patent. His left iliac also appeared widely patent, suggesting falsely elevated velocities by duplex. He did have a high grade focal calcified mid to distal left SFA stenosis corresponding to an area of abnormality and high velocity in the duplex ultrasound with three-vessel runoff. I performed diamondback orbital rotational atherectomy of his mid left SFA through his femorofemoral crossover graft 05/23/14 with stenting of his proximal left SFA as well. His subsequent Dopplers  revealed an increase in his left ABI from 0.8-1.0 . His claudication has resolved.   Current Outpatient Prescriptions  Medication Sig Dispense Refill  . acetaminophen (TYLENOL) 500 MG tablet Take 1,000 mg by mouth daily as needed (for leg pain).     Marland Kitchen aspirin EC 81 MG tablet Take 81 mg by mouth daily.    . clopidogrel (PLAVIX) 75 MG tablet Take 1 tablet (75 mg total) by mouth daily. 30 tablet 11  . cyanocobalamin (,VITAMIN B-12,) 1000 MCG/ML injection Inject 1,000 mcg into the muscle every 30 (thirty) days. Last injection 02/25/14    . docusate sodium (COLACE) 100 MG capsule Take 100 mg by mouth daily as needed (for constipation).     . ferrous gluconate (FERGON) 324 MG tablet Take 1 tablet (324 mg total) by mouth daily with breakfast. 30 tablet 2  . losartan (COZAAR) 100 MG tablet Take 1 tablet (100 mg total) by mouth daily. 30 tablet 5  . metoprolol (LOPRESSOR) 50 MG tablet Take 25 mg by mouth daily.    Marland Kitchen neomycin-colistin-hydrocortisone-thonzonium (CORTISPORIN TC) 3.01-04-09-0.5 MG/ML otic suspension Place 4 drops into the left ear 4 (four) times daily as needed (for itching). 10 mL 0  . NIFEdipine (PROCARDIA XL/ADALAT-CC) 90 MG 24 hr tablet Take 90 mg by mouth daily.    Marland Kitchen NIFEdipine (PROCARDIA XL/ADALAT-CC) 90 MG 24 hr tablet TAKE 1 TABLET BY MOUTH EVERY DAY 90 tablet 1  . simvastatin (ZOCOR) 20 MG tablet Take 20 mg by mouth daily after supper.     No current facility-administered medications for this visit.    No Known Allergies  History   Social  History  . Marital Status: Married    Spouse Name: N/A    Number of Children: 2  . Years of Education: N/A   Occupational History  .  Semi Retired Geophysicist/field seismologist    Social History Main Topics  . Smoking status: Current Every Day Smoker -- 0.25 packs/day for 58 years    Types: Cigarettes  . Smokeless tobacco: Never Used  . Alcohol Use: No  . Drug Use: No  . Sexual Activity: Not Currently   Other Topics Concern  . Not on file   Social  History Narrative   Daily caffeine      Review of Systems: General: negative for chills, fever, night sweats or weight changes.  Cardiovascular: negative for chest pain, dyspnea on exertion, edema, orthopnea, palpitations, paroxysmal nocturnal dyspnea or shortness of breath Dermatological: negative for rash Respiratory: negative for cough or wheezing Urologic: negative for hematuria Abdominal: negative for nausea, vomiting, diarrhea, bright red blood per rectum, melena, or hematemesis Neurologic: negative for visual changes, syncope, or dizziness All other systems reviewed and are otherwise negative except as noted above.    Blood pressure 142/70, pulse 49, height 5' 6.5" (1.689 m), weight 141 lb (63.957 kg).  General appearance: alert and no distress Neck: no adenopathy, no JVD, supple, symmetrical, trachea midline, thyroid not enlarged, symmetric, no tenderness/mass/nodules and bilateral carotid bruits right louder than left Lungs: clear to auscultation bilaterally Heart: regular rate and rhythm, S1, S2 normal, no murmur, click, rub or gallop Extremities: extremities normal, atraumatic, no cyanosis or edema and 2+ femoral pulses with a fairly loud left femoral bruit  EKG sinus bradycardia 49 with evidence of LVH. I personally reviewed this EKG  ASSESSMENT AND PLAN:   Coronary atherosclerosis History of CAD status post coronary artery bypass grafting in 2004 by [name] with a LIMA to the LAD, RIMA to the circumflex Marginal branch and a vein to the distal right coronary artery. The left Myoview stress test performed 08/23/12 was nonischemic. He denies chest pain or shortness of breath  PVD- Lt SFA PTA/stent 05/23/14 History of peripheral vascular disease status post left to right femorofemoral crossover grafting by Dr. Drucie Opitz May 2000.he has a known occluded right common iliac artery. He was complaining of left leg; occasion with Dopplers that were performed 01/26/14 showing a  right ABI 0.87 and left of 0.82 with SFA disease. I initially and gram him 03/17/14 revealing high-grade disease in the proximal and mid SFA. On 05/23/14 I revascularized him by directly puncturing the femorofemoral crossover graft, performing diamondback orbital rotation atherectomy of the mid left SFA and stenting of the proximal left SFA. His follow-up Dopplers performed soon thereafter revealed an increase in his left ABI from 0.8-1. His claudication resolved.  Essential hypertension History of hypertension with blood pressure measured today 142/70. He is on losartan and metoprolol and Procardia. We will consider instituting his current medications at current doses  HYPERLIPIDEMIA History of hyperlipidemia on Zocor 20 mg a day. This is followed by his primary care physician  Carotid artery disease History of carotid artery disease status post right carotid endarterectomy performed by Dr. Deon Pilling in 2006.carotid Dopplers suggested moderately high-grade disease in the right carotid bulb and angiography performed 03/17/14 revealed a 50% hypodense lesion at that site with a 50% proximal left common carotid artery stenosis at its origin. These blockages did not appear to be hemodynamically significant. We will continued duplex surveillance.      [name] MD FACP,FACC,FAHA, [facility] 09/16/2014 11:52 AM

## 2014-09-16 NOTE — Assessment & Plan Note (Signed)
History of CAD status post coronary artery bypass grafting in 2004 by [name] with a LIMA to the LAD, RIMA to the circumflex Marginal branch and a vein to the distal right coronary artery. The left Myoview stress test performed 08/23/12 was nonischemic. He denies chest pain or shortness of breath

## 2014-09-16 NOTE — Assessment & Plan Note (Signed)
History of hypertension with blood pressure measured today 142/70. He is on losartan and metoprolol and Procardia. We will consider instituting his current medications at current doses

## 2014-09-16 NOTE — Assessment & Plan Note (Signed)
History of hyperlipidemia on Zocor 20 mg a day. This is followed by his primary care physician

## 2014-09-23 ENCOUNTER — Ambulatory Visit (INDEPENDENT_AMBULATORY_CARE_PROVIDER_SITE_OTHER): Payer: Medicare Other | Admitting: Family Medicine

## 2014-09-23 DIAGNOSIS — E538 Deficiency of other specified B group vitamins: Secondary | ICD-10-CM

## 2014-09-23 MED ORDER — CYANOCOBALAMIN 1000 MCG/ML IJ SOLN
1000.0000 ug | Freq: Once | INTRAMUSCULAR | Status: AC
  Administered 2014-09-23: 1000 ug via INTRAMUSCULAR

## 2014-10-10 ENCOUNTER — Other Ambulatory Visit: Payer: Self-pay

## 2014-10-10 MED ORDER — METOPROLOL TARTRATE 50 MG PO TABS: 25.0000 mg | ORAL_TABLET | Freq: Every day | ORAL | Status: DC

## 2014-10-10 NOTE — Telephone Encounter (Signed)
Rx sent to pharmacy   

## 2014-10-13 ENCOUNTER — Encounter (HOSPITAL_COMMUNITY): Payer: Self-pay | Admitting: Cardiovascular Disease

## 2014-10-21 ENCOUNTER — Ambulatory Visit (INDEPENDENT_AMBULATORY_CARE_PROVIDER_SITE_OTHER): Payer: Medicare Other | Admitting: Family Medicine

## 2014-10-21 DIAGNOSIS — E538 Deficiency of other specified B group vitamins: Secondary | ICD-10-CM | POA: Diagnosis not present

## 2014-10-21 MED ORDER — CYANOCOBALAMIN 1000 MCG/ML IJ SOLN
1000.0000 ug | Freq: Once | INTRAMUSCULAR | Status: AC
  Administered 2014-10-21: 1000 ug via INTRAMUSCULAR

## 2014-10-28 ENCOUNTER — Other Ambulatory Visit: Payer: Self-pay | Admitting: Internal Medicine

## 2014-11-28 ENCOUNTER — Ambulatory Visit (INDEPENDENT_AMBULATORY_CARE_PROVIDER_SITE_OTHER): Payer: Medicare Other | Admitting: Family Medicine

## 2014-11-28 ENCOUNTER — Encounter: Payer: Self-pay | Admitting: Family Medicine

## 2014-11-28 VITALS — BP 164/65 | HR 53 | Temp 98.2°F | Ht 66.5 in | Wt 140.0 lb

## 2014-11-28 DIAGNOSIS — E538 Deficiency of other specified B group vitamins: Secondary | ICD-10-CM | POA: Diagnosis not present

## 2014-11-28 DIAGNOSIS — J0100 Acute maxillary sinusitis, unspecified: Secondary | ICD-10-CM

## 2014-11-28 MED ORDER — AMOXICILLIN-POT CLAVULANATE 875-125 MG PO TABS: 1.0000 | ORAL_TABLET | Freq: Two times a day (BID) | ORAL | Status: DC

## 2014-11-28 MED ORDER — CYANOCOBALAMIN 1000 MCG/ML IJ SOLN
1000.0000 ug | Freq: Once | INTRAMUSCULAR | Status: AC
  Administered 2014-11-28: 1000 ug via INTRAMUSCULAR

## 2014-11-28 NOTE — Progress Notes (Signed)
Pre visit review using our clinic review tool, if applicable. No additional management support is needed unless otherwise documented below in the visit note. 

## 2014-11-28 NOTE — Progress Notes (Signed)
   Subjective:    Patient ID: Earl Castillo, male    DOB: [birth year], 74 y.o.   MRN: [id number]  HPI Here for 2 weeks of sinus pressure, HA, PND, and coughing up yellow sputum.    Review of Systems  Constitutional: Negative.   HENT: Positive for congestion, postnasal drip and sinus pressure.   Eyes: Negative.   Respiratory: Positive for cough.        Objective:   Physical Exam  Constitutional: He appears well-developed and well-nourished.  HENT:  Right Ear: External ear normal.  Left Ear: External ear normal.  Nose: Nose normal.  Mouth/Throat: Oropharynx is clear and moist.  Eyes: Conjunctivae are normal.  Pulmonary/Chest: Effort normal and breath sounds normal.  Lymphadenopathy:    He has no cervical adenopathy.          Assessment & Plan:  Add Mucinex

## 2014-11-28 NOTE — Addendum Note (Signed)
Addended by: Aggie Hacker A on: 11/28/2014 05:25 PM   Modules accepted: Orders

## 2014-12-02 ENCOUNTER — Ambulatory Visit: Payer: Medicare Other | Admitting: Family Medicine

## 2014-12-08 ENCOUNTER — Other Ambulatory Visit (HOSPITAL_COMMUNITY): Payer: Self-pay | Admitting: Cardiovascular Disease

## 2014-12-08 ENCOUNTER — Other Ambulatory Visit: Payer: Self-pay | Admitting: Family Medicine

## 2014-12-08 DIAGNOSIS — I739 Peripheral vascular disease, unspecified: Secondary | ICD-10-CM

## 2014-12-09 NOTE — Telephone Encounter (Signed)
I spoke with pt and he is still having sinus type symptoms- coughing up mucus. Per Dr. Sarajane Jews okay to refill for 10 day supply and I did send script e-scribe.

## 2014-12-20 ENCOUNTER — Other Ambulatory Visit: Payer: Self-pay | Admitting: Cardiovascular Disease

## 2014-12-21 NOTE — Telephone Encounter (Signed)
Rx(s) sent to pharmacy electronically.  

## 2014-12-30 ENCOUNTER — Ambulatory Visit (INDEPENDENT_AMBULATORY_CARE_PROVIDER_SITE_OTHER): Payer: Medicare Other | Admitting: Family Medicine

## 2014-12-30 ENCOUNTER — Ambulatory Visit (HOSPITAL_COMMUNITY)
Admission: RE | Admit: 2014-12-30 | Discharge: 2014-12-30 | Disposition: A | Discharge status: Home / Self Care | Payer: Medicare Other | Source: Ambulatory Visit | Attending: Cardiology | Admitting: Cardiology

## 2014-12-30 DIAGNOSIS — I70202 Unspecified atherosclerosis of native arteries of extremities, left leg: Secondary | ICD-10-CM | POA: Diagnosis not present

## 2014-12-30 DIAGNOSIS — E538 Deficiency of other specified B group vitamins: Secondary | ICD-10-CM

## 2014-12-30 DIAGNOSIS — I739 Peripheral vascular disease, unspecified: Secondary | ICD-10-CM

## 2014-12-30 DIAGNOSIS — Z9889 Other specified postprocedural states: Secondary | ICD-10-CM | POA: Diagnosis not present

## 2014-12-30 MED ORDER — CYANOCOBALAMIN 1000 MCG/ML IJ SOLN
1000.0000 ug | Freq: Once | INTRAMUSCULAR | Status: AC
  Administered 2014-12-30: 1000 ug via INTRAMUSCULAR

## 2014-12-30 NOTE — Progress Notes (Signed)
Arterial Duplex Lower Ext. Completed. Shuayb Schepers, BS, RDMS, RVT  

## 2015-01-06 ENCOUNTER — Encounter: Payer: Self-pay | Admitting: Cardiovascular Disease

## 2015-01-06 ENCOUNTER — Ambulatory Visit (INDEPENDENT_AMBULATORY_CARE_PROVIDER_SITE_OTHER): Payer: Medicare Other | Admitting: Cardiovascular Disease

## 2015-01-06 VITALS — BP 188/64 | HR 64 | Ht 66.0 in | Wt 140.1 lb

## 2015-01-06 DIAGNOSIS — I739 Peripheral vascular disease, unspecified: Secondary | ICD-10-CM | POA: Diagnosis not present

## 2015-01-06 NOTE — Patient Instructions (Signed)
Your physician wants you to follow-up in 6 months with [name]. You will receive a reminder letter in the mail 2 months in advance. If you do not receive a letter, please call our office to schedule the follow-up appointment.  Lower extremity arterial doppler (in 3 months and then again in 3 more months) - During this test, ultrasound is used to evaluate arterial blood flow in the legs. Allow approximately one hour for this exam.

## 2015-01-06 NOTE — Assessment & Plan Note (Addendum)
Mr. Bache returns for evaluation of lower extremity arterial Doppler studies performed 12/30/14. Eye angiogram him 05/23/14 through his left or right femorofemoral crossover graft and perform diamondback atherectomy of his proximal and mid left SFA. I then performed drug-coated balloon angioplasty of the mid left SFA and stenting of his proximal left SFA. His follow-up Dopplers performed on 06/07/14 revealed ABI of 1 was widely patent sites of intervention however his most recent Dopplers showed a decline in his ABI to the 0.6 range with a high-frequency signal in his mid left SFA. He denies claudication. I'm going to repeat his Dopplers again in 3 and 6 months and we'll see him back after that. Should he have return of symptoms he would be candidate for repeat intervention.

## 2015-01-06 NOTE — Progress Notes (Signed)
[name] returns today for follow-up of his lower extremity arterial Doppler studies performed on 12/30/14. This revealed a decline in his left ABI from 1-0.6 over the last 6 months since his intervention done on 05/23/14. He denies claudication. And then repeat his Dopplers again in 3 and 6 months and see back after that for further evaluation. Should he develop recurrent symptoms any time before his next office visit he will be candidate for re-intervention.   [name], M.D., [place], [facility], [place] [address] [place], [place]  [address]  [phone] 01/06/2015 9:02 AM

## 2015-01-10 DIAGNOSIS — L821 Other seborrheic keratosis: Secondary | ICD-10-CM | POA: Diagnosis not present

## 2015-01-10 DIAGNOSIS — L570 Actinic keratosis: Secondary | ICD-10-CM | POA: Diagnosis not present

## 2015-01-26 ENCOUNTER — Ambulatory Visit (INDEPENDENT_AMBULATORY_CARE_PROVIDER_SITE_OTHER): Payer: Medicare Other | Admitting: Family Medicine

## 2015-01-26 ENCOUNTER — Other Ambulatory Visit: Payer: Self-pay | Admitting: Internal Medicine

## 2015-01-26 DIAGNOSIS — E538 Deficiency of other specified B group vitamins: Secondary | ICD-10-CM

## 2015-01-26 MED ORDER — CYANOCOBALAMIN 1000 MCG/ML IJ SOLN
1000.0000 ug | Freq: Once | INTRAMUSCULAR | Status: AC
  Administered 2015-01-26: 1000 ug via INTRAMUSCULAR

## 2015-02-13 ENCOUNTER — Telehealth (HOSPITAL_COMMUNITY): Payer: Self-pay | Admitting: *Deleted

## 2015-02-13 NOTE — Telephone Encounter (Signed)
Pt called wanting to schedule LEA in May when he sees Mickel Baas. The recall says June but the patient says [name] wants him to have it 3 months after the last one because is results were bad. Please advise on when the patient should come in

## 2015-02-14 NOTE — Telephone Encounter (Signed)
May is fine.  He had them done in February so May would be three months.  Thanks!

## 2015-02-15 ENCOUNTER — Telehealth (HOSPITAL_COMMUNITY): Payer: Self-pay | Admitting: *Deleted

## 2015-03-03 ENCOUNTER — Ambulatory Visit (INDEPENDENT_AMBULATORY_CARE_PROVIDER_SITE_OTHER): Payer: Medicare Other | Admitting: Family Medicine

## 2015-03-03 DIAGNOSIS — E538 Deficiency of other specified B group vitamins: Secondary | ICD-10-CM | POA: Diagnosis not present

## 2015-03-03 MED ORDER — CYANOCOBALAMIN 1000 MCG/ML IJ SOLN
1000.0000 ug | Freq: Once | INTRAMUSCULAR | Status: AC
  Administered 2015-03-03: 1000 ug via INTRAMUSCULAR

## 2015-03-11 ENCOUNTER — Other Ambulatory Visit: Payer: Self-pay | Admitting: Cardiovascular Disease

## 2015-03-13 NOTE — Telephone Encounter (Signed)
Rx refill sent to patient pharmacy   

## 2015-03-14 ENCOUNTER — Other Ambulatory Visit: Payer: Self-pay | Admitting: Cardiovascular Disease

## 2015-03-14 NOTE — Telephone Encounter (Signed)
Rx(s) sent to pharmacy electronically.  

## 2015-03-16 ENCOUNTER — Ambulatory Visit (INDEPENDENT_AMBULATORY_CARE_PROVIDER_SITE_OTHER): Payer: Medicare Other | Admitting: Family Medicine

## 2015-03-16 ENCOUNTER — Encounter: Payer: Self-pay | Admitting: Family Medicine

## 2015-03-16 VITALS — BP 150/78 | HR 63 | Temp 98.4°F | Ht 66.0 in | Wt 141.3 lb

## 2015-03-16 DIAGNOSIS — J019 Acute sinusitis, unspecified: Secondary | ICD-10-CM | POA: Diagnosis not present

## 2015-03-16 MED ORDER — AMOXICILLIN-POT CLAVULANATE 875-125 MG PO TABS: 1.0000 | ORAL_TABLET | Freq: Two times a day (BID) | ORAL | Status: DC

## 2015-03-16 NOTE — Progress Notes (Signed)
HPI:  URI: -started: a little over 1 week ago -symptoms:nasal congestion, sore throat, cough, PND, some frontal pain -denies:fever, SOB, NVD, tooth pain, nasal or maxillary pain, white thick nasal mucus -has tried: musinex, tylenol -sick contacts/travel/risks: denies flu exposure, tick exposure or or Ebola risks  ROS: See pertinent positives and negatives per HPI.  Past Medical History  Diagnosis Date  . Hyperlipidemia   . Hypertension   . Chicken pox   . Coronary artery disease     CABG'04, Low risk Myoview 2013  . Skin exam, screening for cancer     sees [name]   . Hemorrhoid   . GERD (gastroesophageal reflux disease)   . Colon polyps 08/2012    Hyperplastic and adenomatous sessile polyps  . Carotid artery disease     status post carotid endarterectomy by [name]  . Peripheral arterial disease     status post left to right fem-fem crossover graft in by [name]  . Angiodysplasia of intestine - small bowel  09/21/2013  . Vitamin B12 deficiency 11/23/2013  . Nephrolithiasis     "passed them most of the time"  . Anemia   . 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 Sinus headache     "alot" (05/23/2014)  . Osteoarthritis     "hips" (05/23/2014)  . Lower GI bleeding 08/2013  . Blood transfusion abn reaction or complication, no procedure mishap     "HgB dropped real low"  . History of blood transfusion 08/2013    "HgB dropped real low; related to bleeding from my rectum"    Past Surgical History  Procedure Laterality Date  . Cervical disc surgery  1978    "bulging disc"  . Bypass rt leg Right 2000  . Carotid endarterectomy Right   . Basket extraction of ureteral stone  1970's  . Basal cell carcinoma excision  May 2009    per [name] , left nose   . Colonoscopy  08-12-12    per [name], tubular adenomas, repeat in 5 yrs   . Cataract extraction w/ intraocular lens  implant,  bilateral Bilateral 2014    per [name]  . Esophagogastroduodenoscopy N/A 08/17/2013    Procedure: ESOPHAGOGASTRODUODENOSCOPY (EGD);  Surgeon: [name], MD;  Location: [facility] ENDOSCOPY;  Service: Endoscopy;  Laterality: N/A;  . Cardiac catheterization  08/18/2003    Recommend CABG  . Carotid endarterectomy Right 03/26/2005    Dr Georgina Quint  . Cardiovascular stress test - lexiscan  08/25/2012    No significant wall motion abnormalities  . Transthoracic echocardiogram  08/02/2013    EF 55-60%, normal echocardiogram  . Renal duplex Bilateral 02/02/2013    Bilateral renal arteries demonstrate narrowingconsistent with a 60-99% diameter reduction.  . Capsule endoscopy  10-05-13    per [name], 4 AVM's  . Femoral artery stent Left 05/23/2014  . Band hemorrhoidectomy    . Femoral-femoral bypass graft  2003  . Coronary artery bypass graft  08/30/2003    x3. LIMA-distal LAD, RIMA-circumflex, SVG-distal RCA  . Cystoscopy w/ stone manipulation    . Lower extremity angiogram N/A 03/17/2014    Procedure: LOWER EXTREMITY ANGIOGRAM;  Surgeon: [name], MD;  Location: [facility] CATH LAB;  Service: Cardiovascular;  Laterality: N/A;  . Carotid angiogram N/A 03/17/2014    Procedure: CAROTID ANGIOGRAM;  Surgeon: [name], MD;  Location: [facility] CATH LAB;  Service: Cardiovascular;  Laterality: N/A;  right carotid    Family History  Problem Relation Age of Onset  . Colon cancer Neg Hx   . Heart disease Father   . Coronary artery disease Brother     CABG x 6  . Coronary artery disease Brother     first MI 17, died at 46 during cardiac surgery  . Coronary artery disease Sister     CABG x 5, RAS    History   Social History  . Marital Status: Married    Spouse Name: N/A  . Number of Children: 2  . Years of Education: N/A   Occupational History  .  Semi Retired Geophysicist/field seismologist    Social History Main Topics  . Smoking status: Current Some Day Smoker -- 58 years    Types: Cigarettes  .  Smokeless tobacco: Never Used     Comment: 3 cigarettes every couple of days  . Alcohol Use: No  . Drug Use: No  . Sexual Activity: Not Currently   Other Topics Concern  . None   Social History Narrative   Daily caffeine      Current outpatient prescriptions:  .  acetaminophen (TYLENOL) 500 MG tablet, Take 1,000 mg by mouth daily as needed (for leg pain). , Disp: , Rfl:  .  aspirin EC 81 MG tablet, Take 81 mg by mouth daily., Disp: , Rfl:  .  clopidogrel (PLAVIX) 75 MG tablet, Take 1 tablet (75 mg total) by mouth daily., Disp: 30 tablet, Rfl: 11 .  cyanocobalamin (,VITAMIN B-12,) 1000 MCG/ML injection, Inject 1,000 mcg into the muscle every 30 (thirty) days. Last injection 02/25/14, Disp: , Rfl:  .  docusate sodium (COLACE) 100 MG capsule, Take 100 mg by mouth daily as needed (for constipation). , Disp: , Rfl:  .  ferrous gluconate (FERGON) 324 MG tablet, TAKE 1 TABLET BY MOUTH EVERY DAY WITH BREAKFAST, Disp: 30 tablet, Rfl: 2 .  losartan (COZAAR) 100 MG tablet, TAKE 1 TABLET (100 MG TOTAL) BY MOUTH DAILY., Disp: 30 tablet, Rfl: 9 .  metoprolol (LOPRESSOR) 50 MG tablet, Take 0.5 tablets (25 mg total) by mouth daily., Disp: 90 tablet, Rfl: 4 .  neomycin-colistin-hydrocortisone-thonzonium (CORTISPORIN TC) 3.01-04-09-0.5 MG/ML otic suspension, Place 4 drops into the left ear 4 (four) times daily as needed (for itching)., Disp: 10 mL, Rfl: 0 .  NIFEdipine (PROCARDIA XL/ADALAT-CC) 90 MG 24 hr tablet, TAKE 1 TABLET BY MOUTH EVERY DAY, Disp: 90 tablet, Rfl: 2 .  simvastatin (ZOCOR) 20 MG tablet, TAKE 1 TABLET BY MOUTH EVERY EVENING AFTER SUPPER, Disp: 90 tablet, Rfl: 2 .  amoxicillin-clavulanate (AUGMENTIN) 875-125 MG per tablet, Take 1 tablet by mouth 2 (two) times daily., Disp: 14 tablet, Rfl: 0  EXAM:  Filed Vitals:   03/16/15 1453  BP: 150/78  Pulse: 63  Temp: 98.4 F (36.9 C)    Body mass index is 22.82 kg/(m^2).  GENERAL: vitals reviewed and listed above, alert, oriented, appears  well hydrated and in no acute distress  HEENT: atraumatic, conjunttiva clear, no obvious abnormalities on inspection of external nose and ears, normal appearance of ear canals and TMs, clear nasal congestion, mild post oropharyngeal erythema with PND, no tonsillar edema or exudate, no sinus TTP  NECK: no obvious masses on inspection  LUNGS: clear to auscultation bilaterally, no wheezes, rales or rhonchi, good air movement  CV: HRRR, no peripheral edema  MS: moves all extremities without noticeable abnormality  PSYCH: pleasant and cooperative, no obvious depression or anxiety  ASSESSMENT AND PLAN:  Discussed the following assessment and plan:  Acute sinusitis, recurrence not specified, unspecified location  - We discussed potential etiologies, treatment side effects, likely course, antibiotic misuse, transmission, and signs of developing a serious illness. -advised try supportive care, delayed abx as may be viral, but he opted for abx given some facial pain - risks discussed -of course, we advised to return or notify a doctor immediately if symptoms worsen or persist or new concerns arise.    Patient Instructions  INSTRUCTIONS FOR UPPER RESPIRATORY INFECTION:  -plenty of rest and fluids  -nasal saline wash 2-3 times daily (use prepackaged nasal saline or bottled/distilled water if making your own)   -can use AFRIN nasal spray for drainage and nasal congestion - but do NOT use longer then 3-4 days  -can use tylenol (in no history of liver disease) or ibuprofen (if no history of kidney disease, bowel bleeding or significant heart disease) as directed for aches and sorethroat  -in the winter time, using a humidifier at night is helpful (please follow cleaning instructions)  -if you are taking a cough medication - use only as directed, may also try a teaspoon of honey to coat the throat and throat lozenges. If given a cough medication with codeine or hydrocodone or other narcotic  please be advised that this contains a strong and  potentially addicting medication. Please follow instructions carefully, take as little as possible and only use AS NEEDED for severe cough. Discuss potential side effects with your pharmacy. Please do not drive or operate machinery while taking these types of medications. Please do not take other sedating medications, drugs or alcohol while taking this medication without discussing with your doctor.  -for sore throat, salt water gargles can help  -follow up if you have fevers, facial pain, tooth pain, difficulty breathing or are worsening or symptoms persist longer then expected  Upper Respiratory Infection, Adult An upper respiratory infection (URI) is also known as the common cold. It is often caused by a type of germ (virus). Colds are easily spread (contagious). You can pass it to others by kissing, coughing, sneezing, or drinking out of the same glass. Usually, you get better in 1 to 3  weeks.  However, the cough can last for even longer. HOME CARE   Only take medicine as told by your doctor. Follow instructions provided above.  Drink enough water and fluids to keep your pee (urine) clear or pale yellow.  Get plenty of rest.  Return to work when your temperature is < 100 for 24 hours or as told by your doctor. You may use a face mask and wash your hands to stop your cold from spreading. GET HELP RIGHT AWAY IF:   After the first few days, you feel you are getting worse.  You have questions about your medicine.  You have chills, shortness of breath, or red spit (mucus).  You have pain in the face for more then 1-2 days, especially when you bend forward.  You have a fever, puffy (swollen) neck, pain when you swallow, or white spots in the back of your throat.  You have a bad headache, ear pain, sinus pain, or chest pain.  You have a high-pitched whistling sound when you breathe in and out (wheezing).  You cough up blood.  You  have sore muscles or a stiff neck. MAKE SURE YOU:   Understand these instructions.  Will watch your condition.  Will get help right away if you are not doing well or  get worse. Document Released: 04/08/2008 Document Revised: 01/13/2012 Document Reviewed: 01/26/2014 [facility] Patient Information 2015 [place], [place]. This information is not intended to replace advice given to you by your health care provider. Make sure you discuss any questions you have with your health care provider.      Earl Benton R.

## 2015-03-16 NOTE — Patient Instructions (Signed)

## 2015-03-16 NOTE — Progress Notes (Signed)
Pre visit review using our clinic review tool, if applicable. No additional management support is needed unless otherwise documented below in the visit note. 

## 2015-03-20 ENCOUNTER — Ambulatory Visit (INDEPENDENT_AMBULATORY_CARE_PROVIDER_SITE_OTHER): Payer: Medicare Other | Admitting: Cardiology

## 2015-03-20 ENCOUNTER — Encounter: Payer: Self-pay | Admitting: Cardiology

## 2015-03-20 VITALS — BP 128/64 | HR 48 | Ht 66.0 in | Wt 138.5 lb

## 2015-03-20 DIAGNOSIS — E785 Hyperlipidemia, unspecified: Secondary | ICD-10-CM

## 2015-03-20 DIAGNOSIS — Z79899 Other long term (current) drug therapy: Secondary | ICD-10-CM | POA: Diagnosis not present

## 2015-03-20 DIAGNOSIS — I2510 Atherosclerotic heart disease of native coronary artery without angina pectoris: Secondary | ICD-10-CM

## 2015-03-20 DIAGNOSIS — R001 Bradycardia, unspecified: Secondary | ICD-10-CM

## 2015-03-20 DIAGNOSIS — I739 Peripheral vascular disease, unspecified: Secondary | ICD-10-CM

## 2015-03-20 NOTE — Progress Notes (Signed)
Cardiology Office Note   Date:  03/20/2015   ID:  Earl Castillo, DOB May 02, 1941, MRN [id number]  PCP:  [name], MD  Cardiologist:  [name]    Chief Complaint  Patient presents with  . ROV 6 months    patient reports shortness of breath r/t sinus infection, otherwise, no cardiac complaints.      History of Present Illness: Earl Castillo is a 74 y.o. male who presents for follow up of CAD and PAD.   He is having increased claudication of Lt leg.  He can only walk 2 blocks without having to rest.  Recent sinus infection-completing antibiotics.  He denies chest pain.  Has not smoked since last Thursday and does not plan to resume. Was smoking 1 pk every 2-3 days.   Congratulated.  He is not sure who checks his cholesterol.  His lower extremity arterial Doppler studies performed on 12/30/14 revealed a decline in his left ABI from 1-0.6 over the last 6 months since his intervention done on 05/23/14. He now has Lt leg claudication.   [name] felt if he should he develop recurrent symptoms any time before his next office visit he will be candidate for re-intervention.   History includes CAD, status post coronary artery bypass grafting in 2004 by [name], with a LIMA to his LAD, RIMA to the circumflex marginal and SVG to the distal RCA. He had a Myoview stress test performed 08/23/2012 which was low risk and nonischemic. He also has hypertension and hyperlipidemia. He has a 60+ year history of tobacco use and continues to smoke daily. He has significant peripheral vascular disease. He is status post right carotid endarterectomy performed by [name] in 2006. We have been following his carotid disease yearly by duplex ultrasound. He has also had a left to right fem-fem crossover graft by Dr. Drucie Opitz in May of 2000. He was recently seen by [name] and complained of worsening claudication. Lower extremity arterial Doppler studies demonstrated progression of disease in his left  common and external iliac arteries. In addition, it was suggested that he had moderately severe distal left SFA disease. Subsequently, on 03/17/2014 he underwent a diagnostic PV angiogram performed by [name]. His fem-fem crossover graft was widely patent. His left iliac also appeared widely patent, suggesting falsely elevated velocities by duplex. He did have a high grade focal calcified mid to distal left SFA stenosis corresponding to an area of abnormality and high velocity in the duplex ultrasound with three-vessel runoff. Dr. Adora Fridge  performed diamondback orbital rotational atherectomy of his mid left SFA through his femorofemoral crossover graft 05/23/14 with stenting of his proximal left SFA as well. His subsequent Dopplers revealed an increase in his left ABI from 0.8-1.0 . His claudication had resolved until last month or so.   Past Medical History  Diagnosis Date  . Hyperlipidemia   . Hypertension   . Chicken pox   . Coronary artery disease     CABG'04, Low risk Myoview 2013  . Skin exam, screening for cancer     sees [name]   . Hemorrhoid   . GERD (gastroesophageal reflux disease)   . Colon polyps 08/2012    Hyperplastic and adenomatous sessile polyps  . Carotid artery disease     status post carotid endarterectomy by [name]  . Peripheral arterial disease     status post left to right fem-fem crossover graft in by [name]  .  Angiodysplasia of intestine - small bowel  09/21/2013  . Vitamin B12 deficiency 11/23/2013  . Nephrolithiasis     "passed them most of the time"  . Anemia   . 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 Sinus headache     "alot" (05/23/2014)  . Osteoarthritis     "hips" (05/23/2014)  . Lower GI bleeding 08/2013  . Blood transfusion abn reaction or complication, no procedure mishap     "HgB dropped real low"  . History of blood transfusion 08/2013    "HgB dropped real low;  related to bleeding from my rectum"    Past Surgical History  Procedure Laterality Date  . Cervical disc surgery  1978    "bulging disc"  . Bypass rt leg Right 2000  . Carotid endarterectomy Right   . Basket extraction of ureteral stone  1970's  . Basal cell carcinoma excision  May 2009    per [name] , left nose   . Colonoscopy  08-12-12    per [name], tubular adenomas, repeat in 5 yrs   . Cataract extraction w/ intraocular lens  implant, bilateral Bilateral 2014    per [name]  . Esophagogastroduodenoscopy N/A 08/17/2013    Procedure: ESOPHAGOGASTRODUODENOSCOPY (EGD);  Surgeon: [name], MD;  Location: [facility] ENDOSCOPY;  Service: Endoscopy;  Laterality: N/A;  . Cardiac catheterization  08/18/2003    Recommend CABG  . Carotid endarterectomy Right 03/26/2005    Dr Georgina Quint  . Cardiovascular stress test - lexiscan  08/25/2012    No significant wall motion abnormalities  . Transthoracic echocardiogram  08/02/2013    EF 55-60%, normal echocardiogram  . Renal duplex Bilateral 02/02/2013    Bilateral renal arteries demonstrate narrowingconsistent with a 60-99% diameter reduction.  . Capsule endoscopy  10-05-13    per [name], 4 AVM's  . Femoral artery stent Left 05/23/2014  . Band hemorrhoidectomy    . Femoral-femoral bypass graft  2003  . Coronary artery bypass graft  08/30/2003    x3. LIMA-distal LAD, RIMA-circumflex, SVG-distal RCA  . Cystoscopy w/ stone manipulation    . Lower extremity angiogram N/A 03/17/2014    Procedure: LOWER EXTREMITY ANGIOGRAM;  Surgeon: [name], MD;  Location: [facility] CATH LAB;  Service: Cardiovascular;  Laterality: N/A;  . Carotid angiogram N/A 03/17/2014    Procedure: CAROTID ANGIOGRAM;  Surgeon: [name], MD;  Location: [facility] CATH LAB;  Service: Cardiovascular;  Laterality: N/A;  right carotid     Current Outpatient Prescriptions  Medication Sig Dispense Refill  . acetaminophen (TYLENOL) 500 MG tablet Take 1,000 mg by  mouth daily as needed (for leg pain).     Marland Kitchen amoxicillin-clavulanate (AUGMENTIN) 875-125 MG per tablet Take 1 tablet by mouth 2 (two) times daily. 14 tablet 0  . aspirin EC 81 MG tablet Take 81 mg by mouth daily.    . clopidogrel (PLAVIX) 75 MG tablet Take 1 tablet (75 mg total) by mouth daily. 30 tablet 11  . cyanocobalamin (,VITAMIN B-12,) 1000 MCG/ML injection Inject 1,000 mcg into the muscle every 30 (thirty) days. Last injection 02/25/14    . docusate sodium (COLACE) 100 MG capsule Take 100 mg by mouth daily as needed (for constipation).     . ferrous gluconate (FERGON) 324 MG tablet TAKE 1 TABLET BY MOUTH EVERY DAY WITH BREAKFAST 30 tablet 2  . losartan (COZAAR) 100 MG tablet TAKE 1 TABLET (100 MG TOTAL) BY MOUTH DAILY. [place]  tablet 9  . metoprolol (LOPRESSOR) 50 MG tablet Take 0.5 tablets (25 mg total) by mouth daily. 90 tablet 4  . neomycin-colistin-hydrocortisone-thonzonium (CORTISPORIN TC) 3.01-04-09-0.5 MG/ML otic suspension Place 4 drops into the left ear 4 (four) times daily as needed (for itching). 10 mL 0  . NIFEdipine (PROCARDIA XL/ADALAT-CC) 90 MG 24 hr tablet TAKE 1 TABLET BY MOUTH EVERY DAY 90 tablet 2  . simvastatin (ZOCOR) 20 MG tablet TAKE 1 TABLET BY MOUTH EVERY EVENING AFTER SUPPER 90 tablet 2   No current facility-administered medications for this visit.    Allergies:   Review of patient's allergies indicates no known allergies.    Social History:  The patient  reports that he has been smoking Cigarettes.  He has smoked for the past 58 years. He has never used smokeless tobacco. He reports that he does not drink alcohol or use illicit drugs.   Family History:  The patient's family history includes Coronary artery disease in his brother, brother, and sister; Heart disease in his father. There is no history of Colon cancer.    ROS:  General:no colds or fevers, no weight changes Skin:no rashes or ulcers HEENT:no blurred vision, no congestion CV:see HPI  No chest  pain PUL:see HPI no SOB GI:no diarrhea constipation or melena, no indigestion GU:no hematuria, no dysuria MS:no joint pain, + claudication Lt leg Neuro:no syncope, no lightheadedness Endo:no diabetes, no thyroid disease  Wt Readings from Last 3 Encounters:  03/20/15 138 lb 8 oz (62.823 kg)  03/16/15 141 lb 4.8 oz (64.093 kg)  01/06/15 140 lb 1.6 oz (63.549 kg)     PHYSICAL EXAM: VS:  BP 128/64 mmHg  Pulse 48  Ht 5\' 6"  (1.676 m)  Wt 138 lb 8 oz (62.823 kg)  BMI 22.37 kg/m2 , BMI Body mass index is 22.37 kg/(m^2). General:Pleasant affect, NAD Skin:Warm and dry, brisk capillary refill HEENT:normocephalic, sclera clear, mucus membranes moist Neck:supple, no JVD, no bruits  Heart:S1S2 RRR without murmur, gallup, rub or click Lungs:clear without rales, rhonchi, or wheezes JME:QAST, non tender, + BS, do not palpate liver spleen or masses Ext:no lower ext edema, 2+ pedal pulses, 2+ radial pulses Neuro:alert and oriented, MAE, follows commands, + facial symmetry    EKG:  EKG is ordered today. The ekg ordered today demonstrates SB at 48, +LVH, otherwise no acute changes.     Recent Labs: 04/26/2014: ALT 8; TSH 1.152 06/22/2014: BUN 15; Creatinine 0.86; Hemoglobin 14.0; Platelets 167; Potassium 4.0; Sodium 141    Lipid Panel    Component Value Date/Time   CHOL 120 12/03/2013 0816   TRIG 66 12/03/2013 0816   HDL 51 12/03/2013 0816   CHOLHDL 2.4 12/03/2013 0816   VLDL 13 12/03/2013 0816   LDLCALC 56 12/03/2013 0816       Other studies Reviewed: Additional studies/ records that were reviewed today include: previous OV, labs, dopplers..   ASSESSMENT AND PLAN:  Coronary atherosclerosis History of CAD status post coronary artery bypass grafting in 2004 by [name] with a LIMA to the LAD, RIMA to the circumflex Marginal branch and a vein to the distal right coronary artery. The left Myoview stress test performed 08/23/12 was nonischemic. He denies chest pain or shortness of  breath  PVD- Lt SFA PTA/stent 05/23/14 History of peripheral vascular disease status post left to right femorofemoral crossover grafting by Dr. Drucie Opitz May 2000.he has a known occluded right common iliac artery. He was complaining of left leg; occasion with Dopplers that were performed  01/26/14 showing a right ABI 0.87 and left of 0.82 with SFA disease. I initially and gram him 03/17/14 revealing high-grade disease in the proximal and mid SFA. On 05/23/14 I revascularized him by directly puncturing the femorofemoral crossover graft, performing diamondback orbital rotation atherectomy of the mid left SFA and stenting of the proximal left SFA. His follow-up Dopplers performed soon thereafter revealed an increase in his left ABI from 0.8-1. His claudication resolved.   Now returned in Lt leg, will do dopplers and plan for PCI if abnormal.  Follow up dependent on dopplers.  Essential hypertension History of hypertension with blood pressure measured today 142/70. He is on losartan and metoprolol and Procardia. Continue current meds.  HYPERLIPIDEMIA History of hyperlipidemia on Zocor 20 mg a day. Recheck lipids  Carotid artery disease History of carotid artery disease status post right carotid endarterectomy performed by Dr. Deon Pilling in 2006.carotid Dopplers suggested moderately high-grade disease in the right carotid bulb and angiography performed 03/17/14 revealed a 50% hypodense lesion at that site with a 50% proximal left common carotid artery stenosis at its origin. These blockages did not appear to be hemodynamically significant. We will continued duplex surveillance.  Sinus Brady- HR 48, no acute changes.  We discussed decreasing BB, will monitor for now as he is asymptomatic.  Sinusitis treated.    Current medicines are reviewed with the patient today.  The patient Has no concerns regarding medicines.  The following changes have been made:  See above Labs/ tests ordered today include:see  above  Disposition:   FU:  see above  Lennie Muckle, NP  03/20/2015 10:30 AM    [facility] [place], [place], [address] [place], [place] Phone: [phone]; Fax: [phone]

## 2015-03-20 NOTE — Patient Instructions (Signed)
Your physician recommends that you return for lab work next week - FASTING.  You will receive a phone call with the results of your ultrasound.  Please call our office if you experience increased fatigue or lightheadedness.

## 2015-03-21 ENCOUNTER — Encounter: Payer: Self-pay | Admitting: Cardiology

## 2015-03-27 DIAGNOSIS — Z79899 Other long term (current) drug therapy: Secondary | ICD-10-CM | POA: Diagnosis not present

## 2015-03-27 DIAGNOSIS — E785 Hyperlipidemia, unspecified: Secondary | ICD-10-CM | POA: Diagnosis not present

## 2015-03-27 LAB — COMPREHENSIVE METABOLIC PANEL
ALT: 10 U/L (ref 0–53)
AST: 16 U/L (ref 0–37)
Albumin: 3.6 g/dL (ref 3.5–5.2)
Alkaline Phosphatase: 66 U/L (ref 39–117)
BUN: 21 mg/dL (ref 6–23)
CO2: 28 mEq/L (ref 19–32)
Calcium: 9.1 mg/dL (ref 8.4–10.5)
Chloride: 105 mEq/L (ref 96–112)
Creat: 1.08 mg/dL (ref 0.50–1.35)
Glucose, Bld: 109 mg/dL — ABNORMAL HIGH (ref 70–99)
POTASSIUM: 4.5 meq/L (ref 3.5–5.3)
Sodium: 142 mEq/L (ref 135–145)
TOTAL PROTEIN: 6.8 g/dL (ref 6.0–8.3)
Total Bilirubin: 0.5 mg/dL (ref 0.2–1.2)

## 2015-03-27 LAB — LIPID PANEL
CHOL/HDL RATIO: 2.2 ratio
Cholesterol: 107 mg/dL (ref 0–200)
HDL: 49 mg/dL (ref >=40)
LDL Cholesterol: 46 mg/dL (ref 0–99)
Triglycerides: 62 mg/dL (ref <150)
VLDL: 12 mg/dL (ref 0–40)

## 2015-03-31 ENCOUNTER — Ambulatory Visit (INDEPENDENT_AMBULATORY_CARE_PROVIDER_SITE_OTHER): Payer: Medicare Other | Admitting: Family Medicine

## 2015-03-31 DIAGNOSIS — E538 Deficiency of other specified B group vitamins: Secondary | ICD-10-CM | POA: Diagnosis not present

## 2015-03-31 MED ORDER — CYANOCOBALAMIN 1000 MCG/ML IJ SOLN
1000.0000 ug | Freq: Once | INTRAMUSCULAR | Status: AC
  Administered 2015-03-31: 1000 ug via INTRAMUSCULAR

## 2015-04-07 ENCOUNTER — Other Ambulatory Visit: Payer: Self-pay | Admitting: Cardiovascular Disease

## 2015-04-07 ENCOUNTER — Ambulatory Visit (HOSPITAL_COMMUNITY)
Admission: RE | Admit: 2015-04-07 | Discharge: 2015-04-07 | Disposition: A | Discharge status: Home / Self Care | Payer: Medicare Other | Source: Ambulatory Visit | Attending: Cardiovascular Disease | Admitting: Cardiovascular Disease

## 2015-04-07 DIAGNOSIS — I739 Peripheral vascular disease, unspecified: Secondary | ICD-10-CM

## 2015-04-28 ENCOUNTER — Ambulatory Visit (INDEPENDENT_AMBULATORY_CARE_PROVIDER_SITE_OTHER): Payer: Medicare Other | Admitting: Family Medicine

## 2015-04-28 ENCOUNTER — Other Ambulatory Visit: Payer: Self-pay | Admitting: Internal Medicine

## 2015-04-28 DIAGNOSIS — E538 Deficiency of other specified B group vitamins: Secondary | ICD-10-CM

## 2015-04-28 MED ORDER — CYANOCOBALAMIN 1000 MCG/ML IJ SOLN
1000.0000 ug | Freq: Once | INTRAMUSCULAR | Status: AC
  Administered 2015-04-28: 1000 ug via INTRAMUSCULAR

## 2015-05-01 ENCOUNTER — Other Ambulatory Visit: Payer: Self-pay | Admitting: Internal Medicine

## 2015-06-02 ENCOUNTER — Other Ambulatory Visit: Payer: Self-pay | Admitting: Internal Medicine

## 2015-06-02 ENCOUNTER — Other Ambulatory Visit: Payer: Self-pay | Admitting: Cardiovascular Disease

## 2015-06-02 ENCOUNTER — Ambulatory Visit (INDEPENDENT_AMBULATORY_CARE_PROVIDER_SITE_OTHER): Payer: Medicare Other | Admitting: Family Medicine

## 2015-06-02 DIAGNOSIS — E538 Deficiency of other specified B group vitamins: Secondary | ICD-10-CM

## 2015-06-02 MED ORDER — CYANOCOBALAMIN 1000 MCG/ML IJ SOLN
1000.0000 ug | Freq: Once | INTRAMUSCULAR | Status: AC
  Administered 2015-06-02: 1000 ug via INTRAMUSCULAR

## 2015-06-02 NOTE — Telephone Encounter (Signed)
REFILL 

## 2015-06-30 ENCOUNTER — Ambulatory Visit (INDEPENDENT_AMBULATORY_CARE_PROVIDER_SITE_OTHER): Payer: Medicare Other | Admitting: Family Medicine

## 2015-06-30 DIAGNOSIS — Z23 Encounter for immunization: Secondary | ICD-10-CM

## 2015-06-30 DIAGNOSIS — E538 Deficiency of other specified B group vitamins: Secondary | ICD-10-CM | POA: Diagnosis not present

## 2015-06-30 MED ORDER — CYANOCOBALAMIN 1000 MCG/ML IJ SOLN
1000.0000 ug | Freq: Once | INTRAMUSCULAR | Status: AC
  Administered 2015-06-30: 1000 ug via INTRAMUSCULAR

## 2015-07-05 ENCOUNTER — Encounter: Payer: Self-pay | Admitting: Cardiovascular Disease

## 2015-07-05 ENCOUNTER — Ambulatory Visit (INDEPENDENT_AMBULATORY_CARE_PROVIDER_SITE_OTHER): Payer: Medicare Other | Admitting: Cardiovascular Disease

## 2015-07-05 VITALS — BP 180/56 | HR 58 | Ht 66.0 in | Wt 142.0 lb

## 2015-07-05 DIAGNOSIS — I739 Peripheral vascular disease, unspecified: Secondary | ICD-10-CM

## 2015-07-05 DIAGNOSIS — I2510 Atherosclerotic heart disease of native coronary artery without angina pectoris: Secondary | ICD-10-CM | POA: Diagnosis not present

## 2015-07-05 DIAGNOSIS — I10 Essential (primary) hypertension: Secondary | ICD-10-CM

## 2015-07-05 DIAGNOSIS — I779 Disorder of arteries and arterioles, unspecified: Secondary | ICD-10-CM | POA: Diagnosis not present

## 2015-07-05 NOTE — Assessment & Plan Note (Signed)
History of coronary artery disease status post coronary artery bypass grafting in 2004 by [name] with a LIMA to his LAD, RIMA to the circumflex marginal and vein graft to the distal right coronary artery. Myoview performed 08/23/12 was low risk and nonischemic. He denies chest pain or shortness of breath.

## 2015-07-05 NOTE — Assessment & Plan Note (Signed)
History of carotid artery disease status post right carotid endarterectomy performed by Dr. Deon Pilling in 2006. Carotid Dopplers performed 02/28/14 revealed high-frequency signal in his right distal common carotid artery as well as moderate left ICA stenosis. He is neurologically symptomatically. We will continue to follow him by duplex ultrasound.

## 2015-07-05 NOTE — Assessment & Plan Note (Addendum)
History of hyperlipidemia on Lipitor  20 mg a day with recent lipid profile performed 03/27/15 revealing a total cholesterol 107, LDL 46 and HDL of 49

## 2015-07-05 NOTE — Assessment & Plan Note (Signed)
History of peripheral arterial disease status post femorofemoral crossover grafting by [name] in 2000. Because of worsening claudication I angiogram him on 03/17/14 revealing a widely patent femorofemoral crossover graft and a highly calcified focal mid left SFA stenosis which I subsequently intervened on 05/23/14  With diamondback atherectomy and stenting. His Dopplers improved as did his symptoms. He does have mild recurrent claudication however. His Dopplers in  February of this year suggested high-frequency signal in his mid left SFA however repeat Dopplers in June did not demonstrate this. The ABI was 0.65 on the left with moderate disease in his left external and common iliac arteries. We will continue to follow him clinically and by duplex ultrasound which I will order for December of this year. I will see him back in 6 months

## 2015-07-05 NOTE — Assessment & Plan Note (Signed)
History of hypertension with blood pressure measured at 180/56. He is on losartan and metoprolol as well as nifedipine. Continue current meds at current dosing

## 2015-07-05 NOTE — Progress Notes (Signed)
07/05/2015 Earl Castillo   [birth year]  [id number]  Primary Physician [name], MD Primary Cardiologist: [name] MD Earl Castillo   HPI:  The patient is a 74 year old white male previously followed by [name] and now followed by myself. I last saw him in the office 01/06/15. The last time I saw him was at the time of his intervention in July. His past medical history is significant for CAD, status post coronary artery bypass grafting in 2004 by [name], with a LIMA to his LAD, RIMA to the circumflex marginal and SVG to the distal RCA. He had a Myoview stress test performed 08/23/2012 which was low risk and nonischemic. He also has hypertension and hyperlipidemia. He has a 60+ year history of tobacco use and continues to smoke daily. He has significant peripheral vascular disease. He is status post right carotid endarterectomy performed by [name] in 2006. We have been following his carotid disease yearly by duplex ultrasound. He has also had a left to right fem-fem crossover graft by [name] in May of 2000. He was recently seen by [name] and complained of worsening claudication. Recent lower extremity arterial Doppler studies demonstrated progression of disease in his left common and external iliac arteries. In addition, it was suggested that he had moderately severe distal left SFA disease. Subsequently, on 03/17/2014 he underwent a diagnostic PV angiogram performed by [name]. His fem-fem crossover graft was widely patent. His left iliac also appeared widely patent, suggesting falsely elevated velocities by duplex. He did have a high grade focal calcified mid to distal left SFA stenosis corresponding to an area of abnormality and high velocity in the duplex ultrasound with three-vessel runoff. I performed diamondback orbital rotational atherectomy of his mid left SFA through his femorofemoral crossover graft 05/23/14 with stenting of his proximal left  SFA as well. His subsequent Dopplers revealed an increase in his left ABI from 0.8-1.0 . His claudication has resolved. Since I saw him back probably 6 months ago he's had mild claudication on the left. Dopplers in February suggests a high-frequency signal in his mid left SFA although follow-up Dopplers in June did not demonstrate this.   Current Outpatient Prescriptions  Medication Sig Dispense Refill  . acetaminophen (TYLENOL) 500 MG tablet Take 1,000 mg by mouth daily as needed (for leg pain).     Marland Kitchen amoxicillin-clavulanate (AUGMENTIN) 875-125 MG per tablet Take 1 tablet by mouth 2 (two) times daily. 14 tablet 0  . aspirin EC 81 MG tablet Take 81 mg by mouth daily.    . clopidogrel (PLAVIX) 75 MG tablet Take 1 tablet (75 mg total) by mouth daily. 30 tablet 11  . cyanocobalamin (,VITAMIN B-12,) 1000 MCG/ML injection Inject 1,000 mcg into the muscle every 30 (thirty) days. Last injection 02/25/14    . docusate sodium (COLACE) 100 MG capsule Take 100 mg by mouth daily as needed (for constipation).     . ferrous gluconate (FERGON) 324 MG tablet TAKE 1 TABLET BY MOUTH EVERY DAY WITH BREAKFAST 30 tablet 2  . losartan (COZAAR) 100 MG tablet TAKE 1 TABLET (100 MG TOTAL) BY MOUTH DAILY. 30 tablet 9  . metoprolol (LOPRESSOR) 50 MG tablet Take 0.5 tablets (25 mg total) by mouth daily. 90 tablet 4  . neomycin-colistin-hydrocortisone-thonzonium (CORTISPORIN TC) 3.01-04-09-0.5 MG/ML otic suspension Place 4 drops into the left ear 4 (four) times daily as needed (for itching). 10 mL 0  . NIFEdipine (PROCARDIA XL/ADALAT-CC) 90 MG 24  hr tablet TAKE 1 TABLET BY MOUTH EVERY DAY 90 tablet 2  . simvastatin (ZOCOR) 20 MG tablet TAKE 1 TABLET BY MOUTH EVERY EVENING AFTER SUPPER 90 tablet 2   No current facility-administered medications for this visit.    No Known Allergies  Social History   Social History  . Marital Status: Married    Spouse Name: N/A  . Number of Children: 2  . Years of Education: N/A    Occupational History  .  Semi Retired Geophysicist/field seismologist    Social History Main Topics  . Smoking status: Current Some Day Smoker -- 58 years    Types: Cigarettes  . Smokeless tobacco: Never Used     Comment: 3 cigarettes every couple of days  . Alcohol Use: No  . Drug Use: No  . Sexual Activity: Not Currently   Other Topics Concern  . Not on file   Social History Narrative   Daily caffeine      Review of Systems: General: negative for chills, fever, night sweats or weight changes.  Cardiovascular: negative for chest pain, dyspnea on exertion, edema, orthopnea, palpitations, paroxysmal nocturnal dyspnea or shortness of breath Dermatological: negative for rash Respiratory: negative for cough or wheezing Urologic: negative for hematuria Abdominal: negative for nausea, vomiting, diarrhea, bright red blood per rectum, melena, or hematemesis Neurologic: negative for visual changes, syncope, or dizziness All other systems reviewed and are otherwise negative except as noted above.    Blood pressure 180/56, pulse 58, height 5\' 6"  (1.676 m), weight 142 lb (64.411 kg).  General appearance: alert and no distress Neck: no adenopathy, no JVD, supple, symmetrical, trachea midline, thyroid not enlarged, symmetric, no tenderness/mass/nodules and bilateral carotid bruits Lungs: clear to auscultation bilaterally Heart: regular rate and rhythm, S1, S2 normal, no murmur, click, rub or gallop Extremities: 2+ left common femoral pulse with a loud bruit  EKG not performed today  ASSESSMENT AND PLAN:   PVD- Lt SFA PTA/stent 05/23/14 History of peripheral arterial disease status post femorofemoral crossover grafting by [name] in 2000. Because of worsening claudication I angiogram him on 03/17/14 revealing a widely patent femorofemoral crossover graft and a highly calcified focal mid left SFA stenosis which I subsequently intervened on 05/23/14  With diamondback atherectomy and stenting. His Dopplers  improved as did his symptoms. He does have mild recurrent claudication however. His Dopplers in  February of this year suggested high-frequency signal in his mid left SFA however repeat Dopplers in June did not demonstrate this. The ABI was 0.65 on the left with moderate disease in his left external and common iliac arteries. We will continue to follow him clinically and by duplex ultrasound which I will order for December of this year. I will see him back in 6 months  HYPERLIPIDEMIA History of hyperlipidemia on Lipitor  20 mg a day with recent lipid profile performed 03/27/15 revealing a total cholesterol 107, LDL 46 and HDL of 49  Essential hypertension History of hypertension with blood pressure measured at 180/56. He is on losartan and metoprolol as well as nifedipine. Continue current meds at current dosing  Coronary atherosclerosis History of coronary artery disease status post coronary artery bypass grafting in 2004 by [name] with a LIMA to his LAD, RIMA to the circumflex marginal and vein graft to the distal right coronary artery. Myoview performed 08/23/12 was low risk and nonischemic. He denies chest pain or shortness of breath.  Carotid artery disease History of carotid artery disease status post right carotid endarterectomy  performed by Dr. Deon Pilling in 2006. Carotid Dopplers performed 02/28/14 revealed high-frequency signal in his right distal common carotid artery as well as moderate left ICA stenosis. He is neurologically symptomatically. We will continue to follow him by duplex ultrasound.      [name] MD FACP,FACC,FAHA, [facility] 07/05/2015 10:52 AM

## 2015-07-05 NOTE — Patient Instructions (Signed)
Medication Instructions:   Continue your current medication  Labwork:  n/a  Testing/Procedures:  1. Carotid Duplex- This test is an ultrasound of the carotid arteries in your neck. It looks at blood flow through these arteries that supply the brain with blood. Allow one hour for this exam. There are no restrictions or special instructions.  2. Lower extremity arterial doppler (to be done in December)- During this test, ultrasound is used to evaluate arterial blood flow in the legs. Allow approximately one hour for this exam.      Follow-Up:  6 months with Dr [name]  Any Other Special Instructions Will Be Listed Below (If Applicable).

## 2015-07-14 ENCOUNTER — Ambulatory Visit (HOSPITAL_COMMUNITY)
Admission: RE | Admit: 2015-07-14 | Discharge: 2015-07-14 | Disposition: A | Discharge status: Home / Self Care | Payer: Medicare Other | Source: Ambulatory Visit | Attending: Cardiology | Admitting: Cardiology

## 2015-07-14 ENCOUNTER — Other Ambulatory Visit: Payer: Self-pay | Admitting: Cardiovascular Disease

## 2015-07-14 DIAGNOSIS — I6529 Occlusion and stenosis of unspecified carotid artery: Secondary | ICD-10-CM | POA: Diagnosis present

## 2015-07-14 DIAGNOSIS — I6523 Occlusion and stenosis of bilateral carotid arteries: Secondary | ICD-10-CM | POA: Insufficient documentation

## 2015-07-14 DIAGNOSIS — F17200 Nicotine dependence, unspecified, uncomplicated: Secondary | ICD-10-CM | POA: Diagnosis not present

## 2015-07-14 DIAGNOSIS — I10 Essential (primary) hypertension: Secondary | ICD-10-CM | POA: Diagnosis not present

## 2015-07-14 DIAGNOSIS — I739 Peripheral vascular disease, unspecified: Secondary | ICD-10-CM | POA: Insufficient documentation

## 2015-07-14 DIAGNOSIS — E785 Hyperlipidemia, unspecified: Secondary | ICD-10-CM | POA: Diagnosis not present

## 2015-07-14 NOTE — Telephone Encounter (Signed)
Rx request sent to pharmacy.  

## 2015-07-20 ENCOUNTER — Other Ambulatory Visit: Payer: Self-pay

## 2015-07-20 DIAGNOSIS — I779 Disorder of arteries and arterioles, unspecified: Secondary | ICD-10-CM

## 2015-07-20 DIAGNOSIS — I739 Peripheral vascular disease, unspecified: Principal | ICD-10-CM

## 2015-08-01 ENCOUNTER — Other Ambulatory Visit: Payer: Self-pay | Admitting: Internal Medicine

## 2015-08-01 NOTE — Telephone Encounter (Signed)
Advise, last seen by you 11/2013.  Last CBC 06/2014.  Thank you Sir.

## 2015-08-02 ENCOUNTER — Other Ambulatory Visit: Payer: Self-pay

## 2015-08-02 MED ORDER — FERROUS GLUCONATE 324 (38 FE) MG PO TABS: ORAL_TABLET | ORAL | Status: DC

## 2015-08-02 NOTE — Telephone Encounter (Signed)
Ok x 1 year per Dr [name] on his [place].

## 2015-08-02 NOTE — Telephone Encounter (Signed)
Refill x 1 year 

## 2015-08-04 ENCOUNTER — Ambulatory Visit (INDEPENDENT_AMBULATORY_CARE_PROVIDER_SITE_OTHER): Payer: Medicare Other | Admitting: Family Medicine

## 2015-08-04 DIAGNOSIS — Z23 Encounter for immunization: Secondary | ICD-10-CM | POA: Diagnosis not present

## 2015-08-04 DIAGNOSIS — E538 Deficiency of other specified B group vitamins: Secondary | ICD-10-CM | POA: Diagnosis not present

## 2015-08-04 MED ORDER — CYANOCOBALAMIN 1000 MCG/ML IJ SOLN
1000.0000 ug | Freq: Once | INTRAMUSCULAR | Status: AC
  Administered 2015-08-04: 1000 ug via INTRAMUSCULAR

## 2015-09-01 ENCOUNTER — Ambulatory Visit (INDEPENDENT_AMBULATORY_CARE_PROVIDER_SITE_OTHER): Payer: Medicare Other | Admitting: Family Medicine

## 2015-09-01 DIAGNOSIS — E538 Deficiency of other specified B group vitamins: Secondary | ICD-10-CM

## 2015-09-01 MED ORDER — CYANOCOBALAMIN 1000 MCG/ML IJ SOLN
1000.0000 ug | Freq: Once | INTRAMUSCULAR | Status: AC
  Administered 2015-09-01: 1000 ug via INTRAMUSCULAR

## 2015-09-27 ENCOUNTER — Ambulatory Visit (INDEPENDENT_AMBULATORY_CARE_PROVIDER_SITE_OTHER): Payer: Medicare Other | Admitting: Family Medicine

## 2015-09-27 DIAGNOSIS — E538 Deficiency of other specified B group vitamins: Secondary | ICD-10-CM | POA: Diagnosis not present

## 2015-09-27 MED ORDER — CYANOCOBALAMIN 1000 MCG/ML IJ SOLN
1000.0000 ug | Freq: Once | INTRAMUSCULAR | Status: AC
  Administered 2015-09-27: 1000 ug via INTRAMUSCULAR

## 2015-10-31 ENCOUNTER — Ambulatory Visit (INDEPENDENT_AMBULATORY_CARE_PROVIDER_SITE_OTHER): Payer: Medicare Other | Admitting: Adult Health

## 2015-10-31 ENCOUNTER — Ambulatory Visit (INDEPENDENT_AMBULATORY_CARE_PROVIDER_SITE_OTHER)
Admission: RE | Admit: 2015-10-31 | Discharge: 2015-10-31 | Disposition: A | Discharge status: Home / Self Care | Payer: Medicare Other | Source: Ambulatory Visit | Attending: Adult Health | Admitting: Adult Health

## 2015-10-31 DIAGNOSIS — I2510 Atherosclerotic heart disease of native coronary artery without angina pectoris: Secondary | ICD-10-CM

## 2015-10-31 DIAGNOSIS — J069 Acute upper respiratory infection, unspecified: Secondary | ICD-10-CM

## 2015-10-31 DIAGNOSIS — R05 Cough: Secondary | ICD-10-CM | POA: Diagnosis not present

## 2015-10-31 MED ORDER — METHYLPREDNISOLONE 4 MG PO TBPK: ORAL_TABLET | ORAL | Status: DC

## 2015-10-31 MED ORDER — AMOXICILLIN-POT CLAVULANATE 875-125 MG PO TABS: 1.0000 | ORAL_TABLET | Freq: Two times a day (BID) | ORAL | Status: DC

## 2015-10-31 NOTE — Progress Notes (Signed)
   Subjective:    Patient ID: Earl Castillo, male    DOB: [birth year], 74 y.o.   MRN: [id number]  HPI  74 year old male, patient of Dr. Sarajane Jews. I am seeing him today for an acute issue of upper respiratory type symptoms. His symptoms include productive cough (yellow mucus), congestion, sinus pain and pressure, PND , headache, bilateral ear pain, and low grade subjective fever. His symptoms started 8 days ago but we the worse at days 5-7. He endorses that his cough is decreasing.    Has been taking tylenol and using cough drops  Review of Systems  Constitutional: Positive for fever and fatigue. Negative for diaphoresis.  HENT: Positive for congestion, ear pain, hearing loss, postnasal drip, rhinorrhea, sinus pressure and sore throat. Negative for tinnitus and trouble swallowing.   Eyes: Negative.   Respiratory: Positive for cough and wheezing. Negative for choking, chest tightness and stridor.   Cardiovascular: Negative.   Neurological: Positive for headaches. Negative for dizziness and weakness.  Hematological: Negative.   Psychiatric/Behavioral: Negative.        Objective:   Physical Exam  Constitutional: He is oriented to person, place, and time. He appears well-developed and well-nourished. No distress.  HENT:  Head: Normocephalic and atraumatic.  Right Ear: External ear normal.  Left Ear: External ear normal.  Nose: Nose normal.  Mouth/Throat: Oropharynx is clear and moist. No oropharyngeal exudate.  Narrow ear canals, no sighs of infection +PND  Eyes: Conjunctivae and EOM are normal. Pupils are equal, round, and reactive to light. Right eye exhibits no discharge. Left eye exhibits no discharge.  Neck: Normal range of motion. Neck supple.  Cardiovascular: Normal rate, regular rhythm, normal heart sounds and intact distal pulses.  Exam reveals no gallop and no friction rub.   No murmur heard. Pulmonary/Chest: Effort normal. No respiratory distress. He has wheezes (full fields). He  has no rales. He exhibits no tenderness.  Rhonchi full fields  Lymphadenopathy:    He has no cervical adenopathy.  Neurological: He is alert and oriented to person, place, and time.  Skin: Skin is warm and dry. No rash noted. He is not diaphoretic. No erythema. No pallor.  Psychiatric: He has a normal mood and affect. His behavior is normal. Judgment and thought content normal.  Nursing note and vitals reviewed.      Assessment & Plan:  1. Acute upper respiratory infection - DG Chest 2 View; Future - Negative for pneumonia. Did show some COPD changes. Will treat with course of augmentin and medrol dose pack.  - amoxicillin-clavulanate (AUGMENTIN) 875-125 MG tablet; Take 1 tablet by mouth 2 (two) times daily.  Dispense: 20 tablet; Refill: 0 - methylPREDNISolone (MEDROL DOSEPAK) 4 MG TBPK tablet; Take as directed  Dispense: 21 tablet; Refill: 0 - Follow up if no improvement in 2-3 days.

## 2015-10-31 NOTE — Patient Instructions (Signed)
It was great meeting you today!  I will follow up with you regarding your chest x ray and then we will come up with a plan of care.   Monitor your blood pressure over the week ( morning and night) bring your readings on Friday for your injection.

## 2015-11-01 ENCOUNTER — Encounter: Payer: Self-pay | Admitting: *Deleted

## 2015-11-01 ENCOUNTER — Telehealth: Payer: Self-pay | Admitting: *Deleted

## 2015-11-01 NOTE — Telephone Encounter (Signed)
Pt saw Cory on 12/27.   PLEASE NOTE: All timestamps contained within this report are represented as [place] Standard Time. CONFIDENTIALTY NOTICE: This fax transmission is intended only for the addressee. It contains information that is legally privileged, confidential or otherwise protected from use or disclosure. If you are not the intended recipient, you are strictly prohibited from reviewing, disclosing, copying using or disseminating any of this information or taking any action in reliance on or regarding this information. If you have received this fax in error, please notify us immediately by telephone so that we can arrange for its return to [place]. Phone: [phone], Toll-Free: [phone], Fax: [phone] Page: 1 of 2 Call Id: [id number] [facility] Night - Client Earl Castillo Patient Name: Earl Castillo Gender: Male DOB: 11-May-1941 Age: 74 Y 3 M 23 D Return Phone Number: [phone] (Primary) Address: City/State/Zip: [place] Client [facility] Night - Client Client Site [facility] - Night Physician [place], [place] Type Call Call Type Triage / Clinical Relationship To Patient Self Return Phone Number [phone] (Primary) Chief Complaint Nasal Congestion Initial Comment Caller states has bad sinus infection; began 2 days ago and has gotten worse; pressure across forehead; ears ringing, productive cough; sinus drainage; PreDisposition Go to ED Nurse Assessment Nurse: Malva Cogan, RN, Juliann Pulse Date/Time (Eastern Time): 10/28/2015 10:42:44 AM Confirm and document reason for call. If symptomatic, describe symptoms. ---Caller states that he thinks that he has a sinus infection, reports that 1-2 days ago he began having increased pressure across his forehead, ears are ringing, productive cough, & sinus drainage but no fever. States that symptoms are getting worse. Caller  advised that triager can address his symptoms but meds are not called in after hrs. Has the patient traveled out of the country within the last 30 days? ---No Does the patient have any new or worsening symptoms? ---Yes Will a triage be completed? ---Yes Related visit to physician within the last 2 weeks? ---No Does the PT have any chronic conditions? (i.e. diabetes, asthma, etc.) ---Yes List chronic conditions. ---HTN, vascular disease, high cholesterol, acid reflux Is this a behavioral health or substance abuse call? ---No Guidelines Guideline Title Affirmed Question Affirmed Notes Nurse Date/Time Eilene Ghazi Time) Sinus Pain or Congestion Lots of coughing Carola Rhine 10/28/2015 10:45:29 AM Disp. Time Eilene Ghazi Time) Disposition Final User 10/28/2015 10:51:13 AM See PCP When Office is Open (within 3 days) Yes Malva Cogan, RN, Juliann Pulse PLEASE NOTE: All timestamps contained within this report are represented as [place] Standard Time. CONFIDENTIALTY NOTICE: This fax transmission is intended only for the addressee. It contains information that is legally privileged, confidential or otherwise protected from use or disclosure. If you are not the intended recipient, you are strictly prohibited from reviewing, disclosing, copying using or disseminating any of this information or taking any action in reliance on or regarding this information. If you have received this fax in error, please notify us immediately by telephone so that we can arrange for its return to [place]. Phone: [phone], Toll-Free: [phone], Fax: [phone] Page: 2 of 2 Call Id: [id number] Caller Understands: Yes Disagree/Comply: Comply Care Advice Given Per Guideline SEE PCP WITHIN 3 DAYS: * You need to be seen within 2 or 3 days. Call your doctor during regular office hours and make an appointment. An urgent care center is often the best source of care if your doctor's office is closed or you can't get an  appointment. NOTE: If office will be open  tomorrow, tell caller to call then, not in 3 days. COUGH MEDICINES: * OTC COUGH SYRUPS: The most common cough suppressant in OTC cough medications is dextromethorphan. Often the letters 'DM' appear in the name. * OTC COUGH DROPS: Cough drops can help a lot, especially for mild coughs. They reduce coughing by soothing your irritated throat and removing that tickle sensation in the back of the throat. Cough drops also have the advantage of portability - you can carry them with you. * HOME REMEDY - HARD CANDY: Hard candy works just as well as medicine-flavored OTC cough drops. Diabetics should use sugar-free candy. * HOME REMEDY - HONEY: This old home remedy has been shown to help decrease coughing at night. The adult dosage is 2 teaspoons (10 ml) at bedtime. Honey should not be given to infants under one year of age. OTC COUGH SYRUP - DEXTROMETHORPHAN: * Cough syrups containing the cough suppressant dextromethorphan (DM) may help decrease your cough. Cough syrups work best for coughs that keep you awake at night. They can also sometimes help in the late stages of a respiratory infection when the cough is dry and hacking. They can be used along with cough drops. * Examples: Benylin, Robitussin DM, Vicks 44 Cough Relief * Read the package instructions for dosage, contraindications, and other important information. CAUTION - DEXTROMETHORPHAN: * Do not try to completely suppress coughs that produce mucus and phlegm. Remember that coughing is helpful in bringing up mucus from the lungs and preventing pneumonia. PAIN MEDICINES: * For pain relief, take acetaminophen, ibuprofen, or naproxen. * Use the lowest amount that makes your pain feel better. ACETAMINOPHEN (E.G., TYLENOL): * Take 650 mg (two 325 mg pills) by mouth every 4-6 hours as needed. Each Regular Strength Tylenol pill has 325 mg of acetaminophen. The most you should take each day is 3,250 mg (10 Regular  Strength pills a day). * Another choice is to take 1,000 mg (two 500 mg pills) every 8 hours as needed. Each Extra Strength Tylenol pill has 500 mg of acetaminophen. The most you should take each day is 3,000 mg (6 Extra Strength pills a day). EXTRA NOTES: * Acetaminophen is thought to be safer than ibuprofen or naproxen for people over 69 years old. Acetaminophen is in many OTC and prescription medicines. It might be in more than one medicine that you are taking. You need to be careful and not take an overdose. An acetaminophen overdose can hurt the liver. * Before taking any medicine, read all the instructions on the package. CALL BACK IF: * Difficulty breathing (and not relieved by cleaning out nose) * You become worse. CARE ADVICE given per Sinus Pain or Congestion (Adult) guideline. After Care Instructions Given Call Event Type User Date / Time Description Comments User: Olena Mater, RN Date/Time Eilene Ghazi Time): 10/28/2015 10:45:39 AM speaking in complete sentences now User: Olena Mater, RN Date/Time Eilene Ghazi Time): 10/28/2015 10:46:56 AM 2 on pain scale User: Olena Mater, RN Date/Time (Eastern Time): 10/28/2015 10:48:09 AM has not been using saline nasal washes & states they do not help him

## 2015-11-03 ENCOUNTER — Ambulatory Visit (INDEPENDENT_AMBULATORY_CARE_PROVIDER_SITE_OTHER): Payer: Medicare Other | Admitting: Family Medicine

## 2015-11-03 DIAGNOSIS — E538 Deficiency of other specified B group vitamins: Secondary | ICD-10-CM

## 2015-11-03 MED ORDER — CYANOCOBALAMIN 1000 MCG/ML IJ SOLN
1000.0000 ug | Freq: Once | INTRAMUSCULAR | Status: AC
  Administered 2015-11-03: 1000 ug via INTRAMUSCULAR

## 2015-11-13 ENCOUNTER — Telehealth: Payer: Self-pay | Admitting: Family Medicine

## 2015-11-13 NOTE — Telephone Encounter (Signed)
Can you call pt to schedule a CPE and will need a early morning due to pt fasting for labs?

## 2015-11-14 NOTE — Telephone Encounter (Signed)
Pt scheduled  

## 2015-11-28 ENCOUNTER — Other Ambulatory Visit: Payer: Self-pay | Admitting: Internal Medicine

## 2015-11-29 NOTE — Telephone Encounter (Signed)
Refill x 1 year 

## 2015-11-29 NOTE — Telephone Encounter (Signed)
Please advise, last seen Jan. 2015.

## 2015-12-01 ENCOUNTER — Ambulatory Visit: Payer: Medicare Other | Admitting: Family Medicine

## 2015-12-07 ENCOUNTER — Ambulatory Visit (INDEPENDENT_AMBULATORY_CARE_PROVIDER_SITE_OTHER): Payer: Medicare Other | Admitting: Family Medicine

## 2015-12-07 ENCOUNTER — Encounter: Payer: Self-pay | Admitting: Family Medicine

## 2015-12-07 VITALS — BP 194/86 | HR 63 | Temp 98.4°F | Ht 66.0 in | Wt 136.0 lb

## 2015-12-07 DIAGNOSIS — E785 Hyperlipidemia, unspecified: Secondary | ICD-10-CM

## 2015-12-07 DIAGNOSIS — I739 Peripheral vascular disease, unspecified: Secondary | ICD-10-CM

## 2015-12-07 DIAGNOSIS — I10 Essential (primary) hypertension: Secondary | ICD-10-CM | POA: Diagnosis not present

## 2015-12-07 DIAGNOSIS — I779 Disorder of arteries and arterioles, unspecified: Secondary | ICD-10-CM

## 2015-12-07 DIAGNOSIS — Z720 Tobacco use: Secondary | ICD-10-CM

## 2015-12-07 DIAGNOSIS — F17200 Nicotine dependence, unspecified, uncomplicated: Secondary | ICD-10-CM

## 2015-12-07 DIAGNOSIS — N401 Enlarged prostate with lower urinary tract symptoms: Secondary | ICD-10-CM

## 2015-12-07 DIAGNOSIS — E538 Deficiency of other specified B group vitamins: Secondary | ICD-10-CM

## 2015-12-07 DIAGNOSIS — N138 Other obstructive and reflux uropathy: Secondary | ICD-10-CM

## 2015-12-07 LAB — BASIC METABOLIC PANEL
BUN: 19 mg/dL (ref 6–23)
CALCIUM: 8.9 mg/dL (ref 8.4–10.5)
CO2: 30 meq/L (ref 19–32)
Chloride: 105 mEq/L (ref 96–112)
Creatinine, Ser: 0.96 mg/dL (ref 0.40–1.50)
GFR: 81.29 mL/min (ref >60.00)
Glucose, Bld: 94 mg/dL (ref 70–99)
POTASSIUM: 3.9 meq/L (ref 3.5–5.1)
SODIUM: 140 meq/L (ref 135–145)

## 2015-12-07 LAB — HEPATIC FUNCTION PANEL
ALK PHOS: 58 U/L (ref 39–117)
ALT: 9 U/L (ref 0–53)
AST: 17 U/L (ref 0–37)
Albumin: 3.7 g/dL (ref 3.5–5.2)
BILIRUBIN DIRECT: 0.1 mg/dL (ref 0.0–0.3)
BILIRUBIN TOTAL: 0.5 mg/dL (ref 0.2–1.2)
Total Protein: 6.5 g/dL (ref 6.0–8.3)

## 2015-12-07 LAB — CBC WITH DIFFERENTIAL/PLATELET
Basophils Absolute: 0 10*3/uL (ref 0.0–0.1)
Basophils Relative: 0.4 % (ref 0.0–3.0)
EOS PCT: 1.2 % (ref 0.0–5.0)
Eosinophils Absolute: 0.1 10*3/uL (ref 0.0–0.7)
HCT: 43.1 % (ref 39.0–52.0)
Hemoglobin: 14.1 g/dL (ref 13.0–17.0)
LYMPHS PCT: 21.4 % (ref 12.0–46.0)
Lymphs Abs: 2.1 10*3/uL (ref 0.7–4.0)
MCHC: 32.8 g/dL (ref 30.0–36.0)
MCV: 98.7 fl (ref 78.0–100.0)
MONOS PCT: 7.6 % (ref 3.0–12.0)
Monocytes Absolute: 0.7 10*3/uL (ref 0.1–1.0)
Neutro Abs: 6.8 10*3/uL (ref 1.4–7.7)
Neutrophils Relative %: 69.4 % (ref 43.0–77.0)
Platelets: 155 10*3/uL (ref 150.0–400.0)
RBC: 4.37 Mil/uL (ref 4.22–5.81)
RDW: 14.3 % (ref 11.5–15.5)
WBC: 9.9 10*3/uL (ref 4.0–10.5)

## 2015-12-07 LAB — POCT URINALYSIS DIPSTICK
BILIRUBIN UA: NEGATIVE
GLUCOSE UA: NEGATIVE
Ketones, UA: NEGATIVE
Leukocytes, UA: NEGATIVE
NITRITE UA: NEGATIVE
Spec Grav, UA: >=1.03
UROBILINOGEN UA: 0.2
pH, UA: 6

## 2015-12-07 LAB — LIPID PANEL
CHOLESTEROL: 117 mg/dL (ref 0–200)
HDL: 54.1 mg/dL (ref >39.00)
LDL Cholesterol: 49 mg/dL (ref 0–99)
NONHDL: 62.67
Total CHOL/HDL Ratio: 2
Triglycerides: 67 mg/dL (ref 0.0–149.0)
VLDL: 13.4 mg/dL (ref 0.0–40.0)

## 2015-12-07 LAB — PSA: PSA: 0.79 ng/mL (ref 0.10–4.00)

## 2015-12-07 LAB — TSH: TSH: 1.25 u[IU]/mL (ref 0.35–4.50)

## 2015-12-07 LAB — VITAMIN B12: VITAMIN B 12: 434 pg/mL (ref 211–911)

## 2015-12-07 MED ORDER — CYANOCOBALAMIN 1000 MCG/ML IJ SOLN
1000.0000 ug | Freq: Once | INTRAMUSCULAR | Status: AC
  Administered 2015-12-07: 1000 ug via INTRAMUSCULAR

## 2015-12-07 MED ORDER — METOPROLOL TARTRATE 50 MG PO TABS: 50.0000 mg | ORAL_TABLET | Freq: Every day | ORAL | Status: DC

## 2015-12-07 MED ORDER — LOSARTAN POTASSIUM-HCTZ 100-25 MG PO TABS: 1.0000 | ORAL_TABLET | Freq: Every day | ORAL | Status: DC

## 2015-12-07 NOTE — Addendum Note (Signed)
Addended by: Aggie Hacker A on: 12/07/2015 11:10 AM   Modules accepted: Orders

## 2015-12-07 NOTE — Progress Notes (Signed)
Pre visit review using our clinic review tool, if applicable. No additional management support is needed unless otherwise documented below in the visit note. 

## 2015-12-07 NOTE — Progress Notes (Signed)
   Subjective:    Patient ID: Earl Castillo, male    DOB: June 15, 1941, 75 y.o.   MRN: [id number]  HPI 75 yr old male for a check up. His BP at home has been running high with systolics in the 123456 and 170s for the past month. He has been feeling fine. He sees [name] regularly. He asks about wat to use to quit smoking and I recommended Chantix. He has done some research on this and he has concerns about safety. He watches the fats in his diet. He gets exercise.    Review of Systems  Constitutional: Negative.   HENT: Negative.   Eyes: Negative.   Respiratory: Negative.   Cardiovascular: Negative.   Gastrointestinal: Negative.   Genitourinary: Negative.   Musculoskeletal: Negative.   Skin: Negative.   Neurological: Negative.   Psychiatric/Behavioral: Negative.        Objective:   Physical Exam  Constitutional: He is oriented to person, place, and time. He appears well-developed and well-nourished. No distress.  HENT:  Head: Normocephalic and atraumatic.  Right Ear: External ear normal.  Left Ear: External ear normal.  Nose: Nose normal.  Mouth/Throat: Oropharynx is clear and moist. No oropharyngeal exudate.  Eyes: Conjunctivae and EOM are normal. Pupils are equal, round, and reactive to light. Right eye exhibits no discharge. Left eye exhibits no discharge. No scleral icterus.  Neck: Neck supple. No JVD present. No tracheal deviation present. No thyromegaly present.  Cardiovascular: Normal rate, regular rhythm, normal heart sounds and intact distal pulses.  Exam reveals no gallop and no friction rub.   No murmur heard. Pulmonary/Chest: Effort normal and breath sounds normal. No respiratory distress. He has no wheezes. He has no rales. He exhibits no tenderness.  Abdominal: Soft. Bowel sounds are normal. He exhibits no distension and no mass. There is no tenderness. There is no rebound and no guarding.  Genitourinary: Rectum normal, prostate normal and penis normal. Guaiac  negative stool. No penile tenderness.  Musculoskeletal: Normal range of motion. He exhibits no edema or tenderness.  Lymphadenopathy:    He has no cervical adenopathy.  Neurological: He is alert and oriented to person, place, and time. He has normal reflexes. No cranial nerve deficit. He exhibits normal muscle tone. Coordination normal.  Skin: Skin is warm and dry. No rash noted. He is not diaphoretic. No erythema. No pallor.  Psychiatric: He has a normal mood and affect. His behavior is normal. Judgment and thought content normal.          Assessment & Plan:  For the HTN, we will increase Metoprolol to a whole 50 mg tablet daily and we will change Losartan to Losartan HCT 100/25 daily. He will monitor the BP at home and report to [place] in 3-4 weeks. He will get fasting labs today to check his lipids and vitamin B12 level. We discussed diet and exercise advice. I strongly recommended he try Chantix to help quit smoking and he will let us know what he decides.

## 2015-12-14 ENCOUNTER — Other Ambulatory Visit: Payer: Self-pay | Admitting: Cardiovascular Disease

## 2015-12-14 NOTE — Telephone Encounter (Signed)
Rx request sent to pharmacy.  

## 2015-12-29 ENCOUNTER — Ambulatory Visit (INDEPENDENT_AMBULATORY_CARE_PROVIDER_SITE_OTHER): Payer: Medicare Other | Admitting: Family Medicine

## 2015-12-29 DIAGNOSIS — E538 Deficiency of other specified B group vitamins: Secondary | ICD-10-CM

## 2015-12-29 MED ORDER — CYANOCOBALAMIN 1000 MCG/ML IJ SOLN
1000.0000 ug | Freq: Once | INTRAMUSCULAR | Status: AC
  Administered 2015-12-29: 1000 ug via INTRAMUSCULAR

## 2016-01-04 ENCOUNTER — Other Ambulatory Visit: Payer: Self-pay | Admitting: Cardiovascular Disease

## 2016-01-11 DIAGNOSIS — L821 Other seborrheic keratosis: Secondary | ICD-10-CM | POA: Diagnosis not present

## 2016-01-11 DIAGNOSIS — L820 Inflamed seborrheic keratosis: Secondary | ICD-10-CM | POA: Diagnosis not present

## 2016-01-11 DIAGNOSIS — L570 Actinic keratosis: Secondary | ICD-10-CM | POA: Diagnosis not present

## 2016-01-11 DIAGNOSIS — L814 Other melanin hyperpigmentation: Secondary | ICD-10-CM | POA: Diagnosis not present

## 2016-01-12 ENCOUNTER — Other Ambulatory Visit: Payer: Self-pay | Admitting: Cardiovascular Disease

## 2016-01-12 NOTE — Telephone Encounter (Signed)
Rx(s) sent to pharmacy electronically.  

## 2016-02-02 ENCOUNTER — Ambulatory Visit (INDEPENDENT_AMBULATORY_CARE_PROVIDER_SITE_OTHER): Payer: Medicare Other | Admitting: Family Medicine

## 2016-02-02 DIAGNOSIS — E538 Deficiency of other specified B group vitamins: Secondary | ICD-10-CM

## 2016-02-02 MED ORDER — CYANOCOBALAMIN 1000 MCG/ML IJ SOLN
1000.0000 ug | Freq: Once | INTRAMUSCULAR | Status: AC
  Administered 2016-02-02: 1000 ug via INTRAMUSCULAR

## 2016-02-05 ENCOUNTER — Telehealth: Payer: Self-pay | Admitting: Family Medicine

## 2016-02-05 MED ORDER — METOPROLOL TARTRATE 50 MG PO TABS: 50.0000 mg | ORAL_TABLET | Freq: Every day | ORAL | Status: DC

## 2016-02-05 NOTE — Telephone Encounter (Signed)
Pt request refill of the following:  metoprolol (LOPRESSOR) 50 MG tablet  Pt said Dr Sarajane Jews told him start back taking 1 pill a day instead of a half pill and now he is out    Phamacy:  [facility]

## 2016-02-05 NOTE — Telephone Encounter (Signed)
I sent script e-scribe and left a message for pt to call back and give some recent blood pressure readings.

## 2016-02-12 ENCOUNTER — Other Ambulatory Visit: Payer: Self-pay | Admitting: *Deleted

## 2016-02-12 MED ORDER — CLOPIDOGREL BISULFATE 75 MG PO TABS: 75.0000 mg | ORAL_TABLET | Freq: Every day | ORAL | Status: DC

## 2016-02-12 NOTE — Telephone Encounter (Signed)
REFILL 

## 2016-03-01 ENCOUNTER — Ambulatory Visit (INDEPENDENT_AMBULATORY_CARE_PROVIDER_SITE_OTHER): Payer: Medicare Other | Admitting: Family Medicine

## 2016-03-01 ENCOUNTER — Telehealth: Payer: Self-pay | Admitting: Cardiovascular Disease

## 2016-03-01 DIAGNOSIS — E538 Deficiency of other specified B group vitamins: Secondary | ICD-10-CM

## 2016-03-01 MED ORDER — CYANOCOBALAMIN 1000 MCG/ML IJ SOLN
1000.0000 ug | Freq: Once | INTRAMUSCULAR | Status: AC
  Administered 2016-03-01: 1000 ug via INTRAMUSCULAR

## 2016-03-01 NOTE — Telephone Encounter (Signed)
Closed encounter °

## 2016-03-05 ENCOUNTER — Ambulatory Visit: Payer: Medicare Other | Admitting: Cardiovascular Disease

## 2016-03-07 ENCOUNTER — Encounter: Payer: Self-pay | Admitting: Cardiovascular Disease

## 2016-03-07 ENCOUNTER — Ambulatory Visit (INDEPENDENT_AMBULATORY_CARE_PROVIDER_SITE_OTHER): Payer: Medicare Other | Admitting: Cardiovascular Disease

## 2016-03-07 VITALS — BP 158/56 | HR 44 | Ht 66.5 in | Wt 140.4 lb

## 2016-03-07 DIAGNOSIS — I779 Disorder of arteries and arterioles, unspecified: Secondary | ICD-10-CM | POA: Diagnosis not present

## 2016-03-07 DIAGNOSIS — I739 Peripheral vascular disease, unspecified: Secondary | ICD-10-CM

## 2016-03-07 DIAGNOSIS — I6529 Occlusion and stenosis of unspecified carotid artery: Secondary | ICD-10-CM

## 2016-03-07 NOTE — Patient Instructions (Signed)
Medication Instructions:  Your physician recommends that you continue on your current medications as directed. Please refer to the Current Medication list given to you today.   Labwork: none  Testing/Procedures: Your physician has requested that you have a lower extremity arterial doppler- During this test, ultrasound is used to evaluate arterial blood flow in the legs. Allow approximately one hour for this exam. June 2017  Your physician has requested that you have a carotid duplex. This test is an ultrasound of the carotid arteries in your neck. It looks at blood flow through these arteries that supply the brain with blood. Allow one hour for this exam. There are no restrictions or special instructions. June 2017    Follow-Up: Your physician wants you to follow-up in: 12 months with [name]. You will receive a reminder letter in the mail two months in advance. If you don't receive a letter, please call our office to schedule the follow-up appointment.   Any Other Special Instructions Will Be Listed Below (If Applicable).     If you need a refill on your cardiac medications before your next appointment, please call your pharmacy.

## 2016-03-07 NOTE — Assessment & Plan Note (Signed)
History of carotid artery disease status post left carotid endarterectomy performed by Dr. Deon Pilling in 2006. We've been following this by duplex ultrasound which most recently performed 07/14/15 revealed mild right internal carotid artery stenosis with mild to moderateleft. He remains neurologically asymptomatic. We are following this every 6 months.

## 2016-03-07 NOTE — Assessment & Plan Note (Signed)
History of hypertension with blood pressure measured 158/56. He is on losartan, hydrochlorothiazide and metoprolol as well as nifedipine. Continued current meds at current dosing

## 2016-03-07 NOTE — Assessment & Plan Note (Signed)
History of hyperlipidemia on statin therapy with recent lipid profile performed 12/07/15 revealed a total cholesterol 117, LDL 49 and HDL of 54.

## 2016-03-07 NOTE — Assessment & Plan Note (Signed)
History of peripheral arterial disease status post left to right femorofemoral crossover graft by [name] May 2000. Because of claudication by angiogram him in May 2015 and ultimately revascularized him 05/23/14. I performed diamond back orbital rotation left rectum E, drug-eluting angioplasty of a high-grade calcified mid left SFA stenosis with stenting of the proximal left SFA. His most recent Dopplers performed 05/04/15 revealed a right ABI 0.9 and a left of 0.65. The intravenous that the segments appeared patent.

## 2016-03-07 NOTE — Progress Notes (Signed)
03/07/2016 Earl Castillo Ariel   30-Jul-1941  [id number]  Primary Physician [name], MD Primary Cardiologist: [name] MD Earl Castillo   HPI:  The patient is a 75 year old white male previously followed by [name] and now followed by myself. I last saw him in the office 07/05/15. The last time I saw him was at the time of his intervention in July. His past medical history is significant for CAD, status post coronary artery bypass grafting in 2004 by [name], with a LIMA to his LAD, RIMA to the circumflex marginal and SVG to the distal RCA. He had a Myoview stress test performed 08/23/2012 which was low risk and nonischemic. He also has hypertension and hyperlipidemia. He has a 60+ year history of tobacco use and continues to smoke daily. He has significant peripheral vascular disease. He is status post right carotid endarterectomy performed by [name] in 2006. We have been following his carotid disease yearly by duplex ultrasound. He has also had a left to right fem-fem crossover graft by [name] in May of 2000. He was recently seen by [name] and complained of worsening claudication. Recent lower extremity arterial Doppler studies demonstrated progression of disease in his left common and external iliac arteries. In addition, it was suggested that he had moderately severe distal left SFA disease. Subsequently, on 03/17/2014 he underwent a diagnostic PV angiogram performed by [name]. His fem-fem crossover graft was widely patent. His left iliac also appeared widely patent, suggesting falsely elevated velocities by duplex. He did have a high grade focal calcified mid to distal left SFA stenosis corresponding to an area of abnormality and high velocity in the duplex ultrasound with three-vessel runoff. I performed diamondback orbital rotational atherectomy of his mid left SFA through his femorofemoral crossover graft 05/23/14 with stenting of his proximal left  SFA as well. His subsequent Dopplers revealed an increase in his left ABI from 0.8-1.0 . His claudication has resolved. Since I saw him in the office 07/05/15 he denies chest pain or shortness of breath. He also denies claudication.  Current Outpatient Prescriptions  Medication Sig Dispense Refill  . acetaminophen (TYLENOL) 500 MG tablet Take 1,000 mg by mouth daily as needed (for leg pain).     Marland Kitchen aspirin EC 81 MG tablet Take 81 mg by mouth daily.    . clopidogrel (PLAVIX) 75 MG tablet Take 1 tablet (75 mg total) by mouth daily. KEEP OV. 30 tablet 0  . cyanocobalamin (,VITAMIN B-12,) 1000 MCG/ML injection Inject 1,000 mcg into the muscle every 30 (thirty) days.     Marland Kitchen docusate sodium (COLACE) 100 MG capsule Take 100 mg by mouth daily as needed (for constipation).     . ferrous gluconate (FERGON) 324 MG tablet TAKE 1 TABLET BY MOUTH EVERY DAY WITH BREAKFAST 30 tablet 11  . losartan-hydrochlorothiazide (HYZAAR) 100-25 MG tablet Take 1 tablet by mouth daily. 30 tablet 11  . metoprolol (LOPRESSOR) 50 MG tablet Take 1 tablet (50 mg total) by mouth daily. 30 tablet 1  . neomycin-colistin-hydrocortisone-thonzonium (CORTISPORIN TC) 3.01-04-09-0.5 MG/ML otic suspension Place 4 drops into the left ear 4 (four) times daily as needed (for itching). 10 mL 0  . NIFEdipine (PROCARDIA XL/ADALAT-CC) 90 MG 24 hr tablet TAKE 1 TABLET BY MOUTH EVERY DAY 90 tablet 2  . simvastatin (ZOCOR) 20 MG tablet TAKE 1 TABLET BY MOUTH EVERY EVENING AFTER SUPPER 90 tablet 2   No current facility-administered medications for this visit.  No Known Allergies  Social History   Social History  . Marital Status: Married    Spouse Name: N/A  . Number of Children: 2  . Years of Education: N/A   Occupational History  .  Semi Retired Geophysicist/field seismologist    Social History Main Topics  . Smoking status: Current Some Day Smoker -- 58 years    Types: Cigarettes  . Smokeless tobacco: Never Used     Comment: 3 cigarettes every couple of days    . Alcohol Use: No  . Drug Use: No  . Sexual Activity: Not Currently   Other Topics Concern  . Not on file   Social History Narrative   Daily caffeine      Review of Systems: General: negative for chills, fever, night sweats or weight changes.  Cardiovascular: negative for chest pain, dyspnea on exertion, edema, orthopnea, palpitations, paroxysmal nocturnal dyspnea or shortness of breath Dermatological: negative for rash Respiratory: negative for cough or wheezing Urologic: negative for hematuria Abdominal: negative for nausea, vomiting, diarrhea, bright red blood per rectum, melena, or hematemesis Neurologic: negative for visual changes, syncope, or dizziness All other systems reviewed and are otherwise negative except as noted above.    Blood pressure 158/56, pulse 44, height 5' 6.5" (1.689 m), weight 140 lb 6 oz (63.674 kg).  General appearance: alert and no distress Neck: no adenopathy, no JVD, supple, symmetrical, trachea midline, thyroid not enlarged, symmetric, no tenderness/mass/nodules and ilateral carotid bruits Lungs: clear to auscultation bilaterally Heart: regular rate and rhythm, S1, S2 normal, no murmur, click, rub or gallop Extremities: extremities normal, atraumatic, no cyanosis or edema  EKG sinus bradycardia of 44 with evidence of LVH with repolarization changes. There were inferior Q waves noted. I personally reviewed this EKG  ASSESSMENT AND PLAN:   Hyperlipidemia History of hyperlipidemia on statin therapy with recent lipid profile performed 12/07/15 revealed a total cholesterol 117, LDL 49 and HDL of 54.  Coronary atherosclerosis History of coronary artery disease status post coronary artery bypass grafting in 2004 by [name] . He had a LIMA to his LAD, RIMA to the circumflex marginal branch vein to the distal RCA. He had a Myoview stress test performed 08/23/12 which was low risk and nonischemic. He denies chest pain or shortness of breath.  Essential  hypertension History of hypertension with blood pressure measured 158/56. He is on losartan, hydrochlorothiazide and metoprolol as well as nifedipine. Continued current meds at current dosing  Carotid artery disease History of carotid artery disease status post left carotid endarterectomy performed by Dr. Deon Pilling in 2006. We've been following this by duplex ultrasound which most recently performed 07/14/15 revealed mild right internal carotid artery stenosis with mild to moderateleft. He remains neurologically asymptomatic. We are following this every 6 months.  Claudication History of peripheral arterial disease status post left to right femorofemoral crossover graft by [name] May 2000. Because of claudication by angiogram him in May 2015 and ultimately revascularized him 05/23/14. I performed diamond back orbital rotation left rectum E, drug-eluting angioplasty of a high-grade calcified mid left SFA stenosis with stenting of the proximal left SFA. His most recent Dopplers performed 05/04/15 revealed a right ABI 0.9 and a left of 0.65. The intravenous that the segments appeared patent.      [name] MD FACP,FACC,FAHA, [facility] 03/07/2016 10:46 AM

## 2016-03-07 NOTE — Assessment & Plan Note (Signed)
History of coronary artery disease status post coronary artery bypass grafting in 2004 by [name] . He had a LIMA to his LAD, RIMA to the circumflex marginal branch vein to the distal RCA. He had a Myoview stress test performed 08/23/12 which was low risk and nonischemic. He denies chest pain or shortness of breath.

## 2016-03-11 ENCOUNTER — Other Ambulatory Visit: Payer: Self-pay | Admitting: Cardiovascular Disease

## 2016-03-11 NOTE — Telephone Encounter (Signed)
REFILL 

## 2016-03-18 ENCOUNTER — Other Ambulatory Visit: Payer: Self-pay | Admitting: Cardiovascular Disease

## 2016-03-18 NOTE — Telephone Encounter (Signed)
REFILL 

## 2016-03-21 DIAGNOSIS — Z961 Presence of intraocular lens: Secondary | ICD-10-CM | POA: Diagnosis not present

## 2016-03-21 DIAGNOSIS — H524 Presbyopia: Secondary | ICD-10-CM | POA: Diagnosis not present

## 2016-03-29 ENCOUNTER — Ambulatory Visit (INDEPENDENT_AMBULATORY_CARE_PROVIDER_SITE_OTHER): Payer: Medicare Other | Admitting: Family Medicine

## 2016-03-29 DIAGNOSIS — E538 Deficiency of other specified B group vitamins: Secondary | ICD-10-CM | POA: Diagnosis not present

## 2016-03-29 MED ORDER — CYANOCOBALAMIN 1000 MCG/ML IJ SOLN
1000.0000 ug | Freq: Once | INTRAMUSCULAR | Status: AC
  Administered 2016-03-29: 1000 ug via INTRAMUSCULAR

## 2016-04-09 ENCOUNTER — Other Ambulatory Visit: Payer: Self-pay | Admitting: Cardiovascular Disease

## 2016-04-09 DIAGNOSIS — I739 Peripheral vascular disease, unspecified: Secondary | ICD-10-CM

## 2016-04-12 ENCOUNTER — Other Ambulatory Visit: Payer: Self-pay | Admitting: Family Medicine

## 2016-04-19 ENCOUNTER — Other Ambulatory Visit: Payer: Self-pay | Admitting: Cardiovascular Disease

## 2016-04-19 DIAGNOSIS — I739 Peripheral vascular disease, unspecified: Secondary | ICD-10-CM

## 2016-04-25 ENCOUNTER — Telehealth: Payer: Self-pay | Admitting: *Deleted

## 2016-04-25 ENCOUNTER — Ambulatory Visit (HOSPITAL_COMMUNITY)
Admission: RE | Admit: 2016-04-25 | Discharge: 2016-04-25 | Disposition: A | Discharge status: Home / Self Care | Payer: Medicare Other | Source: Ambulatory Visit | Attending: Cardiovascular Disease | Admitting: Cardiovascular Disease

## 2016-04-25 ENCOUNTER — Other Ambulatory Visit: Payer: Self-pay | Admitting: Cardiovascular Disease

## 2016-04-25 DIAGNOSIS — I708 Atherosclerosis of other arteries: Secondary | ICD-10-CM | POA: Diagnosis not present

## 2016-04-25 DIAGNOSIS — I739 Peripheral vascular disease, unspecified: Secondary | ICD-10-CM | POA: Insufficient documentation

## 2016-04-25 DIAGNOSIS — I771 Stricture of artery: Secondary | ICD-10-CM | POA: Insufficient documentation

## 2016-04-25 DIAGNOSIS — I6529 Occlusion and stenosis of unspecified carotid artery: Secondary | ICD-10-CM

## 2016-04-25 DIAGNOSIS — I6523 Occlusion and stenosis of bilateral carotid arteries: Secondary | ICD-10-CM | POA: Insufficient documentation

## 2016-04-25 DIAGNOSIS — K219 Gastro-esophageal reflux disease without esophagitis: Secondary | ICD-10-CM | POA: Insufficient documentation

## 2016-04-25 DIAGNOSIS — I745 Embolism and thrombosis of iliac artery: Secondary | ICD-10-CM | POA: Insufficient documentation

## 2016-04-25 DIAGNOSIS — E785 Hyperlipidemia, unspecified: Secondary | ICD-10-CM | POA: Diagnosis not present

## 2016-04-25 DIAGNOSIS — R938 Abnormal findings on diagnostic imaging of other specified body structures: Secondary | ICD-10-CM | POA: Diagnosis not present

## 2016-04-25 DIAGNOSIS — I10 Essential (primary) hypertension: Secondary | ICD-10-CM | POA: Diagnosis not present

## 2016-04-25 DIAGNOSIS — I700 Atherosclerosis of aorta: Secondary | ICD-10-CM | POA: Diagnosis not present

## 2016-04-25 DIAGNOSIS — I779 Disorder of arteries and arterioles, unspecified: Secondary | ICD-10-CM | POA: Insufficient documentation

## 2016-04-25 DIAGNOSIS — I2510 Atherosclerotic heart disease of native coronary artery without angina pectoris: Secondary | ICD-10-CM | POA: Diagnosis not present

## 2016-04-25 NOTE — Telephone Encounter (Signed)
-----   Message from [name], MD sent at 04/25/2016 12:42 PM EDT ----- No change from prior study. Repeat in 12 months.

## 2016-05-03 ENCOUNTER — Ambulatory Visit (INDEPENDENT_AMBULATORY_CARE_PROVIDER_SITE_OTHER): Payer: Medicare Other | Admitting: Family Medicine

## 2016-05-03 DIAGNOSIS — E538 Deficiency of other specified B group vitamins: Secondary | ICD-10-CM | POA: Diagnosis not present

## 2016-05-03 MED ORDER — CYANOCOBALAMIN 1000 MCG/ML IJ SOLN
1000.0000 ug | Freq: Once | INTRAMUSCULAR | Status: AC
  Administered 2016-05-03: 1000 ug via INTRAMUSCULAR

## 2016-05-13 ENCOUNTER — Other Ambulatory Visit: Payer: Self-pay | Admitting: Cardiovascular Disease

## 2016-05-13 NOTE — Telephone Encounter (Signed)
metoprolol (LOPRESSOR) 50 MG tablet  Medication   Date: 04/12/2016  Department: [facility]  Ordering/Authorizing: [name], MD      Order Providers    Prescribing Provider Encounter Provider   [name], MD [name], MD    Medication Detail      Disp Refills Start End     metoprolol (LOPRESSOR) 50 MG tablet 30 tablet 5 04/12/2016     Sig: TAKE 1 TABLET BY MOUTH DAILY    E-Prescribing Status: Receipt confirmed by pharmacy (04/12/2016 1:51 PM EDT)     Pharmacy    [id number] - [place], [place] - [place]   Essential hypertension - [name], MD at 03/07/2016 10:42 AM     Status: Written Related Problem: Essential hypertension   Expand All Collapse All   History of hypertension with blood pressure measured 158/56. He is on losartan, hydrochlorothiazide and metoprolol as well as nifedipine. Continued current meds at current dosing       TREVONNE WINES  02/05/2016  Telephone  MRN:  [id number]   Description: 75 year old male  Provider: [name], MD  Department: [facility]       Reason for Call     Medication Refill         Call Documentation      Rudi Coco, RN at 02/05/2016 5:08 PM     Status: Signed       Expand All Collapse All   I sent script e-scribe and left a message for pt to call back and give some recent blood pressure readings.             Carolyn Stare at 02/05/2016 11:23 AM     Status: Signed       Expand All Collapse All   Pt request refill of the following: metoprolol (LOPRESSOR) 50 MG tablet  Pt said Dr Sarajane Jews told him start back taking 1 pill a day instead of a half pill and now he is out

## 2016-05-29 ENCOUNTER — Other Ambulatory Visit: Payer: Self-pay | Admitting: Cardiovascular Disease

## 2016-05-30 ENCOUNTER — Ambulatory Visit: Payer: Medicare Other | Admitting: Family Medicine

## 2016-05-30 NOTE — Telephone Encounter (Signed)
REFILL 

## 2016-05-31 ENCOUNTER — Ambulatory Visit (INDEPENDENT_AMBULATORY_CARE_PROVIDER_SITE_OTHER): Payer: Medicare Other | Admitting: Cardiovascular Disease

## 2016-05-31 ENCOUNTER — Other Ambulatory Visit: Payer: Self-pay | Admitting: *Deleted

## 2016-05-31 ENCOUNTER — Encounter: Payer: Self-pay | Admitting: Cardiovascular Disease

## 2016-05-31 ENCOUNTER — Ambulatory Visit (INDEPENDENT_AMBULATORY_CARE_PROVIDER_SITE_OTHER): Payer: Medicare Other | Admitting: Family Medicine

## 2016-05-31 VITALS — BP 162/63 | HR 49 | Ht 66.5 in | Wt 140.2 lb

## 2016-05-31 DIAGNOSIS — E785 Hyperlipidemia, unspecified: Secondary | ICD-10-CM

## 2016-05-31 DIAGNOSIS — I2510 Atherosclerotic heart disease of native coronary artery without angina pectoris: Secondary | ICD-10-CM

## 2016-05-31 DIAGNOSIS — Z01818 Encounter for other preprocedural examination: Secondary | ICD-10-CM | POA: Diagnosis not present

## 2016-05-31 DIAGNOSIS — D689 Coagulation defect, unspecified: Secondary | ICD-10-CM

## 2016-05-31 DIAGNOSIS — I739 Peripheral vascular disease, unspecified: Secondary | ICD-10-CM

## 2016-05-31 DIAGNOSIS — I6529 Occlusion and stenosis of unspecified carotid artery: Secondary | ICD-10-CM

## 2016-05-31 DIAGNOSIS — E538 Deficiency of other specified B group vitamins: Secondary | ICD-10-CM

## 2016-05-31 DIAGNOSIS — I779 Disorder of arteries and arterioles, unspecified: Secondary | ICD-10-CM

## 2016-05-31 DIAGNOSIS — I10 Essential (primary) hypertension: Secondary | ICD-10-CM

## 2016-05-31 LAB — CBC WITH DIFFERENTIAL/PLATELET
BASOS ABS: 0 {cells}/uL (ref 0–200)
BASOS PCT: 0 %
EOS ABS: 190 {cells}/uL (ref 15–500)
Eosinophils Relative: 2 %
HEMATOCRIT: 40.7 % (ref 38.5–50.0)
Hemoglobin: 13.8 g/dL (ref 13.2–17.1)
LYMPHS PCT: 24 %
Lymphs Abs: 2280 cells/uL (ref 850–3900)
MCH: 33.1 pg — ABNORMAL HIGH (ref 27.0–33.0)
MCHC: 33.9 g/dL (ref 32.0–36.0)
MCV: 97.6 fL (ref 80.0–100.0)
MONO ABS: 570 {cells}/uL (ref 200–950)
MPV: 10.9 fL (ref 7.5–12.5)
Monocytes Relative: 6 %
NEUTROS PCT: 68 %
Neutro Abs: 6460 cells/uL (ref 1500–7800)
Platelets: 156 10*3/uL (ref 140–400)
RBC: 4.17 MIL/uL — ABNORMAL LOW (ref 4.20–5.80)
RDW: 13.3 % (ref 11.0–15.0)
WBC: 9.5 10*3/uL (ref 3.8–10.8)

## 2016-05-31 LAB — APTT: aPTT: 29 s (ref 22–34)

## 2016-05-31 LAB — BASIC METABOLIC PANEL
BUN: 21 mg/dL (ref 7–25)
CHLORIDE: 105 mmol/L (ref 98–110)
CO2: 27 mmol/L (ref 20–31)
CREATININE: 1.23 mg/dL — AB (ref 0.70–1.18)
Calcium: 9.6 mg/dL (ref 8.6–10.3)
Glucose, Bld: 98 mg/dL (ref 65–99)
Potassium: 4.4 mmol/L (ref 3.5–5.3)
Sodium: 141 mmol/L (ref 135–146)

## 2016-05-31 LAB — PROTIME-INR
INR: 1
Prothrombin Time: 11.1 s (ref 9.0–11.5)

## 2016-05-31 MED ORDER — CYANOCOBALAMIN 1000 MCG/ML IJ SOLN
1000.0000 ug | Freq: Once | INTRAMUSCULAR | Status: AC
  Administered 2016-05-31: 1000 ug via INTRAMUSCULAR

## 2016-05-31 NOTE — Assessment & Plan Note (Signed)
History of CAD status post coronary artery bypass grafting in 2004 by Dr.[name]. he had a LIMA to his LAD, RIMA to the circumflex marginal graft to the RCA. Him IV stress test performed 08/23/12 which was low risk and nonischemic. He denies chest pain or shortness of breath.

## 2016-05-31 NOTE — Assessment & Plan Note (Signed)
History of peripheral arterial disease status post remote left to right femorofemoral cross over grafting by Dr. Drucie Opitz May 2000. I performed intervention on him 05/23/14 after a diagnostic angiogram performed 03/17/14 revealed a patent femorofemoral bypass graft, occluded right external iliac and high-grade disease in his proximal and mid left SFA. He underwent intervention on his SFA on 720 with an excellent result. I did place a Nitinol self expanding stent in his proximal left SFA. Post procedure Dopplers were essentially normal. Over the last 4 months he's had recurrent claudication on the left. His most recent Dopplers performed 04/25/16 revealed a decrease in his left ABI down to 0.4 with an occluded left SFA. I suspect he's had progressive in-stent restenosis. I will plan to restudy him.

## 2016-05-31 NOTE — Assessment & Plan Note (Signed)
History of carotid artery disease status post angiography by myself 03/17/14 revealing a 50% hypodense lesion in the right carotid bulb at 50% ostial left common carotid artery stenosis. His most recent carotid Dopplers performed 04/25/16 were stable. We'll continue to follow him noninvasively on annual basis.

## 2016-05-31 NOTE — Assessment & Plan Note (Signed)
History of hypertension blood pressure measured 162/63. He is on losartan, hydrochlorothiazide, metoprolol and Procardia. Continue current meds at current dosing

## 2016-05-31 NOTE — Assessment & Plan Note (Signed)
History of hyperlipidemia on statin therapy with recent lipid profile performed 12/07/15 revealed a total cholesterol 117, LDL 49 and HDL 54

## 2016-05-31 NOTE — Patient Instructions (Signed)
Medication Instructions:  Your physician recommends that you continue on your current medications as directed. Please refer to the Current Medication list given to you today.   Testing/Procedures: [name] has ordered a peripheral angiogram to be done at [facility].  This procedure is going to look at the bloodflow in your lower extremities.  If [name] is able to open up the arteries, you will have to spend one night in the hospital.  If he is not able to open the arteries, you will be able to go home that same day.    After the procedure, you will not be allowed to drive for 3 days or push, pull, or lift anything greater than 10 lbs for one week.    You will be required to have the following tests prior to the procedure:  1. Blood work-the blood work can be done no more than 14 days prior to the procedure.  It can be done at any [facility] lab.  There is one downstairs on the first floor of this building and one in the [facility] building [phone] N. [address])   [place]  Puncture site  RIGHT GROIN  If you need a refill on your cardiac medications before your next appointment, please call your pharmacy.

## 2016-05-31 NOTE — Progress Notes (Signed)
05/31/2016 Earl Castillo   April 13, 1941  [id number]  Primary Physician Earl Morale, MD Primary Cardiologist: Earl Harp MD Earl Castillo  HPI:  The patient is a 75 year old white male previously followed by Earl Castillo and now followed by myself. I last saw him in the office 03/07/16. The last time I saw him was at the time of his intervention in July. His past medical history is significant for CAD, status post coronary artery bypass grafting in 2004 by Earl Castillo, with a LIMA to his LAD, RIMA to the circumflex marginal and SVG to the distal RCA. He had a Myoview stress test performed 08/23/2012 which was low risk and nonischemic. He also has hypertension and hyperlipidemia. He has a 60+ year history of tobacco use and continues to smoke daily. He has significant peripheral vascular disease. He is status post right carotid endarterectomy performed by Earl Castillo in 2006. We have been following his carotid disease yearly by duplex ultrasound. He has also had a left to right fem-fem crossover graft by Dr. Erskin Castillo in May of 2000. He was recently seen by Dr. Gwenlyn Castillo and complained of worsening claudication. Recent lower extremity arterial Doppler studies demonstrated progression of disease in his left common and external iliac arteries. In addition, it was suggested that he had moderately severe distal left SFA disease. Subsequently, on 03/17/2014 he underwent a diagnostic PV angiogram performed by Dr. Gwenlyn Castillo. His fem-fem crossover graft was widely patent. His left iliac also appeared widely patent, suggesting falsely elevated velocities by duplex. He did have a high grade focal calcified mid to distal left SFA stenosis corresponding to an area of abnormality and high velocity in the duplex ultrasound with three-vessel runoff. I performed diamondback orbital rotational atherectomy of his mid left SFA through his femorofemoral crossover graft 05/23/14 with stenting of his proximal left SFA  as well. His subsequent Dopplers revealed an increase in his left ABI from 0.8-1.0 . His claudication has resolved. Since I saw him in the office 03/07/16 he denies chest pain. He does have fairly new onset left lower extremity claudication over the last several months with recent Dopplers that showed decline in his left ABI down to 0.4 with an occluded left SFA.  Current Outpatient Prescriptions  Medication Sig Dispense Refill  . acetaminophen (TYLENOL) 500 MG tablet Take 1,000 mg by mouth daily as needed (for leg pain).     Marland Kitchen aspirin EC 81 MG tablet Take 81 mg by mouth daily.    . clopidogrel (PLAVIX) 75 MG tablet Take 1 tablet (75 mg total) by mouth daily. 30 tablet 11  . cyanocobalamin (,VITAMIN B-12,) 1000 MCG/ML injection Inject 1,000 mcg into the muscle every 30 (thirty) days.     Marland Kitchen docusate sodium (COLACE) 100 MG capsule Take 100 mg by mouth daily as needed (for constipation).     . ferrous gluconate (FERGON) 324 MG tablet TAKE 1 TABLET BY MOUTH EVERY DAY WITH BREAKFAST 30 tablet 11  . losartan-hydrochlorothiazide (HYZAAR) 100-25 MG tablet Take 1 tablet by mouth daily. 30 tablet 11  . metoprolol (LOPRESSOR) 50 MG tablet TAKE 1 TABLET BY MOUTH DAILY 30 tablet 5  . neomycin-colistin-hydrocortisone-thonzonium (CORTISPORIN TC) 3.01-04-09-0.5 MG/ML otic suspension Place 4 drops into the left ear 4 (four) times daily as needed (for itching). 10 mL 0  . NIFEdipine (PROCARDIA XL/ADALAT-CC) 90 MG 24 hr tablet TAKE 1 TABLET BY MOUTH EVERY DAY 90 tablet 3  . simvastatin (ZOCOR) 20 MG tablet TAKE  1 TABLET BY MOUTH IN THE EVENING AFTER SUPPER 90 tablet 3   No current facility-administered medications for this visit.     No Known Allergies  Social History   Social History  . Marital status: Married    Spouse name: N/A  . Number of children: 2  . Years of education: N/A   Occupational History  .  Semi Retired Geophysicist/field seismologist    Social History Main Topics  . Smoking status: Current Some Day Smoker     Years: 58.00    Types: Cigarettes  . Smokeless tobacco: Never Used     Comment: 3 cigarettes every couple of days  . Alcohol use No  . Drug use: No  . Sexual activity: Not Currently   Other Topics Concern  . Not on file   Social History Narrative   Daily caffeine      Review of Systems: General: negative for chills, fever, night sweats or weight changes.  Cardiovascular: negative for chest pain, dyspnea on exertion, edema, orthopnea, palpitations, paroxysmal nocturnal dyspnea or shortness of breath Dermatological: negative for rash Respiratory: negative for cough or wheezing Urologic: negative for hematuria Abdominal: negative for nausea, vomiting, diarrhea, bright red blood per rectum, melena, or hematemesis Neurologic: negative for visual changes, syncope, or dizziness All other systems reviewed and are otherwise negative except as noted above.    Blood pressure (!) 162/63, pulse (!) 49, height 5' 6.5" (1.689 m), weight 140 lb 3.2 oz (63.6 kg).  General appearance: alert and no distress Neck: no adenopathy, no JVD, supple, symmetrical, trachea midline, thyroid not enlarged, symmetric, no tenderness/mass/nodules and Soft bilateral carotid bruits Lungs: clear to auscultation bilaterally Heart: regular rate and rhythm, S1, S2 normal, no murmur, click, rub or gallop Extremities: extremities normal, atraumatic, no cyanosis or edema  EKG not performed today  ASSESSMENT AND PLAN:   Hyperlipidemia History of hyperlipidemia on statin therapy with recent lipid profile performed 12/07/15 revealed a total cholesterol 117, LDL 49 and HDL 54  Coronary atherosclerosis History of CAD status post coronary artery bypass grafting in 2004 by EarlOwen. he had a LIMA to his LAD, RIMA to the circumflex marginal graft to the RCA. Him IV stress test performed 08/23/12 which was low risk and nonischemic. He denies chest pain or shortness of breath.  PVD- Lt SFA PTA/stent 05/23/14 History of  peripheral arterial disease status post remote left to right femorofemoral cross over grafting by Dr. Drucie Castillo May 2000. I performed intervention on him 05/23/14 after a diagnostic angiogram performed 03/17/14 revealed a patent femorofemoral bypass graft, occluded right external iliac and high-grade disease in his proximal and mid left SFA. He underwent intervention on his SFA on 720 with an excellent result. I did place a Nitinol self expanding stent in his proximal left SFA. Post procedure Dopplers were essentially normal. Over the last 4 months he's had recurrent claudication on the left. His most recent Dopplers performed 04/25/16 revealed a decrease in his left ABI down to 0.4 with an occluded left SFA. I suspect he's had progressive in-stent restenosis. I will plan to restudy him.  Essential hypertension History of hypertension blood pressure measured 162/63. He is on losartan, hydrochlorothiazide, metoprolol and Procardia. Continue current meds at current dosing  Carotid artery disease History of carotid artery disease status post angiography by myself 03/17/14 revealing a 50% hypodense lesion in the right carotid bulb at 50% ostial left common carotid artery stenosis. His most recent carotid Dopplers performed 04/25/16 were stable. We'll continue to follow  him noninvasively on annual basis.      Earl Harp MD FACP,FACC,FAHA, [facility] 05/31/2016 11:05 AM

## 2016-06-06 ENCOUNTER — Encounter (HOSPITAL_COMMUNITY)
Admission: RE | Disposition: A | Discharge status: Home / Self Care | Payer: Self-pay | Source: Ambulatory Visit | Attending: Cardiovascular Disease

## 2016-06-06 ENCOUNTER — Ambulatory Visit (HOSPITAL_COMMUNITY)
Admission: RE | Admit: 2016-06-06 | Discharge: 2016-06-06 | Disposition: A | Discharge status: Home / Self Care | Payer: Medicare Other | Source: Ambulatory Visit | Attending: Cardiovascular Disease | Admitting: Cardiovascular Disease

## 2016-06-06 ENCOUNTER — Encounter (HOSPITAL_COMMUNITY): Payer: Self-pay

## 2016-06-06 DIAGNOSIS — Z951 Presence of aortocoronary bypass graft: Secondary | ICD-10-CM | POA: Insufficient documentation

## 2016-06-06 DIAGNOSIS — E785 Hyperlipidemia, unspecified: Secondary | ICD-10-CM | POA: Insufficient documentation

## 2016-06-06 DIAGNOSIS — I2510 Atherosclerotic heart disease of native coronary artery without angina pectoris: Secondary | ICD-10-CM | POA: Insufficient documentation

## 2016-06-06 DIAGNOSIS — Z7982 Long term (current) use of aspirin: Secondary | ICD-10-CM | POA: Insufficient documentation

## 2016-06-06 DIAGNOSIS — I739 Peripheral vascular disease, unspecified: Secondary | ICD-10-CM | POA: Diagnosis present

## 2016-06-06 DIAGNOSIS — F17210 Nicotine dependence, cigarettes, uncomplicated: Secondary | ICD-10-CM | POA: Diagnosis not present

## 2016-06-06 DIAGNOSIS — I6523 Occlusion and stenosis of bilateral carotid arteries: Secondary | ICD-10-CM | POA: Insufficient documentation

## 2016-06-06 DIAGNOSIS — Z7902 Long term (current) use of antithrombotics/antiplatelets: Secondary | ICD-10-CM | POA: Insufficient documentation

## 2016-06-06 DIAGNOSIS — I10 Essential (primary) hypertension: Secondary | ICD-10-CM | POA: Insufficient documentation

## 2016-06-06 DIAGNOSIS — I70212 Atherosclerosis of native arteries of extremities with intermittent claudication, left leg: Secondary | ICD-10-CM | POA: Diagnosis not present

## 2016-06-06 HISTORY — PX: PERIPHERAL VASCULAR CATHETERIZATION: SHX172C

## 2016-06-06 SURGERY — LOWER EXTREMITY ANGIOGRAPHY
Anesthesia: LOCAL

## 2016-06-06 MED ORDER — MIDAZOLAM HCL 2 MG/2ML IJ SOLN
INTRAMUSCULAR | Status: AC
  Filled 2016-06-06: qty 2

## 2016-06-06 MED ORDER — HEPARIN (PORCINE) IN NACL 2-0.9 UNIT/ML-% IJ SOLN
INTRAMUSCULAR | Status: DC | PRN
  Administered 2016-06-06: 1000 mL

## 2016-06-06 MED ORDER — SODIUM CHLORIDE 0.9 % WEIGHT BASED INFUSION
3.0000 mL/kg/h | INTRAVENOUS | Status: AC
  Administered 2016-06-06: 3 mL/kg/h via INTRAVENOUS

## 2016-06-06 MED ORDER — ASPIRIN 81 MG PO CHEW
CHEWABLE_TABLET | ORAL | Status: AC
  Administered 2016-06-06: 81 mg via ORAL
  Filled 2016-06-06: qty 1

## 2016-06-06 MED ORDER — FENTANYL CITRATE (PF) 100 MCG/2ML IJ SOLN
INTRAMUSCULAR | Status: AC
  Filled 2016-06-06: qty 2

## 2016-06-06 MED ORDER — ASPIRIN 81 MG PO CHEW
81.0000 mg | CHEWABLE_TABLET | ORAL | Status: AC
Start: 2016-06-06 — End: 2016-06-06
  Administered 2016-06-06: 81 mg via ORAL

## 2016-06-06 MED ORDER — ACETAMINOPHEN 325 MG PO TABS: 650.0000 mg | ORAL_TABLET | ORAL | Status: DC | PRN

## 2016-06-06 MED ORDER — SODIUM CHLORIDE 0.9 % IV SOLN: INTRAVENOUS | Status: AC

## 2016-06-06 MED ORDER — IODIXANOL 320 MG/ML IV SOLN
INTRAVENOUS | Status: DC | PRN
  Administered 2016-06-06: 45 mL via INTRA_ARTERIAL

## 2016-06-06 MED ORDER — MORPHINE SULFATE (PF) 2 MG/ML IV SOLN: 2.0000 mg | INTRAVENOUS | Status: DC | PRN

## 2016-06-06 MED ORDER — FENTANYL CITRATE (PF) 100 MCG/2ML IJ SOLN
INTRAMUSCULAR | Status: DC | PRN
  Administered 2016-06-06: 25 ug via INTRAVENOUS

## 2016-06-06 MED ORDER — LIDOCAINE HCL (PF) 1 % IJ SOLN
INTRAMUSCULAR | Status: AC
  Filled 2016-06-06: qty 30

## 2016-06-06 MED ORDER — MIDAZOLAM HCL 2 MG/2ML IJ SOLN
INTRAMUSCULAR | Status: DC | PRN
  Administered 2016-06-06: 1 mg via INTRAVENOUS

## 2016-06-06 MED ORDER — NITROGLYCERIN 1 MG/10 ML FOR IR/CATH LAB
INTRA_ARTERIAL | Status: AC
  Filled 2016-06-06: qty 10

## 2016-06-06 MED ORDER — HEPARIN (PORCINE) IN NACL 2-0.9 UNIT/ML-% IJ SOLN
INTRAMUSCULAR | Status: AC
  Filled 2016-06-06: qty 1000

## 2016-06-06 MED ORDER — LIDOCAINE HCL (PF) 1 % IJ SOLN
INTRAMUSCULAR | Status: DC | PRN
  Administered 2016-06-06: 25 mL

## 2016-06-06 MED ORDER — ASPIRIN EC 325 MG PO TBEC: 325.0000 mg | DELAYED_RELEASE_TABLET | Freq: Every day | ORAL | Status: DC

## 2016-06-06 MED ORDER — SODIUM CHLORIDE 0.9 % WEIGHT BASED INFUSION: 1.0000 mL/kg/h | INTRAVENOUS | Status: DC

## 2016-06-06 MED ORDER — ONDANSETRON HCL 4 MG/2ML IJ SOLN: 4.0000 mg | Freq: Four times a day (QID) | INTRAMUSCULAR | Status: DC | PRN

## 2016-06-06 MED ORDER — SODIUM CHLORIDE 0.9% FLUSH: 3.0000 mL | INTRAVENOUS | Status: DC | PRN

## 2016-06-06 SURGICAL SUPPLY — 8 items
CATH ANGIO 5F PIGTAIL 65CM (CATHETERS) IMPLANT
KIT MICROINTRODUCER STIFF 5F (SHEATH) ×2 IMPLANT
KIT PV (KITS) ×2 IMPLANT
SHEATH PINNACLE 5F 10CM (SHEATH) ×2 IMPLANT
SYRINGE MEDRAD AVANTA MACH 7 (SYRINGE) ×2 IMPLANT
TRANSDUCER W/STOPCOCK (MISCELLANEOUS) ×2 IMPLANT
TRAY PV CATH (CUSTOM PROCEDURE TRAY) ×2 IMPLANT
WIRE HITORQ VERSACORE ST 145CM (WIRE) ×2 IMPLANT

## 2016-06-06 NOTE — Discharge Instructions (Signed)
Angiogram, Care After °Refer to this sheet in the next few weeks. These instructions provide you with information about caring for yourself after your procedure. Your health care provider may also give you more specific instructions. Your treatment has been planned according to current medical practices, but problems sometimes occur. Call your health care provider if you have any problems or questions after your procedure. °WHAT TO EXPECT AFTER THE PROCEDURE °After your procedure, it is typical to have the following: °· Bruising at the catheter insertion site that usually fades within 1-2 weeks. °· Blood collecting in the tissue (hematoma) that may be painful to the touch. It should usually decrease in size and tenderness within 1-2 weeks. °HOME CARE INSTRUCTIONS °· Take medicines only as directed by your health care provider. °· You may shower 24-48 hours after the procedure or as directed by your health care provider. Remove the bandage (dressing) and gently wash the site with plain soap and water. Pat the area dry with a clean towel. Do not rub the site, because this may cause bleeding. °· Do not take baths, swim, or use a hot tub until your health care provider approves. °· Check your insertion site every day for redness, swelling, or drainage. °· Do not apply powder or lotion to the site. °· Do not lift over 10 lb (4.5 kg) for 5 days after your procedure or as directed by your health care provider. °· Ask your health care provider when it is okay to: °¨ Return to work or school. °¨ Resume usual physical activities or sports. °¨ Resume sexual activity. °· Do not drive home if you are discharged the same day as the procedure. Have someone else drive you. °· You may drive 24 hours after the procedure unless otherwise instructed by your health care provider. °· Do not operate machinery or power tools for 24 hours after the procedure or as directed by your health care provider. °· If your procedure was done as an  outpatient procedure, which means that you went home the same day as your procedure, a responsible adult should be with you for the first 24 hours after you arrive home. °· Keep all follow-up visits as directed by your health care provider. This is important. °SEEK MEDICAL CARE IF: °· You have a fever. °· You have chills. °· You have increased bleeding from the catheter insertion site. Hold pressure on the site.  CALL 911 °SEEK IMMEDIATE MEDICAL CARE IF: °· You have unusual pain at the catheter insertion site. °· You have redness, warmth, or swelling at the catheter insertion site. °· You have drainage (other than a small amount of blood on the dressing) from the catheter insertion site. °· The catheter insertion site is bleeding, and the bleeding does not stop after 30 minutes of holding steady pressure on the site. °· The area near or just beyond the catheter insertion site becomes pale, cool, tingly, or numb. °  °This information is not intended to replace advice given to you by your health care provider. Make sure you discuss any questions you have with your health care provider. °  °Document Released: 05/09/2005 Document Revised: 11/11/2014 Document Reviewed: 03/24/2013 °Elsevier Interactive Patient Education ©2016 [employer] ° °

## 2016-06-06 NOTE — Progress Notes (Signed)
Site area: R groin area Site Prior to Removal:  Level 0 Pressure Applied For:32min Manual:  yes  Patient Status During Pull: stable  Post Pull Site:  Level Post Pull Instructions Given: yes  Post Pull Pulses Present: palpable Dressing Applied:  tegaderm Bedrest begins @ 1400 till 1800 Comments:

## 2016-06-06 NOTE — Interval H&P Note (Signed)
History and Physical Interval Note:  06/06/2016 12:45 PM  Earl Castillo  has presented today for surgery, with the diagnosis of pvd  The various methods of treatment have been discussed with the patient and family. After consideration of risks, benefits and other options for treatment, the patient has consented to  Procedure(s): Lower Extremity Angiography (N/A) Abdominal Aortogram (N/A) as a surgical intervention .  The patient's history has been reviewed, patient examined, no change in status, stable for surgery.  I have reviewed the patient's chart and labs.  Questions were answered to the patient's satisfaction.     Quay Burow

## 2016-06-06 NOTE — H&P (View-Only) (Signed)
05/31/2016 Earl Castillo   1941-09-30  UD:[id number]  Primary Physician Earl Morale, MD Primary Cardiologist: Earl Harp MD Earl Castillo  HPI:  The patient is a 75 year old white male previously followed by Earl Castillo and now followed by myself. I last saw him in the office 03/07/16. The last time I saw him was at the time of his intervention in July. His past medical history is significant for CAD, status post coronary artery bypass grafting in 2004 by Dr. Roxy Castillo, with a LIMA to his LAD, RIMA to the circumflex marginal and SVG to the distal RCA. He had a Myoview stress test performed 08/23/2012 which was low risk and nonischemic. He also has hypertension and hyperlipidemia. He has a 60+ year history of tobacco use and continues to smoke daily. He has significant peripheral vascular disease. He is status post right carotid endarterectomy performed by Earl Castillo in 2006. We have been following his carotid disease yearly by duplex ultrasound. He has also had a left to right fem-fem crossover graft by Earl Castillo in May of 2000. He was recently seen by Earl Castillo and complained of worsening claudication. Recent lower extremity arterial Doppler studies demonstrated progression of disease in his left common and external iliac arteries. In addition, it was suggested that he had moderately severe distal left SFA disease. Subsequently, on 03/17/2014 he underwent a diagnostic PV angiogram performed by Earl Castillo. His fem-fem crossover graft was widely patent. His left iliac also appeared widely patent, suggesting falsely elevated velocities by duplex. He did have a high grade focal calcified mid to distal left SFA stenosis corresponding to an area of abnormality and high velocity in the duplex ultrasound with three-vessel runoff. I performed diamondback orbital rotational atherectomy of his mid left SFA through his femorofemoral crossover graft 05/23/14 with stenting of his proximal left SFA  as well. His subsequent Dopplers revealed an increase in his left ABI from 0.8-1.0 . His claudication has resolved. Since I saw him in the office 03/07/16 he denies chest pain. He does have fairly new onset left lower extremity claudication over the last several months with recent Dopplers that showed decline in his left ABI down to 0.4 with an occluded left SFA.  Current Outpatient Prescriptions  Medication Sig Dispense Refill  . acetaminophen (TYLENOL) 500 MG tablet Take 1,000 mg by mouth daily as needed (for leg pain).     Marland Kitchen aspirin EC 81 MG tablet Take 81 mg by mouth daily.    . clopidogrel (PLAVIX) 75 MG tablet Take 1 tablet (75 mg total) by mouth daily. 30 tablet 11  . cyanocobalamin (,VITAMIN B-12,) 1000 MCG/ML injection Inject 1,000 mcg into the muscle every 30 (thirty) days.     Marland Kitchen docusate sodium (COLACE) 100 MG capsule Take 100 mg by mouth daily as needed (for constipation).     . ferrous gluconate (FERGON) 324 MG tablet TAKE 1 TABLET BY MOUTH EVERY DAY WITH BREAKFAST 30 tablet 11  . losartan-hydrochlorothiazide (HYZAAR) 100-25 MG tablet Take 1 tablet by mouth daily. 30 tablet 11  . metoprolol (LOPRESSOR) 50 MG tablet TAKE 1 TABLET BY MOUTH DAILY 30 tablet 5  . neomycin-colistin-hydrocortisone-thonzonium (CORTISPORIN TC) 3.01-04-09-0.5 MG/ML otic suspension Place 4 drops into the left ear 4 (four) times daily as needed (for itching). 10 mL 0  . NIFEdipine (PROCARDIA XL/ADALAT-CC) 90 MG 24 hr tablet TAKE 1 TABLET BY MOUTH EVERY DAY 90 tablet 3  . simvastatin (ZOCOR) 20 MG tablet TAKE  1 TABLET BY MOUTH IN THE EVENING AFTER SUPPER 90 tablet 3   No current facility-administered medications for this visit.     No Known Allergies  Social History   Social History  . Marital status: Married    Spouse name: N/A  . Number of children: 2  . Years of education: N/A   Occupational History  .  Semi Retired Geophysicist/field seismologist    Social History Main Topics  . Smoking status: Current Some Day Smoker     Years: 58.00    Types: Cigarettes  . Smokeless tobacco: Never Used     Comment: 3 cigarettes every couple of days  . Alcohol use No  . Drug use: No  . Sexual activity: Not Currently   Other Topics Concern  . Not on file   Social History Narrative   Daily caffeine      Review of Systems: General: negative for chills, fever, night sweats or weight changes.  Cardiovascular: negative for chest pain, dyspnea on exertion, edema, orthopnea, palpitations, paroxysmal nocturnal dyspnea or shortness of breath Dermatological: negative for rash Respiratory: negative for cough or wheezing Urologic: negative for hematuria Abdominal: negative for nausea, vomiting, diarrhea, bright red blood per rectum, melena, or hematemesis Neurologic: negative for visual changes, syncope, or dizziness All other systems reviewed and are otherwise negative except as noted above.    Blood pressure (!) 162/63, pulse (!) 49, height 5' 6.5" (1.689 m), weight 140 lb 3.2 oz (63.6 kg).  General appearance: alert and no distress Neck: no adenopathy, no JVD, supple, symmetrical, trachea midline, thyroid not enlarged, symmetric, no tenderness/mass/nodules and Soft bilateral carotid bruits Lungs: clear to auscultation bilaterally Heart: regular rate and rhythm, S1, S2 normal, no murmur, click, rub or gallop Extremities: extremities normal, atraumatic, no cyanosis or edema  EKG not performed today  ASSESSMENT AND PLAN:   Hyperlipidemia History of hyperlipidemia on statin therapy with recent lipid profile performed 12/07/15 revealed a total cholesterol 117, LDL 49 and HDL 54  Coronary atherosclerosis History of CAD status post coronary artery bypass grafting in 2004 by EarlOwen. he had a LIMA to his LAD, RIMA to the circumflex marginal graft to the RCA. Him IV stress test performed 08/23/12 which was low risk and nonischemic. He denies chest pain or shortness of breath.  PVD- Lt SFA PTA/stent 05/23/14 History of  peripheral arterial disease status post remote left to right femorofemoral cross over grafting by Dr. Drucie Opitz May 2000. I performed intervention on him 05/23/14 after a diagnostic angiogram performed 03/17/14 revealed a patent femorofemoral bypass graft, occluded right external iliac and high-grade disease in his proximal and mid left SFA. He underwent intervention on his SFA on 720 with an excellent result. I did place a Nitinol self expanding stent in his proximal left SFA. Post procedure Dopplers were essentially normal. Over the last 4 months he's had recurrent claudication on the left. His most recent Dopplers performed 04/25/16 revealed a decrease in his left ABI down to 0.4 with an occluded left SFA. I suspect he's had progressive in-stent restenosis. I will plan to restudy him.  Essential hypertension History of hypertension blood pressure measured 162/63. He is on losartan, hydrochlorothiazide, metoprolol and Procardia. Continue current meds at current dosing  Carotid artery disease History of carotid artery disease status post angiography by myself 03/17/14 revealing a 50% hypodense lesion in the right carotid bulb at 50% ostial left common carotid artery stenosis. His most recent carotid Dopplers performed 04/25/16 were stable. We'll continue to follow  him noninvasively on annual basis.      Earl Harp MD FACP,FACC,FAHA, [facility], [place] 05/31/2016 11:05 AM

## 2016-06-06 NOTE — Progress Notes (Signed)
Received report, accepted care.

## 2016-06-07 ENCOUNTER — Encounter (HOSPITAL_COMMUNITY): Payer: Self-pay | Admitting: Cardiovascular Disease

## 2016-06-26 ENCOUNTER — Ambulatory Visit (INDEPENDENT_AMBULATORY_CARE_PROVIDER_SITE_OTHER): Payer: Medicare Other | Admitting: Physician Assistant

## 2016-06-26 ENCOUNTER — Encounter: Payer: Self-pay | Admitting: Physician Assistant

## 2016-06-26 VITALS — BP 160/57 | HR 50 | Ht 66.5 in | Wt 140.4 lb

## 2016-06-26 DIAGNOSIS — I10 Essential (primary) hypertension: Secondary | ICD-10-CM | POA: Diagnosis not present

## 2016-06-26 DIAGNOSIS — Z720 Tobacco use: Secondary | ICD-10-CM

## 2016-06-26 DIAGNOSIS — I25810 Atherosclerosis of coronary artery bypass graft(s) without angina pectoris: Secondary | ICD-10-CM

## 2016-06-26 DIAGNOSIS — I739 Peripheral vascular disease, unspecified: Secondary | ICD-10-CM

## 2016-06-26 DIAGNOSIS — E785 Hyperlipidemia, unspecified: Secondary | ICD-10-CM | POA: Diagnosis not present

## 2016-06-26 DIAGNOSIS — I6529 Occlusion and stenosis of unspecified carotid artery: Secondary | ICD-10-CM

## 2016-06-26 MED ORDER — ISOSORBIDE MONONITRATE ER 30 MG PO TB24: 30.0000 mg | ORAL_TABLET | Freq: Every day | ORAL | 11 refills | Status: DC

## 2016-06-26 NOTE — Progress Notes (Signed)
Cardiology Office Note    Date:  06/26/2016   ID:  Earl Castillo Jun 18, 1941, MRN [id number]  PCP:  [name], MD  Cardiologist:  [name]   Chief Complaint  Patient presents with  . Hospitalization Follow-up    seen for [name]    History of Present Illness:  Earl Castillo is a 75 y.o. male with PMH of HTN, HLD, CAD s/p CABG 2004 with low risk myoview 2013, and PAD. He underwent CABG in 2004. [name] with LIMA to LAD, RIMA to left circumflex marginal, SVG to distal RCA. Last Myoview performed on 08/23/2012 was low risk and nonischemic. He had 60+ years of smoking history and the continued to smoke on a daily basis. He has significant peripheral vascular disease status post right carotid endarterectomy performed by [name] in 2006. He has been having yearly carotid duplex. He also had left to right femorofemoral bypass graft by [name] in May 2000.   He underwent diagnostic lower extremity angiography on 03/17/2014, his femorofemoral crossover graft was widely patent, his left iliac appears to be widely patent, suggesting falsely elevated velocity by Doppler. He did have high-grade focal calcification in the mid to distal left SFA corresponding to area of abnormality in the high velocity in the duplex ultrasound with 3 vessel runoff. He underwent diamondback orbital rotational atherectomy of his left mid SFA through his femorofemoral crossover graft on 05/23/14 with stenting. His subsequent Doppler showed increase of left ABI from 0.8 to 1.0. He was seen in the clinic in May 2017 at which time he denies any chest pain however he has new onset of left lower extremity claudication symptoms. Recent Doppler showed a decline in his left ABI to 0.4 with occluded left SFA. He underwent repeat lower extremity angiography on 06/06/2016 which continued to show widely patent left-to-right femorofemoral bypass graft, left SFA however is occluded at its origin at the site of  previously placed stent with reconstitution in a heavily calcified midportion by profunda femoris collateral with 3 vessel runoff. It was felt the best option would be femoral-popliteal bypass grafting.  He has been doing relatively well since recent procedure. He continued to have left lower extremity pain with walking greater than 50 yard. He lives with his wife and his son. Otherwise he denies any chest discomfort. His cath site above the right groin is stable. I have reviewed recent procedure with [name], lower extremity angiography was performed by sticking his previous femorofemoral bypass graft. [name] recommended referral to Dr. [name] for consideration of left femoropopliteal bypass surgery. His blood pressure continued to be high, on further review of his previous visit, his blood pressure has been ranging in the 150 to 160s in the last 2 months, he's be maxed out on the nifedipine and Hyzaar, and I am unable to increase his metoprolol dose due to baseline bradycardia. I will add 30 mg daily of Imdur. He will see [name] back in 1 to 2 month.   Past Medical History:  Diagnosis Date  . Anemia   . Angiodysplasia of intestine - small bowel  09/21/2013  . Blood transfusion abn reaction or complication, no procedure mishap    "HgB dropped real low"  . Carotid artery disease ([place])    status post carotid endarterectomy by [name]  . Chicken pox   . Colon polyps 08/2012   Hyperplastic and adenomatous sessile polyps  . Coronary artery disease  CABG'04, Low risk Myoview 2013  . GERD (gastroesophageal reflux disease)   . Hemorrhoid   . History of blood transfusion 08/2013   "HgB dropped real low; related to bleeding from my rectum"  . Hyperlipidemia   . Hypertension   . 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 Lower GI bleeding 08/2013  . Nephrolithiasis    "passed them most of the time"  . Osteoarthritis     "hips" (05/23/2014)  . Peripheral arterial disease ([place])    status post left to right fem-fem crossover graft in by [name]  . Sinus headache    "alot" (05/23/2014)  . Skin exam, screening for cancer    sees [name]   . Vitamin B12 deficiency 11/23/2013    Past Surgical History:  Procedure Laterality Date  . BAND HEMORRHOIDECTOMY    . BASAL CELL CARCINOMA EXCISION  May 2009   per [name] , left nose   . basket extraction of ureteral stone  1970's  . Bypass rt leg Right 2000  . capsule endoscopy  10-05-13   per [name], 4 AVM's  . CARDIAC CATHETERIZATION  08/18/2003   Recommend CABG  . CARDIOVASCULAR STRESS TEST - LEXISCAN  08/25/2012   No significant wall motion abnormalities  . CAROTID ANGIOGRAM N/A 03/17/2014   Procedure: CAROTID ANGIOGRAM;  Surgeon: [name], MD;  Location: [facility] CATH LAB;  Service: Cardiovascular;  Laterality: N/A;  right carotid  . CAROTID ENDARTERECTOMY Right   . CAROTID ENDARTERECTOMY Right 03/26/2005   Dr Georgina Quint  . CATARACT EXTRACTION W/ INTRAOCULAR LENS  IMPLANT, BILATERAL Bilateral 2014   per [name]  . [place]   "bulging disc"  . COLONOSCOPY  08-12-12   per [name], tubular adenomas, repeat in 5 yrs   . CORONARY ARTERY BYPASS GRAFT  08/30/2003   x3. LIMA-distal LAD, RIMA-circumflex, SVG-distal RCA  . CYSTOSCOPY W/ STONE MANIPULATION    . ESOPHAGOGASTRODUODENOSCOPY N/A 08/17/2013   Procedure: ESOPHAGOGASTRODUODENOSCOPY (EGD);  Surgeon: [name], MD;  Location: [facility] ENDOSCOPY;  Service: Endoscopy;  Laterality: N/A;  . FEMORAL ARTERY STENT Left 05/23/2014  . FEMORAL-FEMORAL BYPASS GRAFT  2003  . LOWER EXTREMITY ANGIOGRAM N/A 03/17/2014   Procedure: LOWER EXTREMITY ANGIOGRAM;  Surgeon: [name], MD;  Location: [facility] CATH LAB;  Service: Cardiovascular;  Laterality: N/A;  . PERIPHERAL VASCULAR CATHETERIZATION N/A 06/06/2016   Procedure: Lower Extremity Angiography;  Surgeon: [name], MD;  Location: [place] CV LAB;  Service: Cardiovascular;  Laterality: N/A;  . PERIPHERAL VASCULAR CATHETERIZATION N/A 06/06/2016   Procedure: Abdominal Aortogram;  Surgeon: [name], MD;  Location: [place] CV LAB;  Service: Cardiovascular;  Laterality: N/A;  . RENAL DUPLEX Bilateral 02/02/2013   Bilateral renal arteries demonstrate narrowingconsistent with a 60-99% diameter reduction.  . TRANSTHORACIC ECHOCARDIOGRAM  08/02/2013   EF 55-60%, normal echocardiogram    Current Medications: Outpatient Medications Prior to Visit  Medication Sig Dispense Refill  . acetaminophen (TYLENOL) 500 MG tablet Take 1,000 mg by mouth daily as needed (for leg pain).     Marland Kitchen aspirin EC 81 MG tablet Take 81 mg by mouth daily.    . clopidogrel (PLAVIX) 75 MG tablet Take 1 tablet (75 mg total) by mouth daily. 30 tablet 11  . cyanocobalamin (,VITAMIN B-12,) 1000 MCG/ML injection Inject 1,000 mcg into the muscle every 30 (thirty) days.     Marland Kitchen docusate sodium (  COLACE) 100 MG capsule Take 100 mg by mouth daily as needed (for constipation).     . ferrous gluconate (FERGON) 324 MG tablet TAKE 1 TABLET BY MOUTH EVERY DAY WITH BREAKFAST 30 tablet 11  . losartan-hydrochlorothiazide (HYZAAR) 100-25 MG tablet Take 1 tablet by mouth daily. 30 tablet 11  . metoprolol (LOPRESSOR) 50 MG tablet TAKE 1 TABLET BY MOUTH DAILY 30 tablet 5  . neomycin-colistin-hydrocortisone-thonzonium (CORTISPORIN TC) 3.01-04-09-0.5 MG/ML otic suspension Place 4 drops into the left ear 4 (four) times daily as needed (for itching). 10 mL 0  . NIFEdipine (PROCARDIA XL/ADALAT-CC) 90 MG 24 hr tablet TAKE 1 TABLET BY MOUTH EVERY DAY 90 tablet 3  . simvastatin (ZOCOR) 20 MG tablet TAKE 1 TABLET BY MOUTH IN THE EVENING AFTER SUPPER 90 tablet 3   No facility-administered medications prior to visit.      Allergies:   Review of patient's allergies indicates no known allergies.   Social History   Social History  . Marital status: Married     Spouse name: N/A  . Number of children: 2  . Years of education: N/A   Occupational History  .  Semi Retired Geophysicist/field seismologist    Social History Main Topics  . Smoking status: Current Some Day Smoker    Years: 58.00    Types: Cigarettes  . Smokeless tobacco: Never Used     Comment: 3 cigarettes every couple of days  . Alcohol use No  . Drug use: No  . Sexual activity: Not Currently   Other Topics Concern  . None   Social History Narrative   Daily caffeine      Family History:  The patient's family history includes Coronary artery disease in his brother, brother, and sister; Heart disease in his father.   ROS:   Please see the history of present illness.    ROS All other systems reviewed and are negative.   PHYSICAL EXAM:   VS:  BP (!) 160/57 (BP Location: Left Arm, Patient Position: Sitting, Cuff Size: Normal)   Pulse (!) 50   Ht 5' 6.5" (1.689 m)   Wt 140 lb 6 oz (63.7 kg)   BMI 22.32 kg/m    GEN: Well nourished, well developed, in no acute distress  HEENT: normal  Neck: no JVD, carotid bruits, or masses Cardiac: RRR; no murmurs, rubs, or gallops,no edema  Respiratory:  clear to auscultation bilaterally, normal work of breathing GI: soft, nontender, nondistended, + BS MS: no deformity or atrophy  Skin: warm and dry, no rash Neuro:  Alert and Oriented x 3, Strength and sensation are intact Psych: euthymic mood, full affect  Wt Readings from Last 3 Encounters:  06/26/16 140 lb 6 oz (63.7 kg)  06/06/16 140 lb (63.5 kg)  05/31/16 140 lb 3.2 oz (63.6 kg)      Studies/Labs Reviewed:   EKG:  EKG is not ordered today.    Recent Labs: 12/07/2015: ALT 9; TSH 1.25 05/31/2016: BUN 21; Creat 1.23; Hemoglobin 13.8; Platelets 156; Potassium 4.4; Sodium 141   Lipid Panel    Component Value Date/Time   CHOL 117 12/07/2015 1054   TRIG 67.0 12/07/2015 1054   HDL 54.10 12/07/2015 1054   CHOLHDL 2 12/07/2015 1054   VLDL 13.4 12/07/2015 1054   LDLCALC 49 12/07/2015 1054     Additional studies/ records that were reviewed today include:   LE angiography 06/06/2016 Procedures Performed:  1. Left lower extremity angiogram   1: Left lower extremity-the proximal anastomosis of the left to right femorofemoral bypass graft was widely patent. The left SFA is occluded at its origin at the site of the previous placed stent. The SFA reconstitutes in the midportion of the left SFA by profunda femoris collaterals. There was three-vessel runoff  IMPRESSION: Mr. Nita Sickle has an occluded left SFA at its origin. This was the site of previous orbital rotational atherectomy, PTA and supple extending stenting. The SFA reconstitutes in the highly calcified segment in the midportion with three-vessel runoff. I believe the best method of revascularization would be femoropopliteal bypass grafting. The sheath was removed and pressure held. The patient will be discharged home today for an outpatient will see back in the office in 2-3 weeks for follow-up.    ASSESSMENT:    1. PAD (peripheral artery disease) ([place])   2. Claudication ([place])   3. Coronary artery disease involving coronary bypass graft of native heart without angina pectoris   4. Essential hypertension   5. Hyperlipidemia   6. Tobacco abuse      PLAN:  In order of problems listed above:  1. PAD s/p previous L SFA stent in 2015, recently noticed to have recurrent claudication symptom with L ABI down to 0.4, LE aniography on 06/06/2016 showed occluded L SFA at the site of previously placed stent with reconstitution in a heavily calcified midportion by profunda femoris collateral with 3 vessel runoff. - Discussed with [name] today, will refer the patient to Dr. [name] for consideration of left femoropopliteal surgery. He still have claudications with ambulating 50 yards. Ultimately, he needs to stop smoking  2. CAD s/p CABG LIMA to LAD, RIMA to left circumflex marginal, SVG to distal RCA 2004 with  low risk myoview 2013: No angina symptoms  3. HTN: His blood pressure has been persistently elevated in the 150 to 160s range, will add 30 mg daily Imdur to his medical regimen.  4. HLD: Continue Zocor 20 mg daily, last lipid profile obtained in February 2017 showed cholesterol was 17, glucose of 67, HDL 54, LDL 49. Recheck in February 2018    Medication Adjustments/Labs and Tests Ordered: Current medicines are reviewed at length with the patient today.  Concerns regarding medicines are outlined above.  Medication changes, Labs and Tests ordered today are listed in the Patient Instructions below. Patient Instructions  Medication Instructions:  Your physician has recommended you make the following change in your medication:  START Imdur 30mg  daily. An Rx has been sent to your pharmacy   Labwork: None ordered  Testing/Procedures: None ordered  Follow-Up: Your physician recommends that you schedule a follow-up appointment in: 1-2 months with Dr.Berry  You have been referred to Vascular Vein Surgery Dr.Brabham     Any Other Special Instructions Will Be Listed Below (If Applicable).     If you need a refill on your cardiac medications before your next appointment, please call your pharmacy.      Hilbert Corrigan, [place]  06/26/2016 1:18 PM    [facility] [address], [place], [place]  [address] Phone: [phone]; Fax: [phone]

## 2016-06-26 NOTE — Patient Instructions (Signed)
Medication Instructions:  Your physician has recommended you make the following change in your medication:  START Imdur 30mg  daily. An Rx has been sent to your pharmacy   Labwork: None ordered  Testing/Procedures: None ordered  Follow-Up: Your physician recommends that you schedule a follow-up appointment in: 1-2 months with Dr.Berry  You have been referred to Vascular Vein Surgery Dr.Brabham     Any Other Special Instructions Will Be Listed Below (If Applicable).     If you need a refill on your cardiac medications before your next appointment, please call your pharmacy.

## 2016-06-28 ENCOUNTER — Ambulatory Visit (INDEPENDENT_AMBULATORY_CARE_PROVIDER_SITE_OTHER): Payer: Medicare Other | Admitting: Family Medicine

## 2016-06-28 DIAGNOSIS — E538 Deficiency of other specified B group vitamins: Secondary | ICD-10-CM

## 2016-06-28 DIAGNOSIS — Z23 Encounter for immunization: Secondary | ICD-10-CM

## 2016-06-28 MED ORDER — CYANOCOBALAMIN 1000 MCG/ML IJ SOLN
1000.0000 ug | Freq: Once | INTRAMUSCULAR | Status: AC
  Administered 2016-06-28: 1000 ug via INTRAMUSCULAR

## 2016-07-23 ENCOUNTER — Encounter: Payer: Self-pay | Admitting: Surgery

## 2016-07-29 ENCOUNTER — Other Ambulatory Visit: Payer: Self-pay

## 2016-07-29 ENCOUNTER — Encounter: Payer: Self-pay | Admitting: Surgery

## 2016-07-29 ENCOUNTER — Ambulatory Visit (HOSPITAL_COMMUNITY)
Admission: RE | Admit: 2016-07-29 | Discharge: 2016-07-29 | Disposition: A | Discharge status: Home / Self Care | Payer: Medicare Other | Source: Ambulatory Visit | Attending: Surgery | Admitting: Surgery

## 2016-07-29 ENCOUNTER — Ambulatory Visit (INDEPENDENT_AMBULATORY_CARE_PROVIDER_SITE_OTHER): Payer: Medicare Other | Admitting: Surgery

## 2016-07-29 VITALS — BP 158/73 | HR 54 | Temp 98.7°F | Resp 18 | Ht 66.5 in | Wt 138.0 lb

## 2016-07-29 DIAGNOSIS — I70212 Atherosclerosis of native arteries of extremities with intermittent claudication, left leg: Secondary | ICD-10-CM

## 2016-07-29 DIAGNOSIS — Z01810 Encounter for preprocedural cardiovascular examination: Secondary | ICD-10-CM

## 2016-07-29 DIAGNOSIS — I82811 Embolism and thrombosis of superficial veins of right lower extremities: Secondary | ICD-10-CM | POA: Diagnosis not present

## 2016-07-29 NOTE — Progress Notes (Signed)
[facility] [place]  Patient name: Earl Castillo MRN: [id number] DOB: Dec 15, 1940 Sex: male  REFERRING PHYSICIAN: [name]  REASON FOR CONSULT: left leg claudication  HPI: Earl Castillo is a 75 y.o. male, who is referred today for evaluation of left leg claudication.  The patient has a history of a left to right femoral-femoral bypass graft by [name] in 2000.  This was done for an occluded right common iliac artery.  He has undergone atherectomy and stenting on 05/23/2014.  Recently, his left superficial femoral artery has been found to be occluded.  The patient states he now has lifestyle limiting claudication at 50 yards.  He denies ulcers.  He would like something done to improve his walking.  The patient has previously undergone right carotid endarterectomy by Dr. Deon Pilling in 2006.  He is followed for bilateral carotid stenosis, most recently in June 2017 the left side was in the 60-79% range.  The patient is asymptomatic.  He has a history of coronary artery disease.  He is status post CABG in 2004.  Patient has extensive smoking history   Patient has a history of hypertension which is medically managed.  He takes a statin for hypercholesterolemia.  He is currently still smoking. Past Medical History:  Diagnosis Date  . Anemia   . Angiodysplasia of intestine - small bowel  09/21/2013  . Blood transfusion abn reaction or complication, no procedure mishap    "HgB dropped real low"  . Carotid artery disease ([place])    status post carotid endarterectomy by [name]  . Chicken pox   . Colon polyps 08/2012   Hyperplastic and adenomatous sessile polyps  . Coronary artery disease    CABG'04, Low risk Myoview 2013  . GERD (gastroesophageal reflux disease)   . Hemorrhoid   . History of blood transfusion 08/2013   "HgB dropped real low; related to bleeding from my rectum"  . Hyperlipidemia   . Hypertension   . 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 Lower GI bleeding 08/2013  . Nephrolithiasis    "passed them most of the time"  . Osteoarthritis    "hips" (05/23/2014)  . Peripheral arterial disease ([place])    status post left to right fem-fem crossover graft in by [name]  . Sinus headache    "alot" (05/23/2014)  . Skin exam, screening for cancer    sees [name]   . Vitamin B12 deficiency 11/23/2013    Family History  Problem Relation Age of Onset  . Heart disease Father   . Heart disease Sister   . Coronary artery disease Brother     CABG x 6  . Heart disease Brother   . Coronary artery disease Brother     first MI 21, died at 7 during cardiac surgery  . Coronary artery disease Sister     CABG x 5, RAS  . Heart disease Sister   . Colon cancer Neg Hx     SOCIAL HISTORY: Social History   Social History  . Marital status: Married    Spouse name: N/A  . Number of children: 2  . Years of education: N/A   Occupational History  .  Semi Retired Geophysicist/field seismologist    Social History Main Topics  . Smoking status: Current Some Day Smoker    Packs/day: 0.25    Years: 58.00    Types: Cigarettes  . Smokeless tobacco:  Never Used     Comment: 3 cigarettes every couple of days  . Alcohol use No  . Drug use: No  . Sexual activity: Not Currently   Other Topics Concern  . Not on file   Social History Narrative   Daily caffeine     No Known Allergies  Current Outpatient Prescriptions  Medication Sig Dispense Refill  . acetaminophen (TYLENOL) 500 MG tablet Take 1,000 mg by mouth daily as needed (for leg pain).     Marland Kitchen aspirin EC 81 MG tablet Take 81 mg by mouth daily.    . clopidogrel (PLAVIX) 75 MG tablet Take 1 tablet (75 mg total) by mouth daily. 30 tablet 11  . cyanocobalamin (,VITAMIN B-12,) 1000 MCG/ML injection Inject 1,000 mcg into the muscle every 30 (thirty) days.     Marland Kitchen docusate sodium (COLACE) 100 MG capsule Take 100  mg by mouth daily as needed (for constipation).     . ferrous gluconate (FERGON) 324 MG tablet TAKE 1 TABLET BY MOUTH EVERY DAY WITH BREAKFAST 30 tablet 11  . isosorbide mononitrate (IMDUR) 30 MG 24 hr tablet Take 1 tablet (30 mg total) by mouth daily. 30 tablet 11  . losartan-hydrochlorothiazide (HYZAAR) 100-25 MG tablet Take 1 tablet by mouth daily. 30 tablet 11  . metoprolol (LOPRESSOR) 50 MG tablet TAKE 1 TABLET BY MOUTH DAILY 30 tablet 5  . neomycin-colistin-hydrocortisone-thonzonium (CORTISPORIN TC) 3.01-04-09-0.5 MG/ML otic suspension Place 4 drops into the left ear 4 (four) times daily as needed (for itching). 10 mL 0  . NIFEdipine (PROCARDIA XL/ADALAT-CC) 90 MG 24 hr tablet TAKE 1 TABLET BY MOUTH EVERY DAY 90 tablet 3  . simvastatin (ZOCOR) 20 MG tablet TAKE 1 TABLET BY MOUTH IN THE EVENING AFTER SUPPER 90 tablet 3   No current facility-administered medications for this visit.     REVIEW OF SYSTEMS:  [X]  denotes positive finding, [ ]  denotes negative finding Cardiac  Comments:  Chest pain or chest pressure:    Shortness of breath upon exertion:    Short of breath when lying flat:    Irregular heart rhythm:        Vascular    Pain in calf, thigh, or hip brought on by ambulation: x   Pain in feet at night that wakes you up from your sleep:     Blood clot in your veins:    Leg swelling:         Pulmonary    Oxygen at home:    Productive cough:     Wheezing:         Neurologic    Sudden weakness in arms or legs:     Sudden numbness in arms or legs:     Sudden onset of difficulty speaking or slurred speech:    Temporary loss of vision in one eye:     Problems with dizziness:         Gastrointestinal    Blood in stool:     Vomited blood:         Genitourinary    Burning when urinating:     Blood in urine:        Psychiatric    Major depression:         Hematologic    Bleeding problems:    Problems with blood clotting too easily:        Skin    Rashes or ulcers:         Constitutional    Fever or  chills:      PHYSICAL EXAM: Vitals:   07/29/16 1059 07/29/16 1100  BP: (!) 160/73 (!) 158/73  Pulse: (!) 54   Resp: 18   Temp: 98.7 F (37.1 C)   TempSrc: Oral   SpO2: 96%   Weight: 138 lb (62.6 kg)   Height: 5' 6.5" (1.689 m)     GENERAL: The patient is a well-nourished male, in no acute distress. The vital signs are documented above. CARDIAC: There is a regular rate and rhythm.  VASCULAR: Nonpalpable pedal pulse in the left. PULMONARY: There is good air exchange bilaterally without wheezing or rales. ABDOMEN: Soft and non-tender with normal pitched bowel sounds.  MUSCULOSKELETAL: There are no major deformities or cyanosis. NEUROLOGIC: No focal weakness or paresthesias are detected. SKIN: There are no ulcers or rashes noted. PSYCHIATRIC: The patient has a normal affect.  DATA:  I have reviewed his arteriogram shows a patent left-to-right femoral-femoral bypass graft.  The left superficial femoral artery is occluded at its origin and reconstitutes in the adductor canal with three-vessel runoff.  There also appears to be a profunda femoral stenosis.   Vein mapping was ordered today.  He has a marginal saphenous vein on the right and it has been harvested on the left  ASSESSMENT AND PLAN: Left leg claudication: I discussed with the patient and his wife, the most appropriate next Would be to proceed with a left femoral to above-knee popliteal artery bypass graft using a Gore-Tex graft.  I do not think harvesting the vein out of his right leg is the appropriate next step given the diameter of the vein as well as the fact that it is on the contralateral leg.  I discussed risks of the operation including but not limited to leg swelling, incisional complications, graft infection, cardia Porter complications.  The patient would like to get this done as soon as possible.  I will stop his Plavix this Friday and scheduled his operation for Friday, October  6   Annamarie Major, MD [facility] [phone] Pager [phone]

## 2016-08-02 ENCOUNTER — Telehealth: Payer: Self-pay | Admitting: *Deleted

## 2016-08-02 ENCOUNTER — Other Ambulatory Visit: Payer: Self-pay

## 2016-08-02 ENCOUNTER — Ambulatory Visit (INDEPENDENT_AMBULATORY_CARE_PROVIDER_SITE_OTHER): Payer: Medicare Other | Admitting: Family Medicine

## 2016-08-02 DIAGNOSIS — E538 Deficiency of other specified B group vitamins: Secondary | ICD-10-CM | POA: Diagnosis not present

## 2016-08-02 MED ORDER — NIFEDIPINE ER OSMOTIC RELEASE 90 MG PO TB24: ORAL_TABLET | ORAL | 3 refills | Status: DC

## 2016-08-02 MED ORDER — CYANOCOBALAMIN 1000 MCG/ML IJ SOLN
1000.0000 ug | Freq: Once | INTRAMUSCULAR | Status: AC
  Administered 2016-08-02: 1000 ug via INTRAMUSCULAR

## 2016-08-02 NOTE — Telephone Encounter (Signed)
Spoke to patient. RN informed patient after reviewing 06/26/16 office note. Do not see any mention of increasing nifedipine to 2 tablets daily.  Patient states [place] PA informed him to double does of medication until bottle was empty and new prescription would be called into the pharmacy   RN informed patient will have to defer to [facility] for clarification and contact him back. Patient states he has dose of mediation for the morning only

## 2016-08-02 NOTE — Telephone Encounter (Signed)
Patient called and stated that he was instructed to double his dose of nifedipine at the 06/26/16 office visit with Almyra Deforest, PA. He states that he was told that an rx would be sent in with the new sig, but his pharmacy never received it. He requests that this to be done today and he would like a call at [phone] when it has been submitted to the pharmacy. Thanks, MI

## 2016-08-04 NOTE — Telephone Encounter (Signed)
His nifedipine XL was already at maximal dose, I did mention potentially increase his medication, but later we decided to add Imdur instead. He should still be at 90mg  daily. I will call him on Monday myself to check with him.

## 2016-08-05 ENCOUNTER — Other Ambulatory Visit: Payer: Self-pay | Admitting: Physician Assistant

## 2016-08-05 MED ORDER — NIFEDIPINE ER OSMOTIC RELEASE 90 MG PO TB24: ORAL_TABLET | ORAL | 3 refills | Status: DC

## 2016-08-05 NOTE — Progress Notes (Signed)
Called [name] today to followup, it seems he miss understood me during the last office visit, although I initially did think about doubling the dose of nifedipine, however later felt increase the medication beyond its maximum dose would not convey significant benefit to [name], we instead added Imdur 30mg  during last office. I have clarified this with [name] and asked him to monitor his BP and potentially adjust his BP further later.  Earl Corrigan PA Pager: [phone]

## 2016-08-07 ENCOUNTER — Other Ambulatory Visit: Payer: Self-pay

## 2016-08-07 ENCOUNTER — Encounter (HOSPITAL_COMMUNITY)
Admission: RE | Admit: 2016-08-07 | Discharge: 2016-08-07 | Disposition: A | Discharge status: Home / Self Care | Payer: Medicare Other | Source: Ambulatory Visit | Attending: Surgery | Admitting: Surgery

## 2016-08-07 ENCOUNTER — Encounter (HOSPITAL_COMMUNITY): Payer: Self-pay

## 2016-08-07 HISTORY — DX: Personal history of urinary calculi: Z87.442

## 2016-08-07 HISTORY — DX: Pneumonia, unspecified organism: J18.9

## 2016-08-07 HISTORY — DX: Malignant (primary) neoplasm, unspecified: C80.1

## 2016-08-07 LAB — COMPREHENSIVE METABOLIC PANEL
ALT: 12 U/L — AB (ref 17–63)
ANION GAP: 8 (ref 5–15)
AST: 19 U/L (ref 15–41)
Albumin: 3.4 g/dL — ABNORMAL LOW (ref 3.5–5.0)
Alkaline Phosphatase: 51 U/L (ref 38–126)
BUN: 20 mg/dL (ref 6–20)
CHLORIDE: 103 mmol/L (ref 101–111)
CO2: 27 mmol/L (ref 22–32)
CREATININE: 1.33 mg/dL — AB (ref 0.61–1.24)
Calcium: 9.6 mg/dL (ref 8.9–10.3)
GFR, EST AFRICAN AMERICAN: 59 mL/min — AB (ref >60)
GFR, EST NON AFRICAN AMERICAN: 51 mL/min — AB (ref >60)
Glucose, Bld: 103 mg/dL — ABNORMAL HIGH (ref 65–99)
Potassium: 3.7 mmol/L (ref 3.5–5.1)
Sodium: 138 mmol/L (ref 135–145)
Total Bilirubin: 0.7 mg/dL (ref 0.3–1.2)
Total Protein: 6.6 g/dL (ref 6.5–8.1)

## 2016-08-07 LAB — CBC
HCT: 40.4 % (ref 39.0–52.0)
HEMOGLOBIN: 13.4 g/dL (ref 13.0–17.0)
MCH: 32.9 pg (ref 26.0–34.0)
MCHC: 33.2 g/dL (ref 30.0–36.0)
MCV: 99.3 fL (ref 78.0–100.0)
Platelets: 174 10*3/uL (ref 150–400)
RBC: 4.07 MIL/uL — AB (ref 4.22–5.81)
RDW: 13.1 % (ref 11.5–15.5)
WBC: 10.6 10*3/uL — ABNORMAL HIGH (ref 4.0–10.5)

## 2016-08-07 LAB — URINALYSIS, ROUTINE W REFLEX MICROSCOPIC
Bilirubin Urine: NEGATIVE
GLUCOSE, UA: NEGATIVE mg/dL
HGB URINE DIPSTICK: NEGATIVE
KETONES UR: NEGATIVE mg/dL
LEUKOCYTES UA: NEGATIVE
Nitrite: NEGATIVE
PH: 6 (ref 5.0–8.0)
Protein, ur: 100 mg/dL — AB
Specific Gravity, Urine: 1.024 (ref 1.005–1.030)

## 2016-08-07 LAB — PROTIME-INR
INR: 1.11
PROTHROMBIN TIME: 14.3 s (ref 11.4–15.2)

## 2016-08-07 LAB — TYPE AND SCREEN
ABO/RH(D): B POS
Antibody Screen: NEGATIVE

## 2016-08-07 LAB — URINE MICROSCOPIC-ADD ON: RBC / HPF: NONE SEEN RBC/hpf (ref 0–5)

## 2016-08-07 LAB — SURGICAL PCR SCREEN
MRSA, PCR: NEGATIVE
Staphylococcus aureus: POSITIVE — AB

## 2016-08-07 LAB — APTT: aPTT: 33 seconds (ref 24–36)

## 2016-08-07 MED ORDER — SODIUM CHLORIDE 0.9 % IV SOLN: INTRAVENOUS | Status: DC

## 2016-08-07 NOTE — Progress Notes (Signed)
I called a prescription for Mupirocin ointment to [facility], [place], [place]

## 2016-08-07 NOTE — Pre-Procedure Instructions (Signed)
    Earl Castillo  08/07/2016     Your procedure is scheduled on Friday, October 6.  Report to [facility] Admitting at 5:30 AM                 Your surgery or procedure is scheduled for 7:30 AM   Call this number if you have problems the morning of surgery:[phone]                 For any other questions, please call [phone], Monday - Friday 8 AM - 4 PM.   Remember:  Do not eat food or drink liquids after midnight Thursday, October 5.  Take these medicines the morning of surgery with A SIP OF WATER : isosorbide mononitrate (IMDUR), metoprolol (LOPRESSOR), NIFEdipine (PROCARDIA XL/ADALAT-CC).                Aspirin and Plavix as instructed by Dr [name].                1 Week prior to surgery STOP taking  Aspirin Products (Goody Powder, Excedrin Migraine), Ibuprofen (Advil), Naproxen (Aleve), Vitamins and Herbal Products (ie Fish Oil).   Do not wear jewelry, make-up or nail polish.  Do not wear lotions, powders, or perfumes, or deodorant.   Men may shave face and neck.  Do not bring valuables to the hospital.  [facility] is not responsible for any belongings or valuables.  Contacts, dentures or bridgework may not be worn into surgery.  Leave your suitcase in the car.  After surgery it may be brought to your room.  For patients admitted to the hospital, discharge time will be determined by your treatment team.  Special instructions: Review  [place] - Preparing For Surgery.  Please read over the following fact sheets that you were given. [place]- Preparing For Surgery and Patient Instructions for Mupirocin Application, Coughing and deep Breathing, Pain Booklet

## 2016-08-08 ENCOUNTER — Encounter: Payer: Self-pay | Admitting: Cardiology

## 2016-08-08 NOTE — Anesthesia Preprocedure Evaluation (Addendum)
Anesthesia Evaluation  Patient identified by MRN, date of birth, ID band Patient awake    Reviewed: Allergy & Precautions, H&P , NPO status , Patient's Chart, lab work & pertinent test results, reviewed documented beta blocker date and time   Airway Mallampati: II  TM Distance: >3 FB Neck ROM: Full    Dental no notable dental hx. (+) Teeth Intact, Dental Advisory Given   Pulmonary Current Smoker,    Pulmonary exam normal breath sounds clear to auscultation       Cardiovascular hypertension, Pt. on medications and Pt. on home beta blockers + CAD, + CABG and + Peripheral Vascular Disease   Rhythm:Regular Rate:Normal     Neuro/Psych  Headaches, negative psych ROS   GI/Hepatic Neg liver ROS, GERD  Medicated and Controlled,  Endo/Other  negative endocrine ROS  Renal/GU negative Renal ROS  negative genitourinary   Musculoskeletal  (+) Arthritis , Osteoarthritis,    Abdominal   Peds  Hematology negative hematology ROS (+) anemia ,   Anesthesia Other Findings   Reproductive/Obstetrics negative OB ROS                            Anesthesia Physical Anesthesia Plan  ASA: III  Anesthesia Plan: General   Post-op Pain Management:    Induction: Intravenous  Airway Management Planned: Oral ETT  Additional Equipment:   Intra-op Plan:   Post-operative Plan: Extubation in OR  Informed Consent: I have reviewed the patients History and Physical, chart, labs and discussed the procedure including the risks, benefits and alternatives for the proposed anesthesia with the patient or authorized representative who has indicated his/her understanding and acceptance.   Dental advisory given  Plan Discussed with: CRNA  Anesthesia Plan Comments:         Anesthesia Quick Evaluation

## 2016-08-09 ENCOUNTER — Inpatient Hospital Stay (HOSPITAL_COMMUNITY): Payer: Medicare Other | Admitting: Anesthesiology

## 2016-08-09 ENCOUNTER — Inpatient Hospital Stay (HOSPITAL_COMMUNITY)
Admission: RE | Admit: 2016-08-09 | Discharge: 2016-08-11 | DRG: 254 | Disposition: A | Discharge status: Home Health | Payer: Medicare Other | Source: Ambulatory Visit | Attending: Surgery | Admitting: Surgery

## 2016-08-09 ENCOUNTER — Encounter (HOSPITAL_COMMUNITY): Payer: Self-pay | Admitting: *Deleted

## 2016-08-09 ENCOUNTER — Telehealth: Payer: Self-pay | Admitting: Surgery

## 2016-08-09 ENCOUNTER — Encounter (HOSPITAL_COMMUNITY)
Admission: RE | Disposition: A | Discharge status: Home Health | Payer: Self-pay | Source: Ambulatory Visit | Attending: Surgery

## 2016-08-09 DIAGNOSIS — R269 Unspecified abnormalities of gait and mobility: Secondary | ICD-10-CM

## 2016-08-09 DIAGNOSIS — I70212 Atherosclerosis of native arteries of extremities with intermittent claudication, left leg: Principal | ICD-10-CM | POA: Diagnosis present

## 2016-08-09 DIAGNOSIS — D649 Anemia, unspecified: Secondary | ICD-10-CM | POA: Diagnosis present

## 2016-08-09 DIAGNOSIS — Z95828 Presence of other vascular implants and grafts: Secondary | ICD-10-CM | POA: Diagnosis not present

## 2016-08-09 DIAGNOSIS — Z01812 Encounter for preprocedural laboratory examination: Secondary | ICD-10-CM | POA: Diagnosis not present

## 2016-08-09 DIAGNOSIS — K219 Gastro-esophageal reflux disease without esophagitis: Secondary | ICD-10-CM | POA: Diagnosis present

## 2016-08-09 DIAGNOSIS — I2510 Atherosclerotic heart disease of native coronary artery without angina pectoris: Secondary | ICD-10-CM | POA: Diagnosis present

## 2016-08-09 DIAGNOSIS — Z8249 Family history of ischemic heart disease and other diseases of the circulatory system: Secondary | ICD-10-CM

## 2016-08-09 DIAGNOSIS — Z951 Presence of aortocoronary bypass graft: Secondary | ICD-10-CM | POA: Diagnosis not present

## 2016-08-09 DIAGNOSIS — Z7902 Long term (current) use of antithrombotics/antiplatelets: Secondary | ICD-10-CM | POA: Diagnosis not present

## 2016-08-09 DIAGNOSIS — I10 Essential (primary) hypertension: Secondary | ICD-10-CM | POA: Diagnosis not present

## 2016-08-09 DIAGNOSIS — E7800 Pure hypercholesterolemia, unspecified: Secondary | ICD-10-CM | POA: Diagnosis present

## 2016-08-09 DIAGNOSIS — Z7982 Long term (current) use of aspirin: Secondary | ICD-10-CM

## 2016-08-09 DIAGNOSIS — I6522 Occlusion and stenosis of left carotid artery: Secondary | ICD-10-CM | POA: Diagnosis not present

## 2016-08-09 DIAGNOSIS — F17210 Nicotine dependence, cigarettes, uncomplicated: Secondary | ICD-10-CM | POA: Diagnosis not present

## 2016-08-09 DIAGNOSIS — Z0183 Encounter for blood typing: Secondary | ICD-10-CM

## 2016-08-09 DIAGNOSIS — D509 Iron deficiency anemia, unspecified: Secondary | ICD-10-CM | POA: Diagnosis not present

## 2016-08-09 DIAGNOSIS — I739 Peripheral vascular disease, unspecified: Secondary | ICD-10-CM

## 2016-08-09 HISTORY — PX: FEMORAL-POPLITEAL BYPASS GRAFT: SHX937

## 2016-08-09 LAB — CBC
HEMATOCRIT: 37.2 % — AB (ref 39.0–52.0)
Hemoglobin: 12.2 g/dL — ABNORMAL LOW (ref 13.0–17.0)
MCH: 32.9 pg (ref 26.0–34.0)
MCHC: 32.8 g/dL (ref 30.0–36.0)
MCV: 100.3 fL — ABNORMAL HIGH (ref 78.0–100.0)
PLATELETS: 140 10*3/uL — AB (ref 150–400)
RBC: 3.71 MIL/uL — ABNORMAL LOW (ref 4.22–5.81)
RDW: 13.1 % (ref 11.5–15.5)
WBC: 8.9 10*3/uL (ref 4.0–10.5)

## 2016-08-09 LAB — CREATININE, SERUM
Creatinine, Ser: 1.17 mg/dL (ref 0.61–1.24)
GFR, EST NON AFRICAN AMERICAN: 59 mL/min — AB (ref >60)

## 2016-08-09 SURGERY — BYPASS GRAFT FEMORAL-POPLITEAL ARTERY
Anesthesia: General | Laterality: Left

## 2016-08-09 MED ORDER — OXYCODONE-ACETAMINOPHEN 5-325 MG PO TABS
1.0000 | ORAL_TABLET | Freq: Four times a day (QID) | ORAL | Status: DC | PRN
  Administered 2016-08-09 – 2016-08-10 (×4): 2 via ORAL
  Filled 2016-08-09 (×4): qty 2

## 2016-08-09 MED ORDER — HYDROMORPHONE HCL 1 MG/ML IJ SOLN
0.2500 mg | INTRAMUSCULAR | Status: DC | PRN
Start: 2016-08-09 — End: 2016-08-09
  Administered 2016-08-09 (×2): 0.5 mg via INTRAVENOUS

## 2016-08-09 MED ORDER — PANTOPRAZOLE SODIUM 40 MG PO TBEC
40.0000 mg | DELAYED_RELEASE_TABLET | Freq: Every day | ORAL | Status: DC
  Administered 2016-08-09 – 2016-08-11 (×3): 40 mg via ORAL
  Filled 2016-08-09 (×3): qty 1

## 2016-08-09 MED ORDER — SODIUM CHLORIDE 0.9 % IV SOLN
INTRAVENOUS | Status: DC
  Administered 2016-08-09: 12:00:00 via INTRAVENOUS

## 2016-08-09 MED ORDER — MAGNESIUM HYDROXIDE 400 MG/5ML PO SUSP: 30.0000 mL | Freq: Every day | ORAL | Status: DC | PRN

## 2016-08-09 MED ORDER — CLOPIDOGREL BISULFATE 75 MG PO TABS
75.0000 mg | ORAL_TABLET | Freq: Every day | ORAL | Status: DC
Start: 2016-08-10 — End: 2016-08-11
  Administered 2016-08-10 – 2016-08-11 (×2): 75 mg via ORAL
  Filled 2016-08-09 (×2): qty 1

## 2016-08-09 MED ORDER — NIFEDIPINE ER OSMOTIC RELEASE 30 MG PO TB24
90.0000 mg | ORAL_TABLET | Freq: Every day | ORAL | Status: DC
  Administered 2016-08-10 – 2016-08-11 (×2): 90 mg via ORAL
  Filled 2016-08-09: qty 3
  Filled 2016-08-09: qty 1

## 2016-08-09 MED ORDER — ACETAMINOPHEN 325 MG RE SUPP: 325.0000 mg | RECTAL | Status: DC | PRN

## 2016-08-09 MED ORDER — HEPARIN SODIUM (PORCINE) 1000 UNIT/ML IJ SOLN
INTRAMUSCULAR | Status: AC
  Filled 2016-08-09: qty 1

## 2016-08-09 MED ORDER — FERROUS GLUCONATE 324 (38 FE) MG PO TABS
324.0000 mg | ORAL_TABLET | Freq: Every day | ORAL | Status: DC
  Administered 2016-08-10 – 2016-08-11 (×2): 324 mg via ORAL
  Filled 2016-08-09 (×2): qty 1

## 2016-08-09 MED ORDER — METOPROLOL TARTRATE 5 MG/5ML IV SOLN: 2.0000 mg | INTRAVENOUS | Status: DC | PRN

## 2016-08-09 MED ORDER — LIDOCAINE 2% (20 MG/ML) 5 ML SYRINGE
INTRAMUSCULAR | Status: AC
  Filled 2016-08-09: qty 5

## 2016-08-09 MED ORDER — DOCUSATE SODIUM 100 MG PO CAPS: 100.0000 mg | ORAL_CAPSULE | Freq: Every day | ORAL | Status: DC | PRN

## 2016-08-09 MED ORDER — ROCURONIUM BROMIDE 10 MG/ML (PF) SYRINGE
PREFILLED_SYRINGE | INTRAVENOUS | Status: AC
  Filled 2016-08-09: qty 10

## 2016-08-09 MED ORDER — LABETALOL HCL 5 MG/ML IV SOLN
10.0000 mg | INTRAVENOUS | Status: DC | PRN
  Filled 2016-08-09: qty 4

## 2016-08-09 MED ORDER — FENTANYL CITRATE (PF) 100 MCG/2ML IJ SOLN
INTRAMUSCULAR | Status: AC
  Filled 2016-08-09: qty 2

## 2016-08-09 MED ORDER — PROTAMINE SULFATE 10 MG/ML IV SOLN
INTRAVENOUS | Status: AC
  Filled 2016-08-09: qty 5

## 2016-08-09 MED ORDER — HEMOSTATIC AGENTS (NO CHARGE) OPTIME
TOPICAL | Status: DC | PRN
  Administered 2016-08-09: 1 via TOPICAL

## 2016-08-09 MED ORDER — PROPOFOL 10 MG/ML IV BOLUS
INTRAVENOUS | Status: DC | PRN
  Administered 2016-08-09: 70 mg via INTRAVENOUS

## 2016-08-09 MED ORDER — HYDROCHLOROTHIAZIDE 25 MG PO TABS
25.0000 mg | ORAL_TABLET | Freq: Every day | ORAL | Status: DC
  Administered 2016-08-09 – 2016-08-11 (×3): 25 mg via ORAL
  Filled 2016-08-09 (×3): qty 1

## 2016-08-09 MED ORDER — SUGAMMADEX SODIUM 200 MG/2ML IV SOLN
INTRAVENOUS | Status: AC
  Filled 2016-08-09: qty 2

## 2016-08-09 MED ORDER — GLYCOPYRROLATE 0.2 MG/ML IJ SOLN
INTRAMUSCULAR | Status: DC | PRN
  Administered 2016-08-09: 0.2 mg via INTRAVENOUS

## 2016-08-09 MED ORDER — LIDOCAINE HCL (CARDIAC) 20 MG/ML IV SOLN
INTRAVENOUS | Status: DC | PRN
  Administered 2016-08-09: 60 mg via INTRAVENOUS

## 2016-08-09 MED ORDER — BISACODYL 10 MG RE SUPP: 10.0000 mg | Freq: Every day | RECTAL | Status: DC | PRN

## 2016-08-09 MED ORDER — ACETAMINOPHEN 325 MG PO TABS: 325.0000 mg | ORAL_TABLET | ORAL | Status: DC | PRN

## 2016-08-09 MED ORDER — 0.9 % SODIUM CHLORIDE (POUR BTL) OPTIME
TOPICAL | Status: DC | PRN
  Administered 2016-08-09: 2000 mL

## 2016-08-09 MED ORDER — DOCUSATE SODIUM 100 MG PO CAPS
100.0000 mg | ORAL_CAPSULE | Freq: Every day | ORAL | Status: DC
  Administered 2016-08-10: 100 mg via ORAL
  Filled 2016-08-09: qty 1

## 2016-08-09 MED ORDER — MAGNESIUM SULFATE 2 GM/50ML IV SOLN
2.0000 g | Freq: Every day | INTRAVENOUS | Status: DC | PRN
  Filled 2016-08-09: qty 50

## 2016-08-09 MED ORDER — SUGAMMADEX SODIUM 200 MG/2ML IV SOLN
INTRAVENOUS | Status: DC | PRN
  Administered 2016-08-09: 124.2 mg via INTRAVENOUS

## 2016-08-09 MED ORDER — PHENOL 1.4 % MT LIQD: 1.0000 | OROMUCOSAL | Status: DC | PRN

## 2016-08-09 MED ORDER — FENTANYL CITRATE (PF) 100 MCG/2ML IJ SOLN
INTRAMUSCULAR | Status: DC | PRN
  Administered 2016-08-09: 100 ug via INTRAVENOUS

## 2016-08-09 MED ORDER — LACTATED RINGERS IV SOLN
INTRAVENOUS | Status: DC | PRN
  Administered 2016-08-09 (×2): via INTRAVENOUS

## 2016-08-09 MED ORDER — CHLORHEXIDINE GLUCONATE CLOTH 2 % EX PADS: 6.0000 | MEDICATED_PAD | Freq: Once | CUTANEOUS | Status: DC

## 2016-08-09 MED ORDER — HYDRALAZINE HCL 20 MG/ML IJ SOLN: 5.0000 mg | INTRAMUSCULAR | Status: DC | PRN

## 2016-08-09 MED ORDER — LOSARTAN POTASSIUM 50 MG PO TABS
100.0000 mg | ORAL_TABLET | Freq: Every day | ORAL | Status: DC
  Administered 2016-08-09 – 2016-08-11 (×3): 100 mg via ORAL
  Filled 2016-08-09 (×3): qty 2

## 2016-08-09 MED ORDER — PROTAMINE SULFATE 10 MG/ML IV SOLN
INTRAVENOUS | Status: DC | PRN
  Administered 2016-08-09: 10 mg via INTRAVENOUS

## 2016-08-09 MED ORDER — DEXTROSE 5 % IV SOLN
1.5000 g | Freq: Two times a day (BID) | INTRAVENOUS | Status: AC
  Administered 2016-08-09 – 2016-08-10 (×2): 1.5 g via INTRAVENOUS
  Filled 2016-08-09 (×2): qty 1.5

## 2016-08-09 MED ORDER — SODIUM CHLORIDE 0.9 % IV SOLN: 500.0000 mL | Freq: Once | INTRAVENOUS | Status: DC | PRN

## 2016-08-09 MED ORDER — HEPARIN SODIUM (PORCINE) 5000 UNIT/ML IJ SOLN
5000.0000 [IU] | Freq: Three times a day (TID) | INTRAMUSCULAR | Status: DC
  Administered 2016-08-10 – 2016-08-11 (×3): 5000 [IU] via SUBCUTANEOUS
  Filled 2016-08-09 (×3): qty 1

## 2016-08-09 MED ORDER — ONDANSETRON HCL 4 MG/2ML IJ SOLN
INTRAMUSCULAR | Status: DC | PRN
  Administered 2016-08-09: 4 mg via INTRAVENOUS

## 2016-08-09 MED ORDER — ALUM & MAG HYDROXIDE-SIMETH 200-200-20 MG/5ML PO SUSP
15.0000 mL | ORAL | Status: DC | PRN
  Administered 2016-08-10: 30 mL via ORAL
  Filled 2016-08-09: qty 30

## 2016-08-09 MED ORDER — ASPIRIN EC 81 MG PO TBEC
81.0000 mg | DELAYED_RELEASE_TABLET | Freq: Every evening | ORAL | Status: DC
  Administered 2016-08-09 – 2016-08-10 (×2): 81 mg via ORAL
  Filled 2016-08-09 (×2): qty 1

## 2016-08-09 MED ORDER — METOPROLOL TARTRATE 50 MG PO TABS
50.0000 mg | ORAL_TABLET | Freq: Every day | ORAL | Status: DC
  Administered 2016-08-10 – 2016-08-11 (×2): 50 mg via ORAL
  Filled 2016-08-09 (×2): qty 1

## 2016-08-09 MED ORDER — HEPARIN SODIUM (PORCINE) 1000 UNIT/ML IJ SOLN
INTRAMUSCULAR | Status: DC | PRN
  Administered 2016-08-09: 6000 [IU] via INTRAVENOUS

## 2016-08-09 MED ORDER — GUAIFENESIN-DM 100-10 MG/5ML PO SYRP: 15.0000 mL | ORAL_SOLUTION | ORAL | Status: DC | PRN

## 2016-08-09 MED ORDER — PROPOFOL 10 MG/ML IV BOLUS
INTRAVENOUS | Status: AC
  Filled 2016-08-09: qty 20

## 2016-08-09 MED ORDER — SIMVASTATIN 20 MG PO TABS
20.0000 mg | ORAL_TABLET | Freq: Every day | ORAL | Status: DC
  Administered 2016-08-09 – 2016-08-10 (×2): 20 mg via ORAL
  Filled 2016-08-09 (×2): qty 1

## 2016-08-09 MED ORDER — GLYCOPYRROLATE 0.2 MG/ML IV SOSY
PREFILLED_SYRINGE | INTRAVENOUS | Status: AC
  Filled 2016-08-09: qty 3

## 2016-08-09 MED ORDER — MORPHINE SULFATE (PF) 2 MG/ML IV SOLN
2.0000 mg | INTRAVENOUS | Status: DC | PRN
  Administered 2016-08-09: 2 mg via INTRAVENOUS
  Filled 2016-08-09: qty 1

## 2016-08-09 MED ORDER — MUPIROCIN 2 % EX OINT
1.0000 "application " | TOPICAL_OINTMENT | Freq: Two times a day (BID) | CUTANEOUS | Status: DC
  Administered 2016-08-09 – 2016-08-11 (×5): 1 via NASAL
  Filled 2016-08-09 (×4): qty 22

## 2016-08-09 MED ORDER — SODIUM CHLORIDE 0.9 % IV SOLN
INTRAVENOUS | Status: DC | PRN
  Administered 2016-08-09: 07:00:00

## 2016-08-09 MED ORDER — POTASSIUM CHLORIDE CRYS ER 20 MEQ PO TBCR: 20.0000 meq | EXTENDED_RELEASE_TABLET | Freq: Every day | ORAL | Status: DC | PRN

## 2016-08-09 MED ORDER — IOPAMIDOL (ISOVUE-300) INJECTION 61%
INTRAVENOUS | Status: AC
  Filled 2016-08-09: qty 50

## 2016-08-09 MED ORDER — ISOSORBIDE MONONITRATE ER 30 MG PO TB24
30.0000 mg | ORAL_TABLET | Freq: Every day | ORAL | Status: DC
  Administered 2016-08-10 – 2016-08-11 (×2): 30 mg via ORAL
  Filled 2016-08-09 (×2): qty 1

## 2016-08-09 MED ORDER — EPHEDRINE SULFATE 50 MG/ML IJ SOLN
INTRAMUSCULAR | Status: DC | PRN
  Administered 2016-08-09: 10 mg via INTRAVENOUS
  Administered 2016-08-09: 5 mg via INTRAVENOUS

## 2016-08-09 MED ORDER — OXYCODONE-ACETAMINOPHEN 5-325 MG PO TABS: 1.0000 | ORAL_TABLET | Freq: Four times a day (QID) | ORAL | 0 refills | Status: DC | PRN

## 2016-08-09 MED ORDER — SUCCINYLCHOLINE CHLORIDE 200 MG/10ML IV SOSY
PREFILLED_SYRINGE | INTRAVENOUS | Status: AC
  Filled 2016-08-09: qty 10

## 2016-08-09 MED ORDER — HYDROMORPHONE HCL 1 MG/ML IJ SOLN
INTRAMUSCULAR | Status: AC
  Filled 2016-08-09: qty 1

## 2016-08-09 MED ORDER — ONDANSETRON HCL 4 MG/2ML IJ SOLN: 4.0000 mg | Freq: Four times a day (QID) | INTRAMUSCULAR | Status: DC | PRN

## 2016-08-09 MED ORDER — EPHEDRINE 5 MG/ML INJ
INTRAVENOUS | Status: AC
  Filled 2016-08-09: qty 10

## 2016-08-09 MED ORDER — LOSARTAN POTASSIUM-HCTZ 100-25 MG PO TABS: 1.0000 | ORAL_TABLET | Freq: Every evening | ORAL | Status: DC

## 2016-08-09 MED ORDER — ROCURONIUM BROMIDE 100 MG/10ML IV SOLN
INTRAVENOUS | Status: DC | PRN
  Administered 2016-08-09: 50 mg via INTRAVENOUS

## 2016-08-09 MED ORDER — CEFUROXIME SODIUM 1.5 G IJ SOLR
1.5000 g | INTRAMUSCULAR | Status: AC
  Administered 2016-08-09: 1.5 g via INTRAVENOUS
  Filled 2016-08-09: qty 1.5

## 2016-08-09 MED ORDER — ONDANSETRON HCL 4 MG/2ML IJ SOLN
INTRAMUSCULAR | Status: AC
  Filled 2016-08-09: qty 2

## 2016-08-09 SURGICAL SUPPLY — 55 items
BANDAGE ESMARK 6X9 LF (GAUZE/BANDAGES/DRESSINGS) ×1 IMPLANT
BNDG ESMARK 6X9 LF (GAUZE/BANDAGES/DRESSINGS) ×3
CANISTER SUCTION 2500CC (MISCELLANEOUS) ×3 IMPLANT
CANNULA VESSEL 3MM 2 BLNT TIP (CANNULA) IMPLANT
CLIP TI MEDIUM 24 (CLIP) ×3 IMPLANT
CLIP TI WIDE RED SMALL 24 (CLIP) ×3 IMPLANT
CUFF TOURNIQUET SINGLE 24IN (TOURNIQUET CUFF) ×3 IMPLANT
DERMABOND ADVANCED (GAUZE/BANDAGES/DRESSINGS) ×2
DERMABOND ADVANCED .7 DNX12 (GAUZE/BANDAGES/DRESSINGS) ×1 IMPLANT
DRAIN CHANNEL 15F RND FF W/TCR (WOUND CARE) IMPLANT
DRAPE X-RAY CASS 24X20 (DRAPES) IMPLANT
ELECT REM PT RETURN 9FT ADLT (ELECTROSURGICAL) ×3
ELECTRODE REM PT RTRN 9FT ADLT (ELECTROSURGICAL) ×1 IMPLANT
EVACUATOR SILICONE 100CC (DRAIN) IMPLANT
GLOVE BIO SURGEON STRL SZ 6.5 (GLOVE) ×10 IMPLANT
GLOVE BIO SURGEONS STRL SZ 6.5 (GLOVE) ×5
GLOVE BIOGEL PI IND STRL 6.5 (GLOVE) ×3 IMPLANT
GLOVE BIOGEL PI IND STRL 7.0 (GLOVE) ×2 IMPLANT
GLOVE BIOGEL PI IND STRL 7.5 (GLOVE) ×1 IMPLANT
GLOVE BIOGEL PI INDICATOR 6.5 (GLOVE) ×6
GLOVE BIOGEL PI INDICATOR 7.0 (GLOVE) ×4
GLOVE BIOGEL PI INDICATOR 7.5 (GLOVE) ×2
GLOVE SURG SS PI 7.5 STRL IVOR (GLOVE) ×6 IMPLANT
GOWN STRL REUS W/ TWL LRG LVL3 (GOWN DISPOSABLE) ×4 IMPLANT
GOWN STRL REUS W/ TWL XL LVL3 (GOWN DISPOSABLE) ×1 IMPLANT
GOWN STRL REUS W/TWL LRG LVL3 (GOWN DISPOSABLE) ×8
GOWN STRL REUS W/TWL XL LVL3 (GOWN DISPOSABLE) ×2
GRAFT PROPATEN THIN WALL 6X40 (Vascular Products) IMPLANT
GRAFT PROPATEN W/RING 6X80X60 (Vascular Products) ×3 IMPLANT
HEMOSTAT SNOW SURGICEL 2X4 (HEMOSTASIS) ×3 IMPLANT
KIT BASIN OR (CUSTOM PROCEDURE TRAY) ×3 IMPLANT
KIT ROOM TURNOVER OR (KITS) ×3 IMPLANT
LIQUID BAND (GAUZE/BANDAGES/DRESSINGS) ×3 IMPLANT
NS IRRIG 1000ML POUR BTL (IV SOLUTION) ×6 IMPLANT
PACK PERIPHERAL VASCULAR (CUSTOM PROCEDURE TRAY) ×3 IMPLANT
PAD ARMBOARD 7.5X6 YLW CONV (MISCELLANEOUS) ×6 IMPLANT
SET COLLECT BLD 21X3/4 12 (NEEDLE) IMPLANT
STOPCOCK 4 WAY LG BORE MALE ST (IV SETS) IMPLANT
SUT ETHILON 3 0 PS 1 (SUTURE) IMPLANT
SUT GORETEX 6.0 TT13 (SUTURE) ×6 IMPLANT
SUT GORETEX 6.0 TT9 (SUTURE) ×3 IMPLANT
SUT PROLENE 5 0 C 1 24 (SUTURE) ×3 IMPLANT
SUT PROLENE 6 0 BV (SUTURE) ×6 IMPLANT
SUT SILK 2 0 SH (SUTURE) ×3 IMPLANT
SUT SILK 3 0 (SUTURE)
SUT SILK 3-0 18XBRD TIE 12 (SUTURE) IMPLANT
SUT VIC AB 2-0 CT1 27 (SUTURE) ×4
SUT VIC AB 2-0 CT1 TAPERPNT 27 (SUTURE) ×2 IMPLANT
SUT VIC AB 3-0 SH 27 (SUTURE) ×4
SUT VIC AB 3-0 SH 27X BRD (SUTURE) ×2 IMPLANT
SUT VICRYL 4-0 PS2 18IN ABS (SUTURE) ×6 IMPLANT
TRAY FOLEY W/METER SILVER 16FR (SET/KITS/TRAYS/PACK) ×3 IMPLANT
TUBING EXTENTION W/L.L. (IV SETS) IMPLANT
UNDERPAD 30X30 (UNDERPADS AND DIAPERS) ×3 IMPLANT
WATER STERILE IRR 1000ML POUR (IV SOLUTION) ×3 IMPLANT

## 2016-08-09 NOTE — Anesthesia Procedure Notes (Signed)
Procedure Name: Intubation Date/Time: 08/09/2016 7:45 AM Performed by: Manus Gunning, Lahela Woodin J Pre-anesthesia Checklist: Patient identified, Emergency Drugs available, Suction available, Patient being monitored and Timeout performed Patient Re-evaluated:Patient Re-evaluated prior to inductionOxygen Delivery Method: Circle system utilized Preoxygenation: Pre-oxygenation with 100% oxygen Intubation Type: IV induction Ventilation: Mask ventilation without difficulty Laryngoscope Size: Mac and 3 Grade View: Grade II Tube type: Oral Tube size: 7.5 mm Number of attempts: 1 Placement Confirmation: ETT inserted through vocal cords under direct vision,  positive ETCO2 and breath sounds checked- equal and bilateral Secured at: 21 cm Tube secured with: Tape Dental Injury: Teeth and Oropharynx as per pre-operative assessment

## 2016-08-09 NOTE — Telephone Encounter (Signed)
-----   Message from Denman George, RN sent at 08/09/2016 11:11 AM EDT ----- Regarding: needs 2 week f/u with Dr. [name]   ----- Message ----- From: Gabriel Earing, PA-C Sent: 08/09/2016   9:54 AM To: Vvs Charge Pool  S/p left fem pop bypass 08/09/16.  F/u with Dr. [name] in 2 weeks.

## 2016-08-09 NOTE — Transfer of Care (Signed)
Immediate Anesthesia Transfer of Care Note  Patient: Earl Castillo  Procedure(s) Performed: Procedure(s): BYPASS GRAFT LEFT FEMORAL-POPLITEAL ARTERY USING GORE 6MM X 80CM PROPATEN GRAFT (Left)  Patient Location: PACU  Anesthesia Type:General  Level of Consciousness: awake  Airway & Oxygen Therapy: Patient Spontanous Breathing  Post-op Assessment: Report given to RN and Post -op Vital signs reviewed and stable  Post vital signs: Reviewed and stable  Last Vitals:  Vitals:   08/09/16 0559 08/09/16 1000  BP: (!) 164/55   Pulse: (!) 52   Resp: 18   Temp: 36.5 C (P) 36.6 C    Last Pain:  Vitals:   08/09/16 1000  TempSrc:   PainSc: (P) 0-No pain      Patients Stated Pain Goal: 0 ([id number])  Complications: No apparent anesthesia complications

## 2016-08-09 NOTE — Telephone Encounter (Signed)
Spoke to son req letter to be sent for appt on 10/30 for post op

## 2016-08-09 NOTE — H&P (View-Only) (Signed)
[facility] [place]  Patient name: Earl Castillo MRN: [id number] DOB: 07-Oct-1941 Sex: male  REFERRING PHYSICIAN: [name]  REASON FOR CONSULT: left leg claudication  HPI: Earl Castillo is a 75 y.o. male, who is referred today for evaluation of left leg claudication.  The patient has a history of a left to right femoral-femoral bypass graft by [name] in 2000.  This was done for an occluded right common iliac artery.  He has undergone atherectomy and stenting on 05/23/2014.  Recently, his left superficial femoral artery has been found to be occluded.  The patient states he now has lifestyle limiting claudication at 50 yards.  He denies ulcers.  He would like something done to improve his walking.  The patient has previously undergone right carotid endarterectomy by Dr. Deon Pilling in 2006.  He is followed for bilateral carotid stenosis, most recently in June 2017 the left side was in the 60-79% range.  The patient is asymptomatic.  He has a history of coronary artery disease.  He is status post CABG in 2004.  Patient has extensive smoking history   Patient has a history of hypertension which is medically managed.  He takes a statin for hypercholesterolemia.  He is currently still smoking. Past Medical History:  Diagnosis Date  . Anemia   . Angiodysplasia of intestine - small bowel  09/21/2013  . Blood transfusion abn reaction or complication, no procedure mishap    "HgB dropped real low"  . Carotid artery disease ([place])    status post carotid endarterectomy by [name]  . Chicken pox   . Colon polyps 08/2012   Hyperplastic and adenomatous sessile polyps  . Coronary artery disease    CABG'04, Low risk Myoview 2013  . GERD (gastroesophageal reflux disease)   . Hemorrhoid   . History of blood transfusion 08/2013   "HgB dropped real low; related to bleeding from my rectum"  . Hyperlipidemia   . Hypertension   . 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 Lower GI bleeding 08/2013  . Nephrolithiasis    "passed them most of the time"  . Osteoarthritis    "hips" (05/23/2014)  . Peripheral arterial disease ([place])    status post left to right fem-fem crossover graft in by [name]  . Sinus headache    "alot" (05/23/2014)  . Skin exam, screening for cancer    sees [name]   . Vitamin B12 deficiency 11/23/2013    Family History  Problem Relation Age of Onset  . Heart disease Father   . Heart disease Sister   . Coronary artery disease Brother     CABG x 6  . Heart disease Brother   . Coronary artery disease Brother     first MI 77, died at 46 during cardiac surgery  . Coronary artery disease Sister     CABG x 5, RAS  . Heart disease Sister   . Colon cancer Neg Hx     SOCIAL HISTORY: Social History   Social History  . Marital status: Married    Spouse name: N/A  . Number of children: 2  . Years of education: N/A   Occupational History  .  Semi Retired Geophysicist/field seismologist    Social History Main Topics  . Smoking status: Current Some Day Smoker    Packs/day: 0.25    Years: 58.00    Types: Cigarettes  . Smokeless tobacco:  Never Used     Comment: 3 cigarettes every couple of days  . Alcohol use No  . Drug use: No  . Sexual activity: Not Currently   Other Topics Concern  . Not on file   Social History Narrative   Daily caffeine     No Known Allergies  Current Outpatient Prescriptions  Medication Sig Dispense Refill  . acetaminophen (TYLENOL) 500 MG tablet Take 1,000 mg by mouth daily as needed (for leg pain).     Marland Kitchen aspirin EC 81 MG tablet Take 81 mg by mouth daily.    . clopidogrel (PLAVIX) 75 MG tablet Take 1 tablet (75 mg total) by mouth daily. 30 tablet 11  . cyanocobalamin (,VITAMIN B-12,) 1000 MCG/ML injection Inject 1,000 mcg into the muscle every 30 (thirty) days.     Marland Kitchen docusate sodium (COLACE) 100 MG capsule Take 100  mg by mouth daily as needed (for constipation).     . ferrous gluconate (FERGON) 324 MG tablet TAKE 1 TABLET BY MOUTH EVERY DAY WITH BREAKFAST 30 tablet 11  . isosorbide mononitrate (IMDUR) 30 MG 24 hr tablet Take 1 tablet (30 mg total) by mouth daily. 30 tablet 11  . losartan-hydrochlorothiazide (HYZAAR) 100-25 MG tablet Take 1 tablet by mouth daily. 30 tablet 11  . metoprolol (LOPRESSOR) 50 MG tablet TAKE 1 TABLET BY MOUTH DAILY 30 tablet 5  . neomycin-colistin-hydrocortisone-thonzonium (CORTISPORIN TC) 3.01-04-09-0.5 MG/ML otic suspension Place 4 drops into the left ear 4 (four) times daily as needed (for itching). 10 mL 0  . NIFEdipine (PROCARDIA XL/ADALAT-CC) 90 MG 24 hr tablet TAKE 1 TABLET BY MOUTH EVERY DAY 90 tablet 3  . simvastatin (ZOCOR) 20 MG tablet TAKE 1 TABLET BY MOUTH IN THE EVENING AFTER SUPPER 90 tablet 3   No current facility-administered medications for this visit.     REVIEW OF SYSTEMS:  [X]  denotes positive finding, [ ]  denotes negative finding Cardiac  Comments:  Chest pain or chest pressure:    Shortness of breath upon exertion:    Short of breath when lying flat:    Irregular heart rhythm:        Vascular    Pain in calf, thigh, or hip brought on by ambulation: x   Pain in feet at night that wakes you up from your sleep:     Blood clot in your veins:    Leg swelling:         Pulmonary    Oxygen at home:    Productive cough:     Wheezing:         Neurologic    Sudden weakness in arms or legs:     Sudden numbness in arms or legs:     Sudden onset of difficulty speaking or slurred speech:    Temporary loss of vision in one eye:     Problems with dizziness:         Gastrointestinal    Blood in stool:     Vomited blood:         Genitourinary    Burning when urinating:     Blood in urine:        Psychiatric    Major depression:         Hematologic    Bleeding problems:    Problems with blood clotting too easily:        Skin    Rashes or ulcers:         Constitutional    Fever or  chills:      PHYSICAL EXAM: Vitals:   07/29/16 1059 07/29/16 1100  BP: (!) 160/73 (!) 158/73  Pulse: (!) 54   Resp: 18   Temp: 98.7 F (37.1 C)   TempSrc: Oral   SpO2: 96%   Weight: 138 lb (62.6 kg)   Height: 5' 6.5" (1.689 m)     GENERAL: The patient is a well-nourished male, in no acute distress. The vital signs are documented above. CARDIAC: There is a regular rate and rhythm.  VASCULAR: Nonpalpable pedal pulse in the left. PULMONARY: There is good air exchange bilaterally without wheezing or rales. ABDOMEN: Soft and non-tender with normal pitched bowel sounds.  MUSCULOSKELETAL: There are no major deformities or cyanosis. NEUROLOGIC: No focal weakness or paresthesias are detected. SKIN: There are no ulcers or rashes noted. PSYCHIATRIC: The patient has a normal affect.  DATA:  I have reviewed his arteriogram shows a patent left-to-right femoral-femoral bypass graft.  The left superficial femoral artery is occluded at its origin and reconstitutes in the adductor canal with three-vessel runoff.  There also appears to be a profunda femoral stenosis.   Vein mapping was ordered today.  He has a marginal saphenous vein on the right and it has been harvested on the left  ASSESSMENT AND PLAN: Left leg claudication: I discussed with the patient and his wife, the most appropriate next Would be to proceed with a left femoral to above-knee popliteal artery bypass graft using a Gore-Tex graft.  I do not think harvesting the vein out of his right leg is the appropriate next step given the diameter of the vein as well as the fact that it is on the contralateral leg.  I discussed risks of the operation including but not limited to leg swelling, incisional complications, graft infection, cardia Porter complications.  The patient would like to get this done as soon as possible.  I will stop his Plavix this Friday and scheduled his operation for Friday, October  6   Annamarie Major, MD [facility] [phone] Pager [phone]

## 2016-08-09 NOTE — Interval H&P Note (Signed)
History and Physical Interval Note:  08/09/2016 7:28 AM  Earl Castillo  has presented today for surgery, with the diagnosis of Peripheral vascular disease with left lower extremity claudication I70.212  The various methods of treatment have been discussed with the patient and family. After consideration of risks, benefits and other options for treatment, the patient has consented to  Procedure(s): BYPASS GRAFT FEMORAL-POPLITEAL ARTERY (Left) as a surgical intervention .  The patient's history has been reviewed, patient examined, no change in status, stable for surgery.  I have reviewed the patient's chart and labs.  Questions were answered to the patient's satisfaction.     Annamarie Major

## 2016-08-09 NOTE — Care Management Note (Addendum)
Case Management Note  Patient Details  Name: Earl Castillo MRN: [id number] Date of Birth: [birth year]  Subjective/Objective:   Patient is from home with wife and son, s/p fem pop.  He states pta he was indep.  NCM informed patient that he has been pre set up for a [facility] with [facility] at discharge by the MD office.  He said yes he was agreeable to this. They already have the orders.  NCM notified Tiffany of this information.  NCM will cont to follow for dc needs.                  Action/Plan:   Expected Discharge Date:                  Expected Discharge Plan:  [place]  In-House Referral:     Discharge planning Services  CM Consult  Post Acute Care Choice:  Home Health Choice offered to:  Patient  DME Arranged:    DME Agency:     HH Arranged:  RN [place] Agency:  [facility]  Status of Service:  Completed, signed off  If discussed at [employer] of Stay Meetings, dates discussed:    Additional Comments:  Zenon Mayo, RN 08/09/2016, 5:25 PM

## 2016-08-09 NOTE — Anesthesia Postprocedure Evaluation (Signed)
Anesthesia Post Note  Patient: Earl Castillo  Procedure(s) Performed: Procedure(s) (LRB): BYPASS GRAFT LEFT FEMORAL-POPLITEAL ARTERY USING GORE 6MM X 80CM PROPATEN GRAFT (Left)  Patient location during evaluation: PACU Anesthesia Type: General Level of consciousness: awake and alert Pain management: pain level controlled Vital Signs Assessment: post-procedure vital signs reviewed and stable Respiratory status: spontaneous breathing, nonlabored ventilation, respiratory function stable and patient connected to nasal cannula oxygen Cardiovascular status: blood pressure returned to baseline and stable Postop Assessment: no signs of nausea or vomiting Anesthetic complications: no    Last Vitals:  Vitals:   08/09/16 1000 08/09/16 1107  BP:  (!) 154/59  Pulse:  (!) 57  Resp:  13  Temp: 36.6 C 36.4 C    Last Pain:  Vitals:   08/09/16 1107  TempSrc: Oral  PainSc:                  Hayat Warbington,W. EDMOND

## 2016-08-10 LAB — BASIC METABOLIC PANEL
ANION GAP: 5 (ref 5–15)
BUN: 16 mg/dL (ref 6–20)
CALCIUM: 8.9 mg/dL (ref 8.9–10.3)
CO2: 29 mmol/L (ref 22–32)
Chloride: 103 mmol/L (ref 101–111)
Creatinine, Ser: 1.23 mg/dL (ref 0.61–1.24)
GFR, EST NON AFRICAN AMERICAN: 56 mL/min — AB (ref >60)
Glucose, Bld: 106 mg/dL — ABNORMAL HIGH (ref 65–99)
Potassium: 3.8 mmol/L (ref 3.5–5.1)
Sodium: 137 mmol/L (ref 135–145)

## 2016-08-10 LAB — CBC
HEMATOCRIT: 38.9 % — AB (ref 39.0–52.0)
Hemoglobin: 12.5 g/dL — ABNORMAL LOW (ref 13.0–17.0)
MCH: 32.1 pg (ref 26.0–34.0)
MCHC: 32.1 g/dL (ref 30.0–36.0)
MCV: 99.7 fL (ref 78.0–100.0)
PLATELETS: 141 10*3/uL — AB (ref 150–400)
RBC: 3.9 MIL/uL — ABNORMAL LOW (ref 4.22–5.81)
RDW: 12.8 % (ref 11.5–15.5)
WBC: 8.9 10*3/uL (ref 4.0–10.5)

## 2016-08-10 NOTE — Evaluation (Signed)
Physical Therapy Evaluation Patient Details Name: Earl Castillo MRN: [id number] DOB: [birth year] Today's Date: 08/10/2016   History of Present Illness  Earl Castillo is a 75 y.o. male, who is referred today for evaluation of left leg claudication.  PMH positive for CABG, CEA, HTN, HLD, GERD, L to R fem-fem bypass 2015.  Now s/p L fem-pop bypass.  Clinical Impression  Patient presents with decreased mobility and safety due to deficits listed in PT problem list.  He will benefit from skilled PT in the acute setting to allow return home with family support and follow up HHPT for safety .     Follow Up Recommendations Home health PT    Equipment Recommendations  Rolling walker with 5" wheels    Recommendations for Other Services       Precautions / Restrictions Precautions Precautions: Fall      Mobility  Bed Mobility Overal bed mobility: Modified Independent                Transfers Overall transfer level: Needs assistance Equipment used: Rolling walker (2 wheeled) Transfers: Sit to/from Stand Sit to Stand: Min guard         General transfer comment: assist for balance and lines  Ambulation/Gait Ambulation/Gait assistance: Min guard Ambulation Distance (Feet): 200 Feet Assistive device: Rolling walker (2 wheeled) Gait Pattern/deviations: Step-through pattern;Antalgic;Decreased stance time - left     General Gait Details: assist for walker safety, gets outside base of walker on turns and needs assist and cues to correct, slow pace  Stairs            Wheelchair Mobility    Modified Rankin (Stroke Patients Only)       Balance Overall balance assessment: Needs assistance   Sitting balance-Leahy Scale: Good     Standing balance support: No upper extremity supported Standing balance-Leahy Scale: Fair Standing balance comment: able to stand without UE support as tried to void in urinal                             Pertinent  Vitals/Pain Pain Assessment: 0-10 Pain Score: 3  Pain Location: L groin Pain Descriptors / Indicators: Sore;Burning Pain Intervention(s): Monitored during session;Repositioned    Home Living Family/patient expects to be discharged to:: Private residence Living Arrangements: Spouse/significant other Available Help at Discharge: Family Type of Home: House Home Access: Stairs to enter Entrance Stairs-Rails: None Technical brewer of Steps: 1 Home Layout: One level Home Equipment: None Additional Comments: has had walkers, but allows other to use them    Prior Function Level of Independence: Independent               Hand Dominance        Extremity/Trunk Assessment   Upper Extremity Assessment: Overall WFL for tasks assessed           Lower Extremity Assessment: LLE deficits/detail   LLE Deficits / Details: limted hip flexion due to soreness     Communication   Communication: No difficulties  Cognition Arousal/Alertness: Awake/alert Behavior During Therapy: WFL for tasks assessed/performed Overall Cognitive Status: Within Functional Limits for tasks assessed                      General Comments General comments (skin integrity, edema, etc.): Educated on safety with walker and possible need for follow up HHPT at d/c.    Exercises     Assessment/Plan  PT Assessment Patient needs continued PT services  PT Problem List Decreased strength;Decreased mobility;Decreased balance;Decreased knowledge of use of DME;Decreased activity tolerance;Pain;Decreased safety awareness          PT Treatment Interventions DME instruction;Gait training;Functional mobility training;Balance training;Therapeutic exercise;Therapeutic activities;Patient/family education    PT Goals (Current goals can be found in the Care Plan section)  Acute Rehab PT Goals Patient Stated Goal: To go home PT Goal Formulation: With patient Time For Goal Achievement:  08/13/16 Potential to Achieve Goals: Good    Frequency Min 3X/week   Barriers to discharge        Co-evaluation               End of Session Equipment Utilized During Treatment: Gait belt Activity Tolerance: Patient tolerated treatment well Patient left: in bed;with call bell/phone within reach           Time: 1140-1202 PT Time Calculation (min) (ACUTE ONLY): 22 min   Charges:   PT Evaluation $PT Eval Moderate Complexity: 1 Procedure     PT G CodesReginia Naas 2016-08-22, 1:30 PM  Magda Kiel, [place] 2016/08/22

## 2016-08-10 NOTE — Progress Notes (Addendum)
[facility] [place]  Subjective  - Pain with mobility.   Objective (!) 134/58 (!) 56 98.4 F (36.9 C) (Oral) 20 93%  Intake/Output Summary (Last 24 hours) at 08/10/16 0811 Last data filed at 08/10/16 0700  Gross per 24 hour  Intake             1840 ml  Output             2660 ml  Net             -820 ml    Palpable DP left LE Incisions soft without hematoma Heart RRR Lungs non labored breathing   Assessment/Planning: POD # 1 Left LE by pass  He has not voided nor had solid food. I will transfer him to 2W. PT eval pending Possible discharge home tomorrow  He is on Plavix, asa, betablocker and statin  Theda Sers, [facility] 08/10/2016 8:11 AM -- 2+ DP pulse, incisions clean Ambulate today Transfer 2w Most likely d/c home tomorrow  Ruta Hinds, MD [facility] of [place]: [phone] Pager: [phone]  Laboratory Lab Results:  Recent Labs  08/09/16 1152 08/10/16 0332  WBC 8.9 8.9  HGB 12.2* 12.5*  HCT 37.2* 38.9*  PLT 140* 141*   BMET  Recent Labs  08/07/16 1100 08/09/16 1152 08/10/16 0332  NA 138  --  137  K 3.7  --  3.8  CL 103  --  103  CO2 27  --  29  GLUCOSE 103*  --  106*  BUN 20  --  16  CREATININE 1.33* 1.17 1.23  CALCIUM 9.6  --  8.9    COAG Lab Results  Component Value Date   INR 1.11 08/07/2016   INR 1.0 05/31/2016   INR 1.02 05/17/2014   No results found for: PTT

## 2016-08-11 MED ORDER — OXYCODONE-ACETAMINOPHEN 5-325 MG PO TABS: 1.0000 | ORAL_TABLET | Freq: Four times a day (QID) | ORAL | 0 refills | Status: DC | PRN

## 2016-08-11 NOTE — Progress Notes (Addendum)
[facility]ists of   Subjective  - Doing well and ready to go home.   Objective (!) 135/45 70 98.1 F (36.7 C) (Oral) 20 92%  Intake/Output Summary (Last 24 hours) at 08/11/16 0718 Last data filed at 08/11/16 0409  Gross per 24 hour  Intake          1496.25 ml  Output              625 ml  Net           871.25 ml    Palpable DP/PT bilaterally  Left groin incision without hematoma  Assessment/Planning: POD # 2 Left LE by pass  D/C home f/u in 2 weeks with Dr. [name] He is on Plavix, asa, betablocker and statin HH and rolling walker ordered for home use.  Laurence Slate [facility] 08/11/2016 7:18 AM -- 2+ DP pulse Agree with above D/c home  Ruta Hinds, MD [facility] of [place]: [phone] Pager: [phone]  Laboratory Lab Results:  Recent Labs  08/09/16 1152 08/10/16 0332  WBC 8.9 8.9  HGB 12.2* 12.5*  HCT 37.2* 38.9*  PLT 140* 141*   BMET  Recent Labs  08/09/16 1152 08/10/16 0332  NA  --  137  K  --  3.8  CL  --  103  CO2  --  29  GLUCOSE  --  106*  BUN  --  16  CREATININE 1.17 1.23  CALCIUM  --  8.9    COAG Lab Results  Component Value Date   INR 1.11 08/07/2016   INR 1.0 05/31/2016   INR 1.02 05/17/2014   No results found for: PTT

## 2016-08-11 NOTE — Op Note (Signed)
Patient name: MILLIE ARSLAN MRN: [id number] DOB: 1941-04-21 Sex: male  08/09/2016 Pre-operative Diagnosis: Severe left leg claudication Post-operative diagnosis:  Same Surgeon:  Annamarie Major Assistants:  Leontine Locket Procedure:   Left femoral to above-knee popliteal artery bypass graft with 6 mm external ring PTFE   #2: Redo left femoral artery exposure Anesthesia:  Gen. Blood Loss:  See anesthesia record Specimens:  None  Findings:   the proximal portion of the bypass graft was to the distal portion of the left-to-right femoral-femoral graft.  The above-knee anastomosis was to the popliteal artery which was disease-free.  The patient had an excellent graft-dependent posterior tibial Doppler signal at the end of the procedure.  Indications:  The patient has a history of a left-to-right femoral-femoral bypass graft in the remote past.  He has undergone percutaneous intervention to the left superficial femoral artery which has failed.  He comes in today for surgical revascularization.  His vein was suboptimal.  Procedure:  The patient was identified in the holding area and taken to [place] 12  The patient was then placed supine on the table. general anesthesia was administered.  The patient was prepped and draped in the usual sterile fashion.  A time out was called and antibiotics were administered.  The patient's previous left femoral incision which was a longitudinal incision was opened with cautery.  Subcutaneous tissue and scar tissue was divided with cautery and Metzenbaum scissors.  I identified the femoral-femoral graft and exposed this down to the femoral artery.  Once I had adequate exposure of the graft the wound was packed with a wet Ray-Tec.  I then made a medial above-knee incision with a #10 blade.  Cautery was used to divide the subcutaneous tissue and fascia.  The popliteal space was entered.  The above-knee popliteal artery was then dissected free.  This was a relatively  soft artery.  I then created a subsartorial tunnel.  The patient was then fully heparinized.  After the heparin circulated Henley clamps were used to occlude the femoral-femoral graft.  I used #11 blade to make a graftotomy.  There was a dense capsule around the graft which was removed with Metzenbaum scissors.  I then identified the hood of the femoral-femoral anastomosis and opened this with a #11 blade and extended with Potts scissors.  A 6 mm external ring PTFE propatent graft was selected and beveled to fit the size of the graftotomy.  A running anastomosis was created with CV 6 suture.  Prior to completion the appropriate flushing maneuvers were performed and the anastomosis was completed.  There was excellent pulsatile flow through the graft.  The graft was then pulled through the tunnel.  A web roll was placed on the proximal thigh followed by a tourniquet.  The leg was exsanguinated with an Esmarch.  The tourniquet was taken to 250 mm of pressure.  A #11 blade was used to make an arteriotomy.  This was extended longitudinally with Potts scissors.  The graft was pulled to the appropriate length and spatulated to fit the size of the arteriotomy.  A running anastomosis was created with CV 6 Gore-Tex suture.  Prior to completion, the tourniquet was let down.  Appropriate flushing maneuvers were performed and the anastomosis was completed.  The patient had a graft dependent signal in the posterior tibial and anterior tibial artery.  50 mg of protamine was given.  Once hemostasis was satisfactory the incisions were closed with multiple layers of 30 and  4-0 Vicryl followed by Dermabond.  There were no immediate complications.   Disposition:  To PACU in stable condition.   Theotis Burrow, M.D. [facility] [place] Office: [phone] Pager:  [phone]

## 2016-08-12 ENCOUNTER — Encounter (HOSPITAL_COMMUNITY): Payer: Self-pay | Admitting: Surgery

## 2016-08-12 DIAGNOSIS — F17200 Nicotine dependence, unspecified, uncomplicated: Secondary | ICD-10-CM | POA: Diagnosis not present

## 2016-08-12 DIAGNOSIS — I739 Peripheral vascular disease, unspecified: Secondary | ICD-10-CM | POA: Diagnosis not present

## 2016-08-12 DIAGNOSIS — M6281 Muscle weakness (generalized): Secondary | ICD-10-CM | POA: Diagnosis not present

## 2016-08-12 DIAGNOSIS — Z48812 Encounter for surgical aftercare following surgery on the circulatory system: Secondary | ICD-10-CM | POA: Diagnosis not present

## 2016-08-12 DIAGNOSIS — R2689 Other abnormalities of gait and mobility: Secondary | ICD-10-CM | POA: Diagnosis not present

## 2016-08-12 DIAGNOSIS — I10 Essential (primary) hypertension: Secondary | ICD-10-CM | POA: Diagnosis not present

## 2016-08-12 DIAGNOSIS — I2510 Atherosclerotic heart disease of native coronary artery without angina pectoris: Secondary | ICD-10-CM | POA: Diagnosis not present

## 2016-08-12 DIAGNOSIS — D500 Iron deficiency anemia secondary to blood loss (chronic): Secondary | ICD-10-CM | POA: Diagnosis not present

## 2016-08-14 DIAGNOSIS — I10 Essential (primary) hypertension: Secondary | ICD-10-CM | POA: Diagnosis not present

## 2016-08-14 DIAGNOSIS — Z48812 Encounter for surgical aftercare following surgery on the circulatory system: Secondary | ICD-10-CM | POA: Diagnosis not present

## 2016-08-14 DIAGNOSIS — D500 Iron deficiency anemia secondary to blood loss (chronic): Secondary | ICD-10-CM | POA: Diagnosis not present

## 2016-08-14 DIAGNOSIS — I739 Peripheral vascular disease, unspecified: Secondary | ICD-10-CM | POA: Diagnosis not present

## 2016-08-14 DIAGNOSIS — F17200 Nicotine dependence, unspecified, uncomplicated: Secondary | ICD-10-CM | POA: Diagnosis not present

## 2016-08-14 DIAGNOSIS — I2510 Atherosclerotic heart disease of native coronary artery without angina pectoris: Secondary | ICD-10-CM | POA: Diagnosis not present

## 2016-08-14 NOTE — Discharge Summary (Signed)
[facility] Discharge Summary   Patient ID:  Earl Castillo MRN: [id number] DOB/AGE: 1941-10-03 75 y.o.  Admit date: 08/09/2016 Discharge date: 08/11/2016 Date of Surgery: 08/09/2016 Surgeon: Surgeon(s): Serafina Mitchell, MD  Admission Diagnosis: Peripheral vascular disease with left lower extremity claudication I70.212  Discharge Diagnoses:  Peripheral vascular disease with left lower extremity claudication I70.212  Secondary Diagnoses: Past Medical History:  Diagnosis Date  . Anemia   . Angiodysplasia of intestine - small bowel  09/21/2013  . Blood transfusion abn reaction or complication, no procedure mishap    "HgB dropped real low"  . Cancer [facility])    Skin cancers  . Carotid artery disease ([place])    status post carotid endarterectomy by [name]  . Chicken pox   . Colon polyps 08/2012   Hyperplastic and adenomatous sessile polyps  . Coronary artery disease    CABG'04, Low risk Myoview 2013  . GERD (gastroesophageal reflux disease)   . Hemorrhoid   . History of blood transfusion 08/2013   "HgB dropped real low; related to bleeding from my rectum"  . History of kidney stones    1 removed surgical, passed 2  . Hyperlipidemia   . Hypertension   . 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 Lower GI bleeding 08/2013  . Osteoarthritis    "hips" (05/23/2014)  . Peripheral arterial disease ([place])    status post left to right fem-fem crossover graft in by [name]  . Pneumonia    - "years ago"  . Sinus headache    "alot" (05/23/2014)  . Skin exam, screening for cancer    sees [name]   . Vitamin B12 deficiency 11/23/2013    Procedure(s): BYPASS GRAFT LEFT FEMORAL-POPLITEAL ARTERY USING GORE 6MM X 80CM PROPATEN GRAFT  Discharged Condition: good  HPI: Earl Castillo is a 75 y.o. male, who is referred today for evaluation of left leg claudication.  The patient has a history of a  left to right femoral-femoral bypass graft by [name] in 2000.  This was done for an occluded right common iliac artery.  He has undergone atherectomy and stenting on 05/23/2014.  Recently, his left superficial femoral artery has been found to be occluded.  The patient states he now has lifestyle limiting claudication at 50 yards.  He denies ulcers.  He would like something done to improve his walking.  The patient has previously undergone right carotid endarterectomy by Dr. Deon Pilling in 2006.  He is followed for bilateral carotid stenosis, most recently in June 2017 the left side was in the 60-79% range.  The patient is asymptomatic.  He has a history of coronary artery disease.  He is status post CABG in 2004.  Patient has extensive smoking history    Hospital Course:  Earl Castillo is a 75 y.o. male is S/P  Procedure(s): BYPASS GRAFT LEFT FEMORAL-POPLITEAL ARTERY USING GORE 6MM X 80CM PROPATEN GRAFT  Palpable DP left LE Incisions soft without hematoma Heart RRR Lungs non labored breathing   Assessment/Planning: POD # 1 Left LE by pass  He has not voided nor had solid food. I will transfer him to 2W. PT eval pending Possible discharge home tomorrow  He is on Plavix, asa, betablocker and statin   Palpable DP/PT bilaterally  Left groin incision without hematoma  Assessment/Planning: POD # 2 Left LE by pass  D/C home f/u in 2 weeks with Dr. [name] He  is on Plavix, asa, betablocker and statin HH and rolling walker ordered for home use.   Significant Diagnostic Studies: CBC Lab Results  Component Value Date   WBC 8.9 08/10/2016   HGB 12.5 (L) 08/10/2016   HCT 38.9 (L) 08/10/2016   MCV 99.7 08/10/2016   PLT 141 (L) 08/10/2016    BMET    Component Value Date/Time   NA 137 08/10/2016 0332   K 3.8 08/10/2016 0332   CL 103 08/10/2016 0332   CO2 29 08/10/2016 0332   GLUCOSE 106 (H) 08/10/2016 0332   BUN 16 08/10/2016 0332   CREATININE 1.23 08/10/2016 0332    CREATININE 1.23 (H) 05/31/2016 1136   CALCIUM 8.9 08/10/2016 0332   GFRNONAA 56 (L) 08/10/2016 0332   GFRAA >60 08/10/2016 0332   COAG Lab Results  Component Value Date   INR 1.11 08/07/2016   INR 1.0 05/31/2016   INR 1.02 05/17/2014     Disposition:  Discharge to :Home Discharge Instructions    Call MD for:  redness, tenderness, or signs of infection (pain, swelling, bleeding, redness, odor or green/yellow discharge around incision site)    Complete by:  As directed    Call MD for:  severe or increased pain, loss or decreased feeling  in affected limb(s)    Complete by:  As directed    Call MD for:  temperature >100.5    Complete by:  As directed    Discharge patient    Complete by:  As directed    Discharge pt to home   Discharge wound care:    Complete by:  As directed    Wash the groin wound with soap and water daily and pat dry. (No tub bath-only shower)  Then put a dry gauze or washcloth there to keep this area dry daily and as needed.  Do not use Vaseline or neosporin on your incisions.  Only use soap and water on your incisions and then protect and keep dry.   Driving Restrictions    Complete by:  As directed    No driving for 2 weeks   Lifting restrictions    Complete by:  As directed    No lifting for 4 weeks   Resume previous diet    Complete by:  As directed        Medication List    TAKE these medications   acetaminophen 500 MG tablet Commonly known as:  TYLENOL Take 1,000 mg by mouth daily as needed (for leg pain).   aspirin EC 81 MG tablet Take 81 mg by mouth every evening.   clopidogrel 75 MG tablet Commonly known as:  PLAVIX Take 1 tablet (75 mg total) by mouth daily.   cyanocobalamin 1000 MCG/ML injection Commonly known as:  (VITAMIN B-12) Inject 1,000 mcg into the muscle every 30 (thirty) days.   docusate sodium 100 MG capsule Commonly known as:  COLACE Take 100 mg by mouth daily as needed (for constipation).   ferrous gluconate 324  MG tablet Commonly known as:  FERGON TAKE 1 TABLET BY MOUTH EVERY DAY WITH BREAKFAST   isosorbide mononitrate 30 MG 24 hr tablet Commonly known as:  IMDUR Take 1 tablet (30 mg total) by mouth daily.   losartan-hydrochlorothiazide 100-25 MG tablet Commonly known as:  HYZAAR Take 1 tablet by mouth every evening.   metoprolol 50 MG tablet Commonly known as:  LOPRESSOR TAKE 1 TABLET BY MOUTH DAILY   neomycin-colistin-hydrocortisone-thonzonium 3.01-04-09-0.5 MG/ML otic suspension Commonly known as:  CORTISPORIN TC Place 4 drops into the left ear 4 (four) times daily as needed (for itching).   NIFEdipine 90 MG 24 hr tablet Commonly known as:  PROCARDIA XL/ADALAT-CC TAKE 1 TABLET BY MOUTH EVERY DAY   oxyCODONE-acetaminophen 5-325 MG tablet Commonly known as:  ROXICET Take 1 tablet by mouth every 6 (six) hours as needed for moderate pain or severe pain.   oxyCODONE-acetaminophen 5-325 MG tablet Commonly known as:  PERCOCET/ROXICET Take 1-2 tablets by mouth every 6 (six) hours as needed for moderate pain.   ranitidine 150 MG tablet Commonly known as:  ZANTAC Take 150 mg by mouth daily as needed for heartburn.   simvastatin 20 MG tablet Commonly known as:  ZOCOR TAKE 1 TABLET BY MOUTH IN THE EVENING AFTER SUPPER     ASK your doctor about these medications   mupirocin ointment 2 % Commonly known as:  BACTROBAN Place 1 application into the nose 2 (two) times daily. Ask about: Should I take this medication?      Verbal and written Discharge instructions given to the patient. Wound care per Discharge AVS Follow-up Information    Annamarie Major, MD Follow up in 2 week(s).   Specialties:  Vascular Surgery, Cardiology Why:  Office will call you to arrange your appt (sent) Contact information: [address] [place] [address] [phone]        [facility] .   Specialty:  [place] Why:  [facility] was pre set up at vascular office before surgery Contact  information: [place] [phone]           Signed: Laurence Slate [facility] 08/14/2016, 10:15 AM - For VQI Registry use --- Instructions: Press F2 to tab through selections.  Delete question if not applicable.   Post-op:  Wound infection: No  Graft infection: No  Transfusion: No  If yes, 0 units given New Arrhythmia: No Ipsilateral amputation: [ x] no, [ ]  Minor, [ ]  BKA, [ ]  AKA Discharge patency: [x ] Primary, [ ]  Primary assisted, [ ]  Secondary, [ ]  Occluded Patency judged by: [ ]  Dopper only, [ ]  Palpable graft pulse, [x ] Palpable distal pulse, [ ]  ABI inc. > 0.15, [ ]  Duplex  D/C Ambulatory Status: Ambulatory  Complications: MI: [x ] No, [ ]  Troponin only, [ ]  EKG or Clinical CHF: No Resp failure: [x ] none, [ ]  Pneumonia, [ ]  Ventilator Chg in renal function: [x ] none, [ ]  Inc. Cr > 0.5, [ ]  Temp. Dialysis, [ ]  Permanent dialysis Stroke: [x ] None, [ ]  Minor, [ ]  Major Return to OR: No  Reason for return to OR: [ ]  Bleeding, [ ]  Infection, [ ]  Thrombosis, [ ]  Revision  Discharge medications: Statin use:  Yes ASA use:  Yes Plavix use:  Yes Beta Castillo use: Yes Coumadin use: No  for medical reason

## 2016-08-15 DIAGNOSIS — I2510 Atherosclerotic heart disease of native coronary artery without angina pectoris: Secondary | ICD-10-CM | POA: Diagnosis not present

## 2016-08-15 DIAGNOSIS — I739 Peripheral vascular disease, unspecified: Secondary | ICD-10-CM | POA: Diagnosis not present

## 2016-08-15 DIAGNOSIS — Z48812 Encounter for surgical aftercare following surgery on the circulatory system: Secondary | ICD-10-CM | POA: Diagnosis not present

## 2016-08-15 DIAGNOSIS — I10 Essential (primary) hypertension: Secondary | ICD-10-CM | POA: Diagnosis not present

## 2016-08-15 DIAGNOSIS — F17200 Nicotine dependence, unspecified, uncomplicated: Secondary | ICD-10-CM | POA: Diagnosis not present

## 2016-08-15 DIAGNOSIS — D500 Iron deficiency anemia secondary to blood loss (chronic): Secondary | ICD-10-CM | POA: Diagnosis not present

## 2016-08-16 DIAGNOSIS — D500 Iron deficiency anemia secondary to blood loss (chronic): Secondary | ICD-10-CM | POA: Diagnosis not present

## 2016-08-16 DIAGNOSIS — F17200 Nicotine dependence, unspecified, uncomplicated: Secondary | ICD-10-CM | POA: Diagnosis not present

## 2016-08-16 DIAGNOSIS — I2510 Atherosclerotic heart disease of native coronary artery without angina pectoris: Secondary | ICD-10-CM | POA: Diagnosis not present

## 2016-08-16 DIAGNOSIS — Z48812 Encounter for surgical aftercare following surgery on the circulatory system: Secondary | ICD-10-CM | POA: Diagnosis not present

## 2016-08-16 DIAGNOSIS — I10 Essential (primary) hypertension: Secondary | ICD-10-CM | POA: Diagnosis not present

## 2016-08-16 DIAGNOSIS — I739 Peripheral vascular disease, unspecified: Secondary | ICD-10-CM | POA: Diagnosis not present

## 2016-08-19 DIAGNOSIS — D500 Iron deficiency anemia secondary to blood loss (chronic): Secondary | ICD-10-CM | POA: Diagnosis not present

## 2016-08-19 DIAGNOSIS — F17200 Nicotine dependence, unspecified, uncomplicated: Secondary | ICD-10-CM | POA: Diagnosis not present

## 2016-08-19 DIAGNOSIS — I10 Essential (primary) hypertension: Secondary | ICD-10-CM | POA: Diagnosis not present

## 2016-08-19 DIAGNOSIS — I739 Peripheral vascular disease, unspecified: Secondary | ICD-10-CM | POA: Diagnosis not present

## 2016-08-19 DIAGNOSIS — Z48812 Encounter for surgical aftercare following surgery on the circulatory system: Secondary | ICD-10-CM | POA: Diagnosis not present

## 2016-08-19 DIAGNOSIS — I2510 Atherosclerotic heart disease of native coronary artery without angina pectoris: Secondary | ICD-10-CM | POA: Diagnosis not present

## 2016-08-21 DIAGNOSIS — Z48812 Encounter for surgical aftercare following surgery on the circulatory system: Secondary | ICD-10-CM | POA: Diagnosis not present

## 2016-08-21 DIAGNOSIS — D500 Iron deficiency anemia secondary to blood loss (chronic): Secondary | ICD-10-CM | POA: Diagnosis not present

## 2016-08-21 DIAGNOSIS — I2510 Atherosclerotic heart disease of native coronary artery without angina pectoris: Secondary | ICD-10-CM | POA: Diagnosis not present

## 2016-08-21 DIAGNOSIS — F17200 Nicotine dependence, unspecified, uncomplicated: Secondary | ICD-10-CM | POA: Diagnosis not present

## 2016-08-21 DIAGNOSIS — I739 Peripheral vascular disease, unspecified: Secondary | ICD-10-CM | POA: Diagnosis not present

## 2016-08-21 DIAGNOSIS — I10 Essential (primary) hypertension: Secondary | ICD-10-CM | POA: Diagnosis not present

## 2016-08-23 DIAGNOSIS — D500 Iron deficiency anemia secondary to blood loss (chronic): Secondary | ICD-10-CM | POA: Diagnosis not present

## 2016-08-23 DIAGNOSIS — I2510 Atherosclerotic heart disease of native coronary artery without angina pectoris: Secondary | ICD-10-CM | POA: Diagnosis not present

## 2016-08-23 DIAGNOSIS — Z48812 Encounter for surgical aftercare following surgery on the circulatory system: Secondary | ICD-10-CM | POA: Diagnosis not present

## 2016-08-23 DIAGNOSIS — I739 Peripheral vascular disease, unspecified: Secondary | ICD-10-CM | POA: Diagnosis not present

## 2016-08-23 DIAGNOSIS — F17200 Nicotine dependence, unspecified, uncomplicated: Secondary | ICD-10-CM | POA: Diagnosis not present

## 2016-08-23 DIAGNOSIS — I10 Essential (primary) hypertension: Secondary | ICD-10-CM | POA: Diagnosis not present

## 2016-08-27 DIAGNOSIS — I10 Essential (primary) hypertension: Secondary | ICD-10-CM | POA: Diagnosis not present

## 2016-08-27 DIAGNOSIS — F17200 Nicotine dependence, unspecified, uncomplicated: Secondary | ICD-10-CM | POA: Diagnosis not present

## 2016-08-27 DIAGNOSIS — I2510 Atherosclerotic heart disease of native coronary artery without angina pectoris: Secondary | ICD-10-CM | POA: Diagnosis not present

## 2016-08-27 DIAGNOSIS — I739 Peripheral vascular disease, unspecified: Secondary | ICD-10-CM | POA: Diagnosis not present

## 2016-08-27 DIAGNOSIS — D500 Iron deficiency anemia secondary to blood loss (chronic): Secondary | ICD-10-CM | POA: Diagnosis not present

## 2016-08-27 DIAGNOSIS — Z48812 Encounter for surgical aftercare following surgery on the circulatory system: Secondary | ICD-10-CM | POA: Diagnosis not present

## 2016-08-28 ENCOUNTER — Other Ambulatory Visit: Payer: Self-pay | Admitting: Cardiovascular Disease

## 2016-08-28 ENCOUNTER — Encounter: Payer: Self-pay | Admitting: Cardiovascular Disease

## 2016-08-28 ENCOUNTER — Ambulatory Visit (INDEPENDENT_AMBULATORY_CARE_PROVIDER_SITE_OTHER): Payer: Medicare Other | Admitting: Cardiovascular Disease

## 2016-08-28 VITALS — BP 156/54 | HR 54 | Ht 66.0 in | Wt 136.0 lb

## 2016-08-28 DIAGNOSIS — I739 Peripheral vascular disease, unspecified: Secondary | ICD-10-CM

## 2016-08-28 DIAGNOSIS — I70212 Atherosclerosis of native arteries of extremities with intermittent claudication, left leg: Secondary | ICD-10-CM

## 2016-08-28 DIAGNOSIS — I10 Essential (primary) hypertension: Secondary | ICD-10-CM | POA: Diagnosis not present

## 2016-08-28 DIAGNOSIS — I779 Disorder of arteries and arterioles, unspecified: Secondary | ICD-10-CM | POA: Diagnosis not present

## 2016-08-28 NOTE — Assessment & Plan Note (Signed)
History of carotid artery disease status post angiography by myself 03/17/14 revealing a 50% hypodense lesion in the right carotid bulb, 50% ostial left common carotid artery stenosis. Most recent Dopplers performed 04/25/16 were stable. We'll continue to follow him noninvasively on an annual basis.

## 2016-08-28 NOTE — Assessment & Plan Note (Signed)
History of peripheral arterial disease status post remote left to right femorofemoral crossover grafting by [name] May 2000. I performed diamondback orbital rotational atherectomy and stenting of his left SFA 03/17/14. Because of recurrent symptoms and a drop in his left ABI 0.4 I re-angiogram him 06/06/16 revealing a patent femorofemoral crossover graft with occluded left SFA at its origin reconstituting in the adductor canal by profunda femoris collaterals. There were 3 vessel runoff. He ultimately underwent elective left above-the-knee popliteal artery bypass grafting by Dr. [name] with 6 mm external ring PTFE on 08/09/16. His wounds are healing well. He has had some swelling in his left leg which is treating by elevation as well as some numbness in his foot. I'm going to get lower extremity arterial Doppler studies to follow him immediately post revascularization and serially after that.

## 2016-08-28 NOTE — Assessment & Plan Note (Signed)
History of hyperlipidemia on statin therapy with recent lipid profile performed 12/07/15. Total cholesterol 117, LDL 49 and HDL of 54.

## 2016-08-28 NOTE — Assessment & Plan Note (Signed)
History of coronary artery disease status post bypass grafting by [name] in 2000 for the LIMA to his LAD, RIMA to the circumflex provides marginal branch and a vein graft to the RCA. A Myoview stress test performed 08/23/12 was nonischemic. He denies chest pain or shortness of breath.

## 2016-08-28 NOTE — Assessment & Plan Note (Signed)
History of hypertension blood pressure measured 156/54. He is on metoprolol, losartan, hydrochlorothiazide and nifedipine. Continue current meds at current dosing.

## 2016-08-28 NOTE — Patient Instructions (Signed)
Medication Instructions:  Your physician recommends that you continue on your current medications as directed. Please refer to the Current Medication list given to you today.   Testing/Procedures: Your physician has requested that you have a lower extremity arterial doppler-now and every 6 months after that. During this test, ultrasound is used to evaluate arterial blood flow in the legs. Allow approximately one hour for this exam.   Your physician has requested that you have a carotid duplex in June 2018. This test is an ultrasound of the carotid arteries in your neck. It looks at blood flow through these arteries that supply the brain with blood. Allow one hour for this exam. There are no restrictions or special instructions.   Follow-Up: Your physician wants you to follow-up in: 12 months with [name]. You will receive a reminder letter in the mail two months in advance. If you don't receive a letter, please call our office to schedule the follow-up appointment.   If you need a refill on your cardiac medications before your next appointment, please call your pharmacy.

## 2016-08-28 NOTE — Progress Notes (Signed)
08/28/2016 Earl Castillo   June 29, 1941  [id number]  Primary Physician [name], MD Primary Cardiologist: [name] MD [name]  HPI:  The patient is a 75 year old white male previously followed by [name] and now followed by myself. I last saw him in the office 05/31/16. The last time I saw him was at the time of his intervention in July. His past medical history is significant for CAD, status post coronary artery bypass grafting in 2004 by [name], with a LIMA to his LAD, RIMA to the circumflex marginal and SVG to the distal RCA. He had a Myoview stress test performed 08/23/2012 which was low risk and nonischemic. He also has hypertension and hyperlipidemia. He has a 60+ year history of tobacco use and continues to smoke daily. He has significant peripheral vascular disease. He is status post right carotid endarterectomy performed by [name] in 2006. We have been following his carotid disease yearly by duplex ultrasound. He has also had a left to right fem-fem crossover graft by [name] in May of 2000. He was recently seen by [name] and complained of worsening claudication. Recent lower extremity arterial Doppler studies demonstrated progression of disease in his left common and external iliac arteries. In addition, it was suggested that he had moderately severe distal left SFA disease. Subsequently, on 03/17/2014 he underwent a diagnostic PV angiogram performed by [name]. His fem-fem crossover graft was widely patent. His left iliac also appeared widely patent, suggesting falsely elevated velocities by duplex. He did have a high grade focal calcified mid to distal left SFA stenosis corresponding to an area of abnormality and high velocity in the duplex ultrasound with three-vessel runoff. I performed diamondback orbital rotational atherectomy of his mid left SFA through his femorofemoral crossover graft 05/23/14 with stenting of his proximal left  SFA as well. His subsequent Dopplers revealed an increase in his left ABI from 0.8-1.0 . His claudication has resolved. Since I saw him in the office 03/07/16 he denies chest pain. He did have fairly new onset left lower extremity claudication over the last several months with recent Dopplers that showed decline in his left ABI down to 0.4 with an occluded left SFA. I performed angiography on him 06/06/16 revealing a widely patent left iliac system, left to right femorofemoral crossover graft with an occluded left SFA at the origin reconstituting in the adductor canal by profunda femoris collaterals with three-vessel runoff. He also underwent elective left above-the-knee femoropopliteal bypass grafting with a 6 dominant external ring PTFE graft by Dr. [name] 08/09/16. His wounds are healing well. His claudication is improved. He did have some swelling of his lower extremity which she is treating by leg elevation.   Current Outpatient Prescriptions  Medication Sig Dispense Refill  . acetaminophen (TYLENOL) 500 MG tablet Take 1,000 mg by mouth daily as needed (for leg pain).     Marland Kitchen aspirin EC 81 MG tablet Take 81 mg by mouth every evening.     . clopidogrel (PLAVIX) 75 MG tablet Take 1 tablet (75 mg total) by mouth daily. 30 tablet 11  . cyanocobalamin (,VITAMIN B-12,) 1000 MCG/ML injection Inject 1,000 mcg into the muscle every 30 (thirty) days.     Marland Kitchen docusate sodium (COLACE) 100 MG capsule Take 100 mg by mouth daily as needed (for constipation).     . ferrous gluconate (FERGON) 324 MG tablet TAKE 1 TABLET BY MOUTH EVERY DAY WITH BREAKFAST 30 tablet  11  . isosorbide mononitrate (IMDUR) 30 MG 24 hr tablet Take 1 tablet (30 mg total) by mouth daily. 30 tablet 11  . losartan-hydrochlorothiazide (HYZAAR) 100-25 MG tablet Take 1 tablet by mouth every evening.    . metoprolol (LOPRESSOR) 50 MG tablet TAKE 1 TABLET BY MOUTH DAILY 30 tablet 5  . neomycin-colistin-hydrocortisone-thonzonium (CORTISPORIN TC)  3.01-04-09-0.5 MG/ML otic suspension Place 4 drops into the left ear 4 (four) times daily as needed (for itching). 10 mL 0  . NIFEdipine (PROCARDIA XL/ADALAT-CC) 90 MG 24 hr tablet TAKE 1 TABLET BY MOUTH EVERY DAY 90 tablet 3  . oxyCODONE-acetaminophen (PERCOCET/ROXICET) 5-325 MG tablet Take 1-2 tablets by mouth every 6 (six) hours as needed for moderate pain. 15 tablet 0  . oxyCODONE-acetaminophen (ROXICET) 5-325 MG tablet Take 1 tablet by mouth every 6 (six) hours as needed for moderate pain or severe pain. 20 tablet 0  . ranitidine (ZANTAC) 150 MG tablet Take 150 mg by mouth daily as needed for heartburn.    . simvastatin (ZOCOR) 20 MG tablet TAKE 1 TABLET BY MOUTH IN THE EVENING AFTER SUPPER 90 tablet 3   No current facility-administered medications for this visit.     No Known Allergies  Social History   Social History  . Marital status: Married    Spouse name: N/A  . Number of children: 2  . Years of education: N/A   Occupational History  .  Semi Retired Geophysicist/field seismologist    Social History Main Topics  . Smoking status: Current Some Day Smoker    Packs/day: 0.25    Years: 58.00    Types: Cigarettes  . Smokeless tobacco: Never Used     Comment: 3 cigarettes every couple of days  . Alcohol use No  . Drug use: No  . Sexual activity: Not Currently   Other Topics Concern  . Not on file   Social History Narrative   Daily caffeine      Review of Systems: General: negative for chills, fever, night sweats or weight changes.  Cardiovascular: negative for chest pain, dyspnea on exertion, edema, orthopnea, palpitations, paroxysmal nocturnal dyspnea or shortness of breath Dermatological: negative for rash Respiratory: negative for cough or wheezing Urologic: negative for hematuria Abdominal: negative for nausea, vomiting, diarrhea, bright red blood per rectum, melena, or hematemesis Neurologic: negative for visual changes, syncope, or dizziness All other systems reviewed and are  otherwise negative except as noted above.    Blood pressure (!) 156/54, pulse (!) 54, height 5\' 6"  (1.676 m), weight 136 lb (61.7 kg).  General appearance: alert and no distress Neck: no adenopathy, no JVD, supple, symmetrical, trachea midline, thyroid not enlarged, symmetric, no tenderness/mass/nodules and Soft bilateral carotid bruits Lungs: clear to auscultation bilaterally Heart: regular rate and rhythm, S1, S2 normal, no murmur, click, rub or gallop Extremities: 1-2+ swelling left lower extremity  EKG not performed today  ASSESSMENT AND PLAN:   Hyperlipidemia History of hyperlipidemia on statin therapy with recent lipid profile performed 12/07/15. Total cholesterol 117, LDL 49 and HDL of 54.  Coronary atherosclerosis History of coronary artery disease status post bypass grafting by [name] in 2000 for the LIMA to his LAD, RIMA to the circumflex provides marginal branch and a vein graft to the RCA. A Myoview stress test performed 08/23/12 was nonischemic. He denies chest pain or shortness of breath.  PVD- Lt SFA PTA/stent 05/23/14 History of peripheral arterial disease status post remote left to right femorofemoral crossover grafting by [name] May 2000.  I performed diamondback orbital rotational atherectomy and stenting of his left SFA 03/17/14. Because of recurrent symptoms and a drop in his left ABI 0.4 I re-angiogram him 06/06/16 revealing a patent femorofemoral crossover graft with occluded left SFA at its origin reconstituting in the adductor canal by profunda femoris collaterals. There were 3 vessel runoff. He ultimately underwent elective left above-the-knee popliteal artery bypass grafting by Dr. [name] with 6 mm external ring PTFE on 08/09/16. His wounds are healing well. He has had some swelling in his left leg which is treating by elevation as well as some numbness in his foot. I'm going to get lower extremity arterial Doppler studies to follow him immediately post  revascularization and serially after that.  Carotid artery disease History of carotid artery disease status post angiography by myself 03/17/14 revealing a 50% hypodense lesion in the right carotid bulb, 50% ostial left common carotid artery stenosis. Most recent Dopplers performed 04/25/16 were stable. We'll continue to follow him noninvasively on an annual basis.  Essential hypertension History of hypertension blood pressure measured 156/54. He is on metoprolol, losartan, hydrochlorothiazide and nifedipine. Continue current meds at current dosing.      [name] MD FACP,FACC,FAHA, [facility] 08/28/2016 10:32 AM

## 2016-08-29 ENCOUNTER — Encounter: Payer: Self-pay | Admitting: Surgery

## 2016-08-29 DIAGNOSIS — F17200 Nicotine dependence, unspecified, uncomplicated: Secondary | ICD-10-CM | POA: Diagnosis not present

## 2016-08-29 DIAGNOSIS — I2510 Atherosclerotic heart disease of native coronary artery without angina pectoris: Secondary | ICD-10-CM | POA: Diagnosis not present

## 2016-08-29 DIAGNOSIS — I739 Peripheral vascular disease, unspecified: Secondary | ICD-10-CM | POA: Diagnosis not present

## 2016-08-29 DIAGNOSIS — D500 Iron deficiency anemia secondary to blood loss (chronic): Secondary | ICD-10-CM | POA: Diagnosis not present

## 2016-08-29 DIAGNOSIS — Z48812 Encounter for surgical aftercare following surgery on the circulatory system: Secondary | ICD-10-CM | POA: Diagnosis not present

## 2016-08-29 DIAGNOSIS — I10 Essential (primary) hypertension: Secondary | ICD-10-CM | POA: Diagnosis not present

## 2016-08-30 ENCOUNTER — Ambulatory Visit (INDEPENDENT_AMBULATORY_CARE_PROVIDER_SITE_OTHER): Payer: Medicare Other | Admitting: Family Medicine

## 2016-08-30 ENCOUNTER — Ambulatory Visit: Payer: Medicare Other | Admitting: Family Medicine

## 2016-08-30 DIAGNOSIS — E538 Deficiency of other specified B group vitamins: Secondary | ICD-10-CM

## 2016-08-30 MED ORDER — CYANOCOBALAMIN 1000 MCG/ML IJ SOLN
1000.0000 ug | Freq: Once | INTRAMUSCULAR | Status: AC
  Administered 2016-08-30: 1000 ug via INTRAMUSCULAR

## 2016-09-02 ENCOUNTER — Encounter: Payer: Self-pay | Admitting: Surgery

## 2016-09-02 ENCOUNTER — Ambulatory Visit (INDEPENDENT_AMBULATORY_CARE_PROVIDER_SITE_OTHER): Payer: Medicare Other | Admitting: Surgery

## 2016-09-02 VITALS — BP 151/65 | HR 61 | Temp 97.7°F | Resp 24 | Ht 66.0 in | Wt 134.7 lb

## 2016-09-02 DIAGNOSIS — I70212 Atherosclerosis of native arteries of extremities with intermittent claudication, left leg: Secondary | ICD-10-CM

## 2016-09-02 DIAGNOSIS — F17200 Nicotine dependence, unspecified, uncomplicated: Secondary | ICD-10-CM | POA: Diagnosis not present

## 2016-09-02 DIAGNOSIS — I739 Peripheral vascular disease, unspecified: Secondary | ICD-10-CM | POA: Diagnosis not present

## 2016-09-02 DIAGNOSIS — Z48812 Encounter for surgical aftercare following surgery on the circulatory system: Secondary | ICD-10-CM | POA: Diagnosis not present

## 2016-09-02 DIAGNOSIS — I2510 Atherosclerotic heart disease of native coronary artery without angina pectoris: Secondary | ICD-10-CM | POA: Diagnosis not present

## 2016-09-02 DIAGNOSIS — I10 Essential (primary) hypertension: Secondary | ICD-10-CM | POA: Diagnosis not present

## 2016-09-02 DIAGNOSIS — D500 Iron deficiency anemia secondary to blood loss (chronic): Secondary | ICD-10-CM | POA: Diagnosis not present

## 2016-09-02 NOTE — Progress Notes (Signed)
Patient name: Earl Castillo MRN: [id number] DOB: [birth year] Sex: male  REASON FOR VISIT: post-op  HPI: Earl Castillo is a 75 y.o. male who returns today for follow-up.  On 08/09/2016 he underwent a left femoral to above-knee popliteal artery bypass graft with 6 mm external ring propatent PTFE.  The patient has a history of a left-to-right femoral-femoral bypass graft by [name] in 2000.  This was done for an occluded right common iliac artery.  He has undergone multiple percutaneous interventions by [name] in the past which ultimately failed.  He was having claudication at approximately 50 yards.  His postoperative course was uncomplicated.  His biggest complaint is that of leg swelling and some numbness which are both getting better.  Current Outpatient Prescriptions  Medication Sig Dispense Refill  . acetaminophen (TYLENOL) 500 MG tablet Take 1,000 mg by mouth daily as needed (for leg pain).     Marland Kitchen aspirin EC 81 MG tablet Take 81 mg by mouth every evening.     . clopidogrel (PLAVIX) 75 MG tablet Take 1 tablet (75 mg total) by mouth daily. 30 tablet 11  . cyanocobalamin (,VITAMIN B-12,) 1000 MCG/ML injection Inject 1,000 mcg into the muscle every 30 (thirty) days.     Marland Kitchen docusate sodium (COLACE) 100 MG capsule Take 100 mg by mouth daily as needed (for constipation).     . ferrous gluconate (FERGON) 324 MG tablet TAKE 1 TABLET BY MOUTH EVERY DAY WITH BREAKFAST 30 tablet 11  . isosorbide mononitrate (IMDUR) 30 MG 24 hr tablet Take 1 tablet (30 mg total) by mouth daily. 30 tablet 11  . losartan-hydrochlorothiazide (HYZAAR) 100-25 MG tablet Take 1 tablet by mouth every evening.    . metoprolol (LOPRESSOR) 50 MG tablet TAKE 1 TABLET BY MOUTH DAILY 30 tablet 5  . neomycin-colistin-hydrocortisone-thonzonium (CORTISPORIN TC) 3.01-04-09-0.5 MG/ML otic suspension Place 4 drops into the left ear 4 (four) times daily as needed (for itching). 10 mL 0  . NIFEdipine  (PROCARDIA XL/ADALAT-CC) 90 MG 24 hr tablet TAKE 1 TABLET BY MOUTH EVERY DAY 90 tablet 3  . oxyCODONE-acetaminophen (PERCOCET/ROXICET) 5-325 MG tablet Take 1-2 tablets by mouth every 6 (six) hours as needed for moderate pain. 15 tablet 0  . oxyCODONE-acetaminophen (ROXICET) 5-325 MG tablet Take 1 tablet by mouth every 6 (six) hours as needed for moderate pain or severe pain. 20 tablet 0  . ranitidine (ZANTAC) 150 MG tablet Take 150 mg by mouth daily as needed for heartburn.    . simvastatin (ZOCOR) 20 MG tablet TAKE 1 TABLET BY MOUTH IN THE EVENING AFTER SUPPER 90 tablet 3   No current facility-administered medications for this visit.     REVIEW OF SYSTEMS:  [X]  denotes positive finding, [ ]  denotes negative finding Cardiac  Comments:  Chest pain or chest pressure:    Shortness of breath upon exertion:    Short of breath when lying flat:    Irregular heart rhythm:    Constitutional    Fever or chills:      PHYSICAL EXAM: Vitals:   09/02/16 0824 09/02/16 0827  BP: (!) 159/69 (!) 151/65  Pulse: 61   Resp: (!) 24   Temp: 97.7 F (36.5 C)   TempSrc: Oral   SpO2: 95%   Weight: 134 lb 11.2 oz (61.1 kg)   Height: 5\' 6"  (1.676 m)     GENERAL: The patient is a well-nourished male, in no acute distress. The vital signs are documented above. CARDIOVASCULAR: There is  a regular rate and rhythm. PULMONARY: There is good air exchange bilaterally without wheezing or rales. Palpable femoral pulse. Trace lower extremity edema Incisions are healing nicely  MEDICAL ISSUES: The patient is recovering as expected.  I have encouraged him to keep his legs elevated and potentially to wear compression stockings if the swelling persists, I reassured him that it should improve with time.  In addition the numbness that he is experiencing which is also getting better should continue to improve over time.  He is scheduled to get lower extremity surveillance Doppler studies with [name].  I will see him  in 3 months for follow-up.  Annamarie Major, MD [facility] [phone] Pager [phone]

## 2016-09-03 DIAGNOSIS — D500 Iron deficiency anemia secondary to blood loss (chronic): Secondary | ICD-10-CM | POA: Diagnosis not present

## 2016-09-03 DIAGNOSIS — Z48812 Encounter for surgical aftercare following surgery on the circulatory system: Secondary | ICD-10-CM | POA: Diagnosis not present

## 2016-09-03 DIAGNOSIS — I739 Peripheral vascular disease, unspecified: Secondary | ICD-10-CM | POA: Diagnosis not present

## 2016-09-03 DIAGNOSIS — I10 Essential (primary) hypertension: Secondary | ICD-10-CM | POA: Diagnosis not present

## 2016-09-03 DIAGNOSIS — I2510 Atherosclerotic heart disease of native coronary artery without angina pectoris: Secondary | ICD-10-CM | POA: Diagnosis not present

## 2016-09-03 DIAGNOSIS — F17200 Nicotine dependence, unspecified, uncomplicated: Secondary | ICD-10-CM | POA: Diagnosis not present

## 2016-09-04 ENCOUNTER — Other Ambulatory Visit: Payer: Self-pay

## 2016-09-04 MED ORDER — METOPROLOL TARTRATE 50 MG PO TABS: 50.0000 mg | ORAL_TABLET | Freq: Every day | ORAL | 1 refills | Status: DC

## 2016-09-04 MED ORDER — FERROUS GLUCONATE 324 (38 FE) MG PO TABS: ORAL_TABLET | ORAL | 0 refills | Status: DC

## 2016-09-05 DIAGNOSIS — F17200 Nicotine dependence, unspecified, uncomplicated: Secondary | ICD-10-CM | POA: Diagnosis not present

## 2016-09-05 DIAGNOSIS — Z48812 Encounter for surgical aftercare following surgery on the circulatory system: Secondary | ICD-10-CM | POA: Diagnosis not present

## 2016-09-05 DIAGNOSIS — I10 Essential (primary) hypertension: Secondary | ICD-10-CM | POA: Diagnosis not present

## 2016-09-05 DIAGNOSIS — I2510 Atherosclerotic heart disease of native coronary artery without angina pectoris: Secondary | ICD-10-CM | POA: Diagnosis not present

## 2016-09-05 DIAGNOSIS — D500 Iron deficiency anemia secondary to blood loss (chronic): Secondary | ICD-10-CM | POA: Diagnosis not present

## 2016-09-05 DIAGNOSIS — I739 Peripheral vascular disease, unspecified: Secondary | ICD-10-CM | POA: Diagnosis not present

## 2016-09-20 ENCOUNTER — Ambulatory Visit (HOSPITAL_COMMUNITY)
Admission: RE | Admit: 2016-09-20 | Discharge: 2016-09-20 | Disposition: A | Discharge status: Home / Self Care | Payer: Medicare Other | Source: Ambulatory Visit | Attending: Cardiovascular Disease | Admitting: Cardiovascular Disease

## 2016-09-20 DIAGNOSIS — Z87891 Personal history of nicotine dependence: Secondary | ICD-10-CM | POA: Insufficient documentation

## 2016-09-20 DIAGNOSIS — I2510 Atherosclerotic heart disease of native coronary artery without angina pectoris: Secondary | ICD-10-CM | POA: Diagnosis not present

## 2016-09-20 DIAGNOSIS — Z95828 Presence of other vascular implants and grafts: Secondary | ICD-10-CM | POA: Insufficient documentation

## 2016-09-20 DIAGNOSIS — I739 Peripheral vascular disease, unspecified: Secondary | ICD-10-CM | POA: Diagnosis not present

## 2016-09-20 DIAGNOSIS — E785 Hyperlipidemia, unspecified: Secondary | ICD-10-CM | POA: Insufficient documentation

## 2016-09-20 DIAGNOSIS — I10 Essential (primary) hypertension: Secondary | ICD-10-CM | POA: Insufficient documentation

## 2016-09-20 DIAGNOSIS — Z95820 Peripheral vascular angioplasty status with implants and grafts: Secondary | ICD-10-CM | POA: Insufficient documentation

## 2016-09-23 ENCOUNTER — Telehealth: Payer: Self-pay | Admitting: Cardiovascular Disease

## 2016-09-23 DIAGNOSIS — I739 Peripheral vascular disease, unspecified: Secondary | ICD-10-CM

## 2016-09-23 NOTE — Telephone Encounter (Signed)
-----   Message from [name], MD sent at 09/22/2016  9:58 AM EST ----- S/P LFPBG with improved LABI. Repeat 6 months

## 2016-09-23 NOTE — Telephone Encounter (Signed)
Results given to pt. Pt verbalized understanding. Repeat dopplers ordered.

## 2016-10-03 ENCOUNTER — Ambulatory Visit (INDEPENDENT_AMBULATORY_CARE_PROVIDER_SITE_OTHER): Payer: Medicare Other | Admitting: Family Medicine

## 2016-10-03 DIAGNOSIS — E538 Deficiency of other specified B group vitamins: Secondary | ICD-10-CM | POA: Diagnosis not present

## 2016-10-03 MED ORDER — CYANOCOBALAMIN 1000 MCG/ML IJ SOLN
1000.0000 ug | Freq: Once | INTRAMUSCULAR | Status: AC
  Administered 2016-10-03: 1000 ug via INTRAMUSCULAR

## 2016-10-08 ENCOUNTER — Other Ambulatory Visit: Payer: Self-pay | Admitting: Family Medicine

## 2016-10-08 NOTE — Telephone Encounter (Signed)
Should pt be taking this medication?

## 2016-10-16 DIAGNOSIS — S60512A Abrasion of left hand, initial encounter: Secondary | ICD-10-CM | POA: Diagnosis not present

## 2016-10-17 ENCOUNTER — Ambulatory Visit (INDEPENDENT_AMBULATORY_CARE_PROVIDER_SITE_OTHER): Payer: Medicare Other | Admitting: Family Medicine

## 2016-10-17 ENCOUNTER — Encounter: Payer: Self-pay | Admitting: Family Medicine

## 2016-10-17 VITALS — BP 159/67 | HR 50 | Temp 97.9°F | Resp 20 | Wt 137.5 lb

## 2016-10-17 DIAGNOSIS — I70212 Atherosclerosis of native arteries of extremities with intermittent claudication, left leg: Secondary | ICD-10-CM | POA: Diagnosis not present

## 2016-10-17 DIAGNOSIS — S6992XA Unspecified injury of left wrist, hand and finger(s), initial encounter: Secondary | ICD-10-CM | POA: Diagnosis not present

## 2016-10-17 NOTE — Progress Notes (Signed)
Earl Castillo , Dec 31, 1940, 75 y.o., male MRN: [id number] Patient Care Team    Relationship Specialty Notifications Start End  [name], MD PCP - General   10/17/10    Comment: Merged  [name], MD Consulting Physician Cardiology  07/29/16     CC: left hand injury Subjective:  Patient states he scrapped his hand 2 days ago and placed a band-aid over the area because it was bleeding. He states he has really thin skin and bleeds really easy since on plavix and ASA 81 mg QD. When he went to take of the band-aid yesterday, the skin ripped off with the adhesive portion of bandaid. [facility] and they placed a pressure dressing over the area and told him to followup with PCP. He states he has not noticed any bleeding through the bandage.   No Known Allergies Social History  Substance Use Topics  . Smoking status: Current Some Day Smoker    Packs/day: 0.25    Years: 58.00    Types: Cigarettes  . Smokeless tobacco: Never Used     Comment: 3 cigarettes every couple of days  . Alcohol use No   Past Medical History:  Diagnosis Date  . Anemia   . Angiodysplasia of intestine - small bowel  09/21/2013  . Blood transfusion abn reaction or complication, no procedure mishap    "HgB dropped real low"  . Cancer [facility])    Skin cancers  . Carotid artery disease ([place])    status post carotid endarterectomy by [name]  . Chicken pox   . Colon polyps 08/2012   Hyperplastic and adenomatous sessile polyps  . Coronary artery disease    CABG'04, Low risk Myoview 2013  . GERD (gastroesophageal reflux disease)   . Hemorrhoid   . History of blood transfusion 08/2013   "HgB dropped real low; related to bleeding from my rectum"  . History of kidney stones    1 removed surgical, passed 2  . Hyperlipidemia   . Hypertension   . 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 Lower GI  bleeding 08/2013  . Osteoarthritis    "hips" (05/23/2014)  . Peripheral arterial disease ([place])    status post left to right fem-fem crossover graft in by [name]  . Pneumonia    - "years ago"  . Sinus headache    "alot" (05/23/2014)  . Skin exam, screening for cancer    sees [name]   . Vitamin B12 deficiency 11/23/2013   Past Surgical History:  Procedure Laterality Date  . BAND HEMORRHOIDECTOMY    . BASAL CELL CARCINOMA EXCISION  May 2009   per [name] , left nose   . basket extraction of ureteral stone  1970's  . Bypass rt leg Right 2000  . capsule endoscopy  10-05-13   per [name], 4 AVM's  . CARDIAC CATHETERIZATION  08/18/2003   Recommend CABG  . CARDIOVASCULAR STRESS TEST - LEXISCAN  08/25/2012   No significant wall motion abnormalities  . CAROTID ANGIOGRAM N/A 03/17/2014   Procedure: CAROTID ANGIOGRAM;  Surgeon: [name], MD;  Location: [facility] CATH LAB;  Service: Cardiovascular;  Laterality: N/A;  right carotid  . CAROTID ENDARTERECTOMY Right   . CAROTID ENDARTERECTOMY Right 03/26/2005   Dr Georgina Quint  . CATARACT EXTRACTION W/ INTRAOCULAR LENS  IMPLANT, BILATERAL Bilateral 2014  per [name]  . [place]   "bulging disc"  . COLONOSCOPY  08-12-12   per [name], tubular adenomas, repeat in 5 yrs   . CORONARY ARTERY BYPASS GRAFT  08/30/2003   x3. LIMA-distal LAD, RIMA-circumflex, SVG-distal RCA  . CYSTOSCOPY W/ STONE MANIPULATION    . ESOPHAGOGASTRODUODENOSCOPY N/A 08/17/2013   Procedure: ESOPHAGOGASTRODUODENOSCOPY (EGD);  Surgeon: [name], MD;  Location: [facility] ENDOSCOPY;  Service: Endoscopy;  Laterality: N/A;  . FEMORAL ARTERY STENT Left 05/23/2014  . FEMORAL-FEMORAL BYPASS GRAFT  2003  . FEMORAL-POPLITEAL BYPASS GRAFT Left 08/09/2016   Procedure: BYPASS GRAFT LEFT FEMORAL-POPLITEAL ARTERY USING GORE 6MM X 80CM PROPATEN GRAFT;  Surgeon: Serafina Mitchell, MD;  Location: [place];  Service: Vascular;  Laterality: Left;  .  LOWER EXTREMITY ANGIOGRAM N/A 03/17/2014   Procedure: LOWER EXTREMITY ANGIOGRAM;  Surgeon: [name], MD;  Location: [facility] CATH LAB;  Service: Cardiovascular;  Laterality: N/A;  . PERIPHERAL VASCULAR CATHETERIZATION N/A 06/06/2016   Procedure: Lower Extremity Angiography;  Surgeon: [name], MD;  Location: [place] CV LAB;  Service: Cardiovascular;  Laterality: N/A;  . PERIPHERAL VASCULAR CATHETERIZATION N/A 06/06/2016   Procedure: Abdominal Aortogram;  Surgeon: [name], MD;  Location: [place] CV LAB;  Service: Cardiovascular;  Laterality: N/A;  . RENAL DUPLEX Bilateral 02/02/2013   Bilateral renal arteries demonstrate narrowingconsistent with a 60-99% diameter reduction.  . TRANSTHORACIC ECHOCARDIOGRAM  08/02/2013   EF 55-60%, normal echocardiogram   Family History  Problem Relation Age of Onset  . Heart disease Father   . Heart disease Sister   . Coronary artery disease Brother     CABG x 6  . Heart disease Brother   . Coronary artery disease Brother     first MI 14, died at 77 during cardiac surgery  . Coronary artery disease Sister     CABG x 5, RAS  . Heart disease Sister   . Colon cancer Neg Hx      Medication List       Accurate as of 10/17/16  1:04 PM. Always use your most recent med list.          acetaminophen 500 MG tablet Commonly known as:  TYLENOL Take 1,000 mg by mouth daily as needed (for leg pain).   aspirin EC 81 MG tablet Take 81 mg by mouth every evening.   clopidogrel 75 MG tablet Commonly known as:  PLAVIX Take 1 tablet (75 mg total) by mouth daily.   cyanocobalamin 1000 MCG/ML injection Commonly known as:  (VITAMIN B-12) Inject 1,000 mcg into the muscle every 30 (thirty) days.   docusate sodium 100 MG capsule Commonly known as:  COLACE Take 100 mg by mouth daily as needed (for constipation).   ferrous gluconate 324 MG tablet Commonly known as:  FERGON TAKE 1 TABLET BY MOUTH EVERY DAY WITH BREAKFAST   isosorbide  mononitrate 30 MG 24 hr tablet Commonly known as:  IMDUR Take 1 tablet (30 mg total) by mouth daily.   losartan-hydrochlorothiazide 100-25 MG tablet Commonly known as:  HYZAAR Take 1 tablet by mouth every evening.   losartan-hydrochlorothiazide 100-25 MG tablet Commonly known as:  HYZAAR TAKE 1 TABLET BY MOUTH DAILY.   metoprolol 50 MG tablet Commonly known as:  LOPRESSOR Take 1 tablet (50 mg total) by mouth daily.   mupirocin ointment 2 % Commonly known as:  BACTROBAN   neomycin-colistin-hydrocortisone-thonzonium 3.01-04-09-0.5 MG/ML otic suspension Commonly known as:  CORTISPORIN TC Place  4 drops into the left ear 4 (four) times daily as needed (for itching).   NIFEdipine 90 MG 24 hr tablet Commonly known as:  PROCARDIA XL/ADALAT-CC TAKE 1 TABLET BY MOUTH EVERY DAY   oxyCODONE-acetaminophen 5-325 MG tablet Commonly known as:  ROXICET Take 1 tablet by mouth every 6 (six) hours as needed for moderate pain or severe pain.   oxyCODONE-acetaminophen 5-325 MG tablet Commonly known as:  PERCOCET/ROXICET Take 1-2 tablets by mouth every 6 (six) hours as needed for moderate pain.   ranitidine 150 MG tablet Commonly known as:  ZANTAC Take 150 mg by mouth daily as needed for heartburn.   simvastatin 20 MG tablet Commonly known as:  ZOCOR TAKE 1 TABLET BY MOUTH IN THE EVENING AFTER SUPPER       No results found for this or any previous visit (from the past 24 hour(s)). No results found.   ROS: Negative, with the exception of above mentioned in HPI   Objective:  BP (!) 159/67 (BP Location: Right Arm, Patient Position: Sitting, Cuff Size: Normal)   Pulse (!) 50   Temp 97.9 F (36.6 C)   Resp 20   Wt 137 lb 8 oz (62.4 kg)   SpO2 97%   BMI 22.19 kg/m  Body mass index is 22.19 kg/m. Gen: Afebrile. No acute distress. Nontoxic in appearance, well developed, well nourished.  Skin: ~2 cm elliptical tear of skin. Left posterior hand, no bleeding, swelling, redness or  drainage.   Assessment/Plan: Earl Castillo is a 75 y.o. male present for acute OV for  Injury of left hand, initial encounter - no bleeding. No signs of infection.  - re-wrapped with abx ointment, telfa and coban. Instructed pt on supplies and encouraged him to use coban instead of bandaids in the future.  - instruction on proper application.  - wound care instructions provided.  - F/u PRN  electronically signed by:  Howard Pouch, DO  [place]

## 2016-10-17 NOTE — Patient Instructions (Signed)
It was a pleasure meeting you today.  Your wound looks ok, no longer bleeding.  Keep area clean, apply ointment and clean dressing daily. Keep dry.  Use the COBAN and TELFA for dressing. These do not stick to your skin. Just make sure the coban is not tight, like I showed you today.    I hope you have a great holiday.

## 2016-10-30 ENCOUNTER — Encounter: Payer: Self-pay | Admitting: Adult Health

## 2016-10-30 ENCOUNTER — Ambulatory Visit (INDEPENDENT_AMBULATORY_CARE_PROVIDER_SITE_OTHER): Payer: Medicare Other | Admitting: Adult Health

## 2016-10-30 VITALS — BP 160/58 | HR 56 | Temp 97.7°F | Wt 135.3 lb

## 2016-10-30 DIAGNOSIS — I70212 Atherosclerosis of native arteries of extremities with intermittent claudication, left leg: Secondary | ICD-10-CM | POA: Diagnosis not present

## 2016-10-30 DIAGNOSIS — J358 Other chronic diseases of tonsils and adenoids: Secondary | ICD-10-CM | POA: Diagnosis not present

## 2016-10-30 DIAGNOSIS — J0111 Acute recurrent frontal sinusitis: Secondary | ICD-10-CM

## 2016-10-30 DIAGNOSIS — J209 Acute bronchitis, unspecified: Secondary | ICD-10-CM | POA: Diagnosis not present

## 2016-10-30 DIAGNOSIS — E538 Deficiency of other specified B group vitamins: Secondary | ICD-10-CM

## 2016-10-30 LAB — POCT RAPID STREP A (OFFICE): Rapid Strep A Screen: NEGATIVE

## 2016-10-30 MED ORDER — AMOXICILLIN-POT CLAVULANATE 875-125 MG PO TABS: 1.0000 | ORAL_TABLET | Freq: Two times a day (BID) | ORAL | 0 refills | Status: DC

## 2016-10-30 MED ORDER — METHYLPREDNISOLONE 4 MG PO TBPK: ORAL_TABLET | ORAL | 0 refills | Status: DC

## 2016-10-30 MED ORDER — CYANOCOBALAMIN 1000 MCG/ML IJ SOLN
1000.0000 ug | Freq: Once | INTRAMUSCULAR | Status: AC
  Administered 2016-10-30: 1000 ug via INTRAMUSCULAR

## 2016-10-30 NOTE — Progress Notes (Signed)
Pre visit review using our clinic review tool, if applicable. No additional management support is needed unless otherwise documented below in the visit note. 

## 2016-10-30 NOTE — Progress Notes (Signed)
Subjective:    Patient ID: Earl Castillo, male    DOB: 03-19-1941, 75 y.o.   MRN: [id number]  Sinusitis  This is a new problem. The current episode started in the past 7 days. There has been no fever. Associated symptoms include congestion, coughing (productive ), sinus pressure and sneezing. Treatments tried: cough drops    Review of Systems  Constitutional: Negative.   HENT: Positive for congestion, rhinorrhea, sinus pain, sinus pressure and sneezing.   Respiratory: Positive for cough (productive ) and wheezing.   Cardiovascular: Negative.   Gastrointestinal: Negative.   Genitourinary: Negative.   Musculoskeletal: Negative.   Skin: Negative.   Neurological: Negative.   All other systems reviewed and are negative.  Past Medical History:  Diagnosis Date  . Anemia   . Angiodysplasia of intestine - small bowel  09/21/2013  . Blood transfusion abn reaction or complication, no procedure mishap    "HgB dropped real low"  . Cancer [facility])    Skin cancers  . Carotid artery disease ([place])    status post carotid endarterectomy by [name]  . Chicken pox   . Colon polyps 08/2012   Hyperplastic and adenomatous sessile polyps  . Coronary artery disease    CABG'04, Low risk Myoview 2013  . GERD (gastroesophageal reflux disease)   . Hemorrhoid   . History of blood transfusion 08/2013   "HgB dropped real low; related to bleeding from my rectum"  . History of kidney stones    1 removed surgical, passed 2  . Hyperlipidemia   . Hypertension   . 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 Lower GI bleeding 08/2013  . Osteoarthritis    "hips" (05/23/2014)  . Peripheral arterial disease ([place])    status post left to right fem-fem crossover graft in by [name]  . Pneumonia    - "years ago"  . Sinus headache    "alot" (05/23/2014)  . Skin exam, screening for cancer    sees [name]   . Vitamin B12 deficiency  11/23/2013    Social History   Social History  . Marital status: Married    Spouse name: N/A  . Number of children: 2  . Years of education: N/A   Occupational History  .  Semi Retired Geophysicist/field seismologist    Social History Main Topics  . Smoking status: Current Some Day Smoker    Packs/day: 0.25    Years: 58.00    Types: Cigarettes  . Smokeless tobacco: Never Used     Comment: 3 cigarettes every couple of days  . Alcohol use No  . Drug use: No  . Sexual activity: Not Currently   Other Topics Concern  . Not on file   Social History Narrative   Daily caffeine     Past Surgical History:  Procedure Laterality Date  . BAND HEMORRHOIDECTOMY    . BASAL CELL CARCINOMA EXCISION  May 2009   per [name] , left nose   . basket extraction of ureteral stone  1970's  . Bypass rt leg Right 2000  . capsule endoscopy  10-05-13   per [name], 4 AVM's  . CARDIAC CATHETERIZATION  08/18/2003   Recommend CABG  . CARDIOVASCULAR STRESS TEST - LEXISCAN  08/25/2012   No significant wall motion abnormalities  . CAROTID ANGIOGRAM N/A 03/17/2014   Procedure: CAROTID ANGIOGRAM;  Surgeon: [name], MD;  Location: [facility] CATH LAB;  Service:  Cardiovascular;  Laterality: N/A;  right carotid  . CAROTID ENDARTERECTOMY Right   . CAROTID ENDARTERECTOMY Right 03/26/2005   Dr Georgina Quint  . CATARACT EXTRACTION W/ INTRAOCULAR LENS  IMPLANT, BILATERAL Bilateral 2014   per [name]  . [place]   "bulging disc"  . COLONOSCOPY  08-12-12   per [name], tubular adenomas, repeat in 5 yrs   . CORONARY ARTERY BYPASS GRAFT  08/30/2003   x3. LIMA-distal LAD, RIMA-circumflex, SVG-distal RCA  . CYSTOSCOPY W/ STONE MANIPULATION    . ESOPHAGOGASTRODUODENOSCOPY N/A 08/17/2013   Procedure: ESOPHAGOGASTRODUODENOSCOPY (EGD);  Surgeon: [name], MD;  Location: [facility] ENDOSCOPY;  Service: Endoscopy;  Laterality: N/A;  . FEMORAL ARTERY STENT Left 05/23/2014  . FEMORAL-FEMORAL BYPASS GRAFT   2003  . FEMORAL-POPLITEAL BYPASS GRAFT Left 08/09/2016   Procedure: BYPASS GRAFT LEFT FEMORAL-POPLITEAL ARTERY USING GORE 6MM X 80CM PROPATEN GRAFT;  Surgeon: Serafina Mitchell, MD;  Location: [place];  Service: Vascular;  Laterality: Left;  . LOWER EXTREMITY ANGIOGRAM N/A 03/17/2014   Procedure: LOWER EXTREMITY ANGIOGRAM;  Surgeon: [name], MD;  Location: [facility] CATH LAB;  Service: Cardiovascular;  Laterality: N/A;  . PERIPHERAL VASCULAR CATHETERIZATION N/A 06/06/2016   Procedure: Lower Extremity Angiography;  Surgeon: [name], MD;  Location: [place] CV LAB;  Service: Cardiovascular;  Laterality: N/A;  . PERIPHERAL VASCULAR CATHETERIZATION N/A 06/06/2016   Procedure: Abdominal Aortogram;  Surgeon: [name], MD;  Location: [place] CV LAB;  Service: Cardiovascular;  Laterality: N/A;  . RENAL DUPLEX Bilateral 02/02/2013   Bilateral renal arteries demonstrate narrowingconsistent with a 60-99% diameter reduction.  . TRANSTHORACIC ECHOCARDIOGRAM  08/02/2013   EF 55-60%, normal echocardiogram    Family History  Problem Relation Age of Onset  . Heart disease Father   . Heart disease Sister   . Coronary artery disease Brother     CABG x 6  . Heart disease Brother   . Coronary artery disease Brother     first MI 63, died at 47 during cardiac surgery  . Coronary artery disease Sister     CABG x 5, RAS  . Heart disease Sister   . Colon cancer Neg Hx     No Known Allergies  Current Outpatient Prescriptions on File Prior to Visit  Medication Sig Dispense Refill  . acetaminophen (TYLENOL) 500 MG tablet Take 1,000 mg by mouth daily as needed (for leg pain).     Marland Kitchen aspirin EC 81 MG tablet Take 81 mg by mouth every evening.     . clopidogrel (PLAVIX) 75 MG tablet Take 1 tablet (75 mg total) by mouth daily. 30 tablet 11  . cyanocobalamin (,VITAMIN B-12,) 1000 MCG/ML injection Inject 1,000 mcg into the muscle every 30 (thirty) days.     Marland Kitchen docusate sodium (COLACE) 100 MG capsule  Take 100 mg by mouth daily as needed (for constipation).     . ferrous gluconate (FERGON) 324 MG tablet TAKE 1 TABLET BY MOUTH EVERY DAY WITH BREAKFAST 90 tablet 0  . losartan-hydrochlorothiazide (HYZAAR) 100-25 MG tablet Take 1 tablet by mouth every evening.    Marland Kitchen losartan-hydrochlorothiazide (HYZAAR) 100-25 MG tablet TAKE 1 TABLET BY MOUTH DAILY. 30 tablet 11  . metoprolol (LOPRESSOR) 50 MG tablet Take 1 tablet (50 mg total) by mouth daily. 90 tablet 1  . mupirocin ointment (BACTROBAN) 2 %     . neomycin-colistin-hydrocortisone-thonzonium (CORTISPORIN TC) 3.01-04-09-0.5 MG/ML otic suspension Place 4 drops into the left ear 4 (four)  times daily as needed (for itching). 10 mL 0  . NIFEdipine (PROCARDIA XL/ADALAT-CC) 90 MG 24 hr tablet TAKE 1 TABLET BY MOUTH EVERY DAY 90 tablet 3  . ranitidine (ZANTAC) 150 MG tablet Take 150 mg by mouth daily as needed for heartburn.    . simvastatin (ZOCOR) 20 MG tablet TAKE 1 TABLET BY MOUTH IN THE EVENING AFTER SUPPER 90 tablet 3  . isosorbide mononitrate (IMDUR) 30 MG 24 hr tablet Take 1 tablet (30 mg total) by mouth daily. 30 tablet 11   No current facility-administered medications on file prior to visit.     BP (!) 160/58 (BP Location: Right Arm, Patient Position: Sitting, Cuff Size: Normal)   Pulse (!) 56   Temp 97.7 F (36.5 C) (Oral)   Wt 135 lb 4.8 oz (61.4 kg)   SpO2 96%   BMI 21.84 kg/m       Objective:   Physical Exam  Constitutional: He appears well-developed and well-nourished. No distress.  HENT:  Right Ear: Hearing normal. There is swelling.  Left Ear: Hearing normal. There is swelling.  Nose: Right sinus exhibits frontal sinus tenderness. Right sinus exhibits no maxillary sinus tenderness. Left sinus exhibits frontal sinus tenderness. Left sinus exhibits no maxillary sinus tenderness.  Mouth/Throat: Uvula is midline. Tonsillar abscesses: left tonsil   He appears to have what is the beginning of a tonsil stone on left tonsil. Does not  appear as abscess. Denies any difficulty swallowing or throat pain. Uvula is midline   Cardiovascular: Normal rate, regular rhythm, normal heart sounds and intact distal pulses.  Exam reveals no friction rub.   No murmur heard. Pulmonary/Chest: No respiratory distress. He has wheezes (trace wheezes throughout ). He has no rales. He exhibits no tenderness.  Skin: Skin is warm and dry. No rash noted. He is not diaphoretic. No erythema. No pallor.  Psychiatric: He has a normal mood and affect. His behavior is normal. Judgment and thought content normal.  Nursing note and vitals reviewed.     Assessment & Plan:  1. Acute recurrent frontal sinusitis - amoxicillin-clavulanate (AUGMENTIN) 875-125 MG tablet; Take 1 tablet by mouth 2 (two) times daily.  Dispense: 20 tablet; Refill: 0 - Stay hydrated and rest.  - Can use Flonase   2. Tonsil asymmetry - Presents as possible beginning of tonsil stone. Doubt abscess.  - POC Rapid Strep A- Negative  - Culture, Group A Strep - We talked about referral to ENT. He declined at this time. He has no symptoms and would like to monitor. He will follow up as needed 3. Acute bronchitis, unspecified organism - methylPREDNISolone (MEDROL DOSEPAK) 4 MG TBPK tablet; Take as directed  Dispense: 21 tablet; Refill: 0  Dorothyann Peng, NP\

## 2016-10-31 ENCOUNTER — Ambulatory Visit: Payer: Medicare Other

## 2016-11-01 LAB — CULTURE, GROUP A STREP: Organism ID, Bacteria: NORMAL

## 2016-11-11 ENCOUNTER — Other Ambulatory Visit: Payer: Self-pay | Admitting: Dermatology

## 2016-11-11 DIAGNOSIS — L570 Actinic keratosis: Secondary | ICD-10-CM | POA: Diagnosis not present

## 2016-11-11 DIAGNOSIS — D044 Carcinoma in situ of skin of scalp and neck: Secondary | ICD-10-CM | POA: Diagnosis not present

## 2016-11-11 DIAGNOSIS — D0439 Carcinoma in situ of skin of other parts of face: Secondary | ICD-10-CM | POA: Diagnosis not present

## 2016-11-29 ENCOUNTER — Ambulatory Visit: Payer: Medicare Other

## 2016-12-02 ENCOUNTER — Ambulatory Visit (INDEPENDENT_AMBULATORY_CARE_PROVIDER_SITE_OTHER): Payer: Medicare Other | Admitting: Family Medicine

## 2016-12-02 ENCOUNTER — Encounter: Payer: Self-pay | Admitting: Surgery

## 2016-12-02 DIAGNOSIS — E538 Deficiency of other specified B group vitamins: Secondary | ICD-10-CM | POA: Diagnosis not present

## 2016-12-02 MED ORDER — CYANOCOBALAMIN 1000 MCG/ML IJ SOLN
1000.0000 ug | Freq: Once | INTRAMUSCULAR | Status: AC
  Administered 2016-12-02: 1000 ug via INTRAMUSCULAR

## 2016-12-09 ENCOUNTER — Encounter: Payer: Self-pay | Admitting: Surgery

## 2016-12-09 ENCOUNTER — Ambulatory Visit (INDEPENDENT_AMBULATORY_CARE_PROVIDER_SITE_OTHER): Payer: Medicare Other | Admitting: Surgery

## 2016-12-09 VITALS — BP 139/60 | HR 52 | Temp 97.9°F | Resp 20 | Ht 66.0 in | Wt 137.0 lb

## 2016-12-09 DIAGNOSIS — I70212 Atherosclerosis of native arteries of extremities with intermittent claudication, left leg: Secondary | ICD-10-CM

## 2016-12-09 NOTE — Progress Notes (Signed)
[facility]  Patient name: Earl Castillo MRN: [id number] DOB: 1940-11-05 Sex: male   REASON FOR VISIT:    Follow up  HISOTRY OF PRESENT ILLNESS:   Earl Castillo is a 76 y.o. male who returns today for follow-up.  On 08/09/2016 he underwent a left femoral to above-knee popliteal artery bypass graft with 6 mm external ring propatent PTFE.  The patient has a history of a left-to-right femoral-femoral bypass graft by [name] in 2000.  This was done for an occluded right common iliac artery.  He has undergone multiple percutaneous interventions by [name] in the past which ultimately failed.  He was having claudication at approximately 50 yards.  At his first postoperative visit, his biggest complaint was leg swelling and some numbness which were getting better.  Today, the swelling has gone and he only has a little bit of numbness on the anterior side of the left leg.  He has no complaints of pain in his leg anymore.  PAST MEDICAL HISTORY:   Past Medical History:  Diagnosis Date  . Anemia   . Angiodysplasia of intestine - small bowel  09/21/2013  . Blood transfusion abn reaction or complication, no procedure mishap    "HgB dropped real low"  . Cancer [facility])    Skin cancers  . Carotid artery disease ([place])    status post carotid endarterectomy by [name]  . Chicken pox   . Colon polyps 08/2012   Hyperplastic and adenomatous sessile polyps  . Coronary artery disease    CABG'04, Low risk Myoview 2013  . GERD (gastroesophageal reflux disease)   . Hemorrhoid   . History of blood transfusion 08/2013   "HgB dropped real low; related to bleeding from my rectum"  . History of kidney stones    1 removed surgical, passed 2  . Hyperlipidemia   . Hypertension   . 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 Lower GI bleeding 08/2013  . Osteoarthritis    "hips" (05/23/2014)  . Peripheral arterial disease ([place])    status post left to right fem-fem crossover graft in by [name]  . Pneumonia    - "years ago"  . Sinus headache    "alot" (05/23/2014)  . Skin exam, screening for cancer    sees [name]   . Vitamin B12 deficiency 11/23/2013     FAMILY HISTORY:   Family History  Problem Relation Age of Onset  . Heart disease Father   . Heart disease Sister   . Coronary artery disease Brother     CABG x 6  . Heart disease Brother   . Coronary artery disease Brother     first MI 52, died at 60 during cardiac surgery  . Coronary artery disease Sister     CABG x 5, RAS  . Heart disease Sister   . Colon cancer Neg Hx     SOCIAL HISTORY:   Social History  Substance Use Topics  . Smoking status: Current Some Day Smoker    Packs/day: 0.25    Years: 58.00    Types: Cigarettes  . Smokeless tobacco: Never Used     Comment: 3 cigarettes every couple of days  . Alcohol use No     ALLERGIES:   No Known Allergies   CURRENT MEDICATIONS:   Current Outpatient Prescriptions  Medication Sig Dispense Refill  . acetaminophen (TYLENOL) 500 MG tablet Take  1,000 mg by mouth daily as needed (for leg pain).     Marland Kitchen amoxicillin-clavulanate (AUGMENTIN) 875-125 MG tablet Take 1 tablet by mouth 2 (two) times daily. 20 tablet 0  . aspirin EC 81 MG tablet Take 81 mg by mouth every evening.     . clopidogrel (PLAVIX) 75 MG tablet Take 1 tablet (75 mg total) by mouth daily. 30 tablet 11  . cyanocobalamin (,VITAMIN B-12,) 1000 MCG/ML injection Inject 1,000 mcg into the muscle every 30 (thirty) days.     Marland Kitchen docusate sodium (COLACE) 100 MG capsule Take 100 mg by mouth daily as needed (for constipation).     . ferrous gluconate (FERGON) 324 MG tablet TAKE 1 TABLET BY MOUTH EVERY DAY WITH BREAKFAST 90 tablet 0  . losartan-hydrochlorothiazide (HYZAAR) 100-25 MG tablet Take 1 tablet by mouth every evening.    . metoprolol (LOPRESSOR) 50 MG tablet  Take 1 tablet (50 mg total) by mouth daily. 90 tablet 1  . neomycin-colistin-hydrocortisone-thonzonium (CORTISPORIN TC) 3.01-04-09-0.5 MG/ML otic suspension Place 4 drops into the left ear 4 (four) times daily as needed (for itching). 10 mL 0  . NIFEdipine (PROCARDIA XL/ADALAT-CC) 90 MG 24 hr tablet TAKE 1 TABLET BY MOUTH EVERY DAY 90 tablet 3  . ranitidine (ZANTAC) 150 MG tablet Take 150 mg by mouth daily as needed for heartburn.    . simvastatin (ZOCOR) 20 MG tablet TAKE 1 TABLET BY MOUTH IN THE EVENING AFTER SUPPER 90 tablet 3  . isosorbide mononitrate (IMDUR) 30 MG 24 hr tablet Take 1 tablet (30 mg total) by mouth daily. 30 tablet 11  . losartan-hydrochlorothiazide (HYZAAR) 100-25 MG tablet TAKE 1 TABLET BY MOUTH DAILY. (Patient not taking: Reported on 12/09/2016) 30 tablet 11  . methylPREDNISolone (MEDROL DOSEPAK) 4 MG TBPK tablet Take as directed (Patient not taking: Reported on 12/09/2016) 21 tablet 0  . mupirocin ointment (BACTROBAN) 2 %      No current facility-administered medications for this visit.     REVIEW OF SYSTEMS:   [X]  denotes positive finding, [ ]  denotes negative finding Cardiac  Comments:  Chest pain or chest pressure:    Shortness of breath upon exertion:    Short of breath when lying flat:    Irregular heart rhythm:        Vascular    Pain in calf, thigh, or hip brought on by ambulation:    Pain in feet at night that wakes you up from your sleep:     Blood clot in your veins:    Leg swelling:         Pulmonary    Oxygen at home:    Productive cough:     Wheezing:         Neurologic    Sudden weakness in arms or legs:     Sudden numbness in arms or legs:     Sudden onset of difficulty speaking or slurred speech:    Temporary loss of vision in one eye:     Problems with dizziness:         Gastrointestinal    Blood in stool:     Vomited blood:         Genitourinary    Burning when urinating:     Blood in urine:        Psychiatric    Major depression:          Hematologic    Bleeding problems:    Problems with blood clotting too easily:  Skin    Rashes or ulcers:        Constitutional    Fever or chills:      PHYSICAL EXAM:   Vitals:   12/09/16 1118  BP: 139/60  Pulse: (!) 52  Resp: 20  Temp: 97.9 F (36.6 C)  TempSrc: Oral  SpO2: 96%  Weight: 137 lb (62.1 kg)  Height: 5\' 6"  (1.676 m)    GENERAL: The patient is a well-nourished male, in no acute distress. The vital signs are documented above. CARDIAC: There is a regular rate and rhythm.  VASCULAR: Palpable left dorsalis pedis pulse.  All bypass incisions have healed PULMONARY: Non-labored respirations ABDOMEN: Soft and non-tender with normal pitched bowel sounds.  MUSCULOSKELETAL: There are no major deformities or cyanosis. NEUROLOGIC: Numbness on the anterior side of the left leg beginning at the patella and going on the anterior lateral side of the lower leg SKIN: There are no ulcers or rashes noted. PSYCHIATRIC: The patient has a normal affect.  STUDIES:   I have reviewed his ABIs done at Dr. Kennon Holter office.  ABI was 0.95 on the left  MEDICAL ISSUES:   Status post left femoral-popliteal bypass graft.  Patient has had an excellent result.  His symptoms have resolved.  I told him that he may have persistent numbness on the anterior lateral side of the left leg.  He is going to get surveillance imaging with [name].  I'll see him back on an as-needed basis.    Annamarie Major, MD [facility] [phone] Pager [phone]

## 2016-12-10 ENCOUNTER — Other Ambulatory Visit: Payer: Self-pay | Admitting: Family Medicine

## 2016-12-13 ENCOUNTER — Ambulatory Visit (INDEPENDENT_AMBULATORY_CARE_PROVIDER_SITE_OTHER): Payer: Medicare Other

## 2016-12-13 VITALS — BP 162/60 | HR 55 | Ht 66.0 in | Wt 137.0 lb

## 2016-12-13 DIAGNOSIS — Z0000 Encounter for general adult medical examination without abnormal findings: Secondary | ICD-10-CM

## 2016-12-13 NOTE — Progress Notes (Addendum)
Subjective:   Earl Castillo is a 76 y.o. male who presents for Medicare Annual/Subsequent preventive examination.  Medicare Annual Preventive Care Visit - Subsequent Last [place] as poor, fair, good or great? 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 Marital status: Married    Spouse name: N/A  . Number of children: 2  . Years of education: N/A   Occupational History  .  Semi Retired Geophysicist/field seismologist    Social History Main Topics  . Smoking status: Current Some Day Smoker    Packs/day: 0.25    Years: 58.00    Types: Cigarettes  . Smokeless tobacco: Never Used     Comment: rarely smokes now; does have approx 38 pack hx  of smoking/ does now the LDCT   . Alcohol use No  . Drug use: No  . Sexual activity: Not Currently   Other Topics Concern  . Not on file   Social History Narrative   Daily caffeine     Family History  Problem Relation Age of Onset  . Heart disease Father   . Heart disease Sister   . Coronary artery disease Brother     CABG x 6  . Heart disease Brother   . Coronary artery disease Brother     first MI 72, died at 79 during cardiac surgery  . Coronary artery disease Sister    CABG x 5, RAS  . Heart disease Sister   . Colon cancer Neg Hx     Medical issues that coincide with lifestyle reviewed  Smoking is very limited  Discussed LDCT and declined    3.) Review of functional ability and level of safety: See Medicare screens for hearing; vision; falls; IADLs and ADLs, Advanced Directives  Safety information given for community, home and personal safety;    General: alert, appear well hydrated and in no acute distress   Mood stable; attentive;   See patient instructions for recommendations.  Earl Castillo and Lorre Nick the dog Castillo is helpful to them   4)The following written screening schedule of preventive measures were reviewed with assessment and plan made per below, orders and patient instructions:    Alcohol screening done  Obesity Screening and counseling done  STI screening (Hep C if born [birth year]) offered and per pt wishes  Tobacco Screening done / current same day smoker; 2 to 3 per day for 58 years. 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 will notify when time  HEARING and VISION Hearing Screening Comments: Not noted in left ear- congenital issues per Dr. Sarajane Jews  Right ear 2000hz  Vision Screening Comments: Last eye exam scheduled for may 2018 Dr. [name]  Did request information  regarding free hearing aid if needed    Cardiovascular screening blood tests (lipids q5y) Family hx; followed by cardiology Fem-pop bypass, BYpass surgery  Lipids are well controlled  DM neg Smokes but limited; not every day  No issues at present Will start walking again this spring   5) Summary: -risk factors and conditions per above assessment were discussed and treatment, recommendations and referrals were offered per documentation above and orders and patient instructions.  Education and counseling regarding the above review of health provided with a plan for the following: -fall prevention strategies discussed  -personal and community safety -healthy lifestyle discussed (weight loss, exercise, etc_  -importance and resources for completing advanced directives discussed -see patient instructions below for any other recommendations provided   Cardiac Risk Factors include: advanced age (>79men, >62 women);dyslipidemia;hypertension;male gender;family history of premature cardiovascular disease;sedentary lifestyle;smoking/ tobacco exposure Patient Care Team: [name], MD as PCP - General [name], MD as Consulting Physician (Cardiology)      Objective:    Vitals: BP (!) 162/60   Pulse (!) 55   Ht 5\' 6"  (1.676 m)   Wt 137 lb (62.1 kg)   SpO2 96%   BMI 22.11 kg/m   Body mass index is 22.11 kg/m. Declined recheck of BP   Tobacco History  Smoking Status  . Current Some Day Smoker  . Packs/day: 0.25  . Years: 58.00  . Types: Cigarettes  Smokeless Tobacco  . Never Used    Comment: rarely smokes now; does have approx 38 pack hx  of smoking/ does now the LDCT      Ready to quit: Not Answered Counseling given: Not Answered   Past Medical History:  Diagnosis Date  . Anemia   . Angiodysplasia of intestine - small bowel  09/21/2013  . Blood transfusion abn reaction or complication, no procedure mishap    "HgB dropped real low"  . Cancer [facility])    Skin  cancers  . Carotid artery disease ([place])    status post carotid endarterectomy by [name]  . Chicken pox   . Colon polyps 08/2012   Hyperplastic and adenomatous sessile polyps  . Coronary artery disease    CABG'04, Low risk Myoview 2013  . GERD (gastroesophageal reflux disease)   . Hemorrhoid   . History of blood transfusion 08/2013   "HgB dropped real low; related to bleeding from my rectum"  . History of kidney stones    1 removed surgical, passed 2  . Hyperlipidemia   . Hypertension   . 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 Lower GI bleeding  08/2013  . Osteoarthritis    "hips" (05/23/2014)  . Peripheral arterial disease ([place])    status post left to right fem-fem crossover graft in by [name]  . Pneumonia    - "years ago"  . Sinus headache    "alot" (05/23/2014)  . Skin exam, screening for cancer    sees [name]   . Vitamin B12 deficiency 11/23/2013   Past Surgical History:  Procedure Laterality Date  . BAND HEMORRHOIDECTOMY    . BASAL CELL CARCINOMA EXCISION  May 2009   per [name] , left nose   . basket extraction of ureteral stone  1970's  . Bypass rt leg Right 2000  . capsule endoscopy  10-05-13   per [name], 4 AVM's  . CARDIAC CATHETERIZATION  08/18/2003   Recommend CABG  . CARDIOVASCULAR STRESS TEST - LEXISCAN  08/25/2012   No significant wall motion abnormalities  . CAROTID ANGIOGRAM N/A 03/17/2014   Procedure: CAROTID ANGIOGRAM;  Surgeon: [name], MD;  Location[facility] C[facility] LAB;  Service: Cardiovascular;  Laterality: N/A;  right carotid  . CAROTID ENDARTERECTOMY Right   . CAROTID ENDARTERECTOMY Right 03/26/2005   Dr Georgina Quint  . CATARACT EXTRACTION W/ INTRAOCULAR LENS  IMPLANT, BILATERAL Bilateral 2014   per [name]  . [place]   "bulging disc"  . COLONOSCOPY  08-12-12   per [name], tubular adenomas, repeat in 5 yrs   . CORONARY  ARTERY BYPASS GRAFT  08/30/2003   x3. LIMA-distal LAD, RIMA-circumflex, SVG-distal RCA  . CYSTOSCOPY W/ STONE MANIPULATION    . ESOPHAGOGASTRODUODENOSCOPY N/A 08/17/2013   Procedure: ESOPHAGOGASTRODUODENOSCOPY (EGD);  Surgeon: [name], MD;  Location: [facility] ENDOSCOPY;  Service: Endoscopy;  Laterality: N/A;  . FEMORAL ARTERY STENT Left 05/23/2014  . FEMORAL-FEMORAL BYPASS GRAFT  2003  . FEMORAL-POPLITEAL BYPASS GRAFT Left 08/09/2016   Procedure: BYPASS GRAFT LEFT FEMORAL-POPLITEAL ARTERY USING GORE 6MM X 80CM PROPATEN GRAFT;  Surgeon: Serafina Mitchell, MD;  Location: [place];  Service: Vascular;  Laterality: Left;  . LOWER EXTREMITY ANGIOGRAM N/A 03/17/2014   Procedure: LOWER EXTREMITY ANGIOGRAM;  Surgeon: [name], MD;  Location: [facility] CATH LAB;  Service: Cardiovascular;  Laterality: N/A;  . PERIPHERAL VASCULAR CATHETERIZATION N/A 06/06/2016   Procedure: Lower Extremity Angiography;  Surgeon: [name], MD;  Location: [place] CV LAB;  Service: Cardiovascular;  Laterality: N/A;  . PERIPHERAL VASCULAR CATHETERIZATION N/A 06/06/2016   Procedure: Abdominal Aortogram;  Surgeon: [name], MD;  Location: [place] CV LAB;  Service: Cardiovascular;  Laterality: N/A;  . RENAL DUPLEX Bilateral 02/02/2013   Bilateral renal arteries demonstrate narrowingconsistent with a 60-99% diameter reduction.  . TRANSTHORACIC ECHOCARDIOGRAM  08/02/2013   EF 55-60%, normal echocardiogram   Family History  Problem Relation Age of Onset  . Heart disease Father   . Heart disease Sister   . Coronary artery disease Brother     CABG x 6  . Heart disease Brother   . Coronary artery disease Brother     first MI 66, died at 66 during cardiac surgery  . Coronary artery disease Sister     CABG x 5, RAS  . Heart disease Sister   . Colon cancer Neg Hx    History  Sexual Activity  . Sexual activity: Not Currently    Outpatient Encounter Prescriptions as of 12/13/2016  Medication Sig  . acetaminophen  (TYLENOL) 500 MG tablet Take 1,000 mg by mouth  daily as needed (for leg pain).   Marland Kitchen amoxicillin-clavulanate (AUGMENTIN) 875-125 MG tablet Take 1 tablet by mouth 2 (two) times daily.  Marland Kitchen aspirin EC 81 MG tablet Take 81 mg by mouth every evening.   . clopidogrel (PLAVIX) 75 MG tablet Take 1 tablet (75 mg total) by mouth daily.  . cyanocobalamin (,VITAMIN B-12,) 1000 MCG/ML injection Inject 1,000 mcg into the muscle every 30 (thirty) days.   Marland Kitchen docusate sodium (COLACE) 100 MG capsule Take 100 mg by mouth daily as needed (for constipation).   . ferrous gluconate (FERGON) 324 MG tablet TAKE 1 TABLET BY MOUTH EVERY DAY WITH BREAKFAST  . losartan-hydrochlorothiazide (HYZAAR) 100-25 MG tablet Take 1 tablet by mouth every evening.  . methylPREDNISolone (MEDROL DOSEPAK) 4 MG TBPK tablet Take as directed  . metoprolol (LOPRESSOR) 50 MG tablet Take 1 tablet (50 mg total) by mouth daily.  . mupirocin ointment (BACTROBAN) 2 %   . neomycin-colistin-hydrocortisone-thonzonium (CORTISPORIN TC) 3.01-04-09-0.5 MG/ML otic suspension Place 4 drops into the left ear 4 (four) times daily as needed (for itching).  Marland Kitchen NIFEdipine (PROCARDIA XL/ADALAT-CC) 90 MG 24 hr tablet TAKE 1 TABLET BY MOUTH EVERY DAY  . ranitidine (ZANTAC) 150 MG tablet Take 150 mg by mouth daily as needed for heartburn.  . simvastatin (ZOCOR) 20 MG tablet TAKE 1 TABLET BY MOUTH IN THE EVENING AFTER SUPPER  . [DISCONTINUED] losartan-hydrochlorothiazide (HYZAAR) 100-25 MG tablet TAKE 1 TABLET BY MOUTH DAILY.  . [DISCONTINUED] losartan-hydrochlorothiazide (HYZAAR) 100-25 MG tablet TAKE 1 TABLET BY MOUTH DAILY.  . isosorbide mononitrate (IMDUR) 30 MG 24 hr tablet Take 1 tablet (30 mg total) by mouth daily.   No facility-administered encounter medications on file as of 12/13/2016.     Activities of Daily Living In your present state of health, do you have any difficulty performing the following activities: 12/13/2016 08/10/2016  Hearing? (No Data) N  Vision? Y  N  Difficulty concentrating or making decisions? N N  Walking or climbing stairs? N Y  Dressing or bathing? N N  Doing errands, shopping? N N  Preparing Food and eating ? N -  Using the Toilet? N -  In the past six months, have you accidently leaked urine? N -  Do you have problems with loss of bowel control? N -  Managing your Medications? N -  Managing your Finances? N -  Housekeeping or managing your Housekeeping? N -  Some recent data might be hidden    Patient Care Team: [name], MD as PCP - General [name], MD as Consulting Physician (Cardiology)   Assessment:     Exercise Activities and Dietary recommendations Current Exercise Habits: Home exercise routine, Type of exercise: walking, Time (Minutes): 45, Frequency (Times/Week): 3, Weekly Exercise (Minutes/Week): 135, Intensity: Mild  Goals    . Exercise 150 minutes per week (moderate activity)          States he will get more exercise Will walk more in the summer; [place] has a long driveway Is up and down hills  Will do this every day he does not work       Fall Risk Fall Risk  12/13/2016 04/29/2014  Falls in the past year? No No   Depression Screen PHQ 2/9 Scores 12/13/2016 04/29/2014  PHQ - 2 Score 0 0    Cognitive Function MMSE - Mini Mental State Exam 12/13/2016  Not completed: (No Data)       Ad8 score reviewed for issues:  Issues making decisions:  Less interest in hobbies / activities:  Repeats questions, stories (family complaining):  Trouble using ordinary gadgets (microwave, computer, phone):  Forgets the month or year:   Mismanaging finances:   Remembering appts:  Daily problems with thinking and/or memory: Ad8 score is=0    Immunization History  Administered Date(s) Administered  . Influenza, High Dose Seasonal PF 06/28/2016  . Influenza,inj,Quad PF,36+ Mos 08/18/2013, 07/01/2014, 06/30/2015  . Pneumococcal Conjugate-13 08/04/2015  . Pneumococcal Polysaccharide-23  08/18/2013   Screening Tests Health Maintenance  Topic Date Due  . TETANUS/TDAP  07/05/1960  . ZOSTAVAX  07/05/2001  . COLONOSCOPY  08/12/2017  . INFLUENZA VACCINE  Completed  . PNA vac Low Risk Adult  Completed      Plan:      PCP Notes  Health Maintenance Tdap due; noted and declined today but will check on pharmacy  Educated on shingles 2018 vaccine; will check with insurer on coverage   Abnormal Screens hearing but notes long term issues  Referrals none  Patient concerns;none  Nurse Concerns; BP but states it was down this year   Next PCP apt to make this spring    During the course of the visit the patient was educated and counseled about the following appropriate screening and preventive services:   Vaccines to include Pneumoccal, Influenza, Hepatitis B, Td, Zostavax, HCV  Electrocardiogram  Cardiovascular Disease  Colorectal cancer screening due 09/2017  Diabetes screening neg  Prostate Cancer Screening  Glaucoma screening  Nutrition counseling   Smoking cessation counseling  Patient Instructions (the written plan) was given to the patient.    [id number], RN  12/13/2016  I have reviewed this note and agree with its contents.  Alysia Penna, MD

## 2016-12-13 NOTE — Patient Instructions (Addendum)
Earl Castillo , Thank you for taking time to come for your Medicare Wellness Visit. I appreciate your ongoing commitment to your health goals. Please review the following plan we discussed and let me know if I can assist you in the future.   A Tetanus is recommended every 10 years. Medicare covers a tetanus if you have a cut or wound; otherwise, there may be a charge. If you had not had a tetanus with pertusses, known as the Tdap, you can take this anytime.   Educated to check with insurance regarding coverage of Shingles vaccination on Part D or Part B and may have lower co-pay if provided on the Part D side 2018 new shingles coming out; may consider taking this  Will try to complete AD; Given copy  Referred to [facility] for questions [place] offers free advance directive forms, as well as assistance in completing the forms themselves. For assistance, contact the Spiritual Care Department at [phone], or the Clinical Social Work Department at [phone].   [place] [facility]; [phone] [place]; [phone] Get resource to get information on any and all community programs for Seniors  [place]: 651-035-9261 Community Health Response Program -[facility]; Need to be a skilled visit but can assist with bathing as well; [phone]   Deaf & Hard of Hearing Division Services - can assist with hearing aid x 1  No reviews  [address] Office  [place] [address]  [phone] Hearing screens do not have to be referred by Medical doctor     Prevention of falls: Remove rugs or any tripping hazards in the home Use Non slip mats in bathtubs and showers Placing grab bars next to the toilet and or shower Placing handrails on both sides of the stair way Adding extra lighting in the home.   Personal safety issues reviewed:  1. Consider starting a community watch program per [facility] 2.  Changes batteries is smoke detector and/or carbon  monoxide detector  3.  If you have firearms; keep them in a safe place 4.  Wear protection when in the sun; Always wear sunscreen or a hat; It is good to have your doctor check your skin annually or review any new areas of concern 5. Driving safety; Keep in the right lane; stay 3 car lengths behind the car in front of you on the highway; look 3 times prior to pulling out; carry your cell phone everywhere you go!    Learn about the Yellow Dot program:  The program allows first responders at your emergency to have access to who your physician is, as well as your medications and medical conditions.  Citizens requesting the Yellow Dot Packages should contact Master Corporal Nunzio Cobbs at the [facility] [phone] for the first week of the program and beginning the week after Easter citizens should contact their Scientist, physiological.   These are the goals we discussed: Goals    . Exercise 150 minutes per week (moderate activity)          States he will get more exercise Will walk more in the summer; [place] has a long driveway Is up and down hills  Will do this every day he does not work        This is a list of the screening recommended for you and due dates:  Health Maintenance  Topic Date Due  . Tetanus Vaccine  07/06/1975  . Shingles Vaccine  07/06/2075  .  Colon Cancer Screening  08/12/1997  . Flu Shot  Completed  . Pneumonia vaccines  Completed        Fall Prevention in the Home Introduction Falls can cause injuries. They can happen to people of all ages. There are many things you can do to make your home safe and to help prevent falls. What can I do on the outside of my home?  Regularly fix the edges of walkways and driveways and fix any cracks.  Remove anything that might make you trip as you walk through a door, such as a raised step or threshold.  Trim any bushes or trees on the path to your home.  Use bright outdoor  lighting.  Clear any walking paths of anything that might make someone trip, such as rocks or tools.  Regularly check to see if handrails are loose or broken. Make sure that both sides of any steps have handrails.  Any raised decks and porches should have guardrails on the edges.  Have any leaves, snow, or ice cleared regularly.  Use sand or salt on walking paths during winter.  Clean up any spills in your garage right away. This includes oil or grease spills. What can I do in the bathroom?  Use night lights.  Install grab bars by the toilet and in the tub and shower. Do not use towel bars as grab bars.  Use non-skid mats or decals in the tub or shower.  If you need to sit down in the shower, use a plastic, non-slip stool.  Keep the floor dry. Clean up any water that spills on the floor as soon as it happens.  Remove soap buildup in the tub or shower regularly.  Attach bath mats securely with double-sided non-slip rug tape.  Do not have throw rugs and other things on the floor that can make you trip. What can I do in the bedroom?  Use night lights.  Make sure that you have a light by your bed that is easy to reach.  Do not use any sheets or blankets that are too big for your bed. They should not hang down onto the floor.  Have a firm chair that has side arms. You can use this for support while you get dressed.  Do not have throw rugs and other things on the floor that can make you trip. What can I do in the kitchen?  Clean up any spills right away.  Avoid walking on wet floors.  Keep items that you use a lot in easy-to-reach places.  If you need to reach something above you, use a strong step stool that has a grab bar.  Keep electrical cords out of the way.  Do not use floor polish or wax that makes floors slippery. If you must use wax, use non-skid floor wax.  Do not have throw rugs and other things on the floor that can make you trip. What can I do with my  stairs?  Do not leave any items on the stairs.  Make sure that there are handrails on both sides of the stairs and use them. Fix handrails that are broken or loose. Make sure that handrails are as long as the stairways.  Check any carpeting to make sure that it is firmly attached to the stairs. Fix any carpet that is loose or worn.  Avoid having throw rugs at the top or bottom of the stairs. If you do have throw rugs, attach them to the floor with  carpet tape.  Make sure that you have a light switch at the top of the stairs and the bottom of the stairs. If you do not have them, ask someone to add them for you. What else can I do to help prevent falls?  Wear shoes that:  Do not have high heels.  Have rubber bottoms.  Are comfortable and fit you well.  Are closed at the toe. Do not wear sandals.  If you use a stepladder:  Make sure that it is fully opened. Do not climb a closed stepladder.  Make sure that both sides of the stepladder are locked into place.  Ask someone to hold it for you, if possible.  Clearly mark and make sure that you can see:  Any grab bars or handrails.  First and last steps.  Where the edge of each step is.  Use tools that help you move around (mobility aids) if they are needed. These include:  Canes.  Walkers.  Scooters.  Crutches.  Turn on the lights when you go into a dark area. Replace any light bulbs as soon as they burn out.  Set up your furniture so you have a clear path. Avoid moving your furniture around.  If any of your floors are uneven, fix them.  If there are any pets around you, be aware of where they are.  Review your medicines with your doctor. Some medicines can make you feel dizzy. This can increase your chance of falling. Ask your doctor what other things that you can do to help prevent falls. This information is not intended to replace advice given to you by your health care provider. Make sure you discuss any  questions you have with your health care provider. Document Released: 08/17/2009 Document Revised: 03/28/2016 Document Reviewed: 11/25/2014  2017 Elsevier  Health Maintenance, Male A healthy lifestyle and preventative care can promote health and wellness.  Maintain regular health, dental, and eye exams.  Eat a healthy diet. Foods like vegetables, fruits, whole grains, low-fat dairy products, and lean protein foods contain the nutrients you need and are low in calories. Decrease your intake of foods high in solid fats, added sugars, and salt. Get information about a proper diet from your health care provider, if necessary.  Regular physical exercise is one of the most important things you can do for your health. Most adults should get at least 150 minutes of moderate-intensity exercise (any activity that increases your heart rate and causes you to sweat) each week. In addition, most adults need muscle-strengthening exercises on 2 or more days a week.   Maintain a healthy weight. The body mass index (BMI) is a screening tool to identify possible weight problems. It provides an estimate of body fat based on height and weight. Your health care provider can find your BMI and can help you achieve or maintain a healthy weight. For males 20 years and older:  A BMI below 18.5 is considered underweight.  A BMI of 18.5 to 24.9 is normal.  A BMI of 25 to 29.9 is considered overweight.  A BMI of 30 and above is considered obese.  Maintain normal blood lipids and cholesterol by exercising and minimizing your intake of saturated fat. Eat a balanced diet with plenty of fruits and vegetables. Blood tests for lipids and cholesterol should begin at age 85 and be repeated every 5 years. If your lipid or cholesterol levels are high, you are over age 65, or you are at high risk for  heart disease, you may need your cholesterol levels checked more frequently.Ongoing high lipid and cholesterol levels should be  treated with medicines if diet and exercise are not working.  If you smoke, find out from your health care provider how to quit. If you do not use tobacco, do not start.  Lung cancer screening is recommended for adults aged 63-80 years who are at high risk for developing lung cancer because of a history of smoking. A yearly low-dose CT scan of the lungs is recommended for people who have at least a 30-pack-year history of smoking and are current smokers or have quit within the past 15 years. A pack year of smoking is smoking an average of 1 pack of cigarettes a day for 1 year (for example, a 30-pack-year history of smoking could mean smoking 1 pack a day for 30 years or 2 packs a day for 15 years). Yearly screening should continue until the smoker has stopped smoking for at least 15 years. Yearly screening should be stopped for people who develop a health problem that would prevent them from having lung cancer treatment.  If you choose to drink alcohol, do not have more than 2 drinks per day. One drink is considered to be 12 oz (360 mL) of beer, 5 oz (150 mL) of wine, or 1.5 oz (45 mL) of liquor.  Avoid the use of street drugs. Do not share needles with anyone. Ask for help if you need support or instructions about stopping the use of drugs.  High blood pressure causes heart disease and increases the risk of stroke. High blood pressure is more likely to develop in:  People who have blood pressure in the end of the normal range (100-139/85-89 mm Hg).  People who are overweight or obese.  People who are African American.  If you are 48-21 years of age, have your blood pressure checked every 3-5 years. If you are 23 years of age or older, have your blood pressure checked every year. You should have your blood pressure measured twice-once when you are at a hospital or clinic, and once when you are not at a hospital or clinic. Record the average of the two measurements. To check your blood pressure when  you are not at a hospital or clinic, you can use:  An automated blood pressure machine at a pharmacy.  A home blood pressure monitor.  If you are 22-10 years old, ask your health care provider if you should take aspirin to prevent heart disease.  Diabetes screening involves taking a blood sample to check your fasting blood sugar level. This should be done once every 3 years after age 59 if you are at a normal weight and without risk factors for diabetes. Testing should be considered at a younger age or be carried out more frequently if you are overweight and have at least 1 risk factor for diabetes.  Colorectal cancer can be detected and often prevented. Most routine colorectal cancer screening begins at the age of 76 and continues through age 29. However, your health care provider may recommend screening at an earlier age if you have risk factors for colon cancer. On a yearly basis, your health care provider may provide home test kits to check for hidden blood in the stool. A small camera at the end of a tube may be used to directly examine the colon (sigmoidoscopy or colonoscopy) to detect the earliest forms of colorectal cancer. Talk to your health care provider about this at  age 38 when routine screening begins. A direct exam of the colon should be repeated every 5-10 years through age 43, unless early forms of precancerous polyps or small growths are found.  People who are at an increased risk for hepatitis B should be screened for this virus. You are considered at high risk for hepatitis B if:  You were born in a country where hepatitis B occurs often. Talk with your health care provider about which countries are considered high risk.  Your parents were born in a high-risk country and you have not received a shot to protect against hepatitis B (hepatitis B vaccine).  You have HIV or AIDS.  You use needles to inject street drugs.  You live with, or have sex with, someone who has hepatitis  B.  You are a man who has sex with other men (MSM).  You get hemodialysis treatment.  You take certain medicines for conditions like cancer, organ transplantation, and autoimmune conditions.  Hepatitis C blood testing is recommended for all people born from [birth year] through 1965 and any individual with known risk factors for hepatitis C.  Healthy men should no longer receive prostate-specific antigen (PSA) blood tests as part of routine cancer screening. Talk to your health care provider about prostate cancer screening.  Testicular cancer screening is not recommended for adolescents or adult males who have no symptoms. Screening includes self-exam, a health care provider exam, and other screening tests. Consult with your health care provider about any symptoms you have or any concerns you have about testicular cancer.  Practice safe sex. Use condoms and avoid high-risk sexual practices to reduce the spread of sexually transmitted infections (STIs).  You should be screened for STIs, including gonorrhea and chlamydia if:  You are sexually active and are younger than 24 years.  You are older than 24 years, and your health care provider tells you that you are at risk for this type of infection.  Your sexual activity has changed since you were last screened, and you are at an increased risk for chlamydia or gonorrhea. Ask your health care provider if you are at risk.  If you are at risk of being infected with HIV, it is recommended that you take a prescription medicine daily to prevent HIV infection. This is called pre-exposure prophylaxis (PrEP). You are considered at risk if:  You are a man who has sex with other men (MSM).  You are a heterosexual man who is sexually active with multiple partners.  You take drugs by injection.  You are sexually active with a partner who has HIV.  Talk with your health care provider about whether you are at high risk of being infected with HIV. If you  choose to begin PrEP, you should first be tested for HIV. You should then be tested every 3 months for as long as you are taking PrEP.  Use sunscreen. Apply sunscreen liberally and repeatedly throughout the day. You should seek shade when your shadow is shorter than you. Protect yourself by wearing long sleeves, pants, a wide-brimmed hat, and sunglasses year round whenever you are outdoors.  Tell your health care provider of new moles or changes in moles, especially if there is a change in shape or color. Also, tell your health care provider if a mole is larger than the size of a pencil eraser.  A one-time screening for abdominal aortic aneurysm (AAA) and surgical repair of large AAAs by ultrasound is recommended for men aged 64-75 years who  are current or former smokers.  Stay current with your vaccines (immunizations). This information is not intended to replace advice given to you by your health care provider. Make sure you discuss any questions you have with your health care provider. Document Released: 04/18/2008 Document Revised: 11/11/2014 Document Reviewed: 07/25/2015 Elsevier Interactive Patient Education  2017 [employer].   Hearing Loss Introduction Hearing loss is a partial or total loss of the ability to hear. This can be temporary or permanent, and it can happen in one or both ears. Hearing loss may be referred to as deafness. Medical care is necessary to treat hearing loss properly and to prevent the condition from getting worse. Your hearing may partially or completely come back, depending on what caused your hearing loss and how severe it is. In some cases, hearing loss is permanent. What are the causes? Common causes of hearing loss include:  Too much wax in the ear canal.  Infection of the ear canal or middle ear.  Fluid in the middle ear.  Injury to the ear or surrounding area.  An object stuck in the ear.  Prolonged exposure to loud sounds, such as music. Less  common causes of hearing loss include:  Tumors in the ear.  Viral or bacterial infections, such as meningitis.  A hole in the eardrum (perforated eardrum).  Problems with the hearing nerve that sends signals between the brain and the ear.  Certain medicines. What are the signs or symptoms? Symptoms of this condition may include:  Difficulty telling the difference between sounds.  Difficulty following a conversation when there is background noise.  Lack of response to sounds in your environment. This may be most noticeable when you do not respond to startling sounds.  Needing to turn up the volume on the television, radio, etc.  Ringing in the ears.  Dizziness.  Pain in the ears. How is this diagnosed? This condition is diagnosed based on a physical exam and a hearing test (audiometry). The audiometry test will be performed by a hearing specialist (audiologist). You may also be referred to an ear, nose, and throat (ENT) specialist (otolaryngologist). How is this treated? Treatment for recent onset of hearing loss may include:  Ear wax removal.  Being prescribed medicines to prevent infection (antibiotics).  Being prescribed medicines to reduce inflammation (corticosteroids). Follow these instructions at home:  If you were prescribed an antibiotic medicine, take it as told by your health care provider. Do not stop taking the antibiotic even if you start to feel better.  Take over-the-counter and prescription medicines only as told by your health care provider.  Avoid loud noises.  Return to your normal activities as told by your health care provider. Ask your health care provider what activities are safe for you.  Keep all follow-up visits as told by your health care provider. This is important. Contact a health care provider if:  You feel dizzy.  You develop new symptoms.  You vomit or feel nauseous.  You have a fever. Get help right away if:  You develop  sudden changes in your vision.  You have severe ear pain.  You have new or increased weakness.  You have a severe headache. This information is not intended to replace advice given to you by your health care provider. Make sure you discuss any questions you have with your health care provider. Document Released: 10/21/2005 Document Revised: 03/28/2016 Document Reviewed: 03/08/2015  2017 Elsevier  ha

## 2016-12-23 DIAGNOSIS — L570 Actinic keratosis: Secondary | ICD-10-CM | POA: Diagnosis not present

## 2016-12-23 DIAGNOSIS — L905 Scar conditions and fibrosis of skin: Secondary | ICD-10-CM | POA: Diagnosis not present

## 2016-12-23 DIAGNOSIS — Z85828 Personal history of other malignant neoplasm of skin: Secondary | ICD-10-CM | POA: Diagnosis not present

## 2016-12-27 ENCOUNTER — Ambulatory Visit (INDEPENDENT_AMBULATORY_CARE_PROVIDER_SITE_OTHER): Payer: Medicare Other

## 2016-12-27 DIAGNOSIS — E538 Deficiency of other specified B group vitamins: Secondary | ICD-10-CM | POA: Diagnosis not present

## 2016-12-27 MED ORDER — CYANOCOBALAMIN 1000 MCG/ML IJ SOLN
1000.0000 ug | Freq: Once | INTRAMUSCULAR | Status: AC
  Administered 2016-12-27: 1000 ug via INTRAMUSCULAR

## 2017-01-03 ENCOUNTER — Other Ambulatory Visit: Payer: Self-pay | Admitting: Internal Medicine

## 2017-01-03 NOTE — Telephone Encounter (Signed)
Refill x 1 year 

## 2017-01-03 NOTE — Telephone Encounter (Signed)
Please advise Sir? 

## 2017-01-24 ENCOUNTER — Ambulatory Visit (INDEPENDENT_AMBULATORY_CARE_PROVIDER_SITE_OTHER): Payer: Medicare Other

## 2017-01-24 DIAGNOSIS — E538 Deficiency of other specified B group vitamins: Secondary | ICD-10-CM

## 2017-01-24 MED ORDER — CYANOCOBALAMIN 1000 MCG/ML IJ SOLN
1000.0000 ug | Freq: Once | INTRAMUSCULAR | Status: AC
  Administered 2017-01-24: 1000 ug via INTRAMUSCULAR

## 2017-01-24 NOTE — Progress Notes (Signed)
Patient here for b12 injection; injection given IM to left deltoid; patient tolerated well; no s/s of reaction; patient scheduled for nurse visit in one month for next injection.

## 2017-02-28 ENCOUNTER — Ambulatory Visit (INDEPENDENT_AMBULATORY_CARE_PROVIDER_SITE_OTHER): Payer: Medicare Other | Admitting: *Deleted

## 2017-02-28 DIAGNOSIS — E538 Deficiency of other specified B group vitamins: Secondary | ICD-10-CM

## 2017-02-28 MED ORDER — CYANOCOBALAMIN 1000 MCG/ML IJ SOLN
1000.0000 ug | Freq: Once | INTRAMUSCULAR | Status: AC
  Administered 2017-02-28: 1000 ug via INTRAMUSCULAR

## 2017-02-28 NOTE — Progress Notes (Signed)
Patient here for b12 injection; administered injection IM to left deltoid. Patient tolerated well; no s/s of reactions.

## 2017-03-12 ENCOUNTER — Other Ambulatory Visit: Payer: Self-pay | Admitting: Family Medicine

## 2017-03-20 ENCOUNTER — Encounter: Payer: Self-pay | Admitting: Family Medicine

## 2017-03-20 ENCOUNTER — Ambulatory Visit (INDEPENDENT_AMBULATORY_CARE_PROVIDER_SITE_OTHER): Payer: Medicare Other | Admitting: Family Medicine

## 2017-03-20 VITALS — BP 133/65 | HR 52 | Temp 98.3°F | Ht 66.0 in | Wt 132.0 lb

## 2017-03-20 DIAGNOSIS — I70212 Atherosclerosis of native arteries of extremities with intermittent claudication, left leg: Secondary | ICD-10-CM | POA: Diagnosis not present

## 2017-03-20 DIAGNOSIS — J209 Acute bronchitis, unspecified: Secondary | ICD-10-CM | POA: Diagnosis not present

## 2017-03-20 MED ORDER — AMOXICILLIN-POT CLAVULANATE 875-125 MG PO TABS: 1.0000 | ORAL_TABLET | Freq: Two times a day (BID) | ORAL | 0 refills | Status: DC

## 2017-03-20 NOTE — Progress Notes (Signed)
   Subjective:    Patient ID: Earl Castillo, male    DOB: [birth year], 76 y.o.   MRN: [id number]  HPI Here for one week of chest congestion and coughing up yellow sputum. No fever. On Mucinex.    Review of Systems  Constitutional: Negative.   HENT: Negative.   Eyes: Negative.   Respiratory: Positive for cough, chest tightness, shortness of breath and wheezing.   Cardiovascular: Negative.        Objective:   Physical Exam  Constitutional: He appears well-developed and well-nourished.  HENT:  Right Ear: External ear normal.  Left Ear: External ear normal.  Nose: Nose normal.  Mouth/Throat: Oropharynx is clear and moist.  Eyes: Conjunctivae are normal.  Neck: No thyromegaly present.  Pulmonary/Chest: Effort normal. No respiratory distress. He has no wheezes. He has no rales.  Scattered rhonchi   Lymphadenopathy:    He has no cervical adenopathy.          Assessment & Plan:  Bronchitis, treat with Augmentin. Alysia Penna, MD

## 2017-03-20 NOTE — Patient Instructions (Signed)
WE NOW OFFER   [facility] FAST TRACK!!!  SAME DAY Appointments for ACUTE CARE  Such as: Sprains, Injuries, cuts, abrasions, rashes, muscle pain, joint pain, back pain Colds, flu, sore throats, headache, allergies, cough, fever  Ear pain, sinus and eye infections Abdominal pain, nausea, vomiting, diarrhea, upset stomach Animal/insect bites  3 Easy Ways to Schedule: Walk-In Scheduling Call in scheduling Mychart Sign-up: [email or web address]         

## 2017-03-24 DIAGNOSIS — Z961 Presence of intraocular lens: Secondary | ICD-10-CM | POA: Diagnosis not present

## 2017-03-26 DIAGNOSIS — L905 Scar conditions and fibrosis of skin: Secondary | ICD-10-CM | POA: Diagnosis not present

## 2017-03-26 DIAGNOSIS — Z85828 Personal history of other malignant neoplasm of skin: Secondary | ICD-10-CM | POA: Diagnosis not present

## 2017-03-26 DIAGNOSIS — L570 Actinic keratosis: Secondary | ICD-10-CM | POA: Diagnosis not present

## 2017-03-27 ENCOUNTER — Other Ambulatory Visit: Payer: Self-pay | Admitting: Cardiovascular Disease

## 2017-03-27 NOTE — Telephone Encounter (Signed)
Rx(s) sent to pharmacy electronically.  

## 2017-03-28 ENCOUNTER — Other Ambulatory Visit: Payer: Self-pay | Admitting: Cardiovascular Disease

## 2017-04-01 ENCOUNTER — Ambulatory Visit (INDEPENDENT_AMBULATORY_CARE_PROVIDER_SITE_OTHER): Payer: Medicare Other

## 2017-04-01 DIAGNOSIS — E538 Deficiency of other specified B group vitamins: Secondary | ICD-10-CM

## 2017-04-01 MED ORDER — CYANOCOBALAMIN 1000 MCG/ML IJ SOLN
1000.0000 ug | Freq: Once | INTRAMUSCULAR | Status: AC
  Administered 2017-04-01: 1000 ug via INTRAMUSCULAR

## 2017-04-02 ENCOUNTER — Other Ambulatory Visit: Payer: Self-pay | Admitting: Cardiovascular Disease

## 2017-04-02 NOTE — Telephone Encounter (Signed)
Rx has been sent to the pharmacy electronically. ° °

## 2017-05-02 ENCOUNTER — Ambulatory Visit (INDEPENDENT_AMBULATORY_CARE_PROVIDER_SITE_OTHER): Payer: Medicare Other

## 2017-05-02 DIAGNOSIS — E538 Deficiency of other specified B group vitamins: Secondary | ICD-10-CM | POA: Diagnosis not present

## 2017-05-02 MED ORDER — CYANOCOBALAMIN 1000 MCG/ML IJ SOLN
1000.0000 ug | Freq: Once | INTRAMUSCULAR | Status: AC
  Administered 2017-05-02: 1000 ug via INTRAMUSCULAR

## 2017-05-06 ENCOUNTER — Ambulatory Visit (HOSPITAL_COMMUNITY)
Admission: RE | Admit: 2017-05-06 | Discharge: 2017-05-06 | Disposition: A | Discharge status: Home / Self Care | Payer: Medicare Other | Source: Ambulatory Visit | Attending: Cardiovascular Disease | Admitting: Cardiovascular Disease

## 2017-05-06 ENCOUNTER — Ambulatory Visit (HOSPITAL_COMMUNITY)
Admission: RE | Admit: 2017-05-06 | Discharge: 2017-05-06 | Disposition: A | Discharge status: Home / Self Care | Payer: Medicare Other | Source: Ambulatory Visit | Attending: Cardiology | Admitting: Cardiology

## 2017-05-06 DIAGNOSIS — I2510 Atherosclerotic heart disease of native coronary artery without angina pectoris: Secondary | ICD-10-CM | POA: Diagnosis not present

## 2017-05-06 DIAGNOSIS — I6529 Occlusion and stenosis of unspecified carotid artery: Secondary | ICD-10-CM

## 2017-05-06 DIAGNOSIS — I6523 Occlusion and stenosis of bilateral carotid arteries: Secondary | ICD-10-CM | POA: Insufficient documentation

## 2017-05-06 DIAGNOSIS — I10 Essential (primary) hypertension: Secondary | ICD-10-CM | POA: Diagnosis not present

## 2017-05-06 DIAGNOSIS — Z720 Tobacco use: Secondary | ICD-10-CM | POA: Insufficient documentation

## 2017-05-06 DIAGNOSIS — E785 Hyperlipidemia, unspecified: Secondary | ICD-10-CM | POA: Diagnosis not present

## 2017-05-06 DIAGNOSIS — I739 Peripheral vascular disease, unspecified: Secondary | ICD-10-CM | POA: Diagnosis not present

## 2017-05-06 DIAGNOSIS — I771 Stricture of artery: Secondary | ICD-10-CM | POA: Insufficient documentation

## 2017-05-12 ENCOUNTER — Other Ambulatory Visit: Payer: Self-pay | Admitting: Cardiovascular Disease

## 2017-05-12 DIAGNOSIS — I739 Peripheral vascular disease, unspecified: Secondary | ICD-10-CM

## 2017-05-26 ENCOUNTER — Other Ambulatory Visit: Payer: Self-pay | Admitting: Cardiovascular Disease

## 2017-05-26 NOTE — Telephone Encounter (Signed)
REFILL 

## 2017-05-29 ENCOUNTER — Other Ambulatory Visit: Payer: Self-pay | Admitting: Cardiovascular Disease

## 2017-05-29 DIAGNOSIS — I739 Peripheral vascular disease, unspecified: Principal | ICD-10-CM

## 2017-05-29 DIAGNOSIS — I779 Disorder of arteries and arterioles, unspecified: Secondary | ICD-10-CM

## 2017-05-30 ENCOUNTER — Ambulatory Visit (INDEPENDENT_AMBULATORY_CARE_PROVIDER_SITE_OTHER): Payer: Medicare Other

## 2017-05-30 DIAGNOSIS — E538 Deficiency of other specified B group vitamins: Secondary | ICD-10-CM

## 2017-05-30 MED ORDER — CYANOCOBALAMIN 1000 MCG/ML IJ SOLN
1000.0000 ug | Freq: Once | INTRAMUSCULAR | Status: AC
  Administered 2017-05-30: 1000 ug via INTRAMUSCULAR

## 2017-06-27 ENCOUNTER — Ambulatory Visit (INDEPENDENT_AMBULATORY_CARE_PROVIDER_SITE_OTHER): Payer: Medicare Other | Admitting: *Deleted

## 2017-06-27 DIAGNOSIS — E538 Deficiency of other specified B group vitamins: Secondary | ICD-10-CM | POA: Diagnosis not present

## 2017-06-27 MED ORDER — CYANOCOBALAMIN 1000 MCG/ML IJ SOLN
1000.0000 ug | Freq: Once | INTRAMUSCULAR | Status: AC
  Administered 2017-06-27: 1000 ug via INTRAMUSCULAR

## 2017-06-27 NOTE — Progress Notes (Signed)
Pt here for B12 injection. Patient tolerated injection well. Follow-up appt scheduled w/ PCP next month.  Dorrene German, RN

## 2017-07-02 ENCOUNTER — Other Ambulatory Visit: Payer: Self-pay | Admitting: Physician Assistant

## 2017-07-02 DIAGNOSIS — I25810 Atherosclerosis of coronary artery bypass graft(s) without angina pectoris: Secondary | ICD-10-CM

## 2017-07-03 NOTE — Telephone Encounter (Signed)
Refill Request.  

## 2017-08-01 ENCOUNTER — Encounter: Payer: Self-pay | Admitting: Family Medicine

## 2017-08-01 ENCOUNTER — Ambulatory Visit (INDEPENDENT_AMBULATORY_CARE_PROVIDER_SITE_OTHER): Payer: Medicare Other | Admitting: Family Medicine

## 2017-08-01 VITALS — BP 162/86 | Temp 98.8°F | Ht 66.0 in | Wt 132.0 lb

## 2017-08-01 DIAGNOSIS — J018 Other acute sinusitis: Secondary | ICD-10-CM | POA: Diagnosis not present

## 2017-08-01 DIAGNOSIS — E538 Deficiency of other specified B group vitamins: Secondary | ICD-10-CM

## 2017-08-01 DIAGNOSIS — I70212 Atherosclerosis of native arteries of extremities with intermittent claudication, left leg: Secondary | ICD-10-CM | POA: Diagnosis not present

## 2017-08-01 MED ORDER — CYANOCOBALAMIN 1000 MCG/ML IJ SOLN
1000.0000 ug | Freq: Once | INTRAMUSCULAR | Status: AC
  Administered 2017-08-01: 1000 ug via INTRAMUSCULAR

## 2017-08-01 MED ORDER — AMOXICILLIN-POT CLAVULANATE 875-125 MG PO TABS: 1.0000 | ORAL_TABLET | Freq: Two times a day (BID) | ORAL | 0 refills | Status: DC

## 2017-08-01 NOTE — Patient Instructions (Signed)
WE NOW OFFER   [facility] FAST TRACK!!!  SAME DAY Appointments for ACUTE CARE  Such as: Sprains, Injuries, cuts, abrasions, rashes, muscle pain, joint pain, back pain Colds, flu, sore throats, headache, allergies, cough, fever  Ear pain, sinus and eye infections Abdominal pain, nausea, vomiting, diarrhea, upset stomach Animal/insect bites  3 Easy Ways to Schedule: Walk-In Scheduling Call in scheduling Mychart Sign-up: [email or web address]         

## 2017-08-01 NOTE — Addendum Note (Signed)
Addended by: Aggie Hacker A on: 08/01/2017 02:47 PM   Modules accepted: Orders

## 2017-08-01 NOTE — Progress Notes (Signed)
   Subjective:    Patient ID: Earl Castillo, male    DOB: Jan 01, 1941, 76 y.o.   MRN: [id number]  HPI Here for 4 days of sinus congestion, PND, and coughing up yellow sputum. No fever.    Review of Systems  Constitutional: Negative.   HENT: Positive for congestion, postnasal drip, sinus pain and sinus pressure. Negative for sore throat.   Eyes: Negative.   Respiratory: Positive for cough.        Objective:   Physical Exam  HENT:  Right Ear: External ear normal.  Left Ear: External ear normal.  Nose: Nose normal.  Mouth/Throat: Oropharynx is clear and moist.  Eyes: Conjunctivae are normal.  Neck: No thyromegaly present.  Pulmonary/Chest: Effort normal and breath sounds normal. No respiratory distress. He has no wheezes. He has no rales.  Lymphadenopathy:    He has no cervical adenopathy.          Assessment & Plan:  Sinusitis, treat with Augmentin. Add Mucinex prn.  Alysia Penna, MD

## 2017-08-08 ENCOUNTER — Other Ambulatory Visit: Payer: Self-pay | Admitting: Cardiovascular Disease

## 2017-08-22 ENCOUNTER — Encounter: Payer: Self-pay | Admitting: Family Medicine

## 2017-08-22 ENCOUNTER — Ambulatory Visit (INDEPENDENT_AMBULATORY_CARE_PROVIDER_SITE_OTHER)
Admission: RE | Admit: 2017-08-22 | Discharge: 2017-08-22 | Disposition: A | Discharge status: Home / Self Care | Payer: Medicare Other | Source: Ambulatory Visit | Attending: Family Medicine | Admitting: Family Medicine

## 2017-08-22 ENCOUNTER — Ambulatory Visit (INDEPENDENT_AMBULATORY_CARE_PROVIDER_SITE_OTHER): Payer: Medicare Other | Admitting: Family Medicine

## 2017-08-22 VITALS — BP 138/62 | Temp 98.3°F | Ht 66.0 in | Wt 134.0 lb

## 2017-08-22 DIAGNOSIS — R05 Cough: Secondary | ICD-10-CM

## 2017-08-22 DIAGNOSIS — R9389 Abnormal findings on diagnostic imaging of other specified body structures: Secondary | ICD-10-CM

## 2017-08-22 DIAGNOSIS — R059 Cough, unspecified: Secondary | ICD-10-CM

## 2017-08-22 DIAGNOSIS — Z23 Encounter for immunization: Secondary | ICD-10-CM

## 2017-08-22 DIAGNOSIS — E782 Mixed hyperlipidemia: Secondary | ICD-10-CM | POA: Diagnosis not present

## 2017-08-22 DIAGNOSIS — D500 Iron deficiency anemia secondary to blood loss (chronic): Secondary | ICD-10-CM | POA: Diagnosis not present

## 2017-08-22 DIAGNOSIS — M159 Polyosteoarthritis, unspecified: Secondary | ICD-10-CM

## 2017-08-22 DIAGNOSIS — I10 Essential (primary) hypertension: Secondary | ICD-10-CM

## 2017-08-22 DIAGNOSIS — M150 Primary generalized (osteo)arthritis: Secondary | ICD-10-CM

## 2017-08-22 DIAGNOSIS — I70212 Atherosclerosis of native arteries of extremities with intermittent claudication, left leg: Secondary | ICD-10-CM

## 2017-08-22 DIAGNOSIS — E538 Deficiency of other specified B group vitamins: Secondary | ICD-10-CM

## 2017-08-22 DIAGNOSIS — I739 Peripheral vascular disease, unspecified: Secondary | ICD-10-CM | POA: Diagnosis not present

## 2017-08-22 LAB — CBC WITH DIFFERENTIAL/PLATELET
BASOS PCT: 0.4 % (ref 0.0–3.0)
Basophils Absolute: 0 10*3/uL (ref 0.0–0.1)
EOS ABS: 0.1 10*3/uL (ref 0.0–0.7)
Eosinophils Relative: 0.8 % (ref 0.0–5.0)
HEMATOCRIT: 43.5 % (ref 39.0–52.0)
Hemoglobin: 14.4 g/dL (ref 13.0–17.0)
LYMPHS ABS: 2.2 10*3/uL (ref 0.7–4.0)
Lymphocytes Relative: 19.6 % (ref 12.0–46.0)
MCHC: 33.2 g/dL (ref 30.0–36.0)
MCV: 100.7 fl — ABNORMAL HIGH (ref 78.0–100.0)
MONO ABS: 0.7 10*3/uL (ref 0.1–1.0)
Monocytes Relative: 6.5 % (ref 3.0–12.0)
NEUTROS ABS: 8.1 10*3/uL — AB (ref 1.4–7.7)
Neutrophils Relative %: 72.7 % (ref 43.0–77.0)
PLATELETS: 169 10*3/uL (ref 150.0–400.0)
RBC: 4.32 Mil/uL (ref 4.22–5.81)
RDW: 13.4 % (ref 11.5–15.5)
WBC: 11.1 10*3/uL — ABNORMAL HIGH (ref 4.0–10.5)

## 2017-08-22 LAB — BASIC METABOLIC PANEL
BUN: 18 mg/dL (ref 6–23)
CHLORIDE: 100 meq/L (ref 96–112)
CO2: 33 mEq/L — ABNORMAL HIGH (ref 19–32)
Calcium: 9.2 mg/dL (ref 8.4–10.5)
Creatinine, Ser: 1.02 mg/dL (ref 0.40–1.50)
GFR: 75.44 mL/min (ref >60.00)
Glucose, Bld: 100 mg/dL — ABNORMAL HIGH (ref 70–99)
POTASSIUM: 3.6 meq/L (ref 3.5–5.1)
SODIUM: 139 meq/L (ref 135–145)

## 2017-08-22 LAB — HEPATIC FUNCTION PANEL
ALBUMIN: 3.7 g/dL (ref 3.5–5.2)
ALT: 10 U/L (ref 0–53)
AST: 16 U/L (ref 0–37)
Alkaline Phosphatase: 58 U/L (ref 39–117)
BILIRUBIN DIRECT: 0.1 mg/dL (ref 0.0–0.3)
BILIRUBIN TOTAL: 0.6 mg/dL (ref 0.2–1.2)
Total Protein: 6.4 g/dL (ref 6.0–8.3)

## 2017-08-22 LAB — LIPID PANEL
CHOL/HDL RATIO: 2
Cholesterol: 108 mg/dL (ref 0–200)
HDL: 55.3 mg/dL (ref >39.00)
LDL CALC: 44 mg/dL (ref 0–99)
NONHDL: 52.99
TRIGLYCERIDES: 44 mg/dL (ref 0.0–149.0)
VLDL: 8.8 mg/dL (ref 0.0–40.0)

## 2017-08-22 LAB — POC URINALSYSI DIPSTICK (AUTOMATED)
BILIRUBIN UA: NEGATIVE
GLUCOSE UA: NEGATIVE
KETONES UA: NEGATIVE
Leukocytes, UA: NEGATIVE
Nitrite, UA: NEGATIVE
SPEC GRAV UA: 1.025 (ref 1.010–1.025)
Urobilinogen, UA: 0.2 E.U./dL
pH, UA: 6 (ref 5.0–8.0)

## 2017-08-22 LAB — PSA: PSA: 1.23 ng/mL (ref 0.10–4.00)

## 2017-08-22 LAB — VITAMIN B12: VITAMIN B 12: 606 pg/mL (ref 211–911)

## 2017-08-22 LAB — TSH: TSH: 1.23 u[IU]/mL (ref 0.35–4.50)

## 2017-08-22 MED ORDER — CYANOCOBALAMIN 1000 MCG/ML IJ SOLN
1000.0000 ug | Freq: Once | INTRAMUSCULAR | Status: AC
  Administered 2017-08-22: 1000 ug via INTRAMUSCULAR

## 2017-08-22 NOTE — Progress Notes (Signed)
   Subjective:    Patient ID: Earl Castillo, male    DOB: 1941/03/17, 76 y.o.   MRN: [id number]  HPI Here to follow up on issues. He feels well. His BP is stable. His arthritis is stable. He is active and his appetite and sleep are intact.    Review of Systems  Constitutional: Negative.   HENT: Negative.   Eyes: Negative.   Respiratory: Negative.   Cardiovascular: Negative.   Gastrointestinal: Negative.   Genitourinary: Negative.   Musculoskeletal: Negative.   Skin: Negative.   Neurological: Negative.   Psychiatric/Behavioral: Negative.        Objective:   Physical Exam  Constitutional: He is oriented to person, place, and time. He appears well-developed and well-nourished. No distress.  HENT:  Head: Normocephalic and atraumatic.  Right Ear: External ear normal.  Left Ear: External ear normal.  Nose: Nose normal.  Mouth/Throat: Oropharynx is clear and moist. No oropharyngeal exudate.  Eyes: Pupils are equal, round, and reactive to light. Conjunctivae and EOM are normal. Right eye exhibits no discharge. Left eye exhibits no discharge. No scleral icterus.  Neck: Neck supple. No JVD present. No tracheal deviation present. No thyromegaly present.  Cardiovascular: Normal rate, regular rhythm, normal heart sounds and intact distal pulses.  Exam reveals no gallop and no friction rub.   No murmur heard. Pulmonary/Chest: Effort normal and breath sounds normal. No respiratory distress. He has no wheezes. He has no rales. He exhibits no tenderness.  Abdominal: Soft. Bowel sounds are normal. He exhibits no distension and no mass. There is no tenderness. There is no rebound and no guarding.  Genitourinary: Rectum normal, prostate normal and penis normal. Rectal exam shows guaiac negative stool. No penile tenderness.  Musculoskeletal: Normal range of motion. He exhibits no edema or tenderness.  Lymphadenopathy:    He has no cervical adenopathy.  Neurological: He is alert and oriented to  person, place, and time. He has normal reflexes. No cranial nerve deficit. He exhibits normal muscle tone. Coordination normal.  Skin: Skin is warm and dry. No rash noted. He is not diaphoretic. No erythema. No pallor.  Psychiatric: He has a normal mood and affect. His behavior is normal. Judgment and thought content normal.          Assessment & Plan:  His HTN is stable. Arthritis is controlled. He will get fasting lbs to check lipids, etc. Check a B12 level. We will set up a CXR soon. No signs of recent GI bleeding.  Alysia Penna, MD

## 2017-08-22 NOTE — Addendum Note (Signed)
Addended by: Alysia Penna A on: 08/22/2017 04:44 PM   Modules accepted: Orders

## 2017-08-23 ENCOUNTER — Other Ambulatory Visit: Payer: Self-pay | Admitting: Cardiovascular Disease

## 2017-08-25 ENCOUNTER — Ambulatory Visit (INDEPENDENT_AMBULATORY_CARE_PROVIDER_SITE_OTHER)
Admission: RE | Admit: 2017-08-25 | Discharge: 2017-08-25 | Disposition: A | Discharge status: Home / Self Care | Payer: Medicare Other | Source: Ambulatory Visit | Attending: Family Medicine | Admitting: Family Medicine

## 2017-08-25 DIAGNOSIS — R9389 Abnormal findings on diagnostic imaging of other specified body structures: Secondary | ICD-10-CM | POA: Diagnosis not present

## 2017-08-25 DIAGNOSIS — R918 Other nonspecific abnormal finding of lung field: Secondary | ICD-10-CM | POA: Diagnosis not present

## 2017-09-02 ENCOUNTER — Ambulatory Visit (INDEPENDENT_AMBULATORY_CARE_PROVIDER_SITE_OTHER): Payer: Medicare Other | Admitting: Cardiovascular Disease

## 2017-09-02 ENCOUNTER — Encounter: Payer: Self-pay | Admitting: Cardiovascular Disease

## 2017-09-02 VITALS — BP 146/54 | HR 49 | Ht 66.5 in | Wt 136.4 lb

## 2017-09-02 DIAGNOSIS — I6523 Occlusion and stenosis of bilateral carotid arteries: Secondary | ICD-10-CM

## 2017-09-02 DIAGNOSIS — I10 Essential (primary) hypertension: Secondary | ICD-10-CM | POA: Diagnosis not present

## 2017-09-02 DIAGNOSIS — I739 Peripheral vascular disease, unspecified: Secondary | ICD-10-CM

## 2017-09-02 NOTE — Assessment & Plan Note (Signed)
History of hyperlipidemia on statin therapy with recent lipid profile performed 08/22/17 revealing a total cholesterol 108, LDL 44 and HDL of 55

## 2017-09-02 NOTE — Assessment & Plan Note (Signed)
History of coronary artery disease status post bypass grafting in 2004 by [name] the LIMA to his LAD, RIMA to the circumflex marginal and vein to distal RCA. Myoview performed 08/23/12 was low risk and nonischemic. He denies chest pain or shortness of breath.

## 2017-09-02 NOTE — Assessment & Plan Note (Signed)
History of carotid artery disease status post remote right carotid endarterectomy performed by Dr. Lindwood Qua in 2006. His most recent Dopplers performed 05/06/17 revealed a patent endarterectomy site with moderate left ICA stenosis. We will repeat carotid Dopplers next year.

## 2017-09-02 NOTE — Assessment & Plan Note (Signed)
History of essential hypertension blood pressure measured at 146/54. He is on losartan, hydrochlorothiazide and metoprolol as well as Procardia. Continue current meds at current dosing

## 2017-09-02 NOTE — Progress Notes (Signed)
09/02/2017 Earl Castillo   July 23, 1941  [id number]  Primary Physician [name], MD Primary Cardiologist: [name] MD Lupe Carney, [place]  HPI:  Earl Castillo is a 76 y.o. male  previously followed by [name] and now followed by myself. I last saw him in the office 08/28/16. Since I saw him in the office his wife of 35 years unfortunately passed away 06-17-17. He is living with his oldest son currently. The last time I saw him was at the time of his intervention in July. His past medical history is significant for CAD, status post coronary artery bypass grafting in 2004 by [name], with a LIMA to his LAD, RIMA to the circumflex marginal and SVG to the distal RCA. He had a Myoview stress test performed 08/23/2012 which was low risk and nonischemic. He also has hypertension and hyperlipidemia. He has a 60+ year history of tobacco use and continues to smoke daily. He has significant peripheral vascular disease. He is status post right carotid endarterectomy performed by [name] in 2006. We have been following his carotid disease yearly by duplex ultrasound. He has also had a left to right fem-fem crossover graft by [name] in May of 2000. He was recently seen by [name] and complained of worsening claudication. Recent lower extremity arterial Doppler studies demonstrated progression of disease in his left common and external iliac arteries. In addition, it was suggested that he had moderately severe distal left SFA disease. Subsequently, on 03/17/2014 he underwent a diagnostic PV angiogram performed by [name]. His fem-fem crossover graft was widely patent. His left iliac also appeared widely patent, suggesting falsely elevated velocities by duplex. He did have a high grade focal calcified mid to distal left SFA stenosis corresponding to an area of abnormality and high velocity in the duplex ultrasound with three-vessel runoff. I performed diamondback orbital  rotational atherectomy of his mid left SFA through his femorofemoral crossover graft 05/23/14 with stenting of his proximal left SFA as well. His subsequent Dopplers revealed an increase in his left ABI from 0.8-1.0 . His claudication has resolved. Since I saw him in the office 03/07/16 he denies chest pain. He did have fairly new onset left lower extremity claudication over the last several months with recent Dopplers that showed decline in his left ABI down to 0.4 with an occluded left SFA. I performed angiography on him 06/06/16 revealing a widely patent left iliac system, left to right femorofemoral crossover graft with an occluded left SFA at the origin reconstituting in the adductor canal by profunda femoris collaterals with three-vessel runoff. He also underwent elective left above-the-knee femoropopliteal bypass grafting with a 6 dominant external ring PTFE graft by Dr. [name] 08/09/16. His wounds are healing well. His claudication is improved. He did have some swelling of his lower extremity which she is treating by leg elevation. Since I saw me or go his remain stable. He denies chest pain, shortness of breath or claudication. Recent Dopplers performed 05/06/17 revealed ABIs and 8.9 range bilaterally with a patent femorofemoral crossover graft and left femoropopliteal bypass graft. Carotid Dopplers revealed a widely patent right carotid endarterectomy site with moderate left ICA stenosis.   Current Meds  Medication Sig  . acetaminophen (TYLENOL) 500 MG tablet Take 1,000 mg by mouth daily as needed (for leg pain).   Marland Kitchen aspirin EC 81 MG tablet Take 81 mg by mouth every evening.   . clopidogrel (PLAVIX) 75 MG  tablet TAKE 1 TABLET (75 MG TOTAL) BY MOUTH DAILY.  . cyanocobalamin (,VITAMIN B-12,) 1000 MCG/ML injection Inject 1,000 mcg into the muscle every 30 (thirty) days.   Marland Kitchen docusate sodium (COLACE) 100 MG capsule Take 100 mg by mouth daily as needed (for constipation).   . ferrous gluconate (FERGON) 324  MG tablet TAKE 1 TABLET BY MOUTH EVERY DAY WITH BREAKFAST  . isosorbide mononitrate (IMDUR) 30 MG 24 hr tablet TAKE 1 TABLET BY MOUTH EVERY DAY  . losartan-hydrochlorothiazide (HYZAAR) 100-25 MG tablet Take 1 tablet by mouth every evening.  . metoprolol (LOPRESSOR) 50 MG tablet TAKE 1 TABLET BY MOUTH EVERY DAY  . NIFEdipine (PROCARDIA XL/ADALAT-CC) 90 MG 24 hr tablet TAKE 1 TABLET BY MOUTH EVERY DAY  . ranitidine (ZANTAC) 150 MG tablet Take 150 mg by mouth daily as needed for heartburn.  . simvastatin (ZOCOR) 20 MG tablet TAKE 1 TABLET (20 MG TOTAL) BY MOUTH DAILY AT 6 PM. NEED OV.     No Known Allergies  Social History   Social History  . Marital status: Married    Spouse name: N/A  . Number of children: 2  . Years of education: N/A   Occupational History  .  Semi Retired Geophysicist/field seismologist    Social History Main Topics  . Smoking status: Current Some Day Smoker    Packs/day: 0.25    Years: 58.00    Types: Cigarettes  . Smokeless tobacco: Never Used     Comment: rarely smokes now; does have approx 38 pack hx  of smoking/ does now the LDCT   . Alcohol use No  . Drug use: No  . Sexual activity: Not Currently   Other Topics Concern  . Not on file   Social History Narrative   Daily caffeine      Review of Systems: General: negative for chills, fever, night sweats or weight changes.  Cardiovascular: negative for chest pain, dyspnea on exertion, edema, orthopnea, palpitations, paroxysmal nocturnal dyspnea or shortness of breath Dermatological: negative for rash Respiratory: negative for cough or wheezing Urologic: negative for hematuria Abdominal: negative for nausea, vomiting, diarrhea, bright red blood per rectum, melena, or hematemesis Neurologic: negative for visual changes, syncope, or dizziness All other systems reviewed and are otherwise negative except as noted above.    Blood pressure (!) 146/54, pulse (!) 49, height 5' 6.5" (1.689 m), weight 136 lb 6.4 oz (61.9 kg).    General appearance: alert and no distress Neck: no adenopathy, no JVD, supple, symmetrical, trachea midline, thyroid not enlarged, symmetric, no tenderness/mass/nodules and Bilateral carotid bruits Lungs: clear to auscultation bilaterally Heart: regular rate and rhythm, S1, S2 normal, no murmur, click, rub or gallop Extremities: extremities normal, atraumatic, no cyanosis or edema Pulses: 2+ and symmetric Skin: Skin color, texture, turgor normal. No rashes or lesions Neurologic: Alert and oriented X 3, normal strength and tone. Normal symmetric reflexes. Normal coordination and gait  EKG sinus bradycardia 49 with evidence of left ventricle hypertrophy by voltage. I personally reviewed this EKG.  ASSESSMENT AND PLAN:   Hyperlipidemia History of hyperlipidemia on statin therapy with recent lipid profile performed 08/22/17 revealing a total cholesterol 108, LDL 44 and HDL of 55  Coronary atherosclerosis History of coronary artery disease status post bypass grafting in 2004 by [name] the LIMA to his LAD, RIMA to the circumflex marginal and vein to distal RCA. Myoview performed 08/23/12 was low risk and nonischemic. He denies chest pain or shortness of breath.  PVD- Lt SFA PTA/stent  05/23/14 History of peripheral arterial disease status post femorofemoral crossover grafting by [name] May 2000 with a known occluded right common iliac artery. I performed a orbital rotational atherectomy of his mid left SFA through a femorofemoral bypass graft 05/23/14 with stenting of his proximal left SFA as well at that time. Because of new onset claudication after that with Dopplers that had revealed a decline in his left ABI performed angiography on him 06/06/16 revealing a widely patent left iliac system, femorofemoral crossover graft with an occluded SFA at its origin reconstituting in the adductor canal by profunda femoris collaterals. He simply underwent elective left femoral above the knee bypass grafting  using a 6 mm PTFE graft by Dr. [name] 08/09/16. He did have a wound on his foot that healed subsequently has claudication improved as well. His most recent Dopplers performed 05/06/17 revealed ABIs in the 0.9 range bilaterally with patent femoropopliteal bypass graft and femorofemoral crossover graft.  Essential hypertension History of essential hypertension blood pressure measured at 146/54. He is on losartan, hydrochlorothiazide and metoprolol as well as Procardia. Continue current meds at current dosing  Carotid artery disease History of carotid artery disease status post remote right carotid endarterectomy performed by Dr. Lindwood Qua in 2006. His most recent Dopplers performed 05/06/17 revealed a patent endarterectomy site with moderate left ICA stenosis. We will repeat carotid Dopplers next year.      [name] MD FACP,FACC,FAHA, [facility] 09/02/2017 11:58 AM

## 2017-09-02 NOTE — Assessment & Plan Note (Signed)
History of peripheral arterial disease status post femorofemoral crossover grafting by [name] May 2000 with a known occluded right common iliac artery. I performed a orbital rotational atherectomy of his mid left SFA through a femorofemoral bypass graft 05/23/14 with stenting of his proximal left SFA as well at that time. Because of new onset claudication after that with Dopplers that had revealed a decline in his left ABI performed angiography on him 06/06/16 revealing a widely patent left iliac system, femorofemoral crossover graft with an occluded SFA at its origin reconstituting in the adductor canal by profunda femoris collaterals. He simply underwent elective left femoral above the knee bypass grafting using a 6 mm PTFE graft by Dr. [name] 08/09/16. He did have a wound on his foot that healed subsequently has claudication improved as well. His most recent Dopplers performed 05/06/17 revealed ABIs in the 0.9 range bilaterally with patent femoropopliteal bypass graft and femorofemoral crossover graft.

## 2017-09-02 NOTE — Patient Instructions (Signed)
Medication Instructions: Your physician recommends that you continue on your current medications as directed. Please refer to the Current Medication list given to you today.  Testing/Procedures: Your physician has requested that you have a lower extremity arterial duplex. During this test, ultrasound is used to evaluate arterial blood flow in the legs. Allow one hour for this exam. There are no restrictions or special instructions.  Your physician has requested that you have an ankle brachial index (ABI). During this test an ultrasound and blood pressure cuff are used to evaluate the arteries that supply the arms and legs with blood. Allow thirty minutes for this exam. There are no restrictions or special instructions.  Your physician has requested that you have a carotid duplex. This test is an ultrasound of the carotid arteries in your neck. It looks at blood flow through these arteries that supply the brain with blood. Allow one hour for this exam. There are no restrictions or special instructions. IN July 2019   Follow-Up: Your physician wants you to follow-up in: 1 year with [name]. You will receive a reminder letter in the mail two months in advance. If you don't receive a letter, please call our office to schedule the follow-up appointment.  If you need a refill on your cardiac medications before your next appointment, please call your pharmacy.

## 2017-09-08 ENCOUNTER — Other Ambulatory Visit: Payer: Self-pay | Admitting: Family Medicine

## 2017-09-29 DIAGNOSIS — L820 Inflamed seborrheic keratosis: Secondary | ICD-10-CM | POA: Diagnosis not present

## 2017-09-29 DIAGNOSIS — L570 Actinic keratosis: Secondary | ICD-10-CM | POA: Diagnosis not present

## 2017-10-01 ENCOUNTER — Other Ambulatory Visit: Payer: Self-pay | Admitting: Cardiovascular Disease

## 2017-10-01 DIAGNOSIS — I25810 Atherosclerosis of coronary artery bypass graft(s) without angina pectoris: Secondary | ICD-10-CM

## 2017-10-03 ENCOUNTER — Ambulatory Visit (INDEPENDENT_AMBULATORY_CARE_PROVIDER_SITE_OTHER): Payer: Medicare Other

## 2017-10-03 DIAGNOSIS — E538 Deficiency of other specified B group vitamins: Secondary | ICD-10-CM

## 2017-10-03 MED ORDER — CYANOCOBALAMIN 1000 MCG/ML IJ SOLN
1000.0000 ug | Freq: Once | INTRAMUSCULAR | Status: AC
  Administered 2017-10-03: 1000 ug via INTRAMUSCULAR

## 2017-10-07 ENCOUNTER — Ambulatory Visit (INDEPENDENT_AMBULATORY_CARE_PROVIDER_SITE_OTHER): Payer: Medicare Other | Admitting: Adult Health

## 2017-10-07 ENCOUNTER — Encounter: Payer: Self-pay | Admitting: Adult Health

## 2017-10-07 VITALS — BP 180/70 | Temp 98.2°F | Wt 138.0 lb

## 2017-10-07 DIAGNOSIS — I6523 Occlusion and stenosis of bilateral carotid arteries: Secondary | ICD-10-CM | POA: Diagnosis not present

## 2017-10-07 DIAGNOSIS — J0141 Acute recurrent pansinusitis: Secondary | ICD-10-CM

## 2017-10-07 MED ORDER — AMOXICILLIN-POT CLAVULANATE 875-125 MG PO TABS: 1.0000 | ORAL_TABLET | Freq: Two times a day (BID) | ORAL | 0 refills | Status: DC

## 2017-10-07 NOTE — Progress Notes (Signed)
Subjective:    Patient ID: Earl Castillo, male    DOB: [birth year], 76 y.o.   MRN: [id number]  Sinusitis  This is a recurrent problem. The current episode started 1 to 4 weeks ago (2 weeks ). The problem has been gradually worsening since onset. There has been no fever. Associated symptoms include coughing, headaches and sinus pressure. Pertinent negatives include no chills.      Review of Systems  Constitutional: Positive for activity change and fatigue. Negative for appetite change, chills and fever.  HENT: Positive for postnasal drip, rhinorrhea, sinus pressure and sinus pain.   Respiratory: Positive for cough.   Neurological: Positive for headaches.   Past Medical History:  Diagnosis Date  . Anemia   . Angiodysplasia of intestine - small bowel  09/21/2013  . Blood transfusion abn reaction or complication, no procedure mishap    "HgB dropped real low"  . Cancer [facility])    Skin cancers  . Carotid artery disease ([place])    status post carotid endarterectomy by [name]  . Chicken pox   . Colon polyps 08/2012   Hyperplastic and adenomatous sessile polyps  . Coronary artery disease    CABG'04, Low risk Myoview 2013  . GERD (gastroesophageal reflux disease)   . Hemorrhoid   . History of blood transfusion 08/2013   "HgB dropped real low; related to bleeding from my rectum"  . History of kidney stones    1 removed surgical, passed 2  . Hyperlipidemia   . Hypertension   . 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 Lower GI bleeding 08/2013  . Osteoarthritis    "hips" (05/23/2014)  . Peripheral arterial disease ([place])    status post left to right fem-fem crossover graft in by [name]  . Pneumonia    - "years ago"  . Sinus headache    "alot" (05/23/2014)  . Skin exam, screening for cancer    sees [name]   . Vitamin B12 deficiency 11/23/2013    Social History   Socioeconomic History  . Marital  status: Married    Spouse name: Not on file  . Number of children: 2  . Years of education: Not on file  . Highest education level: Not on file  Social Needs  . Financial resource strain: Not on file  . Food insecurity - worry: Not on file  . Food insecurity - inability: Not on file  . Transportation needs - medical: Not on file  . Transportation needs - non-medical: Not on file  Occupational History  . Occupation:  Semi Retired Geophysicist/field seismologist  Tobacco Use  . Smoking status: Current Some Day Smoker    Packs/day: 0.25    Years: 58.00    Pack years: 14.50    Types: Cigarettes  . Smokeless tobacco: Never Used  . Tobacco comment: rarely smokes now; does have approx 38 pack hx  of smoking/ does now the LDCT   Substance and Sexual Activity  . Alcohol use: No    Alcohol/week: 0.0 oz  . Drug use: No  . Sexual activity: Not Currently  Other Topics Concern  . Not on file  Social History Narrative   Daily caffeine     Past Surgical History:  Procedure Laterality Date  . BAND HEMORRHOIDECTOMY    . BASAL CELL CARCINOMA EXCISION  May 2009   per [name] , left nose   . basket extraction of ureteral stone  1970's  . Bypass rt leg Right 2000  . capsule endoscopy  10-05-13   per [name], 4 AVM's  . CARDIAC CATHETERIZATION  08/18/2003   Recommend CABG  . CARDIOVASCULAR STRESS TEST - LEXISCAN  08/25/2012   No significant wall motion abnormalities  . CAROTID ANGIOGRAM N/A 03/17/2014   Procedure: CAROTID ANGIOGRAM;  Surgeon: [name], MD;  Location: [facility] CATH LAB;  Service: Cardiovascular;  Laterality: N/A;  right carotid  . CAROTID ENDARTERECTOMY Right   . CAROTID ENDARTERECTOMY Right 03/26/2005   Dr Georgina Quint  . CATARACT EXTRACTION W/ INTRAOCULAR LENS  IMPLANT, BILATERAL Bilateral 2014   per [name]  . [place]   "bulging disc"  . COLONOSCOPY  08-12-12   per [name], tubular adenomas, repeat in 5 yrs   . CORONARY ARTERY BYPASS GRAFT   08/30/2003   x3. LIMA-distal LAD, RIMA-circumflex, SVG-distal RCA  . CYSTOSCOPY W/ STONE MANIPULATION    . ESOPHAGOGASTRODUODENOSCOPY N/A 08/17/2013   Procedure: ESOPHAGOGASTRODUODENOSCOPY (EGD);  Surgeon: [name], MD;  Location: [facility] ENDOSCOPY;  Service: Endoscopy;  Laterality: N/A;  . FEMORAL ARTERY STENT Left 05/23/2014  . FEMORAL-FEMORAL BYPASS GRAFT  2003  . FEMORAL-POPLITEAL BYPASS GRAFT Left 08/09/2016   Procedure: BYPASS GRAFT LEFT FEMORAL-POPLITEAL ARTERY USING GORE 6MM X 80CM PROPATEN GRAFT;  Surgeon: Serafina Mitchell, MD;  Location: [place] [place];  Service: Vascular;  Laterality: Left;  . LOWER EXTREMITY ANGIOGRAM N/A 03/17/2014   Procedure: LOWER EXTREMITY ANGIOGRAM;  Surgeon: [name], MD;  Location: [facility] CATH LAB;  Service: Cardiovascular;  Laterality: N/A;  . PERIPHERAL VASCULAR CATHETERIZATION N/A 06/06/2016   Procedure: Lower Extremity Angiography;  Surgeon: [name], MD;  Location: [place] CV LAB;  Service: Cardiovascular;  Laterality: N/A;  . PERIPHERAL VASCULAR CATHETERIZATION N/A 06/06/2016   Procedure: Abdominal Aortogram;  Surgeon: [name], MD;  Location: [place] CV LAB;  Service: Cardiovascular;  Laterality: N/A;  . RENAL DUPLEX Bilateral 02/02/2013   Bilateral renal arteries demonstrate narrowingconsistent with a 60-99% diameter reduction.  . TRANSTHORACIC ECHOCARDIOGRAM  08/02/2013   EF 55-60%, normal echocardiogram    Family History  Problem Relation Age of Onset  . Heart disease Father   . Heart disease Sister   . Coronary artery disease Brother        CABG x 6  . Heart disease Brother   . Coronary artery disease Brother        first MI 79, died at 66 during cardiac surgery  . Coronary artery disease Sister        CABG x 5, RAS  . Heart disease Sister   . Colon cancer Neg Hx     No Known Allergies  Current Outpatient Medications on File Prior to Visit  Medication Sig Dispense Refill  . acetaminophen (TYLENOL) 500 MG tablet Take 1,000  mg by mouth daily as needed (for leg pain).     Marland Kitchen aspirin EC 81 MG tablet Take 81 mg by mouth every evening.     . clopidogrel (PLAVIX) 75 MG tablet TAKE 1 TABLET (75 MG TOTAL) BY MOUTH DAILY. 30 tablet 11  . cyanocobalamin (,VITAMIN B-12,) 1000 MCG/ML injection Inject 1,000 mcg into the muscle every 30 (thirty) days.     Marland Kitchen docusate sodium (COLACE) 100 MG capsule Take 100 mg by mouth daily as needed (for constipation).     . ferrous gluconate (FERGON) 324 MG tablet TAKE 1 TABLET BY MOUTH EVERY DAY WITH BREAKFAST 90 tablet  3  . isosorbide mononitrate (IMDUR) 30 MG 24 hr tablet TAKE 1 TABLET BY MOUTH EVERY DAY 90 tablet 2  . losartan-hydrochlorothiazide (HYZAAR) 100-25 MG tablet Take 1 tablet by mouth every evening.    . metoprolol tartrate (LOPRESSOR) 50 MG tablet TAKE 1 TABLET BY MOUTH EVERY DAY 90 tablet 1  . NIFEdipine (PROCARDIA XL/ADALAT-CC) 90 MG 24 hr tablet TAKE 1 TABLET BY MOUTH EVERY DAY 90 tablet 3  . ranitidine (ZANTAC) 150 MG tablet Take 150 mg by mouth daily as needed for heartburn.    . simvastatin (ZOCOR) 20 MG tablet TAKE 1 TABLET (20 MG TOTAL) BY MOUTH DAILY AT 6 PM. NEED OV. 90 tablet 0   No current facility-administered medications on file prior to visit.     BP (!) 180/70   Temp 98.2 F (36.8 C) (Oral)   Wt 138 lb (62.6 kg)   BMI 21.94 kg/m       Objective:   Physical Exam  Constitutional: He is oriented to person, place, and time. He appears well-developed and well-nourished. No distress.  HENT:  Right Ear: Hearing, tympanic membrane, external ear and ear canal normal.  Left Ear: Hearing, tympanic membrane and external ear normal.  Nose: Mucosal edema and rhinorrhea present. Right sinus exhibits maxillary sinus tenderness and frontal sinus tenderness. Left sinus exhibits maxillary sinus tenderness and frontal sinus tenderness.  Mouth/Throat: Uvula is midline, oropharynx is clear and moist and mucous membranes are normal.  Eyes: Conjunctivae and EOM are normal.  Pupils are equal, round, and reactive to light. Right eye exhibits no discharge. Left eye exhibits no discharge. No scleral icterus.  Neck: Normal range of motion. Neck supple.  Cardiovascular: Normal rate, regular rhythm and intact distal pulses. Exam reveals no gallop.  No murmur heard. Pulmonary/Chest: Effort normal and breath sounds normal. No respiratory distress. He has no wheezes. He has no rales. He exhibits no tenderness.  Neurological: He is alert and oriented to person, place, and time.  Skin: Skin is warm and dry. No rash noted. He is not diaphoretic. No erythema. No pallor.  Psychiatric: He has a normal mood and affect. His behavior is normal. Judgment and thought content normal.  Nursing note and vitals reviewed.     Assessment & Plan:  1. Acute recurrent pansinusitis - amoxicillin-clavulanate (AUGMENTIN) 875-125 MG tablet; Take 1 tablet by mouth 2 (two) times daily.  Dispense: 14 tablet; Refill: 0 - add mucinex and flonase  - follow up if no improvement in the next 2-3 days or sooner if symptoms worsen   Dorothyann Peng, NP

## 2017-10-08 ENCOUNTER — Encounter: Payer: Self-pay | Admitting: Internal Medicine

## 2017-10-14 ENCOUNTER — Other Ambulatory Visit: Payer: Self-pay | Admitting: Cardiovascular Disease

## 2017-10-31 ENCOUNTER — Ambulatory Visit (INDEPENDENT_AMBULATORY_CARE_PROVIDER_SITE_OTHER): Payer: Medicare Other

## 2017-10-31 DIAGNOSIS — E538 Deficiency of other specified B group vitamins: Secondary | ICD-10-CM

## 2017-10-31 MED ORDER — CYANOCOBALAMIN 1000 MCG/ML IJ SOLN
1000.0000 ug | Freq: Once | INTRAMUSCULAR | Status: AC
  Administered 2017-10-31: 1000 ug via INTRAMUSCULAR

## 2017-11-11 ENCOUNTER — Other Ambulatory Visit: Payer: Self-pay | Admitting: Cardiology

## 2017-11-28 ENCOUNTER — Ambulatory Visit (INDEPENDENT_AMBULATORY_CARE_PROVIDER_SITE_OTHER): Payer: Medicare Other

## 2017-11-28 DIAGNOSIS — E538 Deficiency of other specified B group vitamins: Secondary | ICD-10-CM

## 2017-11-28 MED ORDER — CYANOCOBALAMIN 1000 MCG/ML IJ SOLN
1000.0000 ug | Freq: Once | INTRAMUSCULAR | Status: AC
  Administered 2017-11-28: 1000 ug via INTRAMUSCULAR

## 2017-12-12 ENCOUNTER — Other Ambulatory Visit: Payer: Self-pay | Admitting: Cardiovascular Disease

## 2017-12-12 NOTE — Telephone Encounter (Signed)
REFILL 

## 2017-12-20 ENCOUNTER — Other Ambulatory Visit: Payer: Self-pay | Admitting: Family Medicine

## 2017-12-26 ENCOUNTER — Ambulatory Visit (INDEPENDENT_AMBULATORY_CARE_PROVIDER_SITE_OTHER): Payer: Medicare Other | Admitting: Family Medicine

## 2017-12-26 DIAGNOSIS — E538 Deficiency of other specified B group vitamins: Secondary | ICD-10-CM | POA: Diagnosis not present

## 2017-12-26 MED ORDER — CYANOCOBALAMIN 1000 MCG/ML IJ SOLN
1000.0000 ug | Freq: Once | INTRAMUSCULAR | Status: AC
  Administered 2017-12-26: 1000 ug via INTRAMUSCULAR

## 2017-12-26 NOTE — Progress Notes (Signed)
Per orders of Dr. Sarajane Jews, injection of Vitamin B 12 1,000 mcg given by Aggie Hacker ANN. Patient tolerated injection well.

## 2018-01-02 ENCOUNTER — Other Ambulatory Visit: Payer: Self-pay | Admitting: Internal Medicine

## 2018-01-02 NOTE — Telephone Encounter (Signed)
Please advise Sir? He had a CBC last October.

## 2018-01-30 ENCOUNTER — Ambulatory Visit (INDEPENDENT_AMBULATORY_CARE_PROVIDER_SITE_OTHER): Payer: Medicare Other

## 2018-01-30 DIAGNOSIS — E538 Deficiency of other specified B group vitamins: Secondary | ICD-10-CM | POA: Diagnosis not present

## 2018-01-30 MED ORDER — CYANOCOBALAMIN 1000 MCG/ML IJ SOLN
1000.0000 ug | Freq: Once | INTRAMUSCULAR | Status: AC
  Administered 2018-01-30: 1000 ug via INTRAMUSCULAR

## 2018-01-30 NOTE — Progress Notes (Signed)
Per orders of Dr. Sarajane Jews, injection of Cyanocobalamin 1000 mcg/ml given by Wyvonne Lenz. Patient tolerated injection well.

## 2018-02-27 ENCOUNTER — Ambulatory Visit (INDEPENDENT_AMBULATORY_CARE_PROVIDER_SITE_OTHER): Payer: Medicare Other | Admitting: Family Medicine

## 2018-02-27 DIAGNOSIS — E538 Deficiency of other specified B group vitamins: Secondary | ICD-10-CM | POA: Diagnosis not present

## 2018-02-27 MED ORDER — CYANOCOBALAMIN 1000 MCG/ML IJ SOLN
1000.0000 ug | Freq: Once | INTRAMUSCULAR | Status: AC
  Administered 2018-02-27: 1000 ug via INTRAMUSCULAR

## 2018-02-27 NOTE — Progress Notes (Signed)
Per orders of Dr. Burchette, injection of Vitamin B 12 given by Roshana Shuffield ANN. Patient tolerated injection well. 

## 2018-03-12 ENCOUNTER — Other Ambulatory Visit: Payer: Self-pay | Admitting: Family Medicine

## 2018-03-31 DIAGNOSIS — L570 Actinic keratosis: Secondary | ICD-10-CM | POA: Diagnosis not present

## 2018-04-03 ENCOUNTER — Ambulatory Visit (INDEPENDENT_AMBULATORY_CARE_PROVIDER_SITE_OTHER): Payer: Medicare Other | Admitting: Family Medicine

## 2018-04-03 DIAGNOSIS — E538 Deficiency of other specified B group vitamins: Secondary | ICD-10-CM

## 2018-04-03 MED ORDER — CYANOCOBALAMIN 1000 MCG/ML IJ SOLN
1000.0000 ug | Freq: Once | INTRAMUSCULAR | Status: AC
  Administered 2018-04-03: 1000 ug via INTRAMUSCULAR

## 2018-04-03 NOTE — Progress Notes (Signed)
Per orders of Dr. Fry, injection of Vitamin B 12 given by NIMMONS, SYLVIA ANN. Patient tolerated injection well. 

## 2018-04-06 DIAGNOSIS — Z961 Presence of intraocular lens: Secondary | ICD-10-CM | POA: Diagnosis not present

## 2018-04-17 ENCOUNTER — Other Ambulatory Visit: Payer: Self-pay | Admitting: Cardiovascular Disease

## 2018-04-17 NOTE — Telephone Encounter (Signed)
Rx request sent to pharmacy.  

## 2018-05-01 ENCOUNTER — Ambulatory Visit (INDEPENDENT_AMBULATORY_CARE_PROVIDER_SITE_OTHER): Payer: Medicare Other | Admitting: *Deleted

## 2018-05-01 DIAGNOSIS — E538 Deficiency of other specified B group vitamins: Secondary | ICD-10-CM | POA: Diagnosis not present

## 2018-05-01 MED ORDER — CYANOCOBALAMIN 1000 MCG/ML IJ SOLN
1000.0000 ug | Freq: Once | INTRAMUSCULAR | Status: AC
  Administered 2018-05-01: 1000 ug via INTRAMUSCULAR

## 2018-05-01 NOTE — Progress Notes (Addendum)
Patient here for monthly B12 injection.  Last injection 04/03/18.  Patient tolerated injection well.  Varney Daily, CMA

## 2018-05-06 ENCOUNTER — Ambulatory Visit (HOSPITAL_COMMUNITY)
Admission: RE | Admit: 2018-05-06 | Discharge: 2018-05-06 | Disposition: A | Discharge status: Home / Self Care | Payer: Medicare Other | Source: Ambulatory Visit | Attending: Cardiology | Admitting: Cardiology

## 2018-05-06 DIAGNOSIS — I739 Peripheral vascular disease, unspecified: Secondary | ICD-10-CM | POA: Diagnosis not present

## 2018-05-06 DIAGNOSIS — I10 Essential (primary) hypertension: Secondary | ICD-10-CM | POA: Diagnosis not present

## 2018-05-06 DIAGNOSIS — I6523 Occlusion and stenosis of bilateral carotid arteries: Secondary | ICD-10-CM | POA: Diagnosis not present

## 2018-05-06 DIAGNOSIS — I2510 Atherosclerotic heart disease of native coronary artery without angina pectoris: Secondary | ICD-10-CM | POA: Insufficient documentation

## 2018-05-06 DIAGNOSIS — I779 Disorder of arteries and arterioles, unspecified: Secondary | ICD-10-CM | POA: Insufficient documentation

## 2018-05-06 DIAGNOSIS — F17200 Nicotine dependence, unspecified, uncomplicated: Secondary | ICD-10-CM | POA: Diagnosis not present

## 2018-05-06 DIAGNOSIS — E785 Hyperlipidemia, unspecified: Secondary | ICD-10-CM | POA: Insufficient documentation

## 2018-05-08 ENCOUNTER — Other Ambulatory Visit: Payer: Self-pay | Admitting: *Deleted

## 2018-05-08 DIAGNOSIS — I779 Disorder of arteries and arterioles, unspecified: Secondary | ICD-10-CM

## 2018-05-08 DIAGNOSIS — I739 Peripheral vascular disease, unspecified: Secondary | ICD-10-CM

## 2018-05-29 ENCOUNTER — Ambulatory Visit (INDEPENDENT_AMBULATORY_CARE_PROVIDER_SITE_OTHER): Payer: Medicare Other | Admitting: Family Medicine

## 2018-05-29 DIAGNOSIS — E538 Deficiency of other specified B group vitamins: Secondary | ICD-10-CM

## 2018-05-29 MED ORDER — CYANOCOBALAMIN 1000 MCG/ML IJ SOLN
1000.0000 ug | Freq: Once | INTRAMUSCULAR | Status: AC
  Administered 2018-05-29: 1000 ug via INTRAMUSCULAR

## 2018-05-29 NOTE — Progress Notes (Signed)
Per orders of Dr. Fry, injection of Vitamin B 12 given by NIMMONS, SYLVIA ANN. Patient tolerated injection well. 

## 2018-06-05 DIAGNOSIS — C44329 Squamous cell carcinoma of skin of other parts of face: Secondary | ICD-10-CM | POA: Diagnosis not present

## 2018-06-05 DIAGNOSIS — L570 Actinic keratosis: Secondary | ICD-10-CM | POA: Diagnosis not present

## 2018-06-05 DIAGNOSIS — L814 Other melanin hyperpigmentation: Secondary | ICD-10-CM | POA: Diagnosis not present

## 2018-06-05 DIAGNOSIS — D485 Neoplasm of uncertain behavior of skin: Secondary | ICD-10-CM | POA: Diagnosis not present

## 2018-06-05 DIAGNOSIS — C44622 Squamous cell carcinoma of skin of right upper limb, including shoulder: Secondary | ICD-10-CM | POA: Diagnosis not present

## 2018-06-05 DIAGNOSIS — C44311 Basal cell carcinoma of skin of nose: Secondary | ICD-10-CM | POA: Diagnosis not present

## 2018-07-03 ENCOUNTER — Ambulatory Visit (INDEPENDENT_AMBULATORY_CARE_PROVIDER_SITE_OTHER): Payer: Medicare Other | Admitting: Family Medicine

## 2018-07-03 DIAGNOSIS — E538 Deficiency of other specified B group vitamins: Secondary | ICD-10-CM

## 2018-07-03 MED ORDER — CYANOCOBALAMIN 1000 MCG/ML IJ SOLN
1000.0000 ug | Freq: Once | INTRAMUSCULAR | Status: AC
  Administered 2018-07-03: 1000 ug via INTRAMUSCULAR

## 2018-07-03 NOTE — Progress Notes (Signed)
Per orders of Dr. Fry, injection of Vitamin B 12 given by Clete Kuch ANN. Patient tolerated injection well. 

## 2018-07-09 ENCOUNTER — Other Ambulatory Visit: Payer: Self-pay | Admitting: Cardiovascular Disease

## 2018-07-09 DIAGNOSIS — I25810 Atherosclerosis of coronary artery bypass graft(s) without angina pectoris: Secondary | ICD-10-CM

## 2018-07-31 ENCOUNTER — Ambulatory Visit (INDEPENDENT_AMBULATORY_CARE_PROVIDER_SITE_OTHER): Payer: Medicare Other

## 2018-07-31 DIAGNOSIS — E538 Deficiency of other specified B group vitamins: Secondary | ICD-10-CM | POA: Diagnosis not present

## 2018-07-31 NOTE — Progress Notes (Signed)
Per orders of Dr. Fry, injection of B12 given by Kendra R Johnson. Patient tolerated injection well.  

## 2018-08-06 DIAGNOSIS — C4442 Squamous cell carcinoma of skin of scalp and neck: Secondary | ICD-10-CM | POA: Diagnosis not present

## 2018-08-06 DIAGNOSIS — D0461 Carcinoma in situ of skin of right upper limb, including shoulder: Secondary | ICD-10-CM | POA: Diagnosis not present

## 2018-08-06 DIAGNOSIS — C44329 Squamous cell carcinoma of skin of other parts of face: Secondary | ICD-10-CM | POA: Diagnosis not present

## 2018-08-12 ENCOUNTER — Other Ambulatory Visit: Payer: Self-pay | Admitting: Cardiovascular Disease

## 2018-08-20 DIAGNOSIS — C44311 Basal cell carcinoma of skin of nose: Secondary | ICD-10-CM | POA: Diagnosis not present

## 2018-08-24 ENCOUNTER — Other Ambulatory Visit: Payer: Self-pay | Admitting: Cardiology

## 2018-08-28 ENCOUNTER — Ambulatory Visit (INDEPENDENT_AMBULATORY_CARE_PROVIDER_SITE_OTHER): Payer: Medicare Other | Admitting: *Deleted

## 2018-08-28 DIAGNOSIS — Z23 Encounter for immunization: Secondary | ICD-10-CM | POA: Diagnosis not present

## 2018-08-28 DIAGNOSIS — E538 Deficiency of other specified B group vitamins: Secondary | ICD-10-CM

## 2018-08-28 MED ORDER — CYANOCOBALAMIN 1000 MCG/ML IJ SOLN
1000.0000 ug | Freq: Once | INTRAMUSCULAR | Status: AC
  Administered 2018-07-31: 1000 ug via INTRAMUSCULAR

## 2018-08-28 NOTE — Progress Notes (Signed)
Per orders of Janna Arch as Dr Sarajane Jews is out of the office, injection of B12 given by Agnes Lawrence. Patient tolerated injection well.

## 2018-09-07 ENCOUNTER — Other Ambulatory Visit: Payer: Self-pay | Admitting: Family Medicine

## 2018-09-18 ENCOUNTER — Other Ambulatory Visit: Payer: Self-pay | Admitting: Cardiology

## 2018-09-28 NOTE — Telephone Encounter (Signed)
Pt following up this Rx metoprolol tartrate (LOPRESSOR) 50 MG tablet  Pharmacy contacted office on 11/05, and this has not been sent as of yet. Pt is out and needs asap.   [id number] - [place], [place] [phone] (Phone) [phone] (Fax)

## 2018-09-30 ENCOUNTER — Telehealth: Payer: Self-pay | Admitting: Family Medicine

## 2018-09-30 ENCOUNTER — Ambulatory Visit (INDEPENDENT_AMBULATORY_CARE_PROVIDER_SITE_OTHER): Payer: Medicare Other

## 2018-09-30 DIAGNOSIS — E538 Deficiency of other specified B group vitamins: Secondary | ICD-10-CM | POA: Diagnosis not present

## 2018-09-30 MED ORDER — CYANOCOBALAMIN 1000 MCG/ML IJ SOLN
1000.0000 ug | Freq: Once | INTRAMUSCULAR | Status: AC
  Administered 2018-09-30: 1000 ug via INTRAMUSCULAR

## 2018-09-30 NOTE — Progress Notes (Signed)
Per orders of Dr. Sarajane Jews, injection of B12 given by Franco Collet. Patient tolerated injection well.

## 2018-09-30 NOTE — Telephone Encounter (Signed)
I have called the pharmacy and they have this rx and it is ready to be picked up.  I have called the pts home number and lmom  To make him aware that this is at the pharmacy and ready to be picked up.

## 2018-09-30 NOTE — Telephone Encounter (Signed)
Patient states [facility] and [facility] to [facility] prescription for Metaproprolol several times with no response.  Patient has been out of medication since November 19th.  He is requesting a call for this to be resolved.

## 2018-10-03 ENCOUNTER — Other Ambulatory Visit: Payer: Self-pay | Admitting: Cardiovascular Disease

## 2018-10-03 DIAGNOSIS — I25810 Atherosclerosis of coronary artery bypass graft(s) without angina pectoris: Secondary | ICD-10-CM

## 2018-10-14 ENCOUNTER — Other Ambulatory Visit: Payer: Self-pay | Admitting: Cardiovascular Disease

## 2018-10-14 NOTE — Telephone Encounter (Signed)
Rx request sent to pharmacy.  

## 2018-10-30 ENCOUNTER — Ambulatory Visit (INDEPENDENT_AMBULATORY_CARE_PROVIDER_SITE_OTHER): Payer: Medicare Other | Admitting: *Deleted

## 2018-10-30 DIAGNOSIS — E538 Deficiency of other specified B group vitamins: Secondary | ICD-10-CM | POA: Diagnosis not present

## 2018-10-30 MED ORDER — CYANOCOBALAMIN 1000 MCG/ML IJ SOLN
1000.0000 ug | Freq: Once | INTRAMUSCULAR | Status: AC
  Administered 2018-10-30: 1000 ug via INTRAMUSCULAR

## 2018-11-11 ENCOUNTER — Other Ambulatory Visit: Payer: Self-pay | Admitting: Cardiovascular Disease

## 2018-11-12 NOTE — Telephone Encounter (Signed)
Contacted patient and scheduled follow up appt with Dr [name].

## 2018-12-04 ENCOUNTER — Ambulatory Visit (INDEPENDENT_AMBULATORY_CARE_PROVIDER_SITE_OTHER): Payer: Medicare Other | Admitting: *Deleted

## 2018-12-04 DIAGNOSIS — E538 Deficiency of other specified B group vitamins: Secondary | ICD-10-CM

## 2018-12-04 MED ORDER — CYANOCOBALAMIN 1000 MCG/ML IJ SOLN
1000.0000 ug | Freq: Once | INTRAMUSCULAR | Status: AC
  Administered 2018-12-04: 1000 ug via INTRAMUSCULAR

## 2018-12-04 NOTE — Progress Notes (Signed)
Per orders of Dr. Fry, injection of Cyanocobalamin 1000mcg given by Jhoselin Crume A. Patient tolerated injection well.  

## 2018-12-09 ENCOUNTER — Ambulatory Visit (INDEPENDENT_AMBULATORY_CARE_PROVIDER_SITE_OTHER): Payer: Medicare Other | Admitting: Cardiovascular Disease

## 2018-12-09 ENCOUNTER — Encounter: Payer: Self-pay | Admitting: Cardiovascular Disease

## 2018-12-09 VITALS — BP 144/50 | HR 52 | Ht 66.5 in | Wt 128.2 lb

## 2018-12-09 DIAGNOSIS — E782 Mixed hyperlipidemia: Secondary | ICD-10-CM | POA: Diagnosis not present

## 2018-12-09 DIAGNOSIS — I2510 Atherosclerotic heart disease of native coronary artery without angina pectoris: Secondary | ICD-10-CM

## 2018-12-09 DIAGNOSIS — F17200 Nicotine dependence, unspecified, uncomplicated: Secondary | ICD-10-CM | POA: Diagnosis not present

## 2018-12-09 DIAGNOSIS — I6523 Occlusion and stenosis of bilateral carotid arteries: Secondary | ICD-10-CM

## 2018-12-09 DIAGNOSIS — I10 Essential (primary) hypertension: Secondary | ICD-10-CM | POA: Diagnosis not present

## 2018-12-09 DIAGNOSIS — I739 Peripheral vascular disease, unspecified: Secondary | ICD-10-CM

## 2018-12-09 NOTE — Assessment & Plan Note (Signed)
History of peripheral arterial disease status post left to right femorofemoral crossover grafting by [name] in 2000.  Earl Castillo has had left common iliac artery stenting as well as orbital atherectomy of his left SFA by myself 05/23/2014 with stenting of his proximal left SFA.  Earl Castillo was restudied 06/06/2016 demonstrating an occluded left SFA and ultimately underwent a left femoropopliteal above-the-knee bypass grafting with PTFE by Dr. [name] 08/09/2016 with healing of his wounds.  His Dopplers have remained stable Earl Castillo does get some swelling of his legs which resolved with leg elevation.

## 2018-12-09 NOTE — Assessment & Plan Note (Signed)
History of carotid artery disease status post right carotid endarterectomy with moderate left ICA stenosis which she we have been following by duplex ultrasound.  These have remained stable.

## 2018-12-09 NOTE — Assessment & Plan Note (Signed)
History of essential hypertension her blood pressure measured today at 144/50.  He is on losartan, hydrochlorothiazide, Procardia and metoprolol.

## 2018-12-09 NOTE — Patient Instructions (Signed)
Medication Instructions:  NONE If you need a refill on your cardiac medications before your next appointment, please call your pharmacy.   Lab work: NONE If you have labs (blood work) drawn today and your tests are completely normal, you will receive your results only by: Marland Kitchen MyChart Message (if you have MyChart) OR . A paper copy in the mail If you have any lab test that is abnormal or we need to change your treatment, we will call you to review the results.  Testing/Procedures: Your physician has requested that you have a carotid duplex. This test is an ultrasound of the carotid arteries in your neck. It looks at blood flow through these arteries that supply the brain with blood. Allow one hour for this exam. There are no restrictions or special instructions.   Your physician has requested that you have a lower or upper extremity arterial duplex. This test is an ultrasound of the arteries in the legs or arms. It looks at arterial blood flow in the legs and arms. Allow one hour for Lower and Upper Arterial scans. There are no restrictions or special instructions  Your physician has requested that you have an ankle brachial index (ABI). During this test an ultrasound and blood pressure cuff are used to evaluate the arteries that supply the arms and legs with blood. Allow thirty minutes for this exam. There are no restrictions or special instructions.    Follow-Up: At [facility], you and your health needs are our priority.  As part of our continuing mission to provide you with exceptional heart care, we have created designated Provider Care Teams.  These Care Teams include your primary Cardiologist (physician) and Advanced Practice Providers (APPs -  Physician Assistants and Nurse Practitioners) who all work together to provide you with the care you need, when you need it. . You will need a follow up appointment in 12 months.  Please call our office 2 months in advance to schedule this  appointment.  You may see [name] or one of the following Advanced Practice Providers on your designated Care Team:   . Kerin Ransom, [place] . Almyra Deforest, PA-C . Fabian Sharp, PA-C . Jory Sims, DNP . Rosaria Ferries, PA-C . Roby Lofts, PA-C . Sande Rives, PA-C

## 2018-12-09 NOTE — Assessment & Plan Note (Signed)
History of discontinue tobacco abuse of a third of a pack a day recalcitrant risk factor modification.

## 2018-12-09 NOTE — Assessment & Plan Note (Signed)
History of hyperlipidemia on statin therapy with lipid profile performed 08/22/2017 revealing total cholesterol 108, LDL 44 and HDL of 55

## 2018-12-09 NOTE — Assessment & Plan Note (Signed)
History of CAD status post coronary artery bypass grafting in 2004 by [name] with a LIMA to his LAD, RIMA to the circumflex marginal branch and vein graft to the distal RCA.  He denies chest pain or shortness of breath.

## 2018-12-09 NOTE — Progress Notes (Signed)
12/09/2018 Earl Castillo   [birth year]  [id number]  Primary Physician [name], MD Primary Cardiologist: [name] MD Garret Reddish, [place], [place]  HPI:  Earl Castillo is a 78 y.o.  previously followed by [name] and now followed by myself. I last saw him in the office  09/02/2017. Since I saw him in the office his wife of 50 years unfortunately passed away 06/12/17. He is living with his youngest son currently. The last time I saw him was at the time of his intervention in July. His past medical history is significant for CAD, status post coronary artery bypass grafting in 2004 by [name], with a LIMA to his LAD, RIMA to the circumflex marginal and SVG to the distal RCA. He had a Myoview stress test performed 08/23/2012 which was low risk and nonischemic. He also has hypertension and hyperlipidemia. He has a 60+ year history of tobacco use and continues to smoke daily. He has significant peripheral vascular disease. He is status post right carotid endarterectomy performed by [name] in 2006. We have been following his carotid disease yearly by duplex ultrasound. He has also had a left to right fem-fem crossover graft by [name] in May of 2000. He was recently seen by [name] and complained of worsening claudication. Recent lower extremity arterial Doppler studies demonstrated progression of disease in his left common and external iliac arteries. In addition, it was suggested that he had moderately severe distal left SFA disease. Subsequently, on 03/17/2014 he underwent a diagnostic PV angiogram performed by [name]. His fem-fem crossover graft was widely patent. His left iliac also appeared widely patent, suggesting falsely elevated velocities by duplex. He did have a high grade focal calcified mid to distal left SFA stenosis corresponding to an area of abnormality and high velocity in the duplex ultrasound with three-vessel runoff. I performed diamondback orbital  rotational atherectomy of his mid left SFA through his femorofemoral crossover graft 05/23/14 with stenting of his proximal left SFA as well. His subsequent Dopplers revealed an increase in his left ABI from 0.8-1.0 . His claudication has resolved. Since I saw him in the office 03/07/16 he denies chest pain. He did have fairly new onset left lower extremity claudication over the last several months with recent Dopplers that showed decline in his left ABI down to 0.4 with an occluded left SFA. I performed angiography on him 06/06/16 revealing a widely patent left iliac system, left to right femorofemoral crossover graft with an occluded left SFA at the origin reconstituting in the adductor canal by profunda femoris collaterals with three-vessel runoff. He also underwent elective left above-the-knee femoropopliteal bypass grafting with a 6 dominant external ring PTFE graft by Dr. [name] 08/09/16. His wounds are healing well. His claudication is improved. He did have some swelling of his lower extremity which she is treating by leg elevation. Since I saw him a year ago he is remained stable.  He denies chest pain or shortness of breath.  Does continue to smoke a third of a pack a day.  He denies claudication.  His carotid and lower extremity Dopplers have remained stable with a patent left femoropopliteal bypass graft.    Current Meds  Medication Sig  . acetaminophen (TYLENOL) 500 MG tablet Take 1,000 mg by mouth daily as needed (for leg pain).   Marland Kitchen amoxicillin-clavulanate (AUGMENTIN) 875-125 MG tablet Take 1 tablet by mouth 2 (two) times daily.  Marland Kitchen aspirin EC 81  MG tablet Take 81 mg by mouth every evening.   . clopidogrel (PLAVIX) 75 MG tablet TAKE 1 TABLET BY MOUTH EVERY DAY  . cyanocobalamin (,VITAMIN B-12,) 1000 MCG/ML injection Inject 1,000 mcg into the muscle every 30 (thirty) days.   Marland Kitchen docusate sodium (COLACE) 100 MG capsule Take 100 mg by mouth daily as needed (for constipation).   . ferrous gluconate  (FERGON) 324 MG tablet TAKE 1 TABLET BY MOUTH EVERY DAY WITH BREAKFAST  . isosorbide mononitrate (IMDUR) 30 MG 24 hr tablet TAKE 1 TABLET (30 MG TOTAL) BY MOUTH DAILY. NEED OV.  Marland Kitchen losartan-hydrochlorothiazide (HYZAAR) 100-25 MG tablet TAKE 1 TABLET BY MOUTH DAILY.  . metoprolol tartrate (LOPRESSOR) 50 MG tablet TAKE 1 TABLET BY MOUTH EVERY DAY  . NIFEdipine (PROCARDIA XL/ADALAT-CC) 90 MG 24 hr tablet TAKE 1 TABLET BY MOUTH EVERY DAY  . ranitidine (ZANTAC) 150 MG tablet Take 150 mg by mouth daily as needed for heartburn.  . simvastatin (ZOCOR) 20 MG tablet TAKE 1 TABLET (20 MG TOTAL) BY MOUTH DAILY AT 6 PM. NEED OV.     No Known Allergies  Social History   Socioeconomic History  . Marital status: Married    Spouse name: Not on file  . Number of children: 2  . Years of education: Not on file  . Highest education level: Not on file  Occupational History  . 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 Financial resource strain: Not on file  . Food insecurity:    Worry: Not on file    Inability: Not on file  . Transportation needs:    Medical: Not on file    Non-medical: Not on file  Tobacco Use  . Smoking status: Current Some Day Smoker    Packs/day: 0.25    Years: 58.00    Pack years: 14.50    Types: Cigarettes  . Smokeless tobacco: Never Used  . Tobacco comment: rarely smokes now; does have approx 38 pack hx  of smoking/ does now the LDCT   Substance and Sexual Activity  . Alcohol use: No    Alcohol/week: 0.0 standard drinks  . Drug use: No  . Sexual activity: Not Currently  Lifestyle  . Physical activity:    Days per week: Not on file    Minutes per session: Not on file  . Stress: Not on file  Relationships  . Social connections:    Talks on phone: Not on file    Gets together: Not on file    Attends religious service: Not on file    Active member of club or organization: Not on file    Attends meetings of clubs or organizations: Not on file    Relationship  status: Not on file  . Intimate partner violence:    Fear of current or ex partner: Not on file    Emotionally abused: Not on file    Physically abused: Not on file    Forced sexual activity: Not on file  Other Topics Concern  . Not on file  Social History Narrative   Daily caffeine      Review of Systems: General: negative for chills, fever, night sweats or weight changes.  Cardiovascular: negative for chest pain, dyspnea on exertion, edema, orthopnea, palpitations, paroxysmal nocturnal dyspnea or shortness of breath Dermatological: negative for rash Respiratory: negative for cough or wheezing Urologic: negative for hematuria Abdominal: negative for nausea, vomiting, diarrhea, bright red blood per rectum, melena, or hematemesis Neurologic: negative for visual  changes, syncope, or dizziness All other systems reviewed and are otherwise negative except as noted above.    Blood pressure (!) 144/50, pulse (!) 52, height 5' 6.5" (1.689 m), weight 128 lb 3.2 oz (58.2 kg).  General appearance: alert and no distress Neck: no adenopathy, no JVD, supple, symmetrical, trachea midline, thyroid not enlarged, symmetric, no tenderness/mass/nodules and Bilateral carotid bruits Lungs: clear to auscultation bilaterally Heart: regular rate and rhythm, S1, S2 normal, no murmur, click, rub or gallop Extremities: extremities normal, atraumatic, no cyanosis or edema Pulses: 2+ and symmetric Skin: Skin color, texture, turgor normal. No rashes or lesions Neurologic: Alert and oriented X 3, normal strength and tone. Normal symmetric reflexes. Normal coordination and gait  EKG sinus bradycardia at 52 with voltage criteria for LVH and occasional PVCs.  I personally reviewed this EKG.  ASSESSMENT AND PLAN:   Hyperlipidemia History of hyperlipidemia on statin therapy with lipid profile performed 08/22/2017 revealing total cholesterol 108, LDL 44 and HDL of 55  Coronary atherosclerosis History of CAD  status post coronary artery bypass grafting in 2004 by [name] with a LIMA to his LAD, RIMA to the circumflex marginal branch and vein graft to the distal RCA.  He denies chest pain or shortness of breath.  PVD- Lt SFA PTA/stent 05/23/14 History of peripheral arterial disease status post left to right femorofemoral crossover grafting by [name] in 2000.  He has had left common iliac artery stenting as well as orbital atherectomy of his left SFA by myself 05/23/2014 with stenting of his proximal left SFA.  He was restudied 06/06/2016 demonstrating an occluded left SFA and ultimately underwent a left femoropopliteal above-the-knee bypass grafting with PTFE by Dr. [name] 08/09/2016 with healing of his wounds.  His Dopplers have remained stable he does get some swelling of his legs which resolved with leg elevation.  Current smoker History of discontinue tobacco abuse of a third of a pack a day recalcitrant risk factor modification.  Essential hypertension History of essential hypertension her blood pressure measured today at 144/50.  He is on losartan, hydrochlorothiazide, Procardia and metoprolol.  Carotid artery disease History of carotid artery disease status post right carotid endarterectomy with moderate left ICA stenosis which she we have been following by duplex ultrasound.  These have remained stable.      [name] MD FACP,FACC,FAHA, [facility] 12/09/2018 12:01 PM

## 2018-12-11 ENCOUNTER — Other Ambulatory Visit: Payer: Self-pay | Admitting: Cardiovascular Disease

## 2018-12-17 ENCOUNTER — Ambulatory Visit (INDEPENDENT_AMBULATORY_CARE_PROVIDER_SITE_OTHER): Payer: Medicare Other | Admitting: Family Medicine

## 2018-12-17 ENCOUNTER — Encounter: Payer: Self-pay | Admitting: Family Medicine

## 2018-12-17 VITALS — BP 150/64 | HR 55 | Temp 98.0°F | Ht 65.0 in | Wt 129.1 lb

## 2018-12-17 DIAGNOSIS — J209 Acute bronchitis, unspecified: Secondary | ICD-10-CM

## 2018-12-17 DIAGNOSIS — J0141 Acute recurrent pansinusitis: Secondary | ICD-10-CM | POA: Diagnosis not present

## 2018-12-17 DIAGNOSIS — H9193 Unspecified hearing loss, bilateral: Secondary | ICD-10-CM

## 2018-12-17 DIAGNOSIS — H9312 Tinnitus, left ear: Secondary | ICD-10-CM

## 2018-12-17 DIAGNOSIS — H6123 Impacted cerumen, bilateral: Secondary | ICD-10-CM

## 2018-12-17 DIAGNOSIS — I6523 Occlusion and stenosis of bilateral carotid arteries: Secondary | ICD-10-CM | POA: Diagnosis not present

## 2018-12-17 MED ORDER — AMOXICILLIN-POT CLAVULANATE 875-125 MG PO TABS: 1.0000 | ORAL_TABLET | Freq: Two times a day (BID) | ORAL | 0 refills | Status: DC

## 2018-12-17 NOTE — Progress Notes (Signed)
   Subjective:    Patient ID: Earl Castillo, male    DOB: 05/19/1941, 78 y.o.   MRN: [id number]  HPI Here for several issues. First he has had trouble hearing for years out of both ears, but the left ear is the worst. Now he also has roaring in the left ear for a few weeks. No ear pain or dizziness. Also for te past week he has had chest tightness and coughing up yellow sputum. No fever.    Review of Systems  Constitutional: Negative.   HENT: Positive for congestion, hearing loss and tinnitus. Negative for postnasal drip, sinus pressure and sinus pain.   Eyes: Negative.   Respiratory: Positive for cough and chest tightness.        Objective:   Physical Exam Constitutional:      Appearance: Normal appearance. He is not ill-appearing.  HENT:     Head:     Comments: Both ear canals are full of cerumen     Nose: Nose normal.     Mouth/Throat:     Pharynx: Oropharynx is clear.  Eyes:     Conjunctiva/sclera: Conjunctivae normal.  Cardiovascular:     Rate and Rhythm: Normal rate and regular rhythm.     Pulses: Normal pulses.     Heart sounds: Normal heart sounds.  Pulmonary:     Effort: Pulmonary effort is normal.     Breath sounds: Wheezing and rhonchi present. No rales.  Lymphadenopathy:     Cervical: No cervical adenopathy.           Assessment & Plan:  He has a bronchitis and we will treat this with Augmentin. We will refer him to ENT for the cerumen impactions and to address hearing loss and tinnitus. Alysia Penna, MD

## 2018-12-18 ENCOUNTER — Encounter: Payer: Self-pay | Admitting: Family Medicine

## 2018-12-23 ENCOUNTER — Ambulatory Visit (INDEPENDENT_AMBULATORY_CARE_PROVIDER_SITE_OTHER): Payer: Medicare Other | Admitting: Family Medicine

## 2018-12-23 ENCOUNTER — Encounter: Payer: Self-pay | Admitting: Family Medicine

## 2018-12-23 VITALS — BP 140/70 | HR 51 | Temp 97.9°F | Ht 66.0 in | Wt 127.2 lb

## 2018-12-23 DIAGNOSIS — I6523 Occlusion and stenosis of bilateral carotid arteries: Secondary | ICD-10-CM

## 2018-12-23 DIAGNOSIS — M150 Primary generalized (osteo)arthritis: Secondary | ICD-10-CM

## 2018-12-23 DIAGNOSIS — E538 Deficiency of other specified B group vitamins: Secondary | ICD-10-CM

## 2018-12-23 DIAGNOSIS — D500 Iron deficiency anemia secondary to blood loss (chronic): Secondary | ICD-10-CM | POA: Diagnosis not present

## 2018-12-23 DIAGNOSIS — I10 Essential (primary) hypertension: Secondary | ICD-10-CM | POA: Diagnosis not present

## 2018-12-23 DIAGNOSIS — N138 Other obstructive and reflux uropathy: Secondary | ICD-10-CM | POA: Diagnosis not present

## 2018-12-23 DIAGNOSIS — E782 Mixed hyperlipidemia: Secondary | ICD-10-CM

## 2018-12-23 DIAGNOSIS — Z23 Encounter for immunization: Secondary | ICD-10-CM

## 2018-12-23 DIAGNOSIS — N401 Enlarged prostate with lower urinary tract symptoms: Secondary | ICD-10-CM

## 2018-12-23 DIAGNOSIS — M159 Polyosteoarthritis, unspecified: Secondary | ICD-10-CM

## 2018-12-23 LAB — BASIC METABOLIC PANEL
BUN: 21 mg/dL (ref 6–23)
CO2: 30 mEq/L (ref 19–32)
CREATININE: 1.13 mg/dL (ref 0.40–1.50)
Calcium: 9.2 mg/dL (ref 8.4–10.5)
Chloride: 105 mEq/L (ref 96–112)
GFR: 62.85 mL/min (ref >60.00)
Glucose, Bld: 82 mg/dL (ref 70–99)
Potassium: 3.9 mEq/L (ref 3.5–5.1)
Sodium: 142 mEq/L (ref 135–145)

## 2018-12-23 LAB — CBC WITH DIFFERENTIAL/PLATELET
Basophils Absolute: 0 10*3/uL (ref 0.0–0.1)
Basophils Relative: 0.6 % (ref 0.0–3.0)
Eosinophils Absolute: 0.1 10*3/uL (ref 0.0–0.7)
Eosinophils Relative: 1.1 % (ref 0.0–5.0)
HCT: 40.5 % (ref 39.0–52.0)
Hemoglobin: 13.5 g/dL (ref 13.0–17.0)
Lymphocytes Relative: 24.9 % (ref 12.0–46.0)
Lymphs Abs: 2.2 10*3/uL (ref 0.7–4.0)
MCHC: 33.3 g/dL (ref 30.0–36.0)
MCV: 97.7 fl (ref 78.0–100.0)
Monocytes Absolute: 0.6 10*3/uL (ref 0.1–1.0)
Monocytes Relative: 6.9 % (ref 3.0–12.0)
NEUTROS ABS: 5.8 10*3/uL (ref 1.4–7.7)
Neutrophils Relative %: 66.5 % (ref 43.0–77.0)
Platelets: 148 10*3/uL — ABNORMAL LOW (ref 150.0–400.0)
RBC: 4.15 Mil/uL — ABNORMAL LOW (ref 4.22–5.81)
RDW: 13.5 % (ref 11.5–15.5)
WBC: 8.7 10*3/uL (ref 4.0–10.5)

## 2018-12-23 LAB — HEPATIC FUNCTION PANEL
ALT: 8 U/L (ref 0–53)
AST: 15 U/L (ref 0–37)
Albumin: 3.7 g/dL (ref 3.5–5.2)
Alkaline Phosphatase: 62 U/L (ref 39–117)
Bilirubin, Direct: 0.1 mg/dL (ref 0.0–0.3)
Total Bilirubin: 0.5 mg/dL (ref 0.2–1.2)
Total Protein: 6.8 g/dL (ref 6.0–8.3)

## 2018-12-23 LAB — IBC + FERRITIN
Ferritin: 33.1 ng/mL (ref 22.0–322.0)
Iron: 154 ug/dL (ref 42–165)
Saturation Ratios: 43 % (ref 20.0–50.0)
Transferrin: 256 mg/dL (ref 212.0–360.0)

## 2018-12-23 LAB — LIPID PANEL
Cholesterol: 113 mg/dL (ref 0–200)
HDL: 59 mg/dL (ref >39.00)
LDL Cholesterol: 40 mg/dL (ref 0–99)
NonHDL: 53.97
Total CHOL/HDL Ratio: 2
Triglycerides: 68 mg/dL (ref 0.0–149.0)
VLDL: 13.6 mg/dL (ref 0.0–40.0)

## 2018-12-23 LAB — TSH: TSH: 1.65 u[IU]/mL (ref 0.35–4.50)

## 2018-12-23 LAB — POC URINALSYSI DIPSTICK (AUTOMATED)
BILIRUBIN UA: NEGATIVE
Glucose, UA: NEGATIVE
Ketones, UA: NEGATIVE
Leukocytes, UA: NEGATIVE
Nitrite, UA: NEGATIVE
Protein, UA: POSITIVE — AB
Spec Grav, UA: >=1.03 — AB (ref 1.010–1.025)
Urobilinogen, UA: 0.2 E.U./dL
pH, UA: 6 (ref 5.0–8.0)

## 2018-12-23 LAB — VITAMIN B12: Vitamin B-12: 421 pg/mL (ref 211–911)

## 2018-12-23 LAB — PSA: PSA: 1.26 ng/mL (ref 0.10–4.00)

## 2018-12-23 NOTE — Progress Notes (Signed)
Subjective:    Patient ID: Earl Castillo, male    DOB: August 10, 1941, 78 y.o.   MRN: [id number]  HPI Here to follow up on issues. He was here recently for a bronchitis and he has been taking Augmentin for this. He feels much better now and the coughing has stopped. Otherwise his BP is stable. He gets B12 shots monthly. He is scheduled for arterial dopplers of the legs and the carotids in a few weeks per [name]. He is past due for a colonoscopy. He has iron deficiency anemia partly from B12 deficiency and partly from GI losses. He was found to have small bowel angiodysplasia by capsule endoscopy in 2014.    Review of Systems  Constitutional: Negative.   HENT: Negative.   Eyes: Negative.   Respiratory: Negative.   Cardiovascular: Negative.   Gastrointestinal: Negative.   Genitourinary: Negative.   Musculoskeletal: Negative.   Skin: Negative.   Neurological: Negative.   Psychiatric/Behavioral: Negative.        Objective:   Physical Exam Constitutional:      General: He is not in acute distress.    Appearance: He is well-developed. He is not diaphoretic.  HENT:     Head: Normocephalic and atraumatic.     Right Ear: External ear normal.     Left Ear: External ear normal.     Nose: Nose normal.     Mouth/Throat:     Pharynx: No oropharyngeal exudate.  Eyes:     General: No scleral icterus.       Right eye: No discharge.        Left eye: No discharge.     Conjunctiva/sclera: Conjunctivae normal.     Pupils: Pupils are equal, round, and reactive to light.  Neck:     Musculoskeletal: Neck supple.     Thyroid: No thyromegaly.     Vascular: No JVD.     Trachea: No tracheal deviation.  Cardiovascular:     Rate and Rhythm: Normal rate and regular rhythm.     Heart sounds: Normal heart sounds. No murmur. No friction rub. No gallop.   Pulmonary:     Effort: Pulmonary effort is normal. No respiratory distress.     Breath sounds: Normal breath sounds. No wheezing or rales.    Chest:     Chest wall: No tenderness.  Abdominal:     General: Bowel sounds are normal. There is no distension.     Palpations: Abdomen is soft. There is no mass.     Tenderness: There is no abdominal tenderness. There is no guarding or rebound.  Genitourinary:    Penis: Normal. No tenderness.      Prostate: Normal.     Rectum: Normal. Guaiac result negative.  Musculoskeletal: Normal range of motion.        General: No tenderness.  Lymphadenopathy:     Cervical: No cervical adenopathy.  Skin:    General: Skin is warm and dry.     Coloration: Skin is not pale.     Findings: No erythema or rash.  Neurological:     Mental Status: He is alert and oriented to person, place, and time.     Cranial Nerves: No cranial nerve deficit.     Motor: No abnormal muscle tone.     Coordination: Coordination normal.     Deep Tendon Reflexes: Reflexes are normal and symmetric. Reflexes normal.  Psychiatric:        Behavior: Behavior normal.  Thought Content: Thought content normal.        Judgment: Judgment normal.           Assessment & Plan:  He is recovering from a recent bronchitis. His HTN and GERD are stable. We will get fasting labs to check lipids, B12, and iron stores. We will contact [name] to set up another cololoscopy. He is given a TDaP as well as a rx to get the Shingrix vaccine at his pharmacy. Alysia Penna, MD

## 2018-12-30 ENCOUNTER — Encounter (HOSPITAL_COMMUNITY): Payer: Medicare Other

## 2018-12-30 ENCOUNTER — Ambulatory Visit (HOSPITAL_COMMUNITY)
Admission: RE | Admit: 2018-12-30 | Discharge: 2018-12-30 | Disposition: A | Discharge status: Home / Self Care | Payer: Medicare Other | Source: Ambulatory Visit | Attending: Cardiovascular Disease | Admitting: Cardiovascular Disease

## 2018-12-30 ENCOUNTER — Ambulatory Visit (HOSPITAL_COMMUNITY)
Admission: RE | Admit: 2018-12-30 | Discharge: 2018-12-30 | Disposition: A | Discharge status: Home / Self Care | Payer: Medicare Other | Source: Ambulatory Visit | Attending: Cardiology | Admitting: Cardiology

## 2018-12-30 DIAGNOSIS — I739 Peripheral vascular disease, unspecified: Secondary | ICD-10-CM

## 2018-12-30 DIAGNOSIS — I6523 Occlusion and stenosis of bilateral carotid arteries: Secondary | ICD-10-CM

## 2018-12-31 ENCOUNTER — Other Ambulatory Visit: Payer: Self-pay | Admitting: Cardiovascular Disease

## 2018-12-31 DIAGNOSIS — I25810 Atherosclerosis of coronary artery bypass graft(s) without angina pectoris: Secondary | ICD-10-CM

## 2018-12-31 NOTE — Telephone Encounter (Signed)
Rx(s) sent to pharmacy electronically.  

## 2019-01-01 ENCOUNTER — Ambulatory Visit (INDEPENDENT_AMBULATORY_CARE_PROVIDER_SITE_OTHER): Payer: Medicare Other | Admitting: *Deleted

## 2019-01-01 DIAGNOSIS — E538 Deficiency of other specified B group vitamins: Secondary | ICD-10-CM

## 2019-01-01 MED ORDER — CYANOCOBALAMIN 1000 MCG/ML IJ SOLN
1000.0000 ug | Freq: Once | INTRAMUSCULAR | Status: AC
  Administered 2019-01-01: 1000 ug via INTRAMUSCULAR

## 2019-01-01 NOTE — Progress Notes (Signed)
Per orders of Dr. Fry, injection of Vit B12 given by Rai Sinagra M. Patient tolerated injection well.  

## 2019-01-04 ENCOUNTER — Other Ambulatory Visit: Payer: Self-pay | Admitting: Internal Medicine

## 2019-01-04 ENCOUNTER — Other Ambulatory Visit: Payer: Self-pay | Admitting: Family Medicine

## 2019-01-05 DIAGNOSIS — H9193 Unspecified hearing loss, bilateral: Secondary | ICD-10-CM | POA: Diagnosis not present

## 2019-01-05 DIAGNOSIS — L299 Pruritus, unspecified: Secondary | ICD-10-CM | POA: Diagnosis not present

## 2019-01-05 DIAGNOSIS — H608X3 Other otitis externa, bilateral: Secondary | ICD-10-CM | POA: Diagnosis not present

## 2019-01-15 ENCOUNTER — Other Ambulatory Visit: Payer: Self-pay | Admitting: Family Medicine

## 2019-01-19 DIAGNOSIS — H608X3 Other otitis externa, bilateral: Secondary | ICD-10-CM | POA: Diagnosis not present

## 2019-01-21 ENCOUNTER — Other Ambulatory Visit: Payer: Self-pay | Admitting: Internal Medicine

## 2019-01-21 NOTE — Telephone Encounter (Signed)
Patient had labs in January Sir, please advise if need to refill?

## 2019-01-22 NOTE — Telephone Encounter (Signed)
Refill x 1 year ok

## 2019-01-27 ENCOUNTER — Telehealth: Payer: Self-pay | Admitting: Family Medicine

## 2019-01-27 NOTE — Telephone Encounter (Signed)
I have called the pt and he is aware to not come in for the b12 injections at this time but to take b12 SL 1000 mg daily.  He will come back in in 90 days to have labs redone.  Pt is aware

## 2019-01-29 ENCOUNTER — Ambulatory Visit: Payer: Medicare Other

## 2019-02-06 ENCOUNTER — Other Ambulatory Visit: Payer: Self-pay | Admitting: Cardiovascular Disease

## 2019-02-08 NOTE — Telephone Encounter (Signed)
Clopidogrel refilled

## 2019-03-03 ENCOUNTER — Other Ambulatory Visit: Payer: Self-pay | Admitting: Cardiovascular Disease

## 2019-05-28 ENCOUNTER — Encounter (HOSPITAL_COMMUNITY): Payer: Medicare Other

## 2019-06-01 ENCOUNTER — Ambulatory Visit (HOSPITAL_BASED_OUTPATIENT_CLINIC_OR_DEPARTMENT_OTHER)
Admission: RE | Admit: 2019-06-01 | Discharge: 2019-06-01 | Disposition: A | Discharge status: Home / Self Care | Payer: Medicare Other | Source: Ambulatory Visit | Attending: Cardiology | Admitting: Cardiology

## 2019-06-01 ENCOUNTER — Other Ambulatory Visit: Payer: Self-pay

## 2019-06-01 ENCOUNTER — Ambulatory Visit (HOSPITAL_COMMUNITY)
Admission: RE | Admit: 2019-06-01 | Discharge: 2019-06-01 | Disposition: A | Discharge status: Home / Self Care | Payer: Medicare Other | Source: Ambulatory Visit | Attending: Cardiology | Admitting: Cardiology

## 2019-06-01 DIAGNOSIS — I6523 Occlusion and stenosis of bilateral carotid arteries: Secondary | ICD-10-CM

## 2019-06-01 DIAGNOSIS — I739 Peripheral vascular disease, unspecified: Secondary | ICD-10-CM | POA: Diagnosis not present

## 2019-06-09 ENCOUNTER — Other Ambulatory Visit: Payer: Self-pay

## 2019-06-09 DIAGNOSIS — I739 Peripheral vascular disease, unspecified: Secondary | ICD-10-CM

## 2019-06-09 DIAGNOSIS — I6523 Occlusion and stenosis of bilateral carotid arteries: Secondary | ICD-10-CM

## 2019-08-20 ENCOUNTER — Other Ambulatory Visit: Payer: Self-pay | Admitting: Cardiovascular Disease

## 2019-11-17 ENCOUNTER — Telehealth: Payer: Self-pay | Admitting: *Deleted

## 2019-11-17 NOTE — Telephone Encounter (Signed)
Yes he should get the Covid vaccine

## 2019-11-17 NOTE — Telephone Encounter (Signed)
Copied from [place] [id number]. Topic: General - Inquiry >> Nov 17, 2019 10:58 AM Mathis Bud wrote: Reason for CRM: Vaccine clinic at [facility] called requesting verbal from PCP to see if he can get the vaccine, Patient is at [facility] now.  Patient is on blood thinners and is making sure he can receive the vaccine. Transfer to office.

## 2019-11-17 NOTE — Telephone Encounter (Signed)
Spoke with nurse at vaccine clinic at [facility] and informed her that it was okay for patient to get vaccine per Dr. Sarajane Jews.

## 2019-12-03 ENCOUNTER — Other Ambulatory Visit: Payer: Self-pay | Admitting: Cardiovascular Disease

## 2019-12-05 ENCOUNTER — Other Ambulatory Visit: Payer: Self-pay | Admitting: Family Medicine

## 2019-12-23 ENCOUNTER — Telehealth (INDEPENDENT_AMBULATORY_CARE_PROVIDER_SITE_OTHER): Payer: Medicare Other | Admitting: Cardiovascular Disease

## 2019-12-23 VITALS — Ht 66.5 in | Wt 133.0 lb

## 2019-12-23 DIAGNOSIS — I6523 Occlusion and stenosis of bilateral carotid arteries: Secondary | ICD-10-CM

## 2019-12-23 DIAGNOSIS — E782 Mixed hyperlipidemia: Secondary | ICD-10-CM | POA: Diagnosis not present

## 2019-12-23 DIAGNOSIS — I739 Peripheral vascular disease, unspecified: Secondary | ICD-10-CM

## 2019-12-23 DIAGNOSIS — I2510 Atherosclerotic heart disease of native coronary artery without angina pectoris: Secondary | ICD-10-CM

## 2019-12-23 DIAGNOSIS — F17200 Nicotine dependence, unspecified, uncomplicated: Secondary | ICD-10-CM | POA: Diagnosis not present

## 2019-12-23 DIAGNOSIS — I10 Essential (primary) hypertension: Secondary | ICD-10-CM | POA: Diagnosis not present

## 2019-12-23 NOTE — Progress Notes (Signed)
Virtual Visit via Telephone Note   This visit type was conducted due to national recommendations for restrictions regarding the COVID-19 Pandemic (e.g. social distancing) in an effort to limit this patient's exposure and mitigate transmission in our community.  Due to his co-morbid illnesses, this patient is at least at moderate risk for complications without adequate follow up.  This format is felt to be most appropriate for this patient at this time.  The patient did not have access to video technology/had technical difficulties with video requiring transitioning to audio format only (telephone).  All issues noted in this document were discussed and addressed.  No physical exam could be performed with this format.  Please refer to the patient's chart for his  consent to telehealth for [facility].   Date:  12/23/2019   ID:  Earl Castillo, DOB 1941-03-18, MRN [id number]  Patient Location: Home Provider Location: Home  PCP:  [name], MD  Cardiologist: Dr. Quay Burow Electrophysiologist:  None   Evaluation Performed:  Follow-Up Visit  Chief Complaint: CAD and PAD  History of Present Illness:    Earl Castillo is a 79 y.o. previously followed by [name] and now followed by myself. I last saw him in the office  12/09/2018.His wife of 25 years unfortunately passed away 06-16-17. He is living with his youngest son currently.The last time I saw him was at the time of his intervention in July. His past medical history is significant for CAD, status post coronary artery bypass grafting in 2004 by [name], with a LIMA to his LAD, RIMA to the circumflex marginal and SVG to the distal RCA. He had a Myoview stress test performed 08/23/2012 which was low risk and nonischemic. He also has hypertension and hyperlipidemia. He has a 60+ year history of tobacco use and continues to smoke daily. He has significant peripheral vascular disease. He is status post right carotid endarterectomy  performed by [name] in 2006. We have been following his carotid disease yearly by duplex ultrasound. He has also had a left to right fem-fem crossover graft by [name] in May of 2000. He was recently seen by [name] and complained of worsening claudication. Recent lower extremity arterial Doppler studies demonstrated progression of disease in his left common and external iliac arteries. In addition, it was suggested that he had moderately severe distal left SFA disease. Subsequently, on 03/17/2014 he underwent a diagnostic PV angiogram performed by [name]. His fem-fem crossover graft was widely patent. His left iliac also appeared widely patent, suggesting falsely elevated velocities by duplex. He did have a high grade focal calcified mid to distal left SFA stenosis corresponding to an area of abnormality and high velocity in the duplex ultrasound with three-vessel runoff. I performed diamondback orbital rotational atherectomy of his mid left SFA through his femorofemoral crossover graft 05/23/14 with stenting of his proximal left SFA as well. His subsequent Dopplers revealed an increase in his left ABI from 0.8-1.0 . His claudication has resolved. Since I saw him in the office 03/07/16 he denies chest pain. He did have fairly new onset left lower extremity claudication over the last several months with recent Dopplers that showed decline in his left ABI down to 0.4 with an occluded left SFA. I performed angiography on him 06/06/16 revealing a widely patent left iliac system, left to right femorofemoral crossover graft with an occluded left SFA at the origin reconstituting in the adductor canal by profunda femoris collaterals  with three-vessel runoff. He also underwent elective left above-the-knee femoropopliteal bypass grafting with a 6 dominant external ring PTFE graft by Dr. [name] 08/09/16. His wounds are healing well. His claudication is improved. He did have some swelling of his lower extremity  which she is treating by leg elevation.  Since I saw him in the office a year ago he is remained stable.  He denies chest pain or shortness of breath.  He does complain of some weakness in his left lower extremity.  Dopplers performed 06/01/2019 revealed a patent left femoropopliteal bypass graft.  Carotid Dopplers revealed a patent endarterectomy site with moderate left ICA stenosis.  He does continue to smoke a third of a pack a day..   The patient does not have symptoms concerning for COVID-19 infection (fever, chills, cough, or new shortness of breath).    Past Medical History:  Diagnosis Date  . Anemia   . Angiodysplasia of intestine - small bowel  09/21/2013  . Blood transfusion abn reaction or complication, no procedure mishap    "HgB dropped real low"  . Cancer [facility])    Skin cancers  . Carotid artery disease ([place])    status post carotid endarterectomy by [name]  . Chicken pox   . Colon polyps 08/2012   Hyperplastic and adenomatous sessile polyps  . Coronary artery disease    CABG'04, Low risk Myoview 2013  . GERD (gastroesophageal reflux disease)   . Hemorrhoid   . History of blood transfusion 08/2013   "HgB dropped real low; related to bleeding from my rectum"  . History of kidney stones    1 removed surgical, passed 2  . Hyperlipidemia   . Hypertension   . 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 Lower GI bleeding 08/2013  . Osteoarthritis    "hips" (05/23/2014)  . Peripheral arterial disease ([place])    status post left to right fem-fem crossover graft in by [name]  . Pneumonia    - "years ago"  . Sinus headache    "alot" (05/23/2014)  . Skin exam, screening for cancer    sees [name]   . Vitamin B12 deficiency 11/23/2013   Past Surgical History:  Procedure Laterality Date  . BAND HEMORRHOIDECTOMY    . BASAL CELL CARCINOMA EXCISION  May 2009   per [name] , left nose   .  basket extraction of ureteral stone  1970's  . Bypass rt leg Right 2000  . capsule endoscopy  10-05-13   per [name], 4 AVM's  . CARDIAC CATHETERIZATION  08/18/2003   Recommend CABG  . CARDIOVASCULAR STRESS TEST - LEXISCAN  08/25/2012   No significant wall motion abnormalities  . CAROTID ANGIOGRAM N/A 03/17/2014   Procedure: CAROTID ANGIOGRAM;  Surgeon: [name], MD;  Location: [facility] CATH LAB;  Service: Cardiovascular;  Laterality: N/A;  right carotid  . CAROTID ENDARTERECTOMY Right   . CAROTID ENDARTERECTOMY Right 03/26/2005   Dr Georgina Quint  . CATARACT EXTRACTION W/ INTRAOCULAR LENS  IMPLANT, BILATERAL Bilateral 2014   per [name]  . [place]   "bulging disc"  . COLONOSCOPY  08-12-12   per [name], tubular adenomas, repeat in 5 yrs   . CORONARY ARTERY BYPASS GRAFT  08/30/2003   x3. LIMA-distal LAD, RIMA-circumflex, SVG-distal RCA  . CYSTOSCOPY W/ STONE MANIPULATION    . ESOPHAGOGASTRODUODENOSCOPY N/A 08/17/2013   Procedure: ESOPHAGOGASTRODUODENOSCOPY (EGD);  Surgeon:  [name], MD;  Location: [facility];  Service: Endoscopy;  Laterality: N/A;  . FEMORAL ARTERY STENT Left 05/23/2014  . FEMORAL-FEMORAL BYPASS GRAFT  2003  . FEMORAL-POPLITEAL BYPASS GRAFT Left 08/09/2016   Procedure: BYPASS GRAFT LEFT FEMORAL-POPLITEAL ARTERY USING GORE 6MM X 80CM PROPATEN GRAFT;  Surgeon: Serafina Mitchell, MD;  Location: [place];  Service: Vascular;  Laterality: Left;  . LOWER EXTREMITY ANGIOGRAM N/A 03/17/2014   Procedure: LOWER EXTREMITY ANGIOGRAM;  Surgeon: [name], MD;  Location: [facility] CATH LAB;  Service: Cardiovascular;  Laterality: N/A;  . PERIPHERAL VASCULAR CATHETERIZATION N/A 06/06/2016   Procedure: Lower Extremity Angiography;  Surgeon: [name], MD;  Location: [place] CV LAB;  Service: Cardiovascular;  Laterality: N/A;  . PERIPHERAL VASCULAR CATHETERIZATION N/A 06/06/2016   Procedure: Abdominal Aortogram;  Surgeon: [name], MD;   Location: [place] CV LAB;  Service: Cardiovascular;  Laterality: N/A;  . RENAL DUPLEX Bilateral 02/02/2013   Bilateral renal arteries demonstrate narrowingconsistent with a 60-99% diameter reduction.  . TRANSTHORACIC ECHOCARDIOGRAM  08/02/2013   EF 55-60%, normal echocardiogram     No outpatient medications have been marked as taking for the 12/23/19 encounter (Appointment) with [name], MD.     Allergies:   Patient has no known allergies.   Social History   Tobacco Use  . Smoking status: Current Some Day Smoker    Packs/day: 0.25    Years: 58.00    Pack years: 14.50    Types: Cigarettes  . Smokeless tobacco: Never Used  . Tobacco comment: rarely smokes now; does have approx 38 pack hx  of smoking/ does now the LDCT   Substance Use Topics  . Alcohol use: No    Alcohol/week: 0.0 standard drinks  . Drug use: No     Family Hx: The patient's family history includes Coronary artery disease in his brother, brother, and sister; Heart disease in his brother, father, sister, and sister. There is no history of Colon cancer.  ROS:   Please see the history of present illness.     All other systems reviewed and are negative.   Prior CV studies:   The following studies were reviewed today:  I reviewed his carotid Dopplers and lower extremity Dopplers performed 06/01/2019.  Labs/Other Tests and Data Reviewed:    EKG:  No ECG reviewed.  Recent Labs: 12/23/2018: ALT 8; BUN 21; Creatinine, Ser 1.13; Hemoglobin 13.5; Platelets 148.0; Potassium 3.9; Sodium 142; TSH 1.65   Recent Lipid Panel Lab Results  Component Value Date/Time   CHOL 113 12/23/2018 11:24 AM   TRIG 68.0 12/23/2018 11:24 AM   HDL 59.00 12/23/2018 11:24 AM   CHOLHDL 2 12/23/2018 11:24 AM   LDLCALC 40 12/23/2018 11:24 AM    Wt Readings from Last 3 Encounters:  12/23/18 127 lb 3.2 oz (57.7 kg)  12/17/18 129 lb 2 oz (58.6 kg)  12/09/18 128 lb 3.2 oz (58.2 kg)     Objective:    Vital Signs:  There  were no vitals taken for this visit.   VITAL SIGNS:  reviewed the patient did not check his vital signs at home today.  A complete physical exam was not performed since this was a virtual telemedicine phone visit  ASSESSMENT & PLAN:    1. Coronary artery disease-history of CABG in 04 by [name].  A LIMA to his LAD, vein to an obtuse marginal branch and to the RCA.  He has had no subsequent procedures, denies chest  pain or shortness of breath. 2. Peripheral arterial disease: History of right to left femorofemoral crossover grafting by [name] in 2000.  I performed intervention on his left SFA 05/23/2014 which ultimately closed.  He subsequently had a left femoropopliteal bypass graft placed by Dr. [name] 08/09/2016 with recent Dopplers performed 06/01/2019 revealing this to be widely patent. 3. Essential hypertension-the patient did not check his vital signs today.  He is on Hyzaar and Procardia 4. Hyperlipidemia-history of hyperlipidemia on simvastatin with lipid profile performed 12/23/2018 revealing total cholesterol of 113, LDL 40 and HDL 59 5. Carotid artery disease-history of remote right carotid endarterectomy in 2006 by Dr. Deon Pilling with carotid Doppler performed 06/01/2019 revealing widely patent endarterectomy site with moderate left ICA stenosis.  COVID-19 Education: The signs and symptoms of COVID-19 were discussed with the patient and how to seek care for testing (follow up with PCP or arrange E-visit).  The importance of social distancing was discussed today.  Time:   Today, I have spent 5 minutes with the patient with telehealth technology discussing the above problems.     Medication Adjustments/Labs and Tests Ordered: Current medicines are reviewed at length with the patient today.  Concerns regarding medicines are outlined above.   Tests Ordered: No orders of the defined types were placed in this encounter.   Medication Changes: No orders of the defined types were placed in  this encounter.   Follow Up:  In Person in 1 year(s)  Signed, Quay Burow, MD  12/23/2019 8:06 AM    [place]

## 2019-12-23 NOTE — Patient Instructions (Signed)
Medication Instructions:  Your physician recommends that you continue on your current medications as directed. Please refer to the Current Medication list given to you today.  If you need a refill on your cardiac medications before your next appointment, please call your pharmacy.   Lab work: NONE  Testing/Procedures: IN [place] Your physician has requested that you have a lower extremity arterial exercise duplex. During this test, exercise and ultrasound are used to evaluate arterial blood flow in the legs. Allow one hour for this exam. There are no restrictions or special instructions.  AND  Your physician has requested that you have a carotid duplex. This test is an ultrasound of the carotid arteries in your neck. It looks at blood flow through these arteries that supply the brain with blood. Allow one hour for this exam. There are no restrictions or special instructions.  Follow-Up: At [facility], you and your health needs are our priority.  As part of our continuing mission to provide you with exceptional heart care, we have created designated Provider Care Teams.  These Care Teams include your primary Cardiologist (physician) and Advanced Practice Providers (APPs -  Physician Assistants and Nurse Practitioners) who all work together to provide you with the care you need, when you need it. Quay Burow, MD   Your physician wants you to follow-up in: 1 year with [name]. You will receive a reminder letter in the mail two months in advance. If you don't receive a letter, please call our office to schedule the follow-up appointment.

## 2019-12-27 ENCOUNTER — Other Ambulatory Visit: Payer: Self-pay | Admitting: Cardiovascular Disease

## 2020-01-06 ENCOUNTER — Other Ambulatory Visit: Payer: Self-pay | Admitting: Family Medicine

## 2020-01-08 ENCOUNTER — Other Ambulatory Visit: Payer: Self-pay | Admitting: Cardiovascular Disease

## 2020-01-08 DIAGNOSIS — I25810 Atherosclerosis of coronary artery bypass graft(s) without angina pectoris: Secondary | ICD-10-CM

## 2020-01-11 ENCOUNTER — Other Ambulatory Visit: Payer: Self-pay | Admitting: Internal Medicine

## 2020-01-19 ENCOUNTER — Other Ambulatory Visit: Payer: Self-pay | Admitting: Family Medicine

## 2020-01-27 ENCOUNTER — Other Ambulatory Visit: Payer: Self-pay | Admitting: Internal Medicine

## 2020-01-31 ENCOUNTER — Telehealth: Payer: Self-pay | Admitting: Family Medicine

## 2020-01-31 MED ORDER — FERROUS GLUCONATE 324 (38 FE) MG PO TABS: ORAL_TABLET | ORAL | 0 refills | Status: DC

## 2020-01-31 NOTE — Telephone Encounter (Signed)
RX sent in. Patient needs a physical for further refills. Left a detailed message on verified voice mail.

## 2020-01-31 NOTE — Telephone Encounter (Signed)
[facility] called to get a refill on  ferrous gluconate (FERGON) 324 MG tablet   The original prescriber said that the patient needs to contact his PCP to start getting this refilled.  Send to:   [id number] - [place], [address] Phone:  [phone]  Fax:  [phone]

## 2020-02-12 ENCOUNTER — Other Ambulatory Visit: Payer: Self-pay | Admitting: Family Medicine

## 2020-02-14 ENCOUNTER — Other Ambulatory Visit: Payer: Self-pay | Admitting: Cardiovascular Disease

## 2020-02-17 ENCOUNTER — Other Ambulatory Visit: Payer: Self-pay | Admitting: Family Medicine

## 2020-02-21 ENCOUNTER — Other Ambulatory Visit: Payer: Self-pay

## 2020-02-21 ENCOUNTER — Other Ambulatory Visit: Payer: Self-pay | Admitting: Family Medicine

## 2020-02-21 ENCOUNTER — Telehealth (INDEPENDENT_AMBULATORY_CARE_PROVIDER_SITE_OTHER): Payer: Medicare Other | Admitting: Family Medicine

## 2020-02-21 DIAGNOSIS — I6523 Occlusion and stenosis of bilateral carotid arteries: Secondary | ICD-10-CM

## 2020-02-21 DIAGNOSIS — J019 Acute sinusitis, unspecified: Secondary | ICD-10-CM

## 2020-02-21 MED ORDER — METOPROLOL TARTRATE 50 MG PO TABS: 50.0000 mg | ORAL_TABLET | Freq: Every day | ORAL | 3 refills | Status: DC

## 2020-02-21 MED ORDER — SIMVASTATIN 20 MG PO TABS: 20.0000 mg | ORAL_TABLET | Freq: Every day | ORAL | 3 refills | Status: DC

## 2020-02-21 MED ORDER — AMOXICILLIN-POT CLAVULANATE 875-125 MG PO TABS: 1.0000 | ORAL_TABLET | Freq: Two times a day (BID) | ORAL | 0 refills | Status: DC

## 2020-02-21 MED ORDER — LOSARTAN POTASSIUM-HCTZ 100-25 MG PO TABS: 1.0000 | ORAL_TABLET | Freq: Every day | ORAL | 3 refills | Status: DC

## 2020-02-22 ENCOUNTER — Encounter: Payer: Self-pay | Admitting: Family Medicine

## 2020-02-22 NOTE — Progress Notes (Signed)
   Subjective:    Patient ID: Earl Castillo, male    DOB: Jun 20, 1941, 79 y.o.   MRN: [id number]  HPI Virtual Visit via Telephone Note  I connected with the patient on 02/22/20 at  4:15 PM EDT by telephone and verified that I am speaking with the correct person using two identifiers.   I discussed the limitations, risks, security and privacy concerns of performing an evaluation and management service by telephone and the availability of in person appointments. I also discussed with the patient that there may be a patient responsible charge related to this service. The patient expressed understanding and agreed to proceed.  Location patient: home Location provider: work or home office Participants present for the call: patient, provider Patient did not have a visit in the prior 7 days to address this/these issue(s).   History of Present Illness: Here for 2 weeks of sinus congestion, PND, and blowing yellow mucus from the nose. No fever or cough or SOB. Drinking fluids.    Observations/Objective: Patient sounds cheerful and well on the phone. I do not appreciate any SOB. Speech and thought processing are grossly intact. Patient reported vitals:  Assessment and Plan: Sinusitis, treat with Augmentin.  Alysia Penna, MD   Follow Up Instructions:     [phone] 5-10 [phone] 11-20 [id number] 21-30 I did not refer this patient for an OV in the next 24 hours for this/these issue(s).  I discussed the assessment and treatment plan with the patient. The patient was provided an opportunity to ask questions and all were answered. The patient agreed with the plan and demonstrated an understanding of the instructions.   The patient was advised to call back or seek an in-person evaluation if the symptoms worsen or if the condition fails to improve as anticipated.  I provided  12 minutes of non-face-to-face time during this encounter.   Alysia Penna, MD    Review of Systems     Objective:   Physical Exam        Assessment & Plan:

## 2020-04-25 ENCOUNTER — Other Ambulatory Visit: Payer: Self-pay | Admitting: Family Medicine

## 2020-05-24 ENCOUNTER — Other Ambulatory Visit: Payer: Self-pay | Admitting: Cardiovascular Disease

## 2020-05-24 ENCOUNTER — Ambulatory Visit (HOSPITAL_COMMUNITY)
Admission: RE | Admit: 2020-05-24 | Discharge: 2020-05-24 | Disposition: A | Discharge status: Home / Self Care | Payer: Medicare Other | Source: Ambulatory Visit | Attending: Cardiology | Admitting: Cardiology

## 2020-05-24 ENCOUNTER — Other Ambulatory Visit: Payer: Self-pay

## 2020-05-24 ENCOUNTER — Ambulatory Visit (HOSPITAL_BASED_OUTPATIENT_CLINIC_OR_DEPARTMENT_OTHER)
Admission: RE | Admit: 2020-05-24 | Discharge: 2020-05-24 | Disposition: A | Discharge status: Home / Self Care | Payer: Medicare Other | Source: Ambulatory Visit | Attending: Cardiovascular Disease | Admitting: Cardiovascular Disease

## 2020-05-24 DIAGNOSIS — I739 Peripheral vascular disease, unspecified: Secondary | ICD-10-CM | POA: Diagnosis not present

## 2020-05-24 DIAGNOSIS — I6523 Occlusion and stenosis of bilateral carotid arteries: Secondary | ICD-10-CM | POA: Insufficient documentation

## 2020-05-26 ENCOUNTER — Other Ambulatory Visit: Payer: Self-pay

## 2020-05-26 DIAGNOSIS — I6523 Occlusion and stenosis of bilateral carotid arteries: Secondary | ICD-10-CM

## 2020-06-06 ENCOUNTER — Ambulatory Visit (INDEPENDENT_AMBULATORY_CARE_PROVIDER_SITE_OTHER): Payer: Medicare Other | Admitting: Cardiovascular Disease

## 2020-06-06 ENCOUNTER — Encounter: Payer: Self-pay | Admitting: Cardiovascular Disease

## 2020-06-06 ENCOUNTER — Other Ambulatory Visit: Payer: Self-pay

## 2020-06-06 VITALS — BP 161/54 | HR 50 | Ht 69.5 in | Wt 129.0 lb

## 2020-06-06 DIAGNOSIS — I6523 Occlusion and stenosis of bilateral carotid arteries: Secondary | ICD-10-CM

## 2020-06-06 DIAGNOSIS — I2510 Atherosclerotic heart disease of native coronary artery without angina pectoris: Secondary | ICD-10-CM | POA: Diagnosis not present

## 2020-06-06 DIAGNOSIS — T82898A Other specified complication of vascular prosthetic devices, implants and grafts, initial encounter: Secondary | ICD-10-CM | POA: Diagnosis not present

## 2020-06-06 DIAGNOSIS — E782 Mixed hyperlipidemia: Secondary | ICD-10-CM | POA: Diagnosis not present

## 2020-06-06 DIAGNOSIS — F17200 Nicotine dependence, unspecified, uncomplicated: Secondary | ICD-10-CM | POA: Diagnosis not present

## 2020-06-06 NOTE — Assessment & Plan Note (Signed)
History of carotid artery disease status post right carotid endarterectomy performed by Dr. Lindwood Qua in 2006.  We have been following his carotid Dopplers on annual basis since that time most recently performed 05/26/2020 revealing a widely patent endarterectomy site with moderate left ICA stenosis.  This will be repeated in 12 months.

## 2020-06-06 NOTE — Assessment & Plan Note (Signed)
History of PAD status post right to left femoral-femoral bypass graft doing by Dr. Carmell Austria in 2000.  I performed left SFA intervention 05/23/2014 which ultimately closed.  He had a left femoropopliteal bypass graft placed by Dr. [name] 08/09/2016.  He noticed increasing claudication approximately 6 months ago.  His most recent lower extremity arterial Doppler studies performed 05/24/2020 revealed a right ABI of 0.96 and a left of 0.54 with an occluded left femoropopliteal bypass graft.  I am going refer him back to Dr. [name] for further evaluation.

## 2020-06-06 NOTE — Patient Instructions (Addendum)
Medication Instructions:  Your physician recommends that you continue on your current medications as directed. Please refer to the Current Medication list given to you today.  *If you need a refill on your cardiac medications before your next appointment, please call your pharmacy*   Lab Work: NONE ordered at this time of appointment   If you have labs (blood work) drawn today and your tests are completely normal, you will receive your results only by: Marland Kitchen MyChart Message (if you have MyChart) OR . A paper copy in the mail If you have any lab test that is abnormal or we need to change your treatment, we will call you to review the results.   Testing/Procedures: Your physician has requested that you have a carotid duplex. This test is an ultrasound of the carotid arteries in your neck. It looks at blood flow through these arteries that supply the brain with blood. Allow one hour for this exam. There are no restrictions or special instructions.   Please schedule for 1 year   Follow-Up: At [facility], you and your health needs are our priority.  As part of our continuing mission to provide you with exceptional heart care, we have created designated Provider Care Teams.  These Care Teams include your primary Cardiologist (physician) and Advanced Practice Providers (APPs -  Physician Assistants and Nurse Practitioners) who all work together to provide you with the care you need, when you need it.  We recommend signing up for the patient portal called "MyChart".  Sign up information is provided on this After Visit Summary.  MyChart is used to connect with patients for Virtual Visits (Telemedicine).  Patients are able to view lab/test results, encounter notes, upcoming appointments, etc.  Non-urgent messages can be sent to your provider as well.   To learn more about what you can do with MyChart, go to [email or web address].    Your next appointment:   1 year(s)  The format for your next  appointment:   In Person  Provider:   Quay Burow, MD  Other Instructions   You have been referred to Dr. [name] at Vascular Surgery

## 2020-06-06 NOTE — Assessment & Plan Note (Signed)
Vascular surgery history of hyperlipidemia on simvastatin with lipid profile performed 12/23/2018 revealing a total cholesterol 113, LDL of 40 and HDL 59.

## 2020-06-06 NOTE — Assessment & Plan Note (Signed)
History of ongoing tobacco abuse of a third of a pack a day recalcitrant to risk factor modification.

## 2020-06-06 NOTE — Assessment & Plan Note (Signed)
History of essential hypertension a blood pressure measured today 161/54.  He is on losartan and hydrochlorothiazide as well as metoprolol.

## 2020-06-06 NOTE — Assessment & Plan Note (Signed)
History of CAD status post coronary artery bypass grafting by [name] in 2004 with a LIMA to his LAD, RIMA to the circumflex marginal and vein graft to the distal RCA.  He had a Myoview stress test performed in 13 which was nonischemic.  He denies chest pain or shortness of breath.

## 2020-06-06 NOTE — Progress Notes (Signed)
06/06/2020 Earl Castillo   1941/01/02  [id number]  Primary Physician [name], MD Primary Cardiologist: [name] MD FACP, [place], [place], [place]  HPI:  Earl Castillo is a 79 y.o. left femoropopliteal bypass graft patent bypass graft previously followed by [name] and now followed by myself. I last spoke to him for a virtual telemedicine phone visit on 12/23/2019.  His wife of 90 years unfortunately passed away 31-May-2017. He is living with hisyoungestson currently.The last time I saw him was at the time of his intervention in July. His past medical history is significant for CAD, status post coronary artery bypass grafting in 2004 by [name], with a LIMA to his LAD, RIMA to the circumflex marginal and SVG to the distal RCA. He had a Myoview stress test performed 08/23/2012 which was low risk and nonischemic. He also has hypertension and hyperlipidemia. He has a 60+ year history of tobacco use and continues to smoke daily. He has significant peripheral vascular disease. He is status post right carotid endarterectomy performed by [name] in 2006. We have been following his carotid disease yearly by duplex ultrasound. He has also had a left to right fem-fem crossover graft by [name] in May of 2000. He was recently seen by [name] and complained of worsening claudication. Recent lower extremity arterial Doppler studies demonstrated progression of disease in his left common and external iliac arteries. In addition, it was suggested that he had moderately severe distal left SFA disease. Subsequently, on 03/17/2014 he underwent a diagnostic PV angiogram performed by [name]. His fem-fem crossover graft was widely patent. His left iliac also appeared widely patent, suggesting falsely elevated velocities by duplex. He did have a high grade focal calcified mid to distal left SFA stenosis corresponding to an area of abnormality and high velocity in the duplex ultrasound with  three-vessel runoff. I performed diamondback orbital rotational atherectomy of his mid left SFA through his femorofemoral crossover graft 05/23/14 with stenting of his proximal left SFA as well. His subsequent Dopplers revealed an increase in his left ABI from 0.8-1.0 . His claudication has resolved. Since I saw him in the office 03/07/16 he denies chest pain. He did have fairly new onset left lower extremity claudication over the last several months with recent Dopplers that showed decline in his left ABI down to 0.4 with an occluded left SFA. I performed angiography on him 06/06/16 revealing a widely patent left iliac system, left to right femorofemoral crossover graft with an occluded left SFA at the origin reconstituting in the adductor canal by profunda femoris collaterals with three-vessel runoff. He also underwent elective left above-the-knee femoropopliteal bypass grafting with a 6 dominant external ring PTFE graft by Dr. [name] 08/09/16. His wounds are healing well. His claudication is improved. He did have some swelling of his lower extremity which she is treating by leg elevation.  Since I spoke to him on the phone approximately 6 months ago he is noticed increasing discomfort in his left leg.  The Doppler studies performed 05-31-20 revealed a right ABI of 0.96 and a left of 0.54 with an occluded left femoropopliteal bypass graft.  He denies chest pain or shortness of breath.  He does continue to smoke a third of a pack a day.    Current Meds  Medication Sig  . acetaminophen (TYLENOL) 500 MG tablet Take 1,000 mg by mouth daily as needed (for leg pain).   Marland Kitchen amoxicillin-clavulanate (AUGMENTIN) 875-125  MG tablet Take 1 tablet by mouth 2 (two) times daily.  Marland Kitchen aspirin EC 81 MG tablet Take 81 mg by mouth every evening.   . clopidogrel (PLAVIX) 75 MG tablet TAKE 1 TABLET BY MOUTH EVERY DAY  . Cyanocobalamin (B-12) 1000 MCG SUBL Place 1 tablet under the tongue daily.  Marland Kitchen docusate sodium (COLACE) 100 MG  capsule Take 100 mg by mouth daily as needed (for constipation).   . ferrous gluconate (FERGON) 324 MG tablet TAKE 1 TABLET BY MOUTH EVERY DAY WITH BREAKFAST  . isosorbide mononitrate (IMDUR) 30 MG 24 hr tablet TAKE 1 TABLET (30 MG TOTAL) BY MOUTH DAILY. NEED OV.  Marland Kitchen losartan-hydrochlorothiazide (HYZAAR) 100-25 MG tablet Take 1 tablet by mouth daily.  . metoprolol tartrate (LOPRESSOR) 50 MG tablet Take 1 tablet (50 mg total) by mouth daily.  Marland Kitchen NIFEdipine (ADALAT CC) 90 MG 24 hr tablet TAKE 1 TABLET BY MOUTH EVERY DAY  . simvastatin (ZOCOR) 20 MG tablet Take 1 tablet (20 mg total) by mouth daily at 6 PM. NEED OV.     No Known Allergies  Social History   Socioeconomic History  . Marital status: Married    Spouse name: Not on file  . Number of children: 2  . Years of education: Not on file  . Highest education level: Not on file  Occupational History  . Occupation:  Semi Retired Geophysicist/field seismologist  Tobacco Use  . Smoking status: Current Some Day Smoker    Packs/day: 0.25    Years: 58.00    Pack years: 14.50    Types: Cigarettes  . Smokeless tobacco: Never Used  . Tobacco comment: rarely smokes now; does have approx 38 pack hx  of smoking/ does now the LDCT   Substance and Sexual Activity  . Alcohol use: No    Alcohol/week: 0.0 standard drinks  . Drug use: No  . Sexual activity: Not Currently  Other Topics Concern  . Not on file  Social History Narrative   Daily caffeine    Social Determinants of Health   Financial Resource Strain:   . Difficulty of Paying Living Expenses:   Food Insecurity:   . Worried About Charity fundraiser in the Last Year:   . Arboriculturist in the Last Year:   Transportation Needs:   . Film/video editor (Medical):   Marland Kitchen Lack of Transportation (Non-Medical):   Physical Activity:   . Days of Exercise per Week:   . Minutes of Exercise per Session:   Stress:   . Feeling of Stress :   Social Connections:   . Frequency of Communication with Friends and  Family:   . Frequency of Social Gatherings with Friends and Family:   . Attends Religious Services:   . Active Member of Clubs or Organizations:   . Attends Archivist Meetings:   Marland Kitchen Marital Status:   Intimate Partner Violence:   . Fear of Current or Ex-Partner:   . Emotionally Abused:   Marland Kitchen Physically Abused:   . Sexually Abused:      Review of Systems: General: negative for chills, fever, night sweats or weight changes.  Cardiovascular: negative for chest pain, dyspnea on exertion, edema, orthopnea, palpitations, paroxysmal nocturnal dyspnea or shortness of breath Dermatological: negative for rash Respiratory: negative for cough or wheezing Urologic: negative for hematuria Abdominal: negative for nausea, vomiting, diarrhea, bright red blood per rectum, melena, or hematemesis Neurologic: negative for visual changes, syncope, or dizziness All other systems reviewed and are  otherwise negative except as noted above.    Blood pressure (!) 161/54, pulse (!) 50, height 5' 9.5" (1.765 m), weight 129 lb (58.5 kg), SpO2 97 %.  General appearance: alert and no distress Neck: no adenopathy, no JVD, supple, symmetrical, trachea midline, thyroid not enlarged, symmetric, no tenderness/mass/nodules and Bilateral carotid bruits Lungs: clear to auscultation bilaterally Heart: regular rate and rhythm, S1, S2 normal, no murmur, click, rub or gallop Extremities: Absent pedal pulses Pulses: Absent pedal pulses Skin: Skin color, texture, turgor normal. No rashes or lesions Neurologic: Alert and oriented X 3, normal strength and tone. Normal symmetric reflexes. Normal coordination and gait  EKG sinus bradycardia 50 with voltage criteria for LVH.  I personally reviewed this EKG.  ASSESSMENT AND PLAN:   Hyperlipidemia Vascular surgery history of hyperlipidemia on simvastatin with lipid profile performed 12/23/2018 revealing a total cholesterol 113, LDL of 40 and HDL 59.  Coronary  atherosclerosis History of CAD status post coronary artery bypass grafting by [name] in 2004 with a LIMA to his LAD, RIMA to the circumflex marginal and vein graft to the distal RCA.  He had a Myoview stress test performed in 13 which was nonischemic.  He denies chest pain or shortness of breath.  Current smoker History of ongoing tobacco abuse of a third of a pack a day recalcitrant to risk factor modification.  Essential hypertension History of essential hypertension a blood pressure measured today 161/54.  He is on losartan and hydrochlorothiazide as well as metoprolol.  Carotid artery disease History of carotid artery disease status post right carotid endarterectomy performed by Dr. Lindwood Qua in 2006.  We have been following his carotid Dopplers on annual basis since that time most recently performed 05/26/2020 revealing a widely patent endarterectomy site with moderate left ICA stenosis.  This will be repeated in 12 months.  PAD (peripheral artery disease) (HCC) History of PAD status post right to left femoral-femoral bypass graft doing by Dr. Carmell Austria in 2000.  I performed left SFA intervention 05/23/2014 which ultimately closed.  He had a left femoropopliteal bypass graft placed by Dr. [name] 08/09/2016.  He noticed increasing claudication approximately 6 months ago.  His most recent lower extremity arterial Doppler studies performed 05/24/2020 revealed a right ABI of 0.96 and a left of 0.54 with an occluded left femoropopliteal bypass graft.  I am going refer him back to Dr. [name] for further evaluation.      [name] MD [place], [facility] 06/06/2020 5:44 PM

## 2020-07-20 ENCOUNTER — Other Ambulatory Visit: Payer: Self-pay | Admitting: Family Medicine

## 2020-07-31 ENCOUNTER — Encounter: Payer: Self-pay | Admitting: Surgery

## 2020-07-31 ENCOUNTER — Ambulatory Visit (INDEPENDENT_AMBULATORY_CARE_PROVIDER_SITE_OTHER): Payer: Medicare Other | Admitting: Surgery

## 2020-07-31 ENCOUNTER — Other Ambulatory Visit: Payer: Self-pay

## 2020-07-31 VITALS — BP 202/69 | HR 50 | Temp 98.3°F | Resp 20 | Ht 69.5 in | Wt 130.0 lb

## 2020-07-31 DIAGNOSIS — I70212 Atherosclerosis of native arteries of extremities with intermittent claudication, left leg: Secondary | ICD-10-CM | POA: Diagnosis not present

## 2020-07-31 DIAGNOSIS — I6523 Occlusion and stenosis of bilateral carotid arteries: Secondary | ICD-10-CM | POA: Diagnosis not present

## 2020-07-31 NOTE — Progress Notes (Signed)
[facility] [place]  Patient name: Earl Castillo MRN: [id number] DOB: Nov 04, 1941 Sex: male   REASON FOR VISIT:    Follow up  HISOTRY OF PRESENT ILLNESS:    Earl Castillo is a 79 y.o. male who is status post left to right femoral-femoral bypass graft by [name] in 2000.  This was done for an occluded right common iliac artery.  He then underwent multiple percutaneous interventions by [name].  I then performed a left femoral to above-knee popliteal artery bypass graft with PTFE on 08/09/2016.  He recently began having a return of his claudication symptoms on the left.  Doppler studies in July 2021 revealed an occluded femoral-popliteal bypass graft.  He does endorse short distance claudication, however he does not have symptoms on a daily basis.  He does not have any open wounds or rest pain.  Patient continues to smoke, but has cut back significantly.  He is on a statin for hypercholesterolemia.  He is on dual antiplatelet therapy.  He suffers from coronary artery disease and is status post CABG in 2004.  He has undergone carotid endarterectomy by Dr. Deon Pilling.  His most recent carotid Doppler study showed a 60 to 79% left carotid stenosis for which he is asymptomatic.   PAST MEDICAL HISTORY:   Past Medical History:  Diagnosis Date  . Anemia   . Angiodysplasia of intestine - small bowel  09/21/2013  . Blood transfusion abn reaction or complication, no procedure mishap    "HgB dropped real low"  . Cancer [facility])    Skin cancers  . Carotid artery disease ([place])    status post carotid endarterectomy by [name]  . Chicken pox   . Colon polyps 08/2012   Hyperplastic and adenomatous sessile polyps  . Coronary artery disease    CABG'04, Low risk Myoview 2013  . GERD (gastroesophageal reflux disease)   . Hemorrhoid   . History of blood transfusion 08/2013   "HgB dropped real low; related to bleeding from my rectum"    . History of kidney stones    1 removed surgical, passed 2  . Hyperlipidemia   . Hypertension   . 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 Lower GI bleeding 08/2013  . Osteoarthritis    "hips" (05/23/2014)  . Peripheral arterial disease ([place])    status post left to right fem-fem crossover graft in by [name]  . Pneumonia    - "years ago"  . Sinus headache    "alot" (05/23/2014)  . Skin exam, screening for cancer    sees [name]   . Vitamin B12 deficiency 11/23/2013     FAMILY HISTORY:   Family History  Problem Relation Age of Onset  . Heart disease Father   . Heart disease Sister   . Coronary artery disease Brother        CABG x 6  . Heart disease Brother   . Coronary artery disease Brother        first MI 16, died at 56 during cardiac surgery  . Coronary artery disease Sister        CABG x 5, RAS  . Heart disease Sister   . Colon cancer Neg Hx     SOCIAL HISTORY:   Social History   Tobacco Use  . Smoking status: Current Some Day Smoker    Packs/day: 0.25    Years: 58.00  Pack years: 14.50    Types: Cigarettes  . Smokeless tobacco: Never Used  . Tobacco comment: rarely smokes now; does have approx 38 pack hx  of smoking/ does now the LDCT   Substance Use Topics  . Alcohol use: No    Alcohol/week: 0.0 standard drinks     ALLERGIES:   No Known Allergies   CURRENT MEDICATIONS:   Current Outpatient Medications  Medication Sig Dispense Refill  . acetaminophen (TYLENOL) 500 MG tablet Take 1,000 mg by mouth daily as needed (for leg pain).     Marland Kitchen amoxicillin-clavulanate (AUGMENTIN) 875-125 MG tablet Take 1 tablet by mouth 2 (two) times daily. 20 tablet 0  . aspirin EC 81 MG tablet Take 81 mg by mouth every evening.     . clopidogrel (PLAVIX) 75 MG tablet TAKE 1 TABLET BY MOUTH EVERY DAY 90 tablet 1  . Cyanocobalamin (B-12) 1000 MCG SUBL Place 1 tablet under the tongue daily.    Marland Kitchen  docusate sodium (COLACE) 100 MG capsule Take 100 mg by mouth daily as needed (for constipation).     . ferrous gluconate (FERGON) 324 MG tablet TAKE 1 TABLET BY MOUTH EVERY DAY WITH BREAKFAST. SCHEDULE OFFICE VISIT FOR ADDITIONAL REFILLS 90 tablet 0  . isosorbide mononitrate (IMDUR) 30 MG 24 hr tablet TAKE 1 TABLET (30 MG TOTAL) BY MOUTH DAILY. NEED OV. 90 tablet 3  . losartan-hydrochlorothiazide (HYZAAR) 100-25 MG tablet Take 1 tablet by mouth daily. 90 tablet 3  . metoprolol tartrate (LOPRESSOR) 50 MG tablet Take 1 tablet (50 mg total) by mouth daily. 90 tablet 3  . NIFEdipine (ADALAT CC) 90 MG 24 hr tablet TAKE 1 TABLET BY MOUTH EVERY DAY 90 tablet 3  . simvastatin (ZOCOR) 20 MG tablet Take 1 tablet (20 mg total) by mouth daily at 6 PM. NEED OV. 90 tablet 3   No current facility-administered medications for this visit.    REVIEW OF SYSTEMS:   [X]  denotes positive finding, [ ]  denotes negative finding Cardiac  Comments:  Chest pain or chest pressure:    Shortness of breath upon exertion:    Short of breath when lying flat:    Irregular heart rhythm:        Vascular    Pain in calf, thigh, or hip brought on by ambulation: x   Pain in feet at night that wakes you up from your sleep:     Blood clot in your veins:    Leg swelling:  x       Pulmonary    Oxygen at home:    Productive cough:     Wheezing:         Neurologic    Sudden weakness in arms or legs:     Sudden numbness in arms or legs:     Sudden onset of difficulty speaking or slurred speech:    Temporary loss of vision in one eye:     Problems with dizziness:         Gastrointestinal    Blood in stool:     Vomited blood:         Genitourinary    Burning when urinating:     Blood in urine:        Psychiatric    Major depression:         Hematologic    Bleeding problems:    Problems with blood clotting too easily:        Skin    Rashes or ulcers:  Constitutional    Fever or chills:      PHYSICAL  EXAM:   There were no vitals filed for this visit.  GENERAL: The patient is a well-nourished male, in no acute distress. The vital signs are documented above. CARDIAC: There is a regular rate and rhythm.  VASCULAR: 1+ pitting edema bilaterally.  Pedal pulses are nonpalpable. PULMONARY: Non-labored respirations MUSCULOSKELETAL: There are no major deformities or cyanosis. NEUROLOGIC: No focal weakness or paresthesias are detected. SKIN: There are no ulcers or rashes noted. PSYCHIATRIC: The patient has a normal affect.  STUDIES:   I have reviewed the following studies  Carotid duplex: Right Carotid: Velocities in the right ICA are consistent with a 1-39%  stenosis.         Non-hemodynamically significant plaque <50% noted in the  CCA.   Left Carotid: Velocities in the left ICA are consistent with a 60-79%  stenosis.        Non-hemodynamically significant plaque <50% noted in the  CCA.        The ECA appears >50% stenosed.   Vertebrals: Bilateral vertebral arteries demonstrate antegrade flow.  Subclavians: Normal flow hemodynamics were seen in bilateral subclavian        arteries.   +-------+-----------+-----------+------------+------------+  ABI/TBIToday's ABIToday's TBIPrevious ABIPrevious TBI  +-------+-----------+-----------+------------+------------+  Right 0.96    0.66    0.81    0.62      +-------+-----------+-----------+------------+------------+  Left  0.54    0.28    0.91    0.57      +-------+-----------+-----------+------------+------------+    Lower extremity duplex: Left: Left Graft(s): Left fem - right fem bypass graft is patent with  mildly elevated velocities at the proximal anastomosis and outflow.   Left femoral to above-knee popliteal bypass graft is occluded  MEDICAL ISSUES:   PAD: The patient has had interval occlusion of his left femoral-popliteal bypass graft.  He does  endorse short distance claudication, however his activity level is such that he does not experience symptoms on a daily basis.  He states that this is not lifestyle limiting for him.  Because of this, I have recommended that we not proceed with further revascularization as this would be a below-knee bypass graft with Gore-Tex.  I did discuss with him that if he develops rest pain or nonhealing wound that he needs to contact me directly so that we can proceed with angiography and revascularization.  Leg swelling: I have given him information regarding compression socks.  The patient will continue surveillance imaging with Dr. Alvester Chou in follow-up with me on an as-needed basis.    Leia Alf, MD, FACS [facility] [phone] Pager [phone]

## 2020-08-10 ENCOUNTER — Other Ambulatory Visit: Payer: Self-pay

## 2020-08-10 ENCOUNTER — Ambulatory Visit (INDEPENDENT_AMBULATORY_CARE_PROVIDER_SITE_OTHER): Payer: Medicare Other

## 2020-08-10 DIAGNOSIS — Z0000 Encounter for general adult medical examination without abnormal findings: Secondary | ICD-10-CM | POA: Diagnosis not present

## 2020-08-10 NOTE — Progress Notes (Signed)
Subjective:   Earl Castillo is a 79 y.o. male who presents for Medicare Annual/Subsequent preventive examination. I connected with Charlane Ferretti today by telephone and verified that I am speaking with the correct person using two identifiers. Location patient: home Location provider: work Persons participating in the virtual visit: patient, provider.   I discussed the limitations, risks, security and privacy concerns of performing an evaluation and management service by telephone and the availability of in person appointments. I also discussed with the patient that there may be a patient responsible charge related to this service. The patient expressed understanding and verbally consented to this telephonic visit.    Interactive audio and video telecommunications were attempted between this provider and patient, however failed, due to patient having technical difficulties OR patient did not have access to video capability.  We continued and completed visit with audio only.      Review of Systems    N/A Cardiac Risk Factors include: advanced age (>103men, >36 women);male gender;hypertension     Objective:    There were no vitals filed for this visit. There is no height or weight on file to calculate BMI.  Advanced Directives 08/10/2020 07/31/2020 12/13/2016 12/09/2016 08/09/2016 08/07/2016 07/29/2016  Does Patient Have a Medical Advance Directive? No No No No No No No  Would patient like information on creating a medical advance directive? No - Patient declined No - Patient declined - - No - patient declined information No - patient declined information No - patient declined information  Pre-existing out of facility DNR order (yellow form or pink MOST form) - - - - - - -    Current Medications (verified) Outpatient Encounter Medications as of 08/10/2020  Medication Sig  . acetaminophen (TYLENOL) 500 MG tablet Take 1,000 mg by mouth daily as needed (for leg pain).   Marland Kitchen aspirin EC 81 MG  tablet Take 81 mg by mouth every evening.   . clopidogrel (PLAVIX) 75 MG tablet TAKE 1 TABLET BY MOUTH EVERY DAY  . Cyanocobalamin (B-12) 1000 MCG SUBL Place 1 tablet under the tongue daily.  Marland Kitchen docusate sodium (COLACE) 100 MG capsule Take 100 mg by mouth daily as needed (for constipation).   . ferrous gluconate (FERGON) 324 MG tablet TAKE 1 TABLET BY MOUTH EVERY DAY WITH BREAKFAST. SCHEDULE OFFICE VISIT FOR ADDITIONAL REFILLS  . isosorbide mononitrate (IMDUR) 30 MG 24 hr tablet TAKE 1 TABLET (30 MG TOTAL) BY MOUTH DAILY. NEED OV.  Marland Kitchen losartan-hydrochlorothiazide (HYZAAR) 100-25 MG tablet Take 1 tablet by mouth daily.  . metoprolol tartrate (LOPRESSOR) 50 MG tablet Take 1 tablet (50 mg total) by mouth daily.  Marland Kitchen NIFEdipine (ADALAT CC) 90 MG 24 hr tablet TAKE 1 TABLET BY MOUTH EVERY DAY  . simvastatin (ZOCOR) 20 MG tablet Take 1 tablet (20 mg total) by mouth daily at 6 PM. NEED OV.  . [DISCONTINUED] amoxicillin-clavulanate (AUGMENTIN) 875-125 MG tablet Take 1 tablet by mouth 2 (two) times daily.   No facility-administered encounter medications on file as of 08/10/2020.    Allergies (verified) Patient has no known allergies.   History: Past Medical History:  Diagnosis Date  . Anemia   . Angiodysplasia of intestine - small bowel  09/21/2013  . Blood transfusion abn reaction or complication, no procedure mishap    "HgB dropped real low"  . Cancer [facility])    Skin cancers  . Carotid artery disease ([place])    status post carotid endarterectomy by [name]  . Chicken pox   .  Colon polyps 08/2012   Hyperplastic and adenomatous sessile polyps  . Coronary artery disease    CABG'04, Low risk Myoview 2013  . GERD (gastroesophageal reflux disease)   . Hemorrhoid   . History of blood transfusion 08/2013   "HgB dropped real low; related to bleeding from my rectum"  . History of kidney stones    1 removed surgical, passed 2  . Hyperlipidemia   . Hypertension   . 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 Lower GI bleeding 08/2013  . Osteoarthritis    "hips" (05/23/2014)  . Peripheral arterial disease ([place])    status post left to right fem-fem crossover graft in by [name]  . Pneumonia    - "years ago"  . Sinus headache    "alot" (05/23/2014)  . Skin exam, screening for cancer    sees [name]   . Vitamin B12 deficiency 11/23/2013   Past Surgical History:  Procedure Laterality Date  . BAND HEMORRHOIDECTOMY    . BASAL CELL CARCINOMA EXCISION  May 2009   per [name] , left nose   . basket extraction of ureteral stone  1970's  . Bypass rt leg Right 2000  . capsule endoscopy  10-05-13   per [name], 4 AVM's  . CARDIAC CATHETERIZATION  08/18/2003   Recommend CABG  . CARDIOVASCULAR STRESS TEST - LEXISCAN  08/25/2012   No significant wall motion abnormalities  . CAROTID ANGIOGRAM N/A 03/17/2014   Procedure: CAROTID ANGIOGRAM;  Surgeon: [name], MD;  Location: [facility] CATH LAB;  Service: Cardiovascular;  Laterality: N/A;  right carotid  . CAROTID ENDARTERECTOMY Right   . CAROTID ENDARTERECTOMY Right 03/26/2005   Dr Georgina Quint  . CATARACT EXTRACTION W/ INTRAOCULAR LENS  IMPLANT, BILATERAL Bilateral 2014   per [name]  . [place]   "bulging disc"  . COLONOSCOPY  08-12-12   per [name], tubular adenomas, repeat in 5 yrs   . CORONARY ARTERY BYPASS GRAFT  08/30/2003   x3. LIMA-distal LAD, RIMA-circumflex, SVG-distal RCA  . CYSTOSCOPY W/ STONE MANIPULATION    . ESOPHAGOGASTRODUODENOSCOPY N/A 08/17/2013   Procedure: ESOPHAGOGASTRODUODENOSCOPY (EGD);  Surgeon: [name], MD;  Location: [facility] ENDOSCOPY;  Service: Endoscopy;  Laterality: N/A;  . FEMORAL ARTERY STENT Left 05/23/2014  . FEMORAL-FEMORAL BYPASS GRAFT  2003  . FEMORAL-POPLITEAL BYPASS GRAFT Left 08/09/2016   Procedure: BYPASS GRAFT LEFT FEMORAL-POPLITEAL ARTERY USING GORE 6MM X 80CM PROPATEN GRAFT;   Surgeon: Serafina Mitchell, MD;  Location: [place];  Service: Vascular;  Laterality: Left;  . LOWER EXTREMITY ANGIOGRAM N/A 03/17/2014   Procedure: LOWER EXTREMITY ANGIOGRAM;  Surgeon: [name], MD;  Location: [facility] CATH LAB;  Service: Cardiovascular;  Laterality: N/A;  . PERIPHERAL VASCULAR CATHETERIZATION N/A 06/06/2016   Procedure: Lower Extremity Angiography;  Surgeon: [name], MD;  Location: [facility] CV LAB;  Service: Cardiovascular;  Laterality: N/A;  . PERIPHERAL VASCULAR CATHETERIZATION N/A 06/06/2016   Procedure: Abdominal Aortogram;  Surgeon: [name], MD;  Location: [place] CV LAB;  Service: Cardiovascular;  Laterality: N/A;  . RENAL DUPLEX Bilateral 02/02/2013   Bilateral renal arteries demonstrate narrowingconsistent with a 60-99% diameter reduction.  . TRANSTHORACIC ECHOCARDIOGRAM  08/02/2013   EF 55-60%, normal echocardiogram   Family History  Problem Relation Age of Onset  . Heart disease Father   . Heart disease Sister   . Coronary artery disease Brother  CABG x 6  . Heart disease Brother   . Coronary artery disease Brother        first MI 4, died at 88 during cardiac surgery  . Coronary artery disease Sister        CABG x 5, RAS  . Heart disease Sister   . Colon cancer Neg Hx    Social History   Socioeconomic History  . Marital status: Married    Spouse name: Not on file  . Number of children: 2  . Years of education: Not on file  . Highest education level: Not on file  Occupational History  . Occupation:  Semi Retired Geophysicist/field seismologist  Tobacco Use  . Smoking status: Current Some Day Smoker    Packs/day: 0.25    Years: 58.00    Pack years: 14.50    Types: Cigarettes  . Smokeless tobacco: Never Used  . Tobacco comment: rarely smokes now; does have approx 38 pack hx  of smoking/ does now the LDCT   Vaping Use  . Vaping Use: Never used  Substance and Sexual Activity  . Alcohol use: No    Alcohol/week: 0.0 standard drinks  . Drug use: No    . Sexual activity: Not Currently  Other Topics Concern  . Not on file  Social History Narrative   Daily caffeine    Social Determinants of Health   Financial Resource Strain: Low Risk   . Difficulty of Paying Living Expenses: Not hard at all  Food Insecurity: No Food Insecurity  . Worried About Charity fundraiser in the Last Year: Never true  . Ran Out of Food in the Last Year: Never true  Transportation Needs: No Transportation Needs  . Lack of Transportation (Medical): No  . Lack of Transportation (Non-Medical): No  Physical Activity: Insufficiently Active  . Days of Exercise per Week: 3 days  . Minutes of Exercise per Session: 20 min  Stress: No Stress Concern Present  . Feeling of Stress : Not at all  Social Connections: Moderately Isolated  . Frequency of Communication with Friends and Family: More than three times a week  . Frequency of Social Gatherings with Friends and Family: More than three times a week  . Attends Religious Services: 1 to 4 times per year  . Active Member of Clubs or Organizations: No  . Attends Archivist Meetings: Never  . Marital Status: Widowed    Tobacco Counseling Ready to quit: Not Answered Counseling given: Not Answered Comment: rarely smokes now; does have approx 38 pack hx  of smoking/ does now the LDCT    Clinical Intake:  Pre-visit preparation completed: Yes  Pain : No/denies pain     Nutritional Risks: Unintentional weight loss (Patient has lost weight due to recent loss of wife) Diabetes: No  How often do you need to have someone help you when you read instructions, pamphlets, or other written materials from your doctor or pharmacy?: 1 - Never What is the last grade level you completed in school?: High School  Diabetic?No  Interpreter Needed?: No  Information entered by :: [place] of Daily Living In your present state of health, do you have any difficulty performing the following  activities: 08/10/2020  Hearing? Y  Comment Patient states has decreased hearing in left ear  Vision? N  Difficulty concentrating or making decisions? Y  Comment patient states he will forget things at times  Walking or climbing stairs? N  Comment has to hold  on to hand rail  Dressing or bathing? N  Doing errands, shopping? N  Preparing Food and eating ? N  Using the Toilet? N  In the past six months, have you accidently leaked urine? N  Do you have problems with loss of bowel control? N  Managing your Medications? N  Managing your Finances? N  Housekeeping or managing your Housekeeping? N  Some recent data might be hidden    Patient Care Team: [name], MD as PCP - General [name] Pearletha Forge, MD as Consulting Physician (Cardiology)  Indicate any recent Medical Services you may have received from other than [facility] providers in the past year (date may be approximate).     Assessment:   This is a routine wellness examination for Tareq.  Hearing/Vision screen  Hearing Screening   125Hz  250Hz  500Hz  1000Hz  2000Hz  3000Hz  4000Hz  6000Hz  8000Hz   Right ear:           Left ear:           Vision Screening Comments: States gets eyes checked every other year,   Dietary issues and exercise activities discussed: Current Exercise Habits: Home exercise routine, Type of exercise: walking, Time (Minutes): 20, Frequency (Times/Week): 3, Weekly Exercise (Minutes/Week): 60, Intensity: Mild, Exercise limited by: cardiac condition(s)  Goals    . Exercise 150 minutes per week (moderate activity)     States he will get more exercise Will walk more in the summer; [place] has a long driveway Is up and down hills  Will do this every day he does not work       Depression Screen PHQ 2/9 Scores 08/10/2020 12/23/2018 12/17/2018 12/13/2016 04/29/2014  PHQ - 2 Score 0 1 1 0 0  PHQ- 9 Score 0 - - - -    Fall Risk Fall Risk  08/10/2020 12/23/2018 12/17/2018 12/13/2016 04/29/2014  Falls in the past year? 1 0  0 No No  Number falls in past yr: 0 0 - - -  Injury with Fall? 0 0 - - -  Risk for fall due to : No Fall Risks - - - -  Follow up Falls evaluation completed;Falls prevention discussed - - - -    Any stairs in or around the home? No  If so, are there any without handrails? No  Home free of loose throw rugs in walkways, pet beds, electrical cords, etc? Yes  Adequate lighting in your home to reduce risk of falls? Yes   ASSISTIVE DEVICES UTILIZED TO PREVENT FALLS:  Life alert? No  Use of a cane, walker or w/c? No  Grab bars in the bathroom? No  Shower chair or bench in shower? No  Elevated toilet seat or a handicapped toilet? No     Cognitive Function: Patient declined cognitive screening   MMSE - Mini Mental State Exam 12/13/2016  Not completed: (No Data)        Immunizations Immunization History  Administered Date(s) Administered  . Influenza, High Dose Seasonal PF 06/28/2016, 08/22/2017, 08/28/2018  . Influenza,inj,Quad PF,6+ Mos 08/18/2013, 07/01/2014, 06/30/2015  . Pneumococcal Conjugate-13 08/04/2015  . Pneumococcal Polysaccharide-23 08/18/2013  . Tdap 12/23/2018    TDAP status: Up to date Flu Vaccine status: Completed at today's visit Pneumococcal vaccine status: Up to date Covid-19 vaccine status: Completed vaccines  Qualifies for Shingles Vaccine? Yes   Zostavax completed No   Shingrix Completed?: No.    Education has been provided regarding the importance of this vaccine. Patient has been advised to call insurance company  to determine out of pocket expense if they have not yet received this vaccine. Advised may also receive vaccine at local pharmacy or Health Dept. Verbalized acceptance and understanding.  Screening Tests Health Maintenance  Topic Date Due  . Hepatitis C Screening  Never done  . COVID-19 Vaccine (1) Never done  . COLONOSCOPY  08/12/2017  . INFLUENZA VACCINE  06/04/2020  . TETANUS/TDAP  12/23/2028  . PNA vac Low Risk Adult  Completed     Health Maintenance  Health Maintenance Due  Topic Date Due  . Hepatitis C Screening  Never done  . COVID-19 Vaccine (1) Never done  . COLONOSCOPY  08/12/2017  . INFLUENZA VACCINE  06/04/2020    Colorectal cancer screening: Referral to GI placed 08/10/2020. Pt aware the office will call re: appt.  Lung Cancer Screening: (Low Dose CT Chest recommended if Age 58-80 years, 30 pack-year currently smoking OR have quit w/in 15years.) does not qualify.   Lung Cancer Screening Referral: N/A  Additional Screening:  Hepatitis C Screening: does not qualify;   Vision Screening: Recommended annual ophthalmology exams for early detection of glaucoma and other disorders of the eye. Is the patient up to date with their annual eye exam?  No  Who is the provider or what is the name of the office in which the patient attends annual eye exams? Dr.Shapiro  If pt is not established with a provider, would they like to be referred to a provider to establish care? No .   Dental Screening: Recommended annual dental exams for proper oral hygiene  Community Resource Referral / Chronic Care Management: CRR required this visit?  No   CCM required this visit?  No      Plan:     I have personally reviewed and noted the following in the patient's chart:   . Medical and social history . Use of alcohol, tobacco or illicit drugs  . Current medications and supplements . Functional ability and status . Nutritional status . Physical activity . Advanced directives . List of other physicians . Hospitalizations, surgeries, and ER visits in previous 12 months . Vitals . Screenings to include cognitive, depression, and falls . Referrals and appointments  In addition, I have reviewed and discussed with patient certain preventive protocols, quality metrics, and best practice recommendations. A written personalized care plan for preventive services as well as general preventive health recommendations were  provided to patient.     Ofilia Neas, LPN   [id number]/[id number]   Nurse Notes: None

## 2020-08-10 NOTE — Patient Instructions (Signed)
[name] , Thank you for taking time to come for your Medicare Wellness Visit. I appreciate your ongoing commitment to your health goals. Please review the following plan we discussed and let me know if I can assist you in the future.   Screening recommendations/referrals: Colonoscopy: Currently due, orders placed this visit Recommended yearly ophthalmology/optometry visit for glaucoma screening and checkup Recommended yearly dental visit for hygiene and checkup  Vaccinations: Influenza vaccine: Currently due, you may receive this at our office or your local pharmacy  Pneumococcal vaccine: Completed series Tdap vaccine: Up to date, next due 12/23/2028 Shingles vaccine: Currently due, you may receive at your local pharmacy     Advanced directives: Advance directive discussed with you today. Even though you declined this today please call our office should you change your mind and we can give you the proper paperwork for you to fill out.   Conditions/risks identified: None  Next appointment: None  Preventive Care 65 Years and Older, Male Preventive care refers to lifestyle choices and visits with your health care provider that can promote health and wellness. What does preventive care include?  A yearly physical exam. This is also called an annual well check.  Dental exams once or twice a year.  Routine eye exams. Ask your health care provider how often you should have your eyes checked.  Personal lifestyle choices, including:  Daily care of your teeth and gums.  Regular physical activity.  Eating a healthy diet.  Avoiding tobacco and drug use.  Limiting alcohol use.  Practicing safe sex.  Taking low doses of aspirin every day.  Taking vitamin and mineral supplements as recommended by your health care provider. What happens during an annual well check? The services and screenings done by your health care provider during your annual well check will depend on your age,  overall health, lifestyle risk factors, and family history of disease. Counseling  Your health care provider may ask you questions about your:  Alcohol use.  Tobacco use.  Drug use.  Emotional well-being.  Home and relationship well-being.  Sexual activity.  Eating habits.  History of falls.  Memory and ability to understand (cognition).  Work and work Statistician. Screening  You may have the following tests or measurements:  Height, weight, and BMI.  Blood pressure.  Lipid and cholesterol levels. These may be checked every 5 years, or more frequently if you are over 62 years old.  Skin check.  Lung cancer screening. You may have this screening every year starting at age 57 if you have a 30-pack-year history of smoking and currently smoke or have quit within the past 15 years.  Fecal occult blood test (FOBT) of the stool. You may have this test every year starting at age 20.  Flexible sigmoidoscopy or colonoscopy. You may have a sigmoidoscopy every 5 years or a colonoscopy every 10 years starting at age 71.  Prostate cancer screening. Recommendations will vary depending on your family history and other risks.  Hepatitis C blood test.  Hepatitis B blood test.  Sexually transmitted disease (STD) testing.  Diabetes screening. This is done by checking your blood sugar (glucose) after you have not eaten for a while (fasting). You may have this done every 1-3 years.  Abdominal aortic aneurysm (AAA) screening. You may need this if you are a current or former smoker.  Osteoporosis. You may be screened starting at age 56 if you are at high risk. Talk with your health care provider about your test  results, treatment options, and if necessary, the need for more tests. Vaccines  Your health care provider may recommend certain vaccines, such as:  Influenza vaccine. This is recommended every year.  Tetanus, diphtheria, and acellular pertussis (Tdap, Td) vaccine. You may  need a Td booster every 10 years.  Zoster vaccine. You may need this after age 55.  Pneumococcal 13-valent conjugate (PCV13) vaccine. One dose is recommended after age 89.  Pneumococcal polysaccharide (PPSV23) vaccine. One dose is recommended after age 44. Talk to your health care provider about which screenings and vaccines you need and how often you need them. This information is not intended to replace advice given to you by your health care provider. Make sure you discuss any questions you have with your health care provider. Document Released: 11/17/2015 Document Revised: 07/10/2016 Document Reviewed: 08/22/2015 Elsevier Interactive Patient Education  2017 [place] Prevention in the Home Falls can cause injuries. They can happen to people of all ages. There are many things you can do to make your home safe and to help prevent falls. What can I do on the outside of my home?  Regularly fix the edges of walkways and driveways and fix any cracks.  Remove anything that might make you trip as you walk through a door, such as a raised step or threshold.  Trim any bushes or trees on the path to your home.  Use bright outdoor lighting.  Clear any walking paths of anything that might make someone trip, such as rocks or tools.  Regularly check to see if handrails are loose or broken. Make sure that both sides of any steps have handrails.  Any raised decks and porches should have guardrails on the edges.  Have any leaves, snow, or ice cleared regularly.  Use sand or salt on walking paths during winter.  Clean up any spills in your garage right away. This includes oil or grease spills. What can I do in the bathroom?  Use night lights.  Install grab bars by the toilet and in the tub and shower. Do not use towel bars as grab bars.  Use non-skid mats or decals in the tub or shower.  If you need to sit down in the shower, use a plastic, non-slip stool.  Keep the floor  dry. Clean up any water that spills on the floor as soon as it happens.  Remove soap buildup in the tub or shower regularly.  Attach bath mats securely with double-sided non-slip rug tape.  Do not have throw rugs and other things on the floor that can make you trip. What can I do in the bedroom?  Use night lights.  Make sure that you have a light by your bed that is easy to reach.  Do not use any sheets or blankets that are too big for your bed. They should not hang down onto the floor.  Have a firm chair that has side arms. You can use this for support while you get dressed.  Do not have throw rugs and other things on the floor that can make you trip. What can I do in the kitchen?  Clean up any spills right away.  Avoid walking on wet floors.  Keep items that you use a lot in easy-to-reach places.  If you need to reach something above you, use a strong step stool that has a grab bar.  Keep electrical cords out of the way.  Do not use floor polish or wax that makes  floors slippery. If you must use wax, use non-skid floor wax.  Do not have throw rugs and other things on the floor that can make you trip. What can I do with my stairs?  Do not leave any items on the stairs.  Make sure that there are handrails on both sides of the stairs and use them. Fix handrails that are broken or loose. Make sure that handrails are as long as the stairways.  Check any carpeting to make sure that it is firmly attached to the stairs. Fix any carpet that is loose or worn.  Avoid having throw rugs at the top or bottom of the stairs. If you do have throw rugs, attach them to the floor with carpet tape.  Make sure that you have a light switch at the top of the stairs and the bottom of the stairs. If you do not have them, ask someone to add them for you. What else can I do to help prevent falls?  Wear shoes that:  Do not have high heels.  Have rubber bottoms.  Are comfortable and fit you  well.  Are closed at the toe. Do not wear sandals.  If you use a stepladder:  Make sure that it is fully opened. Do not climb a closed stepladder.  Make sure that both sides of the stepladder are locked into place.  Ask someone to hold it for you, if possible.  Clearly mark and make sure that you can see:  Any grab bars or handrails.  First and last steps.  Where the edge of each step is.  Use tools that help you move around (mobility aids) if they are needed. These include:  Canes.  Walkers.  Scooters.  Crutches.  Turn on the lights when you go into a dark area. Replace any light bulbs as soon as they burn out.  Set up your furniture so you have a clear path. Avoid moving your furniture around.  If any of your floors are uneven, fix them.  If there are any pets around you, be aware of where they are.  Review your medicines with your doctor. Some medicines can make you feel dizzy. This can increase your chance of falling. Ask your doctor what other things that you can do to help prevent falls. This information is not intended to replace advice given to you by your health care provider. Make sure you discuss any questions you have with your health care provider. Document Released: 08/17/2009 Document Revised: 03/28/2016 Document Reviewed: 11/25/2014 Elsevier Interactive Patient Education  2017 [employer].

## 2020-08-11 ENCOUNTER — Other Ambulatory Visit: Payer: Self-pay | Admitting: Cardiovascular Disease

## 2020-08-11 NOTE — Telephone Encounter (Signed)
Rx has been sent to the pharmacy electronically. ° °

## 2020-09-22 ENCOUNTER — Other Ambulatory Visit: Payer: Self-pay | Admitting: Family Medicine

## 2020-11-14 ENCOUNTER — Telehealth (INDEPENDENT_AMBULATORY_CARE_PROVIDER_SITE_OTHER): Payer: Medicare Other | Admitting: Family Medicine

## 2020-11-14 ENCOUNTER — Encounter: Payer: Self-pay | Admitting: Family Medicine

## 2020-11-14 DIAGNOSIS — J019 Acute sinusitis, unspecified: Secondary | ICD-10-CM

## 2020-11-14 MED ORDER — AMOXICILLIN-POT CLAVULANATE 875-125 MG PO TABS
1.0000 | ORAL_TABLET | Freq: Two times a day (BID) | ORAL | 0 refills | Status: DC
Start: 2020-11-14 — End: 2021-03-01

## 2020-11-14 NOTE — Progress Notes (Signed)
   Subjective:    Patient ID: Earl Castillo, male    DOB: December 04, 1940, 80 y.o.   MRN: [id number]  HPI Virtual Visit via Telephone Note  I connected with the patient on 11/14/20 at  9:45 AM EST by telephone and verified that I am speaking with the correct person using two identifiers.   I discussed the limitations, risks, security and privacy concerns of performing an evaluation and management service by telephone and the availability of in person appointments. I also discussed with the patient that there may be a patient responsible charge related to this service. The patient expressed understanding and agreed to proceed.  Location patient: home Location provider: work or home office Participants present for the call: patient, provider Patient did not have a visit in the prior 7 days to address this/these issue(s).   History of Present Illness: Here for 2 weeks of sinus pressure, PND, and coughing up yellow sputum. No fever or ST. No SOB or body aches. No NVD.    Observations/Objective: Patient sounds cheerful and well on the phone. I do not appreciate any SOB. Speech and thought processing are grossly intact. Patient reported vitals:  Assessment and Plan: Sinusitis, treat with Augmentin.  Alysia Penna, MD   Follow Up Instructions:     [phone] 5-10 [phone] 11-20 [id number] 21-30 I did not refer this patient for an OV in the next 24 hours for this/these issue(s).  I discussed the assessment and treatment plan with the patient. The patient was provided an opportunity to ask questions and all were answered. The patient agreed with the plan and demonstrated an understanding of the instructions.   The patient was advised to call back or seek an in-person evaluation if the symptoms worsen or if the condition fails to improve as anticipated.  I provided 11 minutes of non-face-to-face time during this encounter.   Alysia Penna, MD    Review of Systems     Objective:   Physical  Exam        Assessment & Plan:

## 2020-11-21 ENCOUNTER — Other Ambulatory Visit: Payer: Self-pay | Admitting: Family Medicine

## 2020-11-21 MED ORDER — HYDROCHLOROTHIAZIDE 25 MG PO TABS: 25.0000 mg | ORAL_TABLET | Freq: Every day | ORAL | 3 refills | Status: DC

## 2020-11-21 MED ORDER — LOSARTAN POTASSIUM 100 MG PO TABS: 100.0000 mg | ORAL_TABLET | Freq: Every day | ORAL | 3 refills | Status: DC

## 2020-11-21 NOTE — Telephone Encounter (Signed)
I sent in the separate prescriptions

## 2020-11-30 ENCOUNTER — Encounter (INDEPENDENT_AMBULATORY_CARE_PROVIDER_SITE_OTHER): Payer: Self-pay | Admitting: Ophthalmology

## 2020-11-30 ENCOUNTER — Ambulatory Visit (INDEPENDENT_AMBULATORY_CARE_PROVIDER_SITE_OTHER): Payer: Medicare Other | Admitting: Ophthalmology

## 2020-11-30 ENCOUNTER — Other Ambulatory Visit: Payer: Self-pay

## 2020-11-30 DIAGNOSIS — H47012 Ischemic optic neuropathy, left eye: Secondary | ICD-10-CM | POA: Diagnosis not present

## 2020-11-30 DIAGNOSIS — D3131 Benign neoplasm of right choroid: Secondary | ICD-10-CM | POA: Diagnosis not present

## 2020-11-30 DIAGNOSIS — H4720 Unspecified optic atrophy: Secondary | ICD-10-CM

## 2020-11-30 DIAGNOSIS — Z961 Presence of intraocular lens: Secondary | ICD-10-CM | POA: Diagnosis not present

## 2020-11-30 DIAGNOSIS — H3322 Serous retinal detachment, left eye: Secondary | ICD-10-CM | POA: Diagnosis not present

## 2020-11-30 NOTE — Progress Notes (Signed)
11/30/2020     CHIEF COMPLAINT Patient presents for Retina Evaluation (WIP - POSS RD OS - Ref'd by Dr. Janina Mayo c/o line/curtain over VA OS inferiorly worsening over time x 1 month "or maybe even longer." Pt sts he "just woke up like that" one morning OS. Pt denies ocular pain. Pt denies any floaters of flashes of light OU. Pt sts VA blurry OD, and OS is "affecting" the OD.)   HISTORY OF PRESENT ILLNESS: Earl Castillo is a 80 y.o. male who presents to the clinic today for:   HPI    Retina Evaluation    In left eye.  This started 1 month ago.  Duration of 1 month.  Context:  distance vision, mid-range vision and near vision.  Treatments tried include no treatments.  Response to treatment was no improvement. Additional comments: WIP - POSS RD OS - Ref'd by Dr. [name]  Pt c/o line/curtain over VA OS inferiorly worsening over time x 1 month "or maybe even longer." Pt sts he "just woke up like that" one morning OS. Pt denies ocular pain. Pt denies any floaters of flashes of light OU. Pt sts VA blurry OD, and OS is "affecting" the OD.       Last edited by Rockie Neighbours, [place] on 11/30/2020  3:47 PM. (History)      Referring physician: [name], MD [place],  [place] [id number]  HISTORICAL INFORMATION:   Selected notes from the [place]: No current outpatient medications on file. (Ophthalmic Drugs)   No current facility-administered medications for this visit. (Ophthalmic Drugs)   Current Outpatient Medications (Other)  Medication Sig  . acetaminophen (TYLENOL) 500 MG tablet Take 1,000 mg by mouth daily as needed (for leg pain).   Marland Kitchen amoxicillin-clavulanate (AUGMENTIN) 875-125 MG tablet Take 1 tablet by mouth 2 (two) times daily.  Marland Kitchen aspirin EC 81 MG tablet Take 81 mg by mouth every evening.   . clopidogrel (PLAVIX) 75 MG tablet TAKE 1 TABLET BY MOUTH EVERY DAY  . Cyanocobalamin (B-12) 1000 MCG SUBL Place 1 tablet  under the tongue daily.  Marland Kitchen docusate sodium (COLACE) 100 MG capsule Take 100 mg by mouth daily as needed (for constipation).   . ferrous gluconate (FERGON) 324 MG tablet TAKE 1 TABLET BY MOUTH EVERY DAY WITH BREAKFAST. SCHEDULE OFFICE VISIT FOR ADDITIONAL REFILLS  . hydrochlorothiazide (HYDRODIURIL) 25 MG tablet Take 1 tablet (25 mg total) by mouth daily.  . isosorbide mononitrate (IMDUR) 30 MG 24 hr tablet TAKE 1 TABLET (30 MG TOTAL) BY MOUTH DAILY. NEED OV.  Marland Kitchen losartan (COZAAR) 100 MG tablet Take 1 tablet (100 mg total) by mouth daily.  . metoprolol tartrate (LOPRESSOR) 50 MG tablet Take 1 tablet (50 mg total) by mouth daily.  Marland Kitchen NIFEdipine (ADALAT CC) 90 MG 24 hr tablet TAKE 1 TABLET BY MOUTH EVERY DAY  . simvastatin (ZOCOR) 20 MG tablet Take 1 tablet (20 mg total) by mouth daily at 6 PM. NEED OV.   No current facility-administered medications for this visit. (Other)      REVIEW OF SYSTEMS:    ALLERGIES No Known Allergies  PAST MEDICAL HISTORY Past Medical History:  Diagnosis Date  . Anemia   . Angiodysplasia of intestine - small bowel  09/21/2013  . Blood transfusion abn reaction or complication, no procedure mishap    "HgB dropped real low"  . Cancer [facility])    Skin cancers  .  Carotid artery disease ([place])    status post carotid endarterectomy by [name]  . Chicken pox   . Colon polyps 08/2012   Hyperplastic and adenomatous sessile polyps  . Coronary artery disease    CABG'04, Low risk Myoview 2013  . GERD (gastroesophageal reflux disease)   . Hemorrhoid   . History of blood transfusion 08/2013   "HgB dropped real low; related to bleeding from my rectum"  . History of kidney stones    1 removed surgical, passed 2  . Hyperlipidemia   . Hypertension   . 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 Lower GI bleeding 08/2013  . Osteoarthritis    "hips" (05/23/2014)  . Peripheral arterial disease ([place])     status post left to right fem-fem crossover graft in by [name]  . Pneumonia    - "years ago"  . Sinus headache    "alot" (05/23/2014)  . Skin exam, screening for cancer    sees [name]   . Vitamin B12 deficiency 11/23/2013   Past Surgical History:  Procedure Laterality Date  . BAND HEMORRHOIDECTOMY    . BASAL CELL CARCINOMA EXCISION  May 2009   per [name] , left nose   . basket extraction of ureteral stone  1970's  . Bypass rt leg Right 2000  . capsule endoscopy  10-05-13   per [name], 4 AVM's  . CARDIAC CATHETERIZATION  08/18/2003   Recommend CABG  . CARDIOVASCULAR STRESS TEST - LEXISCAN  08/25/2012   No significant wall motion abnormalities  . CAROTID ANGIOGRAM N/A 03/17/2014   Procedure: CAROTID ANGIOGRAM;  Surgeon: [name], MD;  Location: [facility] CATH LAB;  Service: Cardiovascular;  Laterality: N/A;  right carotid  . CAROTID ENDARTERECTOMY Right   . CAROTID ENDARTERECTOMY Right 03/26/2005   Dr Georgina Quint  . CATARACT EXTRACTION W/ INTRAOCULAR LENS  IMPLANT, BILATERAL Bilateral 2014   per [name]  . [place]   "bulging disc"  . COLONOSCOPY  08-12-12   per [name], tubular adenomas, repeat in 5 yrs   . CORONARY ARTERY BYPASS GRAFT  08/30/2003   x3. LIMA-distal LAD, RIMA-circumflex, SVG-distal RCA  . CYSTOSCOPY W/ STONE MANIPULATION    . ESOPHAGOGASTRODUODENOSCOPY N/A 08/17/2013   Procedure: ESOPHAGOGASTRODUODENOSCOPY (EGD);  Surgeon: [name], MD;  Location: [facility] ENDOSCOPY;  Service: Endoscopy;  Laterality: N/A;  . FEMORAL ARTERY STENT Left 05/23/2014  . FEMORAL-FEMORAL BYPASS GRAFT  2003  . FEMORAL-POPLITEAL BYPASS GRAFT Left 08/09/2016   Procedure: BYPASS GRAFT LEFT FEMORAL-POPLITEAL ARTERY USING GORE 6MM X 80CM PROPATEN GRAFT;  Surgeon: Serafina Mitchell, MD;  Location: [place];  Service: Vascular;  Laterality: Left;  . LOWER EXTREMITY ANGIOGRAM N/A 03/17/2014   Procedure: LOWER EXTREMITY ANGIOGRAM;  Surgeon: [name], MD;  Location: [facility] CATH LAB;  Service: Cardiovascular;  Laterality: N/A;  . PERIPHERAL VASCULAR CATHETERIZATION N/A 06/06/2016   Procedure: Lower Extremity Angiography;  Surgeon: [name], MD;  Location: [place] CV LAB;  Service: Cardiovascular;  Laterality: N/A;  . PERIPHERAL VASCULAR CATHETERIZATION N/A 06/06/2016   Procedure: Abdominal Aortogram;  Surgeon: [name], MD;  Location: [place] CV LAB;  Service: Cardiovascular;  Laterality: N/A;  . RENAL DUPLEX Bilateral 02/02/2013   Bilateral renal arteries demonstrate narrowingconsistent with a 60-99% diameter reduction.  . TRANSTHORACIC ECHOCARDIOGRAM  08/02/2013   EF 55-60%, normal echocardiogram    FAMILY HISTORY Family History  Problem Relation Age of Onset  . Heart disease Father   . Heart disease Sister   . Coronary artery disease Brother        CABG x 6  . Heart disease Brother   . Coronary artery disease Brother        first MI 20, died at 24 during cardiac surgery  . Coronary artery disease Sister        CABG x 5, RAS  . Heart disease Sister   . Colon cancer Neg Hx     SOCIAL HISTORY Social History   Tobacco Use  . Smoking status: Current Some Day Smoker    Packs/day: 0.25    Years: 58.00    Pack years: 14.50    Types: Cigarettes  . Smokeless tobacco: Never Used  . Tobacco comment: rarely smokes now; does have approx 38 pack hx  of smoking/ does now the LDCT   Vaping Use  . Vaping Use: Never used  Substance Use Topics  . Alcohol use: No    Alcohol/week: 0.0 standard drinks  . Drug use: No         OPHTHALMIC EXAM: Base Eye Exam    Visual Acuity (ETDRS)      Right Left   Dist cc 20/50 +1 CF @ 1'   Dist ph cc 20/40 +2 20/200   Correction: Glasses       Tonometry (Tonopen, 3:36 PM)      Right Left   Pressure 13 11       Pupils      Dark Light Shape React APD   Right 6 6 Round Dilated None   Left 6 6 Round Dilated None       Visual Fields (Counting fingers)      Left  Right     Full   Restrictions Total inferior temporal, inferior nasal deficiencies        Extraocular Movement      Right Left    Full Full       Neuro/Psych    Oriented x3: Yes   Mood/Affect: Normal       Dilation    Both eyes: 1.0% Mydriacyl, 2.5% Phenylephrine @ 3:41 PM        Slit Lamp and Fundus Exam    External Exam      Right Left   External Normal Normal       Slit Lamp Exam      Right Left   Lids/Lashes Normal Normal   Conjunctiva/Sclera White and quiet White and quiet   Cornea Clear Clear   Anterior Chamber Deep and quiet Deep and quiet   Iris Round and reactive Round and reactive   Lens Posterior chamber intraocular lens Posterior chamber intraocular lens   Anterior Vitreous Normal Normal       Fundus Exam      Right Left   Posterior Vitreous Posterior vitreous detachment Posterior vitreous detachment, Central vitreous floaters   Disc Normal 2+ Pallor   C/D Ratio 0.2 0.5   Macula Early age related macular degeneration, Hard drusen Early age related macular degeneration, Hard drusen   Vessels Normal Normal   Periphery Normal, no choroidal nevus on the superotemporal arcade as well as temporal to the macula no high risk features Normal          IMAGING AND PROCEDURES  Imaging and Procedures for 11/30/20  Color Fundus Photography Optos - OU - Both Eyes       Right Eye  Progression has improved. Disc findings include normal observations. Macula : normal observations. Vessels : normal observations. Periphery : normal observations.   Left Eye Progression has improved. Disc findings include pallor, increased cup to disc ratio. Macula : normal observations. Vessels : normal observations. Periphery : normal observations.                 ASSESSMENT/PLAN:  Choroidal nevus, right The nature of choroidal nevus was discussed with the patient and an informational form was offered.  Photo documentation was discussed with the patient.  Periodic  follow-up may be needed for a lifetime. The patient's questions were answered. At minimum, annual exams will be needed if no signs of early growth.      ICD-10-CM   1. NAION (non-arteritic anterior ischemic optic neuropathy), left eye  H47.012 Color Fundus Photography Optos - OU - Both Eyes    CBC    Sedimentation rate    C-reactive protein  2. Optic atrophy, left eye  H47.20 Color Fundus Photography Optos - OU - Both Eyes  3. Choroidal nevus, right  D31.31     1.  Apparent nonarteritic anterior ischemic optic neuropathy with secondary optic atrophy of the left eye, neglected to the extent that this began about 1 month ago.  He does say that he lost central vision in reading vision left eye around 3 weeks ago.  There is optic nerve pallor but there is no active optic nerve edema of either the left or the right eye.  Careful examination of the retinal vasculature particularly arteries does not show signs of arterial segmentation nor illusion.  Thus this is not likely arteritic ischemic optic 2.  We will send for laboratory testing CBC sed rate as well as C-reactive protein.  3.  Follow-up in 2 to 3 weeks for evaluation and promptly should any new visual difficulties arise in the right eye the patient should proceed on an emergent basis to the local emergency room  Ophthalmic Meds Ordered this visit:  No orders of the defined types were placed in this encounter.      Return in about 4 weeks (around 12/28/2020) for OCT, DILATE OU.  There are no Patient Instructions on file for this visit.   Explained the diagnoses, plan, and follow up with the patient and they expressed understanding.  Patient expressed understanding of the importance of proper follow up care.   Clent Demark Kamareon Sciandra M.D. Diseases & Surgery of the Retina and Vitreous [facility] [place] 11/30/20     Abbreviations: M myopia (nearsighted); A astigmatism; H hyperopia (farsighted); P presbyopia; Mrx spectacle  prescription;  CTL contact lenses; OD right eye; OS left eye; OU both eyes  XT exotropia; ET esotropia; PEK punctate epithelial keratitis; PEE punctate epithelial erosions; DES dry eye syndrome; MGD meibomian gland dysfunction; ATs artificial tears; PFAT's preservative free artificial tears; Ong nuclear sclerotic cataract; PSC posterior subcapsular cataract; ERM epi-retinal membrane; PVD posterior vitreous detachment; RD retinal detachment; DM diabetes mellitus; DR diabetic retinopathy; NPDR non-proliferative diabetic retinopathy; PDR proliferative diabetic retinopathy; CSME clinically significant macular edema; DME diabetic macular edema; dbh dot blot hemorrhages; CWS cotton wool spot; POAG primary open angle glaucoma; C/D cup-to-disc ratio; HVF humphrey visual field; GVF goldmann visual field; OCT optical coherence tomography; IOP intraocular pressure; BRVO Branch retinal vein occlusion; CRVO central retinal vein occlusion; CRAO central retinal artery occlusion; BRAO branch retinal artery occlusion; RT retinal tear; SB scleral buckle; PPV pars plana vitrectomy; VH Vitreous hemorrhage; PRP panretinal laser photocoagulation;  IVK intravitreal kenalog; VMT vitreomacular traction; MH Macular hole;  NVD neovascularization of the disc; NVE neovascularization elsewhere; AREDS age related eye disease study; ARMD age related macular degeneration; POAG primary open angle glaucoma; EBMD epithelial/anterior basement membrane dystrophy; ACIOL anterior chamber intraocular lens; IOL intraocular lens; PCIOL posterior chamber intraocular lens; Phaco/IOL phacoemulsification with intraocular lens placement; [place] photorefractive keratectomy; LASIK laser assisted in situ keratomileusis; HTN hypertension; DM diabetes mellitus; COPD chronic obstructive pulmonary disease

## 2020-11-30 NOTE — Assessment & Plan Note (Signed)
The nature of choroidal nevus was discussed with the patient and an informational form was offered.  Photo documentation was discussed with the patient.  Periodic follow-up may be needed for a lifetime. The patient's questions were answered. At minimum, annual exams will be needed if no signs of early growth. 

## 2020-12-01 ENCOUNTER — Other Ambulatory Visit (INDEPENDENT_AMBULATORY_CARE_PROVIDER_SITE_OTHER): Payer: Medicare Other

## 2020-12-01 ENCOUNTER — Other Ambulatory Visit: Payer: Self-pay | Admitting: Family Medicine

## 2020-12-01 ENCOUNTER — Telehealth: Payer: Self-pay | Admitting: Family Medicine

## 2020-12-01 ENCOUNTER — Other Ambulatory Visit: Payer: Self-pay

## 2020-12-01 DIAGNOSIS — H47012 Ischemic optic neuropathy, left eye: Secondary | ICD-10-CM

## 2020-12-01 LAB — CBC WITH DIFFERENTIAL/PLATELET
Basophils Absolute: 0 10*3/uL (ref 0.0–0.1)
Basophils Relative: 0.4 % (ref 0.0–3.0)
Eosinophils Absolute: 0.1 10*3/uL (ref 0.0–0.7)
Eosinophils Relative: 0.8 % (ref 0.0–5.0)
HCT: 42.7 % (ref 39.0–52.0)
Hemoglobin: 14.8 g/dL (ref 13.0–17.0)
Lymphocytes Relative: 21.3 % (ref 12.0–46.0)
Lymphs Abs: 1.6 10*3/uL (ref 0.7–4.0)
MCHC: 34.6 g/dL (ref 30.0–36.0)
MCV: 97.9 fl (ref 78.0–100.0)
Monocytes Absolute: 0.5 10*3/uL (ref 0.1–1.0)
Monocytes Relative: 6.8 % (ref 3.0–12.0)
Neutro Abs: 5.3 10*3/uL (ref 1.4–7.7)
Neutrophils Relative %: 70.7 % (ref 43.0–77.0)
Platelets: 134 10*3/uL — ABNORMAL LOW (ref 150.0–400.0)
RBC: 4.36 Mil/uL (ref 4.22–5.81)
RDW: 13.8 % (ref 11.5–15.5)
WBC: 7.5 10*3/uL (ref 4.0–10.5)

## 2020-12-01 LAB — SEDIMENTATION RATE: Sed Rate: 14 mm/hr (ref 0–20)

## 2020-12-01 LAB — C-REACTIVE PROTEIN: CRP: <1 mg/dL (ref 0.5–20.0)

## 2020-12-01 NOTE — Progress Notes (Signed)
Per Dr. Sarajane Jews ok to reorder CRP, Sed Rate, CBC and CC Dr. Deloria Lair with Dx: NAION H47.012/created order/thx dmf

## 2020-12-04 NOTE — Progress Notes (Signed)
CBC dated 12/01/2020 is normal.  No elevated white count.  CRP was less than 1, thus normal. Erythrocyte sedimentation rate was normal at 14.  No signs of arteritic disease  The findings of nonarteritic anterior ischemic optic neuropathy are supported

## 2020-12-05 NOTE — Telephone Encounter (Signed)
Done

## 2020-12-14 ENCOUNTER — Other Ambulatory Visit: Payer: Self-pay | Admitting: Cardiovascular Disease

## 2020-12-15 ENCOUNTER — Other Ambulatory Visit: Payer: Self-pay | Admitting: Family Medicine

## 2020-12-26 ENCOUNTER — Other Ambulatory Visit: Payer: Self-pay

## 2020-12-26 ENCOUNTER — Ambulatory Visit (INDEPENDENT_AMBULATORY_CARE_PROVIDER_SITE_OTHER): Payer: Medicare Other | Admitting: Cardiovascular Disease

## 2020-12-26 ENCOUNTER — Encounter: Payer: Self-pay | Admitting: Cardiovascular Disease

## 2020-12-26 DIAGNOSIS — I739 Peripheral vascular disease, unspecified: Secondary | ICD-10-CM

## 2020-12-26 DIAGNOSIS — E782 Mixed hyperlipidemia: Secondary | ICD-10-CM | POA: Diagnosis not present

## 2020-12-26 DIAGNOSIS — I6523 Occlusion and stenosis of bilateral carotid arteries: Secondary | ICD-10-CM | POA: Diagnosis not present

## 2020-12-26 DIAGNOSIS — F17200 Nicotine dependence, unspecified, uncomplicated: Secondary | ICD-10-CM

## 2020-12-26 DIAGNOSIS — I10 Essential (primary) hypertension: Secondary | ICD-10-CM

## 2020-12-26 DIAGNOSIS — I2510 Atherosclerotic heart disease of native coronary artery without angina pectoris: Secondary | ICD-10-CM

## 2020-12-26 NOTE — Assessment & Plan Note (Signed)
History of essential hypertension a blood pressure measured today 146/70.  He is on losartan and metoprolol as well as nifedipine.

## 2020-12-26 NOTE — Assessment & Plan Note (Signed)
History of CAD status post CABG in 2004 by Dr. Dorian Pod the LIMA to his LAD, RIMA to the circumflex marginal and a vein to the RCA.  He had a Myoview performed 08/23/2012 which is low risk and nonischemic.  He denies chest pain or shortness of breath.

## 2020-12-26 NOTE — Assessment & Plan Note (Signed)
History of peripheral arterial disease status post left to right femorofemoral crossover grafting by [name] May 2000.  He had a occluded left SFA and underwent elective left femoropopliteal bypass grafting with PTFE by Dr. [name] 08/09/2016 because of critical limb ischemia.  This graft was shown to be occluded by Doppler study 05/24/2020.  He currently denies claudication.

## 2020-12-26 NOTE — Progress Notes (Signed)
12/26/2020 Earl Castillo   1941/11/03  [id number]  Primary Physician [name], MD Primary Cardiologist: [name] MD Earl Castillo, [place]  HPI:  Earl Castillo is a 80 y.o. Caucasian malepreviously followed by [name] and now followed by myself. I last  saw him in the office 06/06/2020.Marland Kitchen  Hiswife of 53 years unfortunately passed away 06-02-2017. He is living with hisyoungestson currently.The last time I saw him was at the time of his intervention in 06/03/23. His past medical history is significant for CAD, status post coronary artery bypass grafting in 2004 by [name], with a LIMA to his LAD, RIMA to the circumflex marginal and SVG to the distal RCA. He had a Myoview stress test performed 08/23/2012 which was low risk and nonischemic. He also has hypertension and hyperlipidemia. He has a 60+ year history of tobacco use and continues to smoke daily. He has significant peripheral vascular disease. He is status post right carotid endarterectomy performed by [name] in 2006. We have been following his carotid disease yearly by duplex ultrasound. He has also had a left to right fem-fem crossover graft by [name] in May of 2000. He was recently seen by [name] and complained of worsening claudication. Recent lower extremity arterial Doppler studies demonstrated progression of disease in his left common and external iliac arteries. In addition, it was suggested that he had moderately severe distal left SFA disease. Subsequently, on 03/17/2014 he underwent a diagnostic PV angiogram performed by [name]. His fem-fem crossover graft was widely patent. His left iliac also appeared widely patent, suggesting falsely elevated velocities by duplex. He did have a high grade focal calcified mid to distal left SFA stenosis corresponding to an area of abnormality and high velocity in the duplex ultrasound with three-vessel runoff. I performed diamondback orbital rotational  atherectomy of his mid left SFA through his femorofemoral crossover graft 05/23/14 with stenting of his proximal left SFA as well. His subsequent Dopplers revealed an increase in his left ABI from 0.8-1.0 . His claudication has resolved. Since I saw him in the office 03/07/16 he denies chest pain. He did have fairly new onset left lower extremity claudication over the last several months with recent Dopplers that showed decline in his left ABI down to 0.4 with an occluded left SFA. I performed angiography on him 06/06/16 revealing a widely patent left iliac system, left to right femorofemoral crossover graft with an occluded left SFA at the origin reconstituting in the adductor canal by profunda femoris collaterals with three-vessel runoff. He also underwent elective left above-the-knee femoropopliteal bypass grafting with a 6 dominant external ring PTFE graft by Dr. [name] 08/09/16. His wounds are healing well. His claudication is improved. He did have some swelling of his lower extremity which she is treating by leg elevation.  Since I  saw him in the office 6 months ago he continues to do well.  He no longer complains of claudication.  He denies chest pain or shortness of breath.  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 Zadie Rhine.  His carotid Doppler studies performed Jun 02, 2020 revealed moderate left ICA stenosis.   Current Meds  Medication Sig  . acetaminophen (TYLENOL) 500 MG tablet Take 1,000 mg by mouth daily as needed (for leg pain).   Marland Kitchen amoxicillin-clavulanate (AUGMENTIN) 875-125 MG tablet Take 1 tablet by mouth 2 (two) times daily.  Marland Kitchen aspirin EC 81 MG tablet Take 81 mg  by mouth every evening.   . clopidogrel (PLAVIX) 75 MG tablet TAKE 1 TABLET BY MOUTH EVERY DAY  . Cyanocobalamin (B-12) 1000 MCG SUBL Place 1 tablet under the tongue daily.  Marland Kitchen docusate sodium (COLACE) 100 MG capsule Take 100 mg by mouth daily as needed (for constipation).   . ferrous gluconate (FERGON) 324 MG tablet  TAKE 1 TABLET BY MOUTH EVERY DAY WITH BREAKFAST.  . hydrochlorothiazide (HYDRODIURIL) 25 MG tablet Take 1 tablet (25 mg total) by mouth daily.  . isosorbide mononitrate (IMDUR) 30 MG 24 hr tablet TAKE 1 TABLET (30 MG TOTAL) BY MOUTH DAILY. NEED OV.  Marland Kitchen losartan (COZAAR) 100 MG tablet Take 1 tablet (100 mg total) by mouth daily.  . metoprolol tartrate (LOPRESSOR) 50 MG tablet Take 1 tablet (50 mg total) by mouth daily.  Marland Kitchen NIFEdipine (ADALAT CC) 90 MG 24 hr tablet TAKE 1 TABLET BY MOUTH EVERY DAY  . simvastatin (ZOCOR) 20 MG tablet Take 1 tablet (20 mg total) by mouth daily at 6 PM. NEED OV.     No Known Allergies  Social History   Socioeconomic History  . Marital status: Married    Spouse name: Not on file  . Number of children: 2  . Years of education: Not on file  . Highest education level: Not on file  Occupational History  . Occupation:  Semi Retired Geophysicist/field seismologist  Tobacco Use  . Smoking status: Current Some Day Smoker    Packs/day: 0.25    Years: 58.00    Pack years: 14.50    Types: Cigarettes  . Smokeless tobacco: Never Used  . Tobacco comment: rarely smokes now; does have approx 38 pack hx  of smoking/ does now the LDCT   Vaping Use  . Vaping Use: Never used  Substance and Sexual Activity  . Alcohol use: No    Alcohol/week: 0.0 standard drinks  . Drug use: No  . Sexual activity: Not Currently  Other Topics Concern  . Not on file  Social History Narrative   Daily caffeine    Social Determinants of Health   Financial Resource Strain: Low Risk   . Difficulty of Paying Living Expenses: Not hard at all  Food Insecurity: No Food Insecurity  . Worried About Charity fundraiser in the Last Year: Never true  . Ran Out of Food in the Last Year: Never true  Transportation Needs: No Transportation Needs  . Lack of Transportation (Medical): No  . Lack of Transportation (Non-Medical): No  Physical Activity: Insufficiently Active  . Days of Exercise per Week: 3 days  . Minutes of  Exercise per Session: 20 min  Stress: No Stress Concern Present  . Feeling of Stress : Not at all  Social Connections: Moderately Isolated  . Frequency of Communication with Friends and Family: More than three times a week  . Frequency of Social Gatherings with Friends and Family: More than three times a week  . Attends Religious Services: 1 to 4 times per year  . Active Member of Clubs or Organizations: No  . Attends Archivist Meetings: Never  . Marital Status: Widowed  Intimate Partner Violence: Not At Risk  . Fear of Current or Ex-Partner: No  . Emotionally Abused: No  . Physically Abused: No  . Sexually Abused: No     Review of Systems: General: negative for chills, fever, night sweats or weight changes.  Cardiovascular: negative for chest pain, dyspnea on exertion, edema, orthopnea, palpitations, paroxysmal nocturnal dyspnea or shortness  of breath Dermatological: negative for rash Respiratory: negative for cough or wheezing Urologic: negative for hematuria Abdominal: negative for nausea, vomiting, diarrhea, bright red blood per rectum, melena, or hematemesis Neurologic: negative for visual changes, syncope, or dizziness All other systems reviewed and are otherwise negative except as noted above.    Blood pressure (!) 146/70, pulse 61, height 5' 9.5" (1.765 m), weight 126 lb (57.2 kg).  General appearance: alert and no distress Neck: no adenopathy, no JVD, supple, symmetrical, trachea midline, thyroid not enlarged, symmetric, no tenderness/mass/nodules and Bilateral carotid bruits Lungs: clear to auscultation bilaterally Heart: regular rate and rhythm, S1, S2 normal, no murmur, click, rub or gallop Extremities: extremities normal, atraumatic, no cyanosis or edema Pulses: 2+ and symmetric Diminished pedal pulses Skin: Skin color, texture, turgor normal. No rashes or lesions Neurologic: Alert and oriented X 3, normal strength and tone. Normal symmetric reflexes.  Normal coordination and gait  EKG sinus rhythm at 61 with LVH voltage and nonspecific ST and T wave changes.  Personally reviewed this EKG.  ASSESSMENT AND PLAN:   Hyperlipidemia History of hyperlipidemia on statin therapy.  We will recheck a lipid liver profile.  Current smoker History of ongoing tobacco abuse of third of a pack a day recalcitrant to risk factor modification.  Essential hypertension History of essential hypertension a blood pressure measured today 146/70.  He is on losartan and metoprolol as well as nifedipine.  Carotid artery disease History of carotid artery disease status post remote right carotid endarterectomy by Dr. Deon Pilling in 2006.  Recent Doppler studies performed 05/24/2020 revealed a widely patent right carotid endarterectomy site with moderate left ICA stenosis.  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  I am referring him back to Dr. [name] for further evaluation.  PAD (peripheral artery disease) (HCC) History of peripheral arterial disease status post left to right femorofemoral crossover grafting by [name] May 2000.  He had a occluded left SFA and underwent elective left femoropopliteal bypass grafting with PTFE by Dr. [name] 08/09/2016 because of critical limb ischemia.  This graft was shown to be occluded by Doppler study 05/24/2020.  He currently denies claudication.  Coronary atherosclerosis History of CAD status post CABG in 2004 by Dr. Dorian Pod the LIMA to his LAD, RIMA to the circumflex marginal and a vein to the RCA.  He had a Myoview performed 08/23/2012 which is low risk and nonischemic.  He denies chest pain or shortness of breath.      [name] MD FACP,FACC,FAHA, [facility] 12/26/2020 2:54 PM

## 2020-12-26 NOTE — Assessment & Plan Note (Signed)
History of carotid artery disease status post remote right carotid endarterectomy by Dr. Deon Pilling in 2006.  Recent Doppler studies performed 05/24/2020 revealed a widely patent right carotid endarterectomy site with moderate left ICA stenosis.  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  I am referring him back to Dr. [name] for further evaluation.

## 2020-12-26 NOTE — Patient Instructions (Signed)
Medication Instructions:  Your physician recommends that you continue on your current medications as directed. Please refer to the Current Medication list given to you today.  *If you need a refill on your cardiac medications before your next appointment, please call your pharmacy*   Lab Work: Your physician recommends that you return for lab work in: 1-2 weeks for lipid/liver profile.   If you have labs (blood work) drawn today and your tests are completely normal, you will receive your results only by: Marland Kitchen MyChart Message (if you have MyChart) OR . A paper copy in the mail If you have any lab test that is abnormal or we need to change your treatment, we will call you to review the results.   Follow-Up: At [facility], you and your health needs are our priority.  As part of our continuing mission to provide you with exceptional heart care, we have created designated Provider Care Teams.  These Care Teams include your primary Cardiologist (physician) and Advanced Practice Providers (APPs -  Physician Assistants and Nurse Practitioners) who all work together to provide you with the care you need, when you need it.  We recommend signing up for the patient portal called "MyChart".  Sign up information is provided on this After Visit Summary.  MyChart is used to connect with patients for Virtual Visits (Telemedicine).  Patients are able to view lab/test results, encounter notes, upcoming appointments, etc.  Non-urgent messages can be sent to your provider as well.   To learn more about what you can do with MyChart, go to [email or web address].    Your next appointment:   12 month(s)  The format for your next appointment:   In Person  Provider:   Quay Burow, MD  Other Instructions Referral made of Dr. [name] at [facility] surgery office.

## 2020-12-26 NOTE — Assessment & Plan Note (Signed)
History of ongoing tobacco abuse of third of a pack a day recalcitrant to risk factor modification.

## 2020-12-26 NOTE — Assessment & Plan Note (Signed)
History of hyperlipidemia on statin therapy.  We will recheck a lipid liver profile 

## 2020-12-28 ENCOUNTER — Other Ambulatory Visit: Payer: Self-pay

## 2020-12-28 ENCOUNTER — Encounter (INDEPENDENT_AMBULATORY_CARE_PROVIDER_SITE_OTHER): Payer: Self-pay | Admitting: Ophthalmology

## 2020-12-28 ENCOUNTER — Ambulatory Visit (INDEPENDENT_AMBULATORY_CARE_PROVIDER_SITE_OTHER): Payer: Medicare Other | Admitting: Ophthalmology

## 2020-12-28 DIAGNOSIS — I6523 Occlusion and stenosis of bilateral carotid arteries: Secondary | ICD-10-CM

## 2020-12-28 DIAGNOSIS — H47012 Ischemic optic neuropathy, left eye: Secondary | ICD-10-CM | POA: Diagnosis not present

## 2020-12-28 NOTE — Progress Notes (Signed)
12/28/2020     CHIEF COMPLAINT Patient presents for Retina Follow Up (4 Week f\u OU. OCT/Pt states vision has slightly improved. Pt states there is a brown curtain over inferior half of OS vision.)   HISTORY OF PRESENT ILLNESS: Earl Castillo is a 80 y.o. male who presents to the clinic today for:   HPI    Retina Follow Up    Diagnosis: NAION.  In left eye.  Severity is moderate.  Duration of 4 weeks.  Since onset it is stable.  I, the attending physician,  performed the HPI with the patient and updated documentation appropriately. Additional comments: 4 Week f\u OU. OCT Pt states vision has slightly improved. Pt states there is a brown curtain over inferior half of OS vision.       Last edited by Tilda Franco on 12/28/2020  2:40 PM. (History)      Referring physician: [name], MD [place],  [place] [id number]  HISTORICAL INFORMATION:   Selected notes from the MEDICAL RECORD NUMBER       CURRENT MEDICATIONS: No current outpatient medications on file. (Ophthalmic Drugs)   No current facility-administered medications for this visit. (Ophthalmic Drugs)   Current Outpatient Medications (Other)  Medication Sig  . acetaminophen (TYLENOL) 500 MG tablet Take 1,000 mg by mouth daily as needed (for leg pain).   Marland Kitchen amoxicillin-clavulanate (AUGMENTIN) 875-125 MG tablet Take 1 tablet by mouth 2 (two) times daily.  Marland Kitchen aspirin EC 81 MG tablet Take 81 mg by mouth every evening.   . clopidogrel (PLAVIX) 75 MG tablet TAKE 1 TABLET BY MOUTH EVERY DAY  . Cyanocobalamin (B-12) 1000 MCG SUBL Place 1 tablet under the tongue daily.  Marland Kitchen docusate sodium (COLACE) 100 MG capsule Take 100 mg by mouth daily as needed (for constipation).   . ferrous gluconate (FERGON) 324 MG tablet TAKE 1 TABLET BY MOUTH EVERY DAY WITH BREAKFAST.  . hydrochlorothiazide (HYDRODIURIL) 25 MG tablet Take 1 tablet (25 mg total) by mouth daily.  . isosorbide mononitrate (IMDUR) 30 MG 24 hr tablet  TAKE 1 TABLET (30 MG TOTAL) BY MOUTH DAILY. NEED OV.  Marland Kitchen losartan (COZAAR) 100 MG tablet Take 1 tablet (100 mg total) by mouth daily.  . metoprolol tartrate (LOPRESSOR) 50 MG tablet Take 1 tablet (50 mg total) by mouth daily.  Marland Kitchen NIFEdipine (ADALAT CC) 90 MG 24 hr tablet TAKE 1 TABLET BY MOUTH EVERY DAY  . simvastatin (ZOCOR) 20 MG tablet Take 1 tablet (20 mg total) by mouth daily at 6 PM. NEED OV.   No current facility-administered medications for this visit. (Other)      REVIEW OF SYSTEMS:    ALLERGIES No Known Allergies  PAST MEDICAL HISTORY Past Medical History:  Diagnosis Date  . Anemia   . Angiodysplasia of intestine - small bowel  09/21/2013  . Blood transfusion abn reaction or complication, no procedure mishap    "HgB dropped real low"  . Cancer [facility])    Skin cancers  . Carotid artery disease ([place])    status post carotid endarterectomy by [name]  . Chicken pox   . Colon polyps 08/2012   Hyperplastic and adenomatous sessile polyps  . Coronary artery disease    CABG'04, Low risk Myoview 2013  . GERD (gastroesophageal reflux disease)   . Hemorrhoid   . History of blood transfusion 08/2013   "HgB dropped real low; related to bleeding from my rectum"  . History of kidney  stones    1 removed surgical, passed 2  . Hyperlipidemia   . Hypertension   . 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 Lower GI bleeding 08/2013  . Osteoarthritis    "hips" (05/23/2014)  . Peripheral arterial disease ([place])    status post left to right fem-fem crossover graft in by [name]  . Pneumonia    - "years ago"  . Sinus headache    "alot" (05/23/2014)  . Skin exam, screening for cancer    sees [name]   . Vitamin B12 deficiency 11/23/2013   Past Surgical History:  Procedure Laterality Date  . BAND HEMORRHOIDECTOMY    . BASAL CELL CARCINOMA EXCISION  May 2009   per [name] , left nose   . basket  extraction of ureteral stone  1970's  . Bypass rt leg Right 2000  . capsule endoscopy  10-05-13   per [name], 4 AVM's  . CARDIAC CATHETERIZATION  08/18/2003   Recommend CABG  . CARDIOVASCULAR STRESS TEST - LEXISCAN  08/25/2012   No significant wall motion abnormalities  . CAROTID ANGIOGRAM N/A 03/17/2014   Procedure: CAROTID ANGIOGRAM;  Surgeon: [name], MD;  Location[facility] CATH LAB;  Service: Cardiovascular;  Laterality: N/A;  right carotid  . CAROTID ENDARTERECTOMY Right   . CAROTID ENDARTERECTOMY Right 03/26/2005   Dr Georgina Quint  . CATARACT EXTRACTION W/ INTRAOCULAR LENS  IMPLANT, BILATERAL Bilateral 2014   per [name]  . [place]   "bulging disc"  . COLONOSCOPY  08-12-12   per [name], tubular adenomas, repeat in 5 yrs   . CORONARY ARTERY BYPASS GRAFT  08/30/2003   x3. LIMA-distal LAD, RIMA-circumflex, SVG-distal RCA  . CYSTOSCOPY W/ STONE MANIPULATION    . ESOPHAGOGASTRODUODENOSCOPY N/A 08/17/2013   Procedure: ESOPHAGOGASTRODUODENOSCOPY (EGD);  Surgeon: [name], MD;  Location: [facility] ENDOSCOPY;  Service: Endoscopy;  Laterality: N/A;  . FEMORAL ARTERY STENT Left 05/23/2014  . FEMORAL-FEMORAL BYPASS GRAFT  2003  . FEMORAL-POPLITEAL BYPASS GRAFT Left 08/09/2016   Procedure: BYPASS GRAFT LEFT FEMORAL-POPLITEAL ARTERY USING GORE 6MM X 80CM PROPATEN GRAFT;  Surgeon: Serafina Mitchell, MD;  Location: [place];  Service: Vascular;  Laterality: Left;  . LOWER EXTREMITY ANGIOGRAM N/A 03/17/2014   Procedure: LOWER EXTREMITY ANGIOGRAM;  Surgeon: [name], MD;  Location: [facility] CATH LAB;  Service: Cardiovascular;  Laterality: N/A;  . PERIPHERAL VASCULAR CATHETERIZATION N/A 06/06/2016   Procedure: Lower Extremity Angiography;  Surgeon: [name], MD;  Location: [place] CV LAB;  Service: Cardiovascular;  Laterality: N/A;  . PERIPHERAL VASCULAR CATHETERIZATION N/A 06/06/2016   Procedure: Abdominal Aortogram;  Surgeon: [name], MD;  Location:  [place] CV LAB;  Service: Cardiovascular;  Laterality: N/A;  . RENAL DUPLEX Bilateral 02/02/2013   Bilateral renal arteries demonstrate narrowingconsistent with a 60-99% diameter reduction.  . TRANSTHORACIC ECHOCARDIOGRAM  08/02/2013   EF 55-60%, normal echocardiogram    FAMILY HISTORY Family History  Problem Relation Age of Onset  . Heart disease Father   . Heart disease Sister   . Coronary artery disease Brother        CABG x 6  . Heart disease Brother   . Coronary artery disease Brother        first MI 60, died at 55 during cardiac surgery  . Coronary artery disease Sister        CABG x 5, RAS  .  Heart disease Sister   . Colon cancer Neg Hx     SOCIAL HISTORY Social History   Tobacco Use  . Smoking status: Current Some Day Smoker    Packs/day: 0.25    Years: 58.00    Pack years: 14.50    Types: Cigarettes  . Smokeless tobacco: Never Used  . Tobacco comment: rarely smokes now; does have approx 38 pack hx  of smoking/ does now the LDCT   Vaping Use  . Vaping Use: Never used  Substance Use Topics  . Alcohol use: No    Alcohol/week: 0.0 standard drinks  . Drug use: No         OPHTHALMIC EXAM: Base Eye Exam    Visual Acuity (Snellen - Linear)      Right Left   Dist cc 20/30 -1 CF @ 3'   Dist ph cc  20/200   Correction: Glasses       Tonometry (Tonopen, 2:44 PM)      Right Left   Pressure 13 11       Pupils      Pupils Dark Light Shape React APD   Right PERRL 3 3 Round Minimal None   Left PERRL 3 3 Round Minimal Trace       Visual Fields (Counting fingers)      Left Right    Full Full       Neuro/Psych    Oriented x3: Yes   Mood/Affect: Normal       Dilation    Both eyes: 1.0% Mydriacyl, 2.5% Phenylephrine @ 2:57 PM        Slit Lamp and Fundus Exam    External Exam      Right Left   External Normal Normal       Slit Lamp Exam      Right Left   Lids/Lashes Normal Normal   Conjunctiva/Sclera White and quiet White and quiet   Cornea  Clear Clear   Anterior Chamber Deep and quiet Deep and quiet   Iris Round and reactive Round and reactive   Lens Posterior chamber intraocular lens Posterior chamber intraocular lens   Anterior Vitreous Normal Normal       Fundus Exam      Right Left   Posterior Vitreous Posterior vitreous detachment Posterior vitreous detachment, Central vitreous floaters   Disc Normal 2+ Pallor   C/D Ratio 0.2 0.5   Macula Early age related macular degeneration, Hard drusen Early age related macular degeneration, Hard drusen   Vessels Normal Normal   Periphery Normal, no choroidal nevus on the superotemporal arcade as well as temporal to the macula no high risk features Normal          IMAGING AND PROCEDURES  Imaging and Procedures for 12/28/20  OCT, Retina - OU - Both Eyes       Right Eye Quality was good. Scan locations included subfoveal. Central Foveal Thickness: 276. Progression has been stable. Findings include normal foveal contour, retinal drusen .   Left Eye Quality was good. Scan locations included subfoveal. Central Foveal Thickness: 275. Progression has been stable. Findings include abnormal foveal contour, central retinal atrophy, outer retinal atrophy.   Notes Moderate to intermediate ARMD OU, no signs of active CNVM,  OS with diffuse inner retinal RNFL atrophy from prior AI ON nonarteritic                  ASSESSMENT/PLAN:  NAION (non-arteritic anterior ischemic optic neuropathy), left No change, no  signs of active disease now      ICD-10-CM   1. NAION (non-arteritic anterior ischemic optic neuropathy), left eye  H47.012 OCT, Retina - OU - Both Eyes  2. NAION (non-arteritic anterior ischemic optic neuropathy), left  H47.012     1. Patient instructed to report immediately to the emergency room should he have new onset visual acuity decline, darkening of the vision and or turning dark of the vision in the right eye  2.  3.  Ophthalmic Meds Ordered this  visit:  No orders of the defined types were placed in this encounter.      Return in about 6 months (around 06/27/2021) for DILATE OU, COLOR FP, OCT.  There are no Patient Instructions on file for this visit.   Explained the diagnoses, plan, and follow up with the patient and they expressed understanding.  Patient expressed understanding of the importance of proper follow up care.   Clent Demark Ellsie Violette M.D. Diseases & Surgery of the Retina and Vitreous Retina & Diabetic [place] 12/28/20     Abbreviations: M myopia (nearsighted); A astigmatism; H hyperopia (farsighted); P presbyopia; Mrx spectacle prescription;  CTL contact lenses; OD right eye; OS left eye; OU both eyes  XT exotropia; ET esotropia; PEK punctate epithelial keratitis; PEE punctate epithelial erosions; DES dry eye syndrome; MGD meibomian gland dysfunction; ATs artificial tears; PFAT's preservative free artificial tears; [place] nuclear sclerotic cataract; PSC posterior subcapsular cataract; ERM epi-retinal membrane; PVD posterior vitreous detachment; RD retinal detachment; DM diabetes mellitus; DR diabetic retinopathy; NPDR non-proliferative diabetic retinopathy; PDR proliferative diabetic retinopathy; CSME clinically significant macular edema; DME diabetic macular edema; dbh dot blot hemorrhages; CWS cotton wool spot; POAG primary open angle glaucoma; C/D cup-to-disc ratio; HVF humphrey visual field; GVF goldmann visual field; OCT optical coherence tomography; IOP intraocular pressure; BRVO Branch retinal vein occlusion; CRVO central retinal vein occlusion; CRAO central retinal artery occlusion; BRAO branch retinal artery occlusion; RT retinal tear; SB scleral buckle; PPV pars plana vitrectomy; VH Vitreous hemorrhage; PRP panretinal laser photocoagulation; IVK intravitreal kenalog; VMT vitreomacular traction; MH Macular hole;  NVD neovascularization of the disc; NVE neovascularization elsewhere; AREDS age related eye disease study; ARMD  age related macular degeneration; POAG primary open angle glaucoma; EBMD epithelial/anterior basement membrane dystrophy; ACIOL anterior chamber intraocular lens; IOL intraocular lens; PCIOL posterior chamber intraocular lens; Phaco/IOL phacoemulsification with intraocular lens placement; [place] photorefractive keratectomy; LASIK laser assisted in situ keratomileusis; HTN hypertension; DM diabetes mellitus; COPD chronic obstructive pulmonary disease

## 2020-12-28 NOTE — Assessment & Plan Note (Signed)
No change, no signs of active disease now

## 2021-01-01 DIAGNOSIS — E782 Mixed hyperlipidemia: Secondary | ICD-10-CM | POA: Diagnosis not present

## 2021-01-01 DIAGNOSIS — F17200 Nicotine dependence, unspecified, uncomplicated: Secondary | ICD-10-CM | POA: Diagnosis not present

## 2021-01-01 DIAGNOSIS — I10 Essential (primary) hypertension: Secondary | ICD-10-CM | POA: Diagnosis not present

## 2021-01-02 LAB — HEPATIC FUNCTION PANEL
ALT: 14 IU/L (ref 0–44)
AST: 21 IU/L (ref 0–40)
Albumin: 4.1 g/dL (ref 3.7–4.7)
Alkaline Phosphatase: 70 IU/L (ref 44–121)
Bilirubin Total: 0.4 mg/dL (ref 0.0–1.2)
Bilirubin, Direct: 0.14 mg/dL (ref 0.00–0.40)
Total Protein: 6.7 g/dL (ref 6.0–8.5)

## 2021-01-02 LAB — LIPID PANEL
Chol/HDL Ratio: 2.2 ratio (ref 0.0–5.0)
Cholesterol, Total: 120 mg/dL (ref 100–199)
HDL: 55 mg/dL (ref >39)
LDL Chol Calc (NIH): 49 mg/dL (ref 0–99)
Triglycerides: 78 mg/dL (ref 0–149)
VLDL Cholesterol Cal: 16 mg/dL (ref 5–40)

## 2021-01-11 ENCOUNTER — Other Ambulatory Visit: Payer: Self-pay | Admitting: Cardiovascular Disease

## 2021-01-11 DIAGNOSIS — I25810 Atherosclerosis of coronary artery bypass graft(s) without angina pectoris: Secondary | ICD-10-CM

## 2021-02-07 ENCOUNTER — Other Ambulatory Visit: Payer: Self-pay

## 2021-02-07 DIAGNOSIS — I6523 Occlusion and stenosis of bilateral carotid arteries: Secondary | ICD-10-CM

## 2021-02-26 ENCOUNTER — Emergency Department (HOSPITAL_BASED_OUTPATIENT_CLINIC_OR_DEPARTMENT_OTHER)
Admission: EM | Admit: 2021-02-26 | Discharge: 2021-02-26 | Disposition: A | Discharge status: Home / Self Care | Payer: Medicare Other | Attending: Emergency Medicine | Admitting: Emergency Medicine

## 2021-02-26 ENCOUNTER — Emergency Department (HOSPITAL_BASED_OUTPATIENT_CLINIC_OR_DEPARTMENT_OTHER): Payer: Medicare Other | Admitting: Radiology

## 2021-02-26 ENCOUNTER — Encounter (HOSPITAL_BASED_OUTPATIENT_CLINIC_OR_DEPARTMENT_OTHER): Payer: Self-pay | Admitting: Obstetrics and Gynecology

## 2021-02-26 ENCOUNTER — Ambulatory Visit (HOSPITAL_COMMUNITY): Payer: Medicare Other

## 2021-02-26 ENCOUNTER — Other Ambulatory Visit: Payer: Self-pay

## 2021-02-26 ENCOUNTER — Ambulatory Visit: Payer: Medicare Other | Admitting: Surgery

## 2021-02-26 DIAGNOSIS — Z85828 Personal history of other malignant neoplasm of skin: Secondary | ICD-10-CM | POA: Insufficient documentation

## 2021-02-26 DIAGNOSIS — Z7902 Long term (current) use of antithrombotics/antiplatelets: Secondary | ICD-10-CM | POA: Diagnosis not present

## 2021-02-26 DIAGNOSIS — M25551 Pain in right hip: Secondary | ICD-10-CM | POA: Insufficient documentation

## 2021-02-26 DIAGNOSIS — Z951 Presence of aortocoronary bypass graft: Secondary | ICD-10-CM | POA: Insufficient documentation

## 2021-02-26 DIAGNOSIS — I10 Essential (primary) hypertension: Secondary | ICD-10-CM | POA: Insufficient documentation

## 2021-02-26 DIAGNOSIS — F17210 Nicotine dependence, cigarettes, uncomplicated: Secondary | ICD-10-CM | POA: Diagnosis not present

## 2021-02-26 DIAGNOSIS — Z79899 Other long term (current) drug therapy: Secondary | ICD-10-CM | POA: Insufficient documentation

## 2021-02-26 DIAGNOSIS — I2510 Atherosclerotic heart disease of native coronary artery without angina pectoris: Secondary | ICD-10-CM | POA: Diagnosis not present

## 2021-02-26 DIAGNOSIS — W19XXXA Unspecified fall, initial encounter: Secondary | ICD-10-CM | POA: Insufficient documentation

## 2021-02-26 DIAGNOSIS — S7001XA Contusion of right hip, initial encounter: Secondary | ICD-10-CM | POA: Diagnosis not present

## 2021-02-26 DIAGNOSIS — Z7982 Long term (current) use of aspirin: Secondary | ICD-10-CM | POA: Insufficient documentation

## 2021-02-26 MED ORDER — ACETAMINOPHEN 325 MG PO TABS
650.0000 mg | ORAL_TABLET | Freq: Once | ORAL | Status: AC
  Administered 2021-02-26: 650 mg via ORAL
  Filled 2021-02-26: qty 2

## 2021-02-26 NOTE — Discharge Instructions (Addendum)
It was our pleasure to provide your ER care today - we hope that you feel better.  Take acetaminophen as need.   Follow up with primary care doctor in 1-2 weeks if symptoms fail to improve/resolve.  Return to ER if worse, new symptoms, new or severe pain, or other concern.

## 2021-02-26 NOTE — ED Notes (Signed)
Pt discharged home after verbalizing understanding of discharge instructions; nad noted. 

## 2021-02-26 NOTE — ED Provider Notes (Signed)
[facility] EMERGENCY DEPT Provider Note   CSN: [id number] Arrival date & time: 02/26/21  1020     History Chief Complaint  Patient presents with  . Fall    Earl Castillo is a 80 y.o. male.  Patient s/p fall yesterday. States it felt as if right hip/leg gave way, and he fell onto right hip area. No loc. No faintness or dizziness prior to fall. Since fall, c/o right hip pain, constant, dull, mild-mod, worse w walking. Denies other pain or injury. No headache. No neck or back pain. No other extremity pain or injury. No leg swelling. No numbness/weakness. No anticoag use.   The history is provided by the patient.  Fall Pertinent negatives include no chest pain, no abdominal pain, no headaches and no shortness of breath.       Past Medical History:  Diagnosis Date  . Anemia   . Angiodysplasia of intestine - small bowel  09/21/2013  . Blood transfusion abn reaction or complication, no procedure mishap    "HgB dropped real low"  . Cancer [facility])    Skin cancers  . Carotid artery disease ([place])    status post carotid endarterectomy by [name]  . Chicken pox   . Colon polyps 08/2012   Hyperplastic and adenomatous sessile polyps  . Coronary artery disease    CABG'04, Low risk Myoview 2013  . GERD (gastroesophageal reflux disease)   . Hemorrhoid   . History of blood transfusion 08/2013   "HgB dropped real low; related to bleeding from my rectum"  . History of kidney stones    1 removed surgical, passed 2  . Hyperlipidemia   . Hypertension   . 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 Lower GI bleeding 08/2013  . Osteoarthritis    "hips" (05/23/2014)  . Peripheral arterial disease ([place])    status post left to right fem-fem crossover graft in by [name]  . Pneumonia    - "years ago"  . Sinus headache    "alot" (05/23/2014)  . Skin exam, screening for cancer    sees [name]   .  Vitamin B12 deficiency 11/23/2013    Patient Active Problem List   Diagnosis Date Noted  . NAION (non-arteritic anterior ischemic optic neuropathy), left 12/28/2020  . Choroidal nevus, right 11/30/2020  . PAD (peripheral artery disease) ([place]) 08/09/2016  . GI bleed 06/22/2014  . Current smoker 05/24/2014  . Claudication ([place]) 05/23/2014  . Vitamin B12 deficiency 11/23/2013  . Angiodysplasia of intestine - small bowel  09/21/2013  . PVD- Lt SFA PTA/stent 05/23/14 09/13/2013  . Carotid artery disease ([place]) 09/13/2013  . 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 Personal history of colonic polyps-adenoma 08/17/2012  . NEPHROLITHIASIS, HX OF 09/10/2010  . URTICARIA 08/22/2010  . Coronary atherosclerosis 01/13/2009  . Osteoarthritis 01/13/2009  . CHICKENPOX, HX OF 01/13/2008  . Hyperlipidemia 07/29/2007  . Essential hypertension 07/29/2007    Past Surgical History:  Procedure Laterality Date  . BAND HEMORRHOIDECTOMY    . BASAL CELL CARCINOMA EXCISION  May 2009   per [name] , left nose   . basket extraction of ureteral stone  1970's  . Bypass rt leg Right 2000  . capsule endoscopy  10-05-13   per [name], 4 AVM's  . CARDIAC CATHETERIZATION  08/18/2003   Recommend CABG  . CARDIOVASCULAR STRESS TEST - LEXISCAN  08/25/2012  No significant wall motion abnormalities  . CAROTID ANGIOGRAM N/A 03/17/2014   Procedure: CAROTID ANGIOGRAM;  Surgeon: [name], MD;  Location: [facility] CATH LAB;  Service: Cardiovascular;  Laterality: N/A;  right carotid  . CAROTID ENDARTERECTOMY Right   . CAROTID ENDARTERECTOMY Right 03/26/2005   Dr Georgina Quint  . CATARACT EXTRACTION W/ INTRAOCULAR LENS  IMPLANT, BILATERAL Bilateral 2014   per [name]  . [place]   "bulging disc"  . COLONOSCOPY  08-12-12   per [name], tubular adenomas, repeat in 5 yrs   . CORONARY ARTERY BYPASS GRAFT  08/30/2003   x3.  LIMA-distal LAD, RIMA-circumflex, SVG-distal RCA  . CYSTOSCOPY W/ STONE MANIPULATION    . ESOPHAGOGASTRODUODENOSCOPY N/A 08/17/2013   Procedure: ESOPHAGOGASTRODUODENOSCOPY (EGD);  Surgeon: [name], MD;  Location[facility] ENDOSCOPY;  Service: Endoscopy;  Laterality: N/A;  . FEMORAL ARTERY STENT Left 05/23/2014  . FEMORAL-FEMORAL BYPASS GRAFT  2003  . FEMORAL-POPLITEAL BYPASS GRAFT Left 08/09/2016   Procedure: BYPASS GRAFT LEFT FEMORAL-POPLITEAL ARTERY USING GORE 6MM X 80CM PROPATEN GRAFT;  Surgeon: Serafina Mitchell, MD;  Location: [facility];  Service: Vascular;  Laterality: Left;  . LOWER EXTREMITY ANGIOGRAM N/A 03/17/2014   Procedure: LOWER EXTREMITY ANGIOGRAM;  Surgeon: [name], MD;  Location: [facility] CATH LAB;  Service: Cardiovascular;  Laterality: N/A;  . PERIPHERAL VASCULAR CATHETERIZATION N/A 06/06/2016   Procedure: Lower Extremity Angiography;  Surgeon: [name], MD;  Location: [facility] CV LAB;  Service: Cardiovascular;  Laterality: N/A;  . PERIPHERAL VASCULAR CATHETERIZATION N/A 06/06/2016   Procedure: Abdominal Aortogram;  Surgeon: [name], MD;  Location: [facility] CV LAB;  Service: Cardiovascular;  Laterality: N/A;  . RENAL DUPLEX Bilateral 02/02/2013   Bilateral renal arteries demonstrate narrowingconsistent with a 60-99% diameter reduction.  . TRANSTHORACIC ECHOCARDIOGRAM  08/02/2013   EF 55-60%, normal echocardiogram       Family History  Problem Relation Age of Onset  . Heart disease Father   . Heart disease Sister   . Coronary artery disease Brother        CABG x 6  . Heart disease Brother   . Coronary artery disease Brother        first MI 83, died at 22 during cardiac surgery  . Coronary artery disease Sister        CABG x 5, RAS  . Heart disease Sister   . Colon cancer Neg Hx     Social History   Tobacco Use  . Smoking status: Current Some Day Smoker    Packs/day: 0.25    Years: 58.00    Pack years: 14.50    Types: Cigarettes  . Smokeless  tobacco: Never Used  . Tobacco comment: rarely smokes now; does have approx 38 pack hx  of smoking/ does now the LDCT   Vaping Use  . Vaping Use: Never used  Substance Use Topics  . Alcohol use: No    Alcohol/week: 0.0 standard drinks  . Drug use: No    Home Medications Prior to Admission medications   Medication Sig Start Date End Date Taking? Authorizing Provider  acetaminophen (TYLENOL) 500 MG tablet Take 1,000 mg by mouth daily as needed (for leg pain).     [provider]  amoxicillin-clavulanate (AUGMENTIN) 875-125 MG tablet Take 1 tablet by mouth 2 (two) times daily. 11/14/20   [name], MD  aspirin EC 81 MG tablet Take 81 mg by mouth every evening.  [provider]  clopidogrel (PLAVIX) 75 MG tablet TAKE 1 TABLET BY MOUTH EVERY DAY 08/11/20   [name], MD  Cyanocobalamin (B-12) 1000 MCG SUBL Place 1 tablet under the tongue daily.    [provider]  docusate sodium (COLACE) 100 MG capsule Take 100 mg by mouth daily as needed (for constipation).  08/19/13   Debbe Odea, MD  ferrous gluconate (FERGON) 324 MG tablet TAKE 1 TABLET BY MOUTH EVERY DAY WITH BREAKFAST. 12/15/20   [name], MD  hydrochlorothiazide (HYDRODIURIL) 25 MG tablet Take 1 tablet (25 mg total) by mouth daily. 11/21/20   [name], MD  isosorbide mononitrate (IMDUR) 30 MG 24 hr tablet TAKE 1 TABLET (30 MG TOTAL) BY MOUTH DAILY. NEED OV. 01/11/21   [name], MD  losartan (COZAAR) 100 MG tablet Take 1 tablet (100 mg total) by mouth daily. 11/21/20   [name], MD  metoprolol tartrate (LOPRESSOR) 50 MG tablet Take 1 tablet (50 mg total) by mouth daily. 02/21/20   [name], MD  NIFEdipine (ADALAT CC) 90 MG 24 hr tablet TAKE 1 TABLET BY MOUTH EVERY DAY 12/14/20   [name], MD  simvastatin (ZOCOR) 20 MG tablet Take 1 tablet (20 mg total) by mouth daily at 6 PM. NEED OV. 02/21/20   [name], MD    Allergies    Patient has no known  allergies.  Review of Systems   Review of Systems  Constitutional: Negative for fever.  HENT: Negative for nosebleeds.   Eyes: Negative for pain.  Respiratory: Negative for shortness of breath.   Cardiovascular: Negative for chest pain.  Gastrointestinal: Negative for abdominal pain, nausea and vomiting.  Genitourinary: Negative for flank pain.  Musculoskeletal: Negative for back pain and neck pain.  Skin: Negative for wound.  Neurological: Negative for weakness, numbness and headaches.  Hematological: Does not bruise/bleed easily.  Psychiatric/Behavioral: Negative for confusion.    Physical Exam Updated Vital Signs BP 135/85 (BP Location: Right Arm)   Pulse 60   Temp 98.4 F (36.9 C) (Oral)   Resp 17   Ht 1.689 m (5' 6.5")   Wt 60.3 kg   SpO2 95%   BMI 21.15 kg/m   Physical Exam Vitals and nursing note reviewed.  Constitutional:      Appearance: Normal appearance. He is well-developed.  HENT:     Head: Atraumatic.     Nose: Nose normal.     Mouth/Throat:     Mouth: Mucous membranes are moist.  Eyes:     General: No scleral icterus.    Conjunctiva/sclera: Conjunctivae normal.     Pupils: Pupils are equal, round, and reactive to light.  Neck:     Trachea: No tracheal deviation.  Cardiovascular:     Rate and Rhythm: Normal rate and regular rhythm.     Pulses: Normal pulses.     Heart sounds: Normal heart sounds. No murmur heard. No friction rub. No gallop.   Pulmonary:     Effort: Pulmonary effort is normal. No accessory muscle usage or respiratory distress.     Breath sounds: Normal breath sounds.  Chest:     Chest wall: No tenderness.  Abdominal:     General: There is no distension.     Palpations: Abdomen is soft.     Tenderness: There is no abdominal tenderness.  Genitourinary:    Comments: No cva tenderness. Musculoskeletal:        General: No swelling.  Cervical back: Normal range of motion and neck supple. No rigidity or tenderness.      Comments: CTLS spine, non tender, aligned, no step off. Tenderness right hip, otherwise good rom bil extremities without pain or focal bony tenderness. No RLE swelling. No knee pain or pain w rom. Distal pulses palp.   Skin:    General: Skin is warm and dry.     Findings: No rash.  Neurological:     Mental Status: He is alert.     Comments: Alert, speech clear. Motor intact bil, stre 5/5. Sens grossly intact. Steady gait.   Psychiatric:        Mood and Affect: Mood normal.     ED Results / Procedures / Treatments   Labs (all labs ordered are listed, but only abnormal results are displayed) Labs Reviewed - No data to display  EKG None  Radiology DG HIP UNILAT W OR W/O PELVIS 2-3 VIEWS RIGHT  Result Date: 02/26/2021 CLINICAL DATA:  Right hip pain after fall. EXAM: DG HIP (WITH OR WITHOUT PELVIS) 2-3V RIGHT COMPARISON:  None. FINDINGS: There is no evidence of hip fracture or dislocation. There is no evidence of arthropathy or other focal bone abnormality. Osteopenia. Extensive aortoiliac atherosclerotic vascular disease with surgical clips in both groins and partially visualized left SFA stent. IMPRESSION: 1. No acute osseous abnormality. Electronically Signed   By: Titus Dubin M.D.   On: 02/26/2021 11:01    Procedures Procedures   Medications Ordered in ED Medications - No data to display  ED Course  I have reviewed the triage vital signs and the nursing notes.  Pertinent labs & imaging results that were available during my care of the patient were reviewed by me and considered in my medical decision making (see chart for details).    MDM Rules/Calculators/A&P                         Xrays ordered.  Reviewed nursing notes and prior charts for additional history.   Acetaminophen po.  Xrays reviewed/interpreted by me - no fx.    Final Clinical Impression(s) / ED Diagnoses Final diagnoses:  None    Rx / DC Orders ED Discharge Orders    None       Lajean Saver, MD 02/26/21 1142

## 2021-02-26 NOTE — ED Triage Notes (Signed)
Patient reports to the ER after a fall yesterday. Patient reports his right leg 'gave way' underneath him and he fell and hurt his right hip. Patient denies LOC.

## 2021-02-28 ENCOUNTER — Emergency Department (HOSPITAL_COMMUNITY)
Admission: EM | Admit: 2021-02-28 | Discharge: 2021-03-02 | Disposition: A | Discharge status: Home / Self Care | Payer: Medicare Other | Attending: Emergency Medicine | Admitting: Emergency Medicine

## 2021-02-28 ENCOUNTER — Encounter (HOSPITAL_COMMUNITY): Payer: Self-pay

## 2021-02-28 ENCOUNTER — Emergency Department (HOSPITAL_COMMUNITY): Payer: Medicare Other

## 2021-02-28 ENCOUNTER — Other Ambulatory Visit: Payer: Self-pay

## 2021-02-28 DIAGNOSIS — Z79899 Other long term (current) drug therapy: Secondary | ICD-10-CM | POA: Insufficient documentation

## 2021-02-28 DIAGNOSIS — Z7902 Long term (current) use of antithrombotics/antiplatelets: Secondary | ICD-10-CM | POA: Insufficient documentation

## 2021-02-28 DIAGNOSIS — Y92018 Other place in single-family (private) house as the place of occurrence of the external cause: Secondary | ICD-10-CM | POA: Diagnosis not present

## 2021-02-28 DIAGNOSIS — Z7982 Long term (current) use of aspirin: Secondary | ICD-10-CM | POA: Diagnosis not present

## 2021-02-28 DIAGNOSIS — Z20822 Contact with and (suspected) exposure to covid-19: Secondary | ICD-10-CM | POA: Diagnosis not present

## 2021-02-28 DIAGNOSIS — R350 Frequency of micturition: Secondary | ICD-10-CM | POA: Diagnosis not present

## 2021-02-28 DIAGNOSIS — I10 Essential (primary) hypertension: Secondary | ICD-10-CM | POA: Diagnosis not present

## 2021-02-28 DIAGNOSIS — R0902 Hypoxemia: Secondary | ICD-10-CM | POA: Diagnosis not present

## 2021-02-28 DIAGNOSIS — R29898 Other symptoms and signs involving the musculoskeletal system: Secondary | ICD-10-CM

## 2021-02-28 DIAGNOSIS — W1839XA Other fall on same level, initial encounter: Secondary | ICD-10-CM | POA: Insufficient documentation

## 2021-02-28 DIAGNOSIS — R2681 Unsteadiness on feet: Secondary | ICD-10-CM | POA: Insufficient documentation

## 2021-02-28 DIAGNOSIS — Z85828 Personal history of other malignant neoplasm of skin: Secondary | ICD-10-CM | POA: Insufficient documentation

## 2021-02-28 DIAGNOSIS — M1611 Unilateral primary osteoarthritis, right hip: Secondary | ICD-10-CM | POA: Diagnosis not present

## 2021-02-28 DIAGNOSIS — Z23 Encounter for immunization: Secondary | ICD-10-CM | POA: Insufficient documentation

## 2021-02-28 DIAGNOSIS — W19XXXA Unspecified fall, initial encounter: Secondary | ICD-10-CM

## 2021-02-28 DIAGNOSIS — I2510 Atherosclerotic heart disease of native coronary artery without angina pectoris: Secondary | ICD-10-CM | POA: Diagnosis not present

## 2021-02-28 DIAGNOSIS — Z951 Presence of aortocoronary bypass graft: Secondary | ICD-10-CM | POA: Diagnosis not present

## 2021-02-28 DIAGNOSIS — F17210 Nicotine dependence, cigarettes, uncomplicated: Secondary | ICD-10-CM | POA: Insufficient documentation

## 2021-02-28 DIAGNOSIS — R531 Weakness: Secondary | ICD-10-CM | POA: Diagnosis not present

## 2021-02-28 LAB — CBC WITH DIFFERENTIAL/PLATELET
Abs Immature Granulocytes: 0.07 10*3/uL (ref 0.00–0.07)
Basophils Absolute: 0 10*3/uL (ref 0.0–0.1)
Basophils Relative: 0 %
Eosinophils Absolute: 0.3 10*3/uL (ref 0.0–0.5)
Eosinophils Relative: 2 %
HCT: 39.9 % (ref 39.0–52.0)
Hemoglobin: 13.2 g/dL (ref 13.0–17.0)
Immature Granulocytes: 1 %
Lymphocytes Relative: 10 %
Lymphs Abs: 1.2 10*3/uL (ref 0.7–4.0)
MCH: 33.8 pg (ref 26.0–34.0)
MCHC: 33.1 g/dL (ref 30.0–36.0)
MCV: 102.3 fL — ABNORMAL HIGH (ref 80.0–100.0)
Monocytes Absolute: 0.9 10*3/uL (ref 0.1–1.0)
Monocytes Relative: 7 %
Neutro Abs: 9.5 10*3/uL — ABNORMAL HIGH (ref 1.7–7.7)
Neutrophils Relative %: 80 %
Platelets: 167 10*3/uL (ref 150–400)
RBC: 3.9 MIL/uL — ABNORMAL LOW (ref 4.22–5.81)
RDW: 12.7 % (ref 11.5–15.5)
WBC: 12 10*3/uL — ABNORMAL HIGH (ref 4.0–10.5)
nRBC: 0 % (ref 0.0–0.2)

## 2021-02-28 LAB — BASIC METABOLIC PANEL
Anion gap: 10 (ref 5–15)
BUN: 39 mg/dL — ABNORMAL HIGH (ref 8–23)
CO2: 27 mmol/L (ref 22–32)
Calcium: 8.9 mg/dL (ref 8.9–10.3)
Chloride: 105 mmol/L (ref 98–111)
Creatinine, Ser: 1.96 mg/dL — ABNORMAL HIGH (ref 0.61–1.24)
GFR, Estimated: 34 mL/min — ABNORMAL LOW (ref >60)
Glucose, Bld: 131 mg/dL — ABNORMAL HIGH (ref 70–99)
Potassium: 4.2 mmol/L (ref 3.5–5.1)
Sodium: 142 mmol/L (ref 135–145)

## 2021-02-28 NOTE — ED Triage Notes (Signed)
Pt reports his legs just gave out on him tonight and he fell down onto his bottom.  Pt states this has been happening more and more recently and was seen here in ER this past Sunday for the same thing.  Pt denies loc, denies pain.

## 2021-02-28 NOTE — ED Triage Notes (Signed)
Emergency Medicine Provider Triage Evaluation Note  DIOGENES WHIRLEY , a 80 y.o. male  was evaluated in triage.  Pt complains of fall.  Review of Systems  Positive: Fall, right hip, weakness, urinary discomfort Negative: Fever, headache, cp  Physical Exam  BP (!) 107/56 (BP Location: Right Arm)   Pulse 77   Temp 98.6 F (37 C) (Oral)   Resp 16   SpO2 96%  Gen:   Awake, no distress   HEENT:  Atraumatic  Resp:  Normal effort  Cardiac:  Normal rate  Abd:   Nondistended, nontender  MSK:   Moves extremities without difficulty, mild tenderness to R hip with FROM, no deformtiy Neuro:  Hard of hearing  Medical Decision Making  Medically screening exam initiated at 10:39 PM.  Appropriate orders placed.  Jaysun Wessels Rozman was informed that the remainder of the evaluation will be completed by another provider, this initial triage assessment does not replace that evaluation, and the importance of remaining in the ED until their evaluation is complete.  Clinical Impression  Recurrent falls, legs gave out.  Mild urinary discomfort.  Walks with a cane.  Seen 2 days ago for similar presentation.  Lives at home with son.    Domenic Moras, PA-C 02/28/21 2241

## 2021-03-01 DIAGNOSIS — R531 Weakness: Secondary | ICD-10-CM | POA: Diagnosis not present

## 2021-03-01 LAB — URINALYSIS, ROUTINE W REFLEX MICROSCOPIC
Bacteria, UA: NONE SEEN
Bilirubin Urine: NEGATIVE
Glucose, UA: NEGATIVE mg/dL
Hgb urine dipstick: NEGATIVE
Ketones, ur: 5 mg/dL — AB
Leukocytes,Ua: NEGATIVE
Nitrite: NEGATIVE
Protein, ur: 100 mg/dL — AB
Specific Gravity, Urine: 1.021 (ref 1.005–1.030)
pH: 5 (ref 5.0–8.0)

## 2021-03-01 MED ORDER — DOCUSATE SODIUM 100 MG PO CAPS: 100.0000 mg | ORAL_CAPSULE | Freq: Every day | ORAL | Status: DC | PRN

## 2021-03-01 MED ORDER — NIFEDIPINE ER OSMOTIC RELEASE 60 MG PO TB24
90.0000 mg | ORAL_TABLET | Freq: Every day | ORAL | Status: DC
  Administered 2021-03-01 – 2021-03-02 (×2): 90 mg via ORAL
  Filled 2021-03-01 (×2): qty 1

## 2021-03-01 MED ORDER — LOSARTAN POTASSIUM 25 MG PO TABS
100.0000 mg | ORAL_TABLET | Freq: Every day | ORAL | Status: DC
  Administered 2021-03-01 – 2021-03-02 (×2): 100 mg via ORAL
  Filled 2021-03-01 (×2): qty 4

## 2021-03-01 MED ORDER — ASPIRIN EC 81 MG PO TBEC
81.0000 mg | DELAYED_RELEASE_TABLET | Freq: Every evening | ORAL | Status: DC
  Administered 2021-03-01 – 2021-03-02 (×2): 81 mg via ORAL
  Filled 2021-03-01 (×2): qty 1

## 2021-03-01 MED ORDER — SIMVASTATIN 20 MG PO TABS
20.0000 mg | ORAL_TABLET | Freq: Every day | ORAL | Status: DC
  Administered 2021-03-02: 20 mg via ORAL
  Filled 2021-03-01: qty 1

## 2021-03-01 MED ORDER — HYDROCHLOROTHIAZIDE 25 MG PO TABS
25.0000 mg | ORAL_TABLET | Freq: Every day | ORAL | Status: DC
  Administered 2021-03-01 – 2021-03-02 (×2): 25 mg via ORAL
  Filled 2021-03-01 (×2): qty 1

## 2021-03-01 MED ORDER — COVID-19 MRNA VAC-TRIS(PFIZER) 30 MCG/0.3ML IM SUSP
0.3000 mL | Freq: Once | INTRAMUSCULAR | Status: AC
  Administered 2021-03-01: 0.3 mL via INTRAMUSCULAR
  Filled 2021-03-01: qty 0.3

## 2021-03-01 MED ORDER — SODIUM CHLORIDE 0.9 % IV BOLUS: 500.0000 mL | Freq: Once | INTRAVENOUS | Status: DC

## 2021-03-01 MED ORDER — CLOPIDOGREL BISULFATE 75 MG PO TABS
75.0000 mg | ORAL_TABLET | Freq: Every day | ORAL | Status: DC
  Administered 2021-03-01 – 2021-03-02 (×2): 75 mg via ORAL
  Filled 2021-03-01 (×2): qty 1

## 2021-03-01 MED ORDER — ACETAMINOPHEN 500 MG PO TABS: 1000.0000 mg | ORAL_TABLET | Freq: Every day | ORAL | Status: DC | PRN

## 2021-03-01 MED ORDER — ISOSORBIDE MONONITRATE ER 30 MG PO TB24
30.0000 mg | ORAL_TABLET | Freq: Every day | ORAL | Status: DC
  Administered 2021-03-01 – 2021-03-02 (×2): 30 mg via ORAL
  Filled 2021-03-01 (×2): qty 1

## 2021-03-01 MED ORDER — FERROUS GLUCONATE 324 (38 FE) MG PO TABS
324.0000 mg | ORAL_TABLET | Freq: Every day | ORAL | Status: DC
  Administered 2021-03-02: 324 mg via ORAL
  Filled 2021-03-01 (×2): qty 1

## 2021-03-01 MED ORDER — RISPERIDONE 0.5 MG PO TABS
0.5000 mg | ORAL_TABLET | Freq: Once | ORAL | Status: AC
  Administered 2021-03-01: 0.5 mg via ORAL
  Filled 2021-03-01: qty 1

## 2021-03-01 MED ORDER — METOPROLOL TARTRATE 25 MG PO TABS
50.0000 mg | ORAL_TABLET | Freq: Every day | ORAL | Status: DC
  Administered 2021-03-01 – 2021-03-02 (×2): 50 mg via ORAL
  Filled 2021-03-01 (×2): qty 2

## 2021-03-01 NOTE — Progress Notes (Signed)
TOC CSW obtained pts PASSAR #.    Earl Castillo, MSW, LCSW-A Pronouns:  She, Her, Hers                  [facility] ED Transitions of CareClinical Social Worker [email or web address] [phone]

## 2021-03-01 NOTE — ED Notes (Signed)
PT at the bedside.

## 2021-03-01 NOTE — Progress Notes (Addendum)
. .   Transition of Care [facility]) - Emergency Department Mini Assessment   Patient Details  Name: Earl Castillo MRN: [id number] Date of Birth: 1941/10/10  Transition of [facility] CM/SW Contact:    Erenest Rasher, RN Phone Number: [phone] 03/01/2021, 6:25 PM   Clinical Narrative: TOC CM spoke to pt at bedside and provided Medicare.gov list of SNF. States it is best to speak to son. He wants [facility]. TOC CM contacted Janie, [facility] and currently do not have beds. He has RW and cane at home, currently lives with son but is requesting SNF rehab. Gave permission to create FL2 and fax referral to SNF rehab. Pt has been vaccinated but has not received booster.   1800 TOC CM spoke to son, [name] and states he is requesting [employer], currently known as Careers adviser in [place]. Contacted [facility] to review referral. Will leave message for admission coordinator, Shirlee Limerick. Son sent pt COVID vaccination card and TOC CM uploaded to a note. He gave permission for pt to receive booster.    ED Mini Assessment: What brought you to the Emergency Department? : fall  Barriers to Discharge: SNF Pending bed offer  Barrier interventions: patient has been faxed out to SNF facilities  Means of departure: Ambulance  Interventions which prevented an admission or readmission: SNF Placement    Patient Contact and Communications CMS Medicare.gov Compare Post Acute Care list provided to:: Patient Choice offered to / list presented to : Patient  Admission diagnosis:  Fall and Leg Weakness Patient Active Problem List   Diagnosis Date Noted  . NAION (non-arteritic anterior ischemic optic neuropathy), left 12/28/2020  . Choroidal nevus, right 11/30/2020  . PAD (peripheral artery disease) ([place]) 08/09/2016  . GI bleed 06/22/2014  . Current smoker 05/24/2014  . Claudication ([place]) 05/23/2014  . Vitamin B12 deficiency 11/23/2013  . Angiodysplasia of intestine - small bowel  09/21/2013  .  PVD- Lt SFA PTA/stent 05/23/14 09/13/2013  . Carotid artery disease ([place]) 09/13/2013  . 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 Personal history of colonic polyps-adenoma 08/17/2012  . NEPHROLITHIASIS, HX OF 09/10/2010  . URTICARIA 08/22/2010  . Coronary atherosclerosis 01/13/2009  . Osteoarthritis 01/13/2009  . CHICKENPOX, HX OF 01/13/2008  . Hyperlipidemia 07/29/2007  . Essential hypertension 07/29/2007   PCP:  [name], MD Pharmacy:   [id number] - [place], [place] - [address] [place] [address] Phone: [phone] Fax: [phone]

## 2021-03-01 NOTE — ED Notes (Signed)
PT messaged regarding consult note.

## 2021-03-01 NOTE — ED Notes (Signed)
Per [employer], social work, pt continues to hold in the ED for PT evaluation.

## 2021-03-01 NOTE — ED Notes (Addendum)
Patients bedding, gown, and [place] were all changed.

## 2021-03-01 NOTE — ED Notes (Signed)
Pt resting, call bell in reach, no acute distress noted, denies pain.

## 2021-03-01 NOTE — ED Notes (Signed)
Patient is resting comfortably. 

## 2021-03-01 NOTE — Discharge Planning (Signed)
RNCM consulted regarding safe discharge planning (Home with Home Health vs Skilled Nursing Facility Placement).  Physical Therapy evaluation placed; will follow up after recommendations from PT.     

## 2021-03-01 NOTE — ED Notes (Signed)
Pt repositioned in bed, call light in reach, denies pain, no acute distress noted.

## 2021-03-01 NOTE — ED Notes (Signed)
Patient was given a lunch tray. He said earlier he did not want it however he changed his mind.

## 2021-03-01 NOTE — ED Notes (Signed)
Pharmacy notified and aware of pt's home medications ordered by provider. Pharmacy to verify.

## 2021-03-01 NOTE — ED Notes (Addendum)
Per [employer], social work, pt will continue to hold in ED for SNF placement.

## 2021-03-01 NOTE — ED Notes (Signed)
Per social work, PT's consult note is pending. PT dispo, following PT consult note, per social work.

## 2021-03-01 NOTE — ED Notes (Signed)
Dr. Eulis Foster aware of pt's hypertensive BP, and per provider, home medications ordered.

## 2021-03-01 NOTE — ED Notes (Signed)
Patient was given his dinner tray.

## 2021-03-01 NOTE — ED Provider Notes (Signed)
[facility] DEPT Provider Note   CSN: [id number] Arrival date & time: 02/28/21  2212     History Chief Complaint  Patient presents with  . Fall    Earl Castillo is a 80 y.o. male.  Patient to ED with his son who found him on the living room floor after fall. He reports this is his second fall since 3 days ago. The patient describes that his legs "give out" causing a fall. Today, he started using a cane and reports he felt weakness in his legs and fell backward to a sitting position. He denies head injury. No back pain, pelvic pain. He states he couldn't get himself up and waited for his some to come home which was about 2 hours. No nausea, vomiting, recent illness or fever. He reports urinary frequency without dysuria.   The history is provided by the patient. No language interpreter was used.  Fall Pertinent negatives include no headaches.       Past Medical History:  Diagnosis Date  . Anemia   . Angiodysplasia of intestine - small bowel  09/21/2013  . Blood transfusion abn reaction or complication, no procedure mishap    "HgB dropped real low"  . Cancer [facility])    Skin cancers  . Carotid artery disease ([place])    status post carotid endarterectomy by [name]  . Chicken pox   . Colon polyps 08/2012   Hyperplastic and adenomatous sessile polyps  . Coronary artery disease    CABG'04, Low risk Myoview 2013  . GERD (gastroesophageal reflux disease)   . Hemorrhoid   . History of blood transfusion 08/2013   "HgB dropped real low; related to bleeding from my rectum"  . History of kidney stones    1 removed surgical, passed 2  . Hyperlipidemia   . Hypertension   . 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 Lower GI bleeding 08/2013  . Osteoarthritis    "hips" (05/23/2014)  . Peripheral arterial disease ([place])    status post left to right fem-fem crossover graft in by [name]   . Pneumonia    - "years ago"  . Sinus headache    "alot" (05/23/2014)  . Skin exam, screening for cancer    sees [name]   . Vitamin B12 deficiency 11/23/2013    Patient Active Problem List   Diagnosis Date Noted  . NAION (non-arteritic anterior ischemic optic neuropathy), left 12/28/2020  . Choroidal nevus, right 11/30/2020  . PAD (peripheral artery disease) ([place]) 08/09/2016  . GI bleed 06/22/2014  . Current smoker 05/24/2014  . Claudication ([place]) 05/23/2014  . Vitamin B12 deficiency 11/23/2013  . Angiodysplasia of intestine - small bowel  09/21/2013  . PVD- Lt SFA PTA/stent 05/23/14 09/13/2013  . Carotid artery disease ([place]) 09/13/2013  . 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 Personal history of colonic polyps-adenoma 08/17/2012  . NEPHROLITHIASIS, HX OF 09/10/2010  . URTICARIA 08/22/2010  . Coronary atherosclerosis 01/13/2009  . Osteoarthritis 01/13/2009  . CHICKENPOX, HX OF 01/13/2008  . Hyperlipidemia 07/29/2007  . Essential hypertension 07/29/2007    Past Surgical History:  Procedure Laterality Date  . BAND HEMORRHOIDECTOMY    . BASAL CELL CARCINOMA EXCISION  May 2009   per [name] , left nose   . basket extraction of ureteral stone  1970's  . Bypass rt leg Right 2000  . capsule  endoscopy  10-05-13   per [name], 4 AVM's  . CARDIAC CATHETERIZATION  08/18/2003   Recommend CABG  . CARDIOVASCULAR STRESS TEST - LEXISCAN  08/25/2012   No significant wall motion abnormalities  . CAROTID ANGIOGRAM N/A 03/17/2014   Procedure: CAROTID ANGIOGRAM;  Surgeon: [name], MD;  Location: [facility] CATH LAB;  Service: Cardiovascular;  Laterality: N/A;  right carotid  . CAROTID ENDARTERECTOMY Right   . CAROTID ENDARTERECTOMY Right 03/26/2005   Dr Georgina Quint  . CATARACT EXTRACTION W/ INTRAOCULAR LENS  IMPLANT, BILATERAL Bilateral 2014   per [name]  . [place]   "bulging disc"   . COLONOSCOPY  08-12-12   per [name], tubular adenomas, repeat in 5 yrs   . CORONARY ARTERY BYPASS GRAFT  08/30/2003   x3. LIMA-distal LAD, RIMA-circumflex, SVG-distal RCA  . CYSTOSCOPY W/ STONE MANIPULATION    . ESOPHAGOGASTRODUODENOSCOPY N/A 08/17/2013   Procedure: ESOPHAGOGASTRODUODENOSCOPY (EGD);  Surgeon: [name], MD;  Location: [facility] ENDOSCOPY;  Service: Endoscopy;  Laterality: N/A;  . FEMORAL ARTERY STENT Left 05/23/2014  . FEMORAL-FEMORAL BYPASS GRAFT  2003  . FEMORAL-POPLITEAL BYPASS GRAFT Left 08/09/2016   Procedure: BYPASS GRAFT LEFT FEMORAL-POPLITEAL ARTERY USING GORE 6MM X 80CM PROPATEN GRAFT;  Surgeon: Serafina Mitchell, MD;  Location: [facility];  Service: Vascular;  Laterality: Left;  . LOWER EXTREMITY ANGIOGRAM N/A 03/17/2014   Procedure: LOWER EXTREMITY ANGIOGRAM;  Surgeon: [name], MD;  Location: [facility] CATH LAB;  Service: Cardiovascular;  Laterality: N/A;  . PERIPHERAL VASCULAR CATHETERIZATION N/A 06/06/2016   Procedure: Lower Extremity Angiography;  Surgeon: [name], MD;  Location: [place] CV LAB;  Service: Cardiovascular;  Laterality: N/A;  . PERIPHERAL VASCULAR CATHETERIZATION N/A 06/06/2016   Procedure: Abdominal Aortogram;  Surgeon: [name], MD;  Location: [place] CV LAB;  Service: Cardiovascular;  Laterality: N/A;  . RENAL DUPLEX Bilateral 02/02/2013   Bilateral renal arteries demonstrate narrowingconsistent with a 60-99% diameter reduction.  . TRANSTHORACIC ECHOCARDIOGRAM  08/02/2013   EF 55-60%, normal echocardiogram       Family History  Problem Relation Age of Onset  . Heart disease Father   . Heart disease Sister   . Coronary artery disease Brother        CABG x 6  . Heart disease Brother   . Coronary artery disease Brother        first MI 41, died at 1 during cardiac surgery  . Coronary artery disease Sister        CABG x 5, RAS  . Heart disease Sister   . Colon cancer Neg Hx     Social History   Tobacco Use  . Smoking  status: Current Some Day Smoker    Packs/day: 0.25    Years: 58.00    Pack years: 14.50    Types: Cigarettes  . Smokeless tobacco: Never Used  . Tobacco comment: rarely smokes now; does have approx 38 pack hx  of smoking/ does now the LDCT   Vaping Use  . Vaping Use: Never used  Substance Use Topics  . Alcohol use: No    Alcohol/week: 0.0 standard drinks  . Drug use: No    Home Medications Prior to Admission medications   Medication Sig Start Date End Date Taking? Authorizing Provider  acetaminophen (TYLENOL) 500 MG tablet Take 1,000 mg by mouth daily as needed (for leg pain).     [provider]  amoxicillin-clavulanate (AUGMENTIN) 875-125 MG tablet Take  1 tablet by mouth 2 (two) times daily. 11/14/20   [name], MD  aspirin EC 81 MG tablet Take 81 mg by mouth every evening.     [provider]  clopidogrel (PLAVIX) 75 MG tablet TAKE 1 TABLET BY MOUTH EVERY DAY 08/11/20   [name], MD  Cyanocobalamin (B-12) 1000 MCG SUBL Place 1 tablet under the tongue daily.    [provider]  docusate sodium (COLACE) 100 MG capsule Take 100 mg by mouth daily as needed (for constipation).  08/19/13   Debbe Odea, MD  ferrous gluconate (FERGON) 324 MG tablet TAKE 1 TABLET BY MOUTH EVERY DAY WITH BREAKFAST. 12/15/20   [name], MD  hydrochlorothiazide (HYDRODIURIL) 25 MG tablet Take 1 tablet (25 mg total) by mouth daily. 11/21/20   [name], MD  isosorbide mononitrate (IMDUR) 30 MG 24 hr tablet TAKE 1 TABLET (30 MG TOTAL) BY MOUTH DAILY. NEED OV. 01/11/21   [name], MD  losartan (COZAAR) 100 MG tablet Take 1 tablet (100 mg total) by mouth daily. 11/21/20   [name], MD  metoprolol tartrate (LOPRESSOR) 50 MG tablet Take 1 tablet (50 mg total) by mouth daily. 02/21/20   [name], MD  NIFEdipine (ADALAT CC) 90 MG 24 hr tablet TAKE 1 TABLET BY MOUTH EVERY DAY 12/14/20   [name], MD  simvastatin (ZOCOR) 20 MG tablet Take 1  tablet (20 mg total) by mouth daily at 6 PM. NEED OV. 02/21/20   [name], MD    Allergies    Patient has no known allergies.  Review of Systems   Review of Systems  Constitutional: Negative for chills and fever.  HENT: Negative.   Respiratory: Negative.   Cardiovascular: Negative.   Gastrointestinal: Negative.   Genitourinary: Positive for frequency.  Musculoskeletal: Negative.   Skin: Negative.   Neurological: Positive for weakness ("My legs just give out".). Negative for light-headedness, numbness and headaches.  Psychiatric/Behavioral: Negative for confusion.    Physical Exam Updated Vital Signs BP (!) 141/55   Pulse 74   Temp 98.6 F (37 C) (Oral)   Resp (!) 21   SpO2 93%   Physical Exam Vitals and nursing note reviewed.  Constitutional:      General: He is not in acute distress.    Appearance: He is well-developed. He is not ill-appearing.  HENT:     Head: Normocephalic and atraumatic.  Cardiovascular:     Rate and Rhythm: Normal rate.  Pulmonary:     Effort: Pulmonary effort is normal.     Breath sounds: Normal breath sounds.  Abdominal:     General: Bowel sounds are normal.     Palpations: Abdomen is soft.     Tenderness: There is no abdominal tenderness. There is no guarding or rebound.  Musculoskeletal:        General: Normal range of motion.     Cervical back: Normal range of motion and neck supple.     Comments: FrOM all joints. LE's have equal and full strength bilaterally. No swelling, redness or deformity. No pelvic tenderness. No midline or paralumbar back tenderness.   Skin:    General: Skin is warm and dry.     Findings: No rash.  Neurological:     General: No focal deficit present.     Mental Status: He is alert and oriented to person, place, and time.     Sensory: No sensory deficit.  ED Results / Procedures / Treatments   Labs (all labs ordered are listed, but only abnormal results are displayed) Labs Reviewed  BASIC  METABOLIC PANEL - Abnormal; Notable for the following components:      Result Value   Glucose, Bld 131 (*)    BUN 39 (*)    Creatinine, Ser 1.96 (*)    GFR, Estimated 34 (*)    All other components within normal limits  CBC WITH DIFFERENTIAL/PLATELET - Abnormal; Notable for the following components:   WBC 12.0 (*)    RBC 3.90 (*)    MCV 102.3 (*)    Neutro Abs 9.5 (*)    All other components within normal limits  URINALYSIS, ROUTINE W REFLEX MICROSCOPIC    EKG None  Radiology DG Hip Unilat W or Wo Pelvis 2-3 Views Right  Result Date: 02/28/2021 CLINICAL DATA:  Pain after a fall. EXAM: DG HIP (WITH OR WITHOUT PELVIS) 2-3V RIGHT COMPARISON:  02/26/2021 FINDINGS: Mild degenerative changes in the hips. No evidence of acute fracture or dislocation of the pelvis or right hip. No focal bone lesion or bone destruction. Prominent vascular calcifications. Surgical clips in the groin regions. Vascular graft on the left femoral region. IMPRESSION: No acute bony abnormalities. Electronically Signed   By: Lucienne Capers M.D.   On: 02/28/2021 23:26    Procedures Procedures   Medications Ordered in ED Medications - No data to display  ED Course  I have reviewed the triage vital signs and the nursing notes.  Pertinent labs & imaging results that were available during my care of the patient were reviewed by me and considered in my medical decision making (see chart for details).    MDM Rules/Calculators/A&P                          Patient to ED after fall with his son, who lives with him. Patient appears to have no injury from fall. Will collect urine with report of frequency.   Suggested SW evaluation of the home for safety in ambulation, which the son will consider. While outside the room, the son expresses concern that the patient has become unable to ambulate safely. Will have PT/TOC evaluation in the am to offer recommendations.     Final Clinical Impression(s) / ED  Diagnoses Final diagnoses:  None   1. Fall 2. LE weakness, bilateral   Rx / DC Orders ED Discharge Orders    None       Dennie Bible 03/01/21 [id number]    Truddie Hidden, MD 03/01/21 [phone]

## 2021-03-01 NOTE — ED Notes (Signed)
ED providers notified regarding pt's hypertensive BP. No additional orders at this time. Will continue to monitor.

## 2021-03-01 NOTE — ED Notes (Signed)
Primo Fit placed on patient

## 2021-03-01 NOTE — ED Notes (Signed)
Water pitcher filled and provided to pt to encourage po intake, pt requested for crackers as well.  Assisted pt to help him slide up in bed, warm blanket provided. Pt denies pain.

## 2021-03-01 NOTE — ED Notes (Signed)
Report received from nightshift RN, Chrys Racer. Per nightshift RN, pt to be evaluated be social work and PT this morning.

## 2021-03-01 NOTE — ED Notes (Signed)
Warm blanket and water provided per pt request.  Pt resting in bed, no acute distress noted.  Pt denies pain, call bell in reach.

## 2021-03-01 NOTE — Evaluation (Signed)
Physical Therapy Evaluation Patient Details Name: Earl Castillo MRN: [id number] DOB: Jul 17, 1941 Today's Date: 03/01/2021   History of Present Illness  Patient is 80 y.o. male presenting to [facility] from home after his son found him on the floor 2/2 fall. Pt reports at least 3 falls in last week. PMH significant for OA, PAD, HTN, HLD, GERD, CAD, anemia.    Clinical Impression  Earl Castillo is 80 y.o. male admitted with above HPI and diagnosis. Patient is currently limited by functional impairments below (see PT problem list). Patient lives with his son and is typically independent at baseline but has recently declined and become weaker and had increased falls. He currently requires supervision for bed mobiltiy, min assist for transfers and gait with RW. Pt with moderate posterior lean in standing and requires cues and assist to facilitate anterior weight shift and prevent LOB. Patient will benefit from continued skilled PT interventions to address impairments and progress independence with mobility, recommending SNF follow up vs HHPT with 24/7 assist if pt/family are able to obtain. Acute PT will follow and progress as able.     Follow Up Recommendations SNF;Home health PT;Supervision/Assistance - 24 hour (SNF vs HHPT with 24/7 assist)    Equipment Recommendations  Rolling walker with 5" wheels;3in1 (PT)    Recommendations for Other Services       Precautions / Restrictions Precautions Precautions: Fall Restrictions Weight Bearing Restrictions: No      Mobility  Bed Mobility Overal bed mobility: Needs Assistance Bed Mobility: Supine to Sit;Sit to Supine     Supine to sit: Supervision;HOB elevated Sit to supine: Supervision   General bed mobility comments: supervision for safety, pt able to raise trunk and bring LE's off EOB with extra time.    Transfers Overall transfer level: Needs assistance Equipment used: Rolling walker (2 wheeled) Transfers: Sit to/from Stand Sit to  Stand: Min assist;From elevated surface         General transfer comment: Cues for safe technique/hand placement with RW, pt with moderate posterior lean on standing. VC's for anterior lean into big toes and tactile cue for weight shift improved posture.  Ambulation/Gait Ambulation/Gait assistance: Min assist Gait Distance (Feet): 30 Feet Assistive device: Rolling walker (2 wheeled) Gait Pattern/deviations: Step-through pattern;Decreased stride length;Shuffle;Narrow base of support Gait velocity: dcer   General Gait Details: Cues for safe proximity to RW required.  Stairs            Wheelchair Mobility    Modified Rankin (Stroke Patients Only)       Balance Overall balance assessment: Needs assistance Sitting-balance support: Feet supported Sitting balance-Leahy Scale: Good   Postural control: Posterior lean Standing balance support: During functional activity;Bilateral upper extremity supported Standing balance-Leahy Scale: Poor Standing balance comment: reliant on external support from RW for static standing and additional support from PT for gait.                             Pertinent Vitals/Pain Pain Assessment: No/denies pain    Home Living Family/patient expects to be discharged to:: Private residence Living Arrangements: Children Available Help at Discharge: Family Type of Home: House Home Access: Stairs to enter Entrance Stairs-Rails: None Entrance Stairs-Number of Steps: 1 Home Layout: One level Home Equipment: Kasandra Knudsen - single point Additional Comments: lives with son and other son is next door, who is recovering from shoulder surgery. Pt's son has to work full time.    Prior Function  Level of Independence: Independent with assistive device(s)         Comments: prior to 1 week ago pt reports he had been independent. He began falling and has started using a SPC in last week. He reports he now feels he needs assistance to complete  bathing, dressing, and mobility safely.     Hand Dominance   Dominant Hand: Right    Extremity/Trunk Assessment   Upper Extremity Assessment Upper Extremity Assessment: Overall WFL for tasks assessed    Lower Extremity Assessment Lower Extremity Assessment: Generalized weakness    Cervical / Trunk Assessment Cervical / Trunk Assessment: Normal  Communication   Communication: No difficulties  Cognition Arousal/Alertness: Awake/alert Behavior During Therapy: WFL for tasks assessed/performed Overall Cognitive Status: Within Functional Limits for tasks assessed                                        General Comments      Exercises     Assessment/Plan    PT Assessment Patient needs continued PT services  PT Problem List Decreased strength;Decreased activity tolerance;Decreased balance;Decreased mobility;Decreased knowledge of use of DME;Decreased safety awareness;Decreased knowledge of precautions       PT Treatment Interventions DME instruction;Gait training;Functional mobility training;Therapeutic activities;Therapeutic exercise;Balance training;Neuromuscular re-education;Patient/family education    PT Goals (Current goals can be found in the Care Plan section)  Acute Rehab PT Goals Patient Stated Goal: stop falling and get stronger PT Goal Formulation: With patient Time For Goal Achievement: 03/15/21 Potential to Achieve Goals: Good    Frequency 7X/week   Barriers to discharge Decreased caregiver support pt's son works full time and unsure what additional family is available to assist.    Co-evaluation               AM-PAC PT "6 Clicks" Mobility  Outcome Measure Help needed turning from your back to your side while in a flat bed without using bedrails?: A Little Help needed moving from lying on your back to sitting on the side of a flat bed without using bedrails?: A Little Help needed moving to and from a bed to a chair (including a  wheelchair)?: A Little Help needed standing up from a chair using your arms (e.g., wheelchair or bedside chair)?: A Little Help needed to walk in hospital room?: A Little Help needed climbing 3-5 steps with a railing? : A Lot 6 Click Score: 17    End of Session Equipment Utilized During Treatment: Gait belt Activity Tolerance: Patient tolerated treatment well Patient left: in bed;with call bell/phone within reach;with family/visitor present Nurse Communication: Mobility status;Other (comment) (elevated BP) PT Visit Diagnosis: Muscle weakness (generalized) (M62.81);Difficulty in walking, not elsewhere classified (R26.2);Unsteadiness on feet (R26.81)    Time: 7902-4097 PT Time Calculation (min) (ACUTE ONLY): 36 min   Charges:   PT Evaluation $PT Eval Moderate Complexity: 1 Mod PT Treatments $Gait Training: 8-22 mins        Verner Mould, DPT Acute Rehabilitation Services Office [phone] Pager [phone]    Jacques Navy 03/01/2021, 2:54 PM

## 2021-03-01 NOTE — ED Notes (Signed)
Pt A&O x4. Attached to cardiac monitor x3. VSS. Pt denies any complaints or concerns at this time. Pt readjusted in bed.

## 2021-03-01 NOTE — NC FL2 (Signed)
[place] LEVEL OF CARE SCREENING TOOL     IDENTIFICATION  Patient Name: Earl Castillo Birthdate: [birth year] Sex: male Admission Date (Current Location): 02/28/2021  [facility] and [place] Number:  Herbalist and Address:  [facility],  [place] [address] [place]      Provider Number: [id number]  Attending Physician Name and Address:  Default, Provider, MD  Relative Name and Phone Number:  Wacey Zieger [phone]    Current Level of Care: Hospital Recommended Level of Care: [place] Prior Approval Number:    Date Approved/Denied:   PASRR Number: [id number]  Discharge Plan: SNF    Current Diagnoses: Patient Active Problem List   Diagnosis Date Noted  . NAION (non-arteritic anterior ischemic optic neuropathy), left 12/28/2020  . Choroidal nevus, right 11/30/2020  . PAD (peripheral artery disease) ([place]) 08/09/2016  . GI bleed 06/22/2014  . Current smoker 05/24/2014  . Claudication ([place]) 05/23/2014  . Vitamin B12 deficiency 11/23/2013  . Angiodysplasia of intestine - small bowel  09/21/2013  . PVD- Lt SFA PTA/stent 05/23/14 09/13/2013  . Carotid artery disease ([place]) 09/13/2013  . 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 Personal history of colonic polyps-adenoma 08/17/2012  . NEPHROLITHIASIS, HX OF 09/10/2010  . URTICARIA 08/22/2010  . Coronary atherosclerosis 01/13/2009  . Osteoarthritis 01/13/2009  . CHICKENPOX, HX OF 01/13/2008  . Hyperlipidemia 07/29/2007  . Essential hypertension 07/29/2007    Orientation RESPIRATION BLADDER Height & Weight     Self,Situation,Place  Normal Continent Weight: 133 lb (60.3 kg) Height:  5' 6.5" (168.9 cm)  BEHAVIORAL SYMPTOMS/MOOD NEUROLOGICAL BOWEL NUTRITION STATUS      Continent Diet (Regular)  AMBULATORY STATUS COMMUNICATION OF NEEDS Skin   Supervision Verbally Normal                        Personal Care Assistance Level of Assistance  Bathing,Feeding,Dressing Bathing Assistance: Limited assistance Feeding assistance: Independent Dressing Assistance: Limited assistance     Functional Limitations Info  Sight,Hearing,Speech Sight Info: Adequate Hearing Info: Adequate Speech Info: Adequate    SPECIAL CARE FACTORS FREQUENCY  PT (By licensed PT),OT (By licensed OT)     PT Frequency: 5x a week OT Frequency: 5x a week            Contractures Contractures Info: Not present    Additional Factors Info  Code Status,Allergies Code Status Info: Full Allergies Info: NKA           Current Medications (03/01/2021):  This is the current hospital active medication list No current facility-administered medications for this encounter.   Current Outpatient Medications  Medication Sig Dispense Refill  . acetaminophen (TYLENOL) 500 MG tablet Take 1,000 mg by mouth daily as needed (for leg pain).     Marland Kitchen aspirin EC 81 MG tablet Take 81 mg by mouth every evening.     . clopidogrel (PLAVIX) 75 MG tablet TAKE 1 TABLET BY MOUTH EVERY DAY (Patient taking differently: Take 75 mg by mouth daily.) 90 tablet 1  . Cyanocobalamin (B-12) 1000 MCG SUBL Place 1 tablet under the tongue daily.    Marland Kitchen docusate sodium (COLACE) 100 MG capsule Take 100 mg by mouth daily as needed (for constipation).     . ferrous gluconate (FERGON) 324 MG tablet TAKE 1 TABLET BY MOUTH EVERY DAY WITH BREAKFAST. (Patient taking differently: Take 324 mg by mouth daily with breakfast.) 90 tablet 0  .  hydrochlorothiazide (HYDRODIURIL) 25 MG tablet Take 1 tablet (25 mg total) by mouth daily. 90 tablet 3  . isosorbide mononitrate (IMDUR) 30 MG 24 hr tablet TAKE 1 TABLET (30 MG TOTAL) BY MOUTH DAILY. NEED OV. (Patient taking differently: Take 30 mg by mouth daily.) 90 tablet 3  . losartan (COZAAR) 100 MG tablet Take 1 tablet (100 mg total) by mouth daily. 90 tablet 3  . metoprolol tartrate (LOPRESSOR) 50 MG tablet Take 1  tablet (50 mg total) by mouth daily. 90 tablet 3  . NIFEdipine (ADALAT CC) 90 MG 24 hr tablet TAKE 1 TABLET BY MOUTH EVERY DAY (Patient taking differently: Take 90 mg by mouth daily.) 90 tablet 3  . simvastatin (ZOCOR) 20 MG tablet Take 1 tablet (20 mg total) by mouth daily at 6 PM. NEED OV. (Patient taking differently: Take 20 mg by mouth daily at 6 PM.) 90 tablet 3     Discharge Medications: Please see discharge summary for a list of discharge medications.  Relevant Imaging Results:  Relevant Lab Results:   Additional Information SSN#:  [id number]  Ht:  5" 6.5"  Wt:  133 lb  BMI:  21.15 kg/m2  Deep [place] C Tarpley-Carter, LCSWA

## 2021-03-02 DIAGNOSIS — R062 Wheezing: Secondary | ICD-10-CM | POA: Diagnosis not present

## 2021-03-02 DIAGNOSIS — Z9181 History of falling: Secondary | ICD-10-CM | POA: Diagnosis not present

## 2021-03-02 DIAGNOSIS — K219 Gastro-esophageal reflux disease without esophagitis: Secondary | ICD-10-CM | POA: Diagnosis not present

## 2021-03-02 DIAGNOSIS — I739 Peripheral vascular disease, unspecified: Secondary | ICD-10-CM | POA: Diagnosis not present

## 2021-03-02 DIAGNOSIS — M1990 Unspecified osteoarthritis, unspecified site: Secondary | ICD-10-CM | POA: Diagnosis not present

## 2021-03-02 DIAGNOSIS — R0989 Other specified symptoms and signs involving the circulatory and respiratory systems: Secondary | ICD-10-CM | POA: Diagnosis not present

## 2021-03-02 DIAGNOSIS — R279 Unspecified lack of coordination: Secondary | ICD-10-CM | POA: Diagnosis not present

## 2021-03-02 DIAGNOSIS — R5381 Other malaise: Secondary | ICD-10-CM | POA: Diagnosis not present

## 2021-03-02 DIAGNOSIS — I959 Hypotension, unspecified: Secondary | ICD-10-CM | POA: Diagnosis not present

## 2021-03-02 DIAGNOSIS — Z23 Encounter for immunization: Secondary | ICD-10-CM | POA: Diagnosis not present

## 2021-03-02 DIAGNOSIS — D3131 Benign neoplasm of right choroid: Secondary | ICD-10-CM | POA: Diagnosis not present

## 2021-03-02 DIAGNOSIS — D508 Other iron deficiency anemias: Secondary | ICD-10-CM | POA: Diagnosis not present

## 2021-03-02 DIAGNOSIS — H47012 Ischemic optic neuropathy, left eye: Secondary | ICD-10-CM | POA: Diagnosis not present

## 2021-03-02 DIAGNOSIS — Z743 Need for continuous supervision: Secondary | ICD-10-CM | POA: Diagnosis not present

## 2021-03-02 DIAGNOSIS — R278 Other lack of coordination: Secondary | ICD-10-CM | POA: Diagnosis not present

## 2021-03-02 DIAGNOSIS — M6281 Muscle weakness (generalized): Secondary | ICD-10-CM | POA: Diagnosis not present

## 2021-03-02 DIAGNOSIS — R41841 Cognitive communication deficit: Secondary | ICD-10-CM | POA: Diagnosis not present

## 2021-03-02 DIAGNOSIS — E538 Deficiency of other specified B group vitamins: Secondary | ICD-10-CM | POA: Diagnosis not present

## 2021-03-02 DIAGNOSIS — D519 Vitamin B12 deficiency anemia, unspecified: Secondary | ICD-10-CM | POA: Diagnosis not present

## 2021-03-02 DIAGNOSIS — D649 Anemia, unspecified: Secondary | ICD-10-CM | POA: Diagnosis not present

## 2021-03-02 DIAGNOSIS — R531 Weakness: Secondary | ICD-10-CM | POA: Diagnosis not present

## 2021-03-02 DIAGNOSIS — K5900 Constipation, unspecified: Secondary | ICD-10-CM | POA: Diagnosis not present

## 2021-03-02 DIAGNOSIS — F17210 Nicotine dependence, cigarettes, uncomplicated: Secondary | ICD-10-CM | POA: Diagnosis not present

## 2021-03-02 DIAGNOSIS — Z20822 Contact with and (suspected) exposure to covid-19: Secondary | ICD-10-CM | POA: Diagnosis not present

## 2021-03-02 DIAGNOSIS — R059 Cough, unspecified: Secondary | ICD-10-CM | POA: Diagnosis not present

## 2021-03-02 DIAGNOSIS — R0902 Hypoxemia: Secondary | ICD-10-CM | POA: Diagnosis not present

## 2021-03-02 DIAGNOSIS — I2510 Atherosclerotic heart disease of native coronary artery without angina pectoris: Secondary | ICD-10-CM | POA: Diagnosis not present

## 2021-03-02 DIAGNOSIS — Z85828 Personal history of other malignant neoplasm of skin: Secondary | ICD-10-CM | POA: Diagnosis not present

## 2021-03-02 DIAGNOSIS — R350 Frequency of micturition: Secondary | ICD-10-CM | POA: Diagnosis not present

## 2021-03-02 DIAGNOSIS — I10 Essential (primary) hypertension: Secondary | ICD-10-CM | POA: Diagnosis not present

## 2021-03-02 DIAGNOSIS — J329 Chronic sinusitis, unspecified: Secondary | ICD-10-CM | POA: Diagnosis not present

## 2021-03-02 DIAGNOSIS — E785 Hyperlipidemia, unspecified: Secondary | ICD-10-CM | POA: Diagnosis not present

## 2021-03-02 LAB — SARS CORONAVIRUS 2 (TAT 6-24 HRS): SARS Coronavirus 2: NEGATIVE

## 2021-03-02 MED ORDER — LORAZEPAM 0.5 MG PO TABS
0.5000 mg | ORAL_TABLET | Freq: Four times a day (QID) | ORAL | Status: DC | PRN
  Administered 2021-03-02: 0.5 mg via ORAL
  Filled 2021-03-02: qty 1

## 2021-03-02 MED ORDER — OLANZAPINE 10 MG PO TBDP
10.0000 mg | ORAL_TABLET | Freq: Three times a day (TID) | ORAL | Status: DC | PRN
  Administered 2021-03-02: 10 mg via ORAL
  Filled 2021-03-02: qty 1

## 2021-03-02 NOTE — Progress Notes (Signed)
Physical Therapy Treatment Patient Details Name: Earl Castillo MRN: [id number] DOB: 07-26-1941 Today's Date: 03/02/2021    History of Present Illness Patient is 80 y.o. male presenting to [facility] from home after his son found him on the floor 2/2 fall. Pt reports at least 3 falls in last week. PMH significant for OA, PAD, HTN, HLD, GERD, CAD, anemia.    PT Comments    Patient significantly more confused today and having great difficulty following commands. He required Mod Assist to complete supine>sit bed mobility and demonstrated significant posterior lean at EOB. Patient unsafe to attempt transfers this session due to confusion and increased balance deficits. Patient will benefit from SNF placement for follow up therapy. Acute PT will follow and progress as able.   Follow Up Recommendations  SNF     Equipment Recommendations  Rolling walker with 5" wheels;3in1 (PT)    Recommendations for Other Services       Precautions / Restrictions Precautions Precautions: Fall Restrictions Weight Bearing Restrictions: No    Mobility  Bed Mobility Overal bed mobility: Needs Assistance Bed Mobility: Supine to Sit;Sit to Supine;Rolling Rolling: Min assist   Supine to sit: HOB elevated;Mod assist Sit to supine: Min assist   General bed mobility comments: Pt more confused and having greater difficulty sequencing bed mobility. Required Mod Assist to raise LE's and raise trunk to sit up EOB. SIgnificant posterior lean and extending LE's forward. Patient unsafe to progress transfer and Min assist to return to bed and roll for posey belt placement.    Transfers                    Ambulation/Gait                 Stairs             Wheelchair Mobility    Modified Rankin (Stroke Patients Only)       Balance Overall balance assessment: Needs assistance Sitting-balance support: Feet supported Sitting balance-Leahy Scale: Good   Postural control: Posterior  lean Standing balance support: During functional activity;Bilateral upper extremity supported Standing balance-Leahy Scale: Poor Standing balance comment: reliant on external support from RW for static standing and additional support from PT for gait.                            Cognition Arousal/Alertness: Awake/alert Behavior During Therapy: WFL for tasks assessed/performed Overall Cognitive Status: Impaired/Different from baseline Area of Impairment: Orientation;Attention;Following commands;Awareness;Safety/judgement;Problem solving                 Orientation Level: Disoriented to;Place;Time;Situation Current Attention Level: Sustained   Following Commands: Follows one step commands inconsistently;Follows one step commands with increased time Safety/Judgement: Decreased awareness of safety;Decreased awareness of deficits Awareness: Intellectual Problem Solving: Slow processing;Decreased initiation;Difficulty sequencing;Requires verbal cues;Requires tactile cues General Comments: pt with impaired cognition and incresaed confusion compared to yesterday. Pt required repeated verbal/tactile for sequencing and to initiate mobility. pt confused and mimicing dog bark and snapping teeth in joking manner. patient required repeated redirection to task.      Exercises      General Comments        Pertinent Vitals/Pain Pain Assessment: No/denies pain    Home Living                      Prior Function            PT Goals (current  goals can now be found in the care plan section) Acute Rehab PT Goals Patient Stated Goal: stop falling and get stronger PT Goal Formulation: With patient Time For Goal Achievement: 03/15/21 Potential to Achieve Goals: Good    Frequency    7X/week      PT Plan      Co-evaluation              AM-PAC PT "6 Clicks" Mobility   Outcome Measure  Help needed turning from your back to your side while in a flat bed  without using bedrails?: A Little Help needed moving from lying on your back to sitting on the side of a flat bed without using bedrails?: A Little Help needed moving to and from a bed to a chair (including a wheelchair)?: A Little Help needed standing up from a chair using your arms (e.g., wheelchair or bedside chair)?: A Little Help needed to walk in hospital room?: A Little Help needed climbing 3-5 steps with a railing? : A Lot 6 Click Score: 17    End of Session Equipment Utilized During Treatment: Gait belt Activity Tolerance: Patient tolerated treatment well Patient left: in bed;with call bell/phone within reach;with family/visitor present Nurse Communication: Mobility status;Other (comment) (elevated BP) PT Visit Diagnosis: Muscle weakness (generalized) (M62.81);Difficulty in walking, not elsewhere classified (R26.2);Unsteadiness on feet (R26.81)     Time: 1130-1147 PT Time Calculation (min) (ACUTE ONLY): 17 min  Charges:  $Therapeutic Activity: 8-22 mins                     Verner Mould, DPT Acute Rehabilitation Services Office [phone] Pager [phone]     Jacques Navy 03/02/2021, 3:27 PM

## 2021-03-02 NOTE — TOC Transition Note (Addendum)
Transition of Care [facility]) - CM/SW Discharge Note   Patient Details  Name: Earl Castillo MRN: [id number] Date of Birth: 08/19/1941  Transition of Care [facility]) CM/SW Contact:  Erenest Rasher, RN Phone Number: [phone]  03/02/2021, 2:45 PM   Clinical Narrative:    TOC CM spoke to pt's son to make aware pt was accepted at [facility]. Received notification from [place], Admission Coordinator at facility. His room number is #[id number] and call report to [phone]. PTAR called for transport. ED provider and RN updated.   355 pm TOC CM faxed AVS, COVID vaccine, COVID results and immunizations to [facility] SNF fax # [phone]. Spoke to pt and brother in the room to give updated. Provided PTAR paperwork to ED RN.   Final next level of care: Skilled Nursing Facility Barriers to Discharge: No Barriers Identified   Patient Goals and CMS Choice Patient states their goals for this hospitalization and ongoing recovery are:: get stronger CMS Medicare.gov Compare Post Acute Care list provided to:: Patient Represenative (must comment) (son Hollister Wessler) Choice offered to / list presented to : Adult Children  Discharge Placement              Patient chooses bed at: [facility] (now [facility])   Name of family member notified: Todd Nieves Patient and family notified of of transfer: 03/02/21  Discharge Plan and Services                                     Social Determinants of Health (SDOH) Interventions     Readmission Risk Interventions No flowsheet data found.

## 2021-03-02 NOTE — ED Notes (Signed)
Pt at bedside to ambulate with patient. Pt more awake, less agitated.

## 2021-03-02 NOTE — ED Provider Notes (Signed)
Emergency Medicine Observation Re-evaluation Note  Earl Castillo is a 80 y.o. male, seen on rounds today.  Pt initially presented to the ED for complaints of Fall Currently, the patient is restless, trying to get out of bed.  Physical Exam  BP 125/87 (BP Location: Right Arm)   Pulse 75   Temp 98.2 F (36.8 C) (Oral)   Resp (!) 22   Ht 5' 6.5" (1.689 m)   Wt 60.3 kg   SpO2 93%   BMI 21.15 kg/m  Physical Exam General: Somewhat restless, mildly agitated Cardiac: Warm and well-perfused Lungs: Even and unlabored Psych: Mild agitation  ED Course / MDM  EKG:EKG Interpretation  Date/Time:  Wednesday February 28 2021 22:46:02 EDT Ventricular Rate:  69 PR Interval:  128 QRS Duration: 87 QT Interval:  427 QTC Calculation: 458 R Axis:   15 Text Interpretation: Sinus rhythm Abnormal R-wave progression, early transition Nonspecific repol abnormality, diffuse leads No significant change since last tracing Confirmed by Calvert Cantor [phone]) on 03/01/2021 6:27:40 AM   I have reviewed the labs performed to date as well as medications administered while in observation.  Recent changes in the last 24 hours include restless overnight, suspected sundowning.  Plan  Current plan is for SNF placement. Placed order for prn zyprexa for agitation.    Lucrezia Starch, MD 03/02/21 [phone]

## 2021-03-02 NOTE — ED Notes (Signed)
Pt brother at bedside, updates provided.

## 2021-03-02 NOTE — Progress Notes (Signed)
TOC CSW attempted to contact pts son/Todd Castorena [phone].  CSW left HIPPA compliant message with my contact information.  Allysen Lazo Tarpley-Carter, MSW, LCSW-A Pronouns:  She, Her, [place] ED Transitions of CareClinical Social Worker [email or web address] [phone]

## 2021-03-02 NOTE — ED Notes (Signed)
Patient continues to work his way out of the bed, posey belt now in place.

## 2021-03-02 NOTE — ED Notes (Signed)
Moved closer to nurses station for closer observation.

## 2021-03-02 NOTE — ED Notes (Signed)
Pt ate lunch tray with help of brother. Awaiting PTAR transport to SNF

## 2021-03-02 NOTE — Progress Notes (Signed)
TOC CSW spoke with Shirlee Limerick Ann/[facility] [phone].  [facility] does have a bed available today.  CSW will inform pt that this is a smoke free campus and will not be allowed to smoke at facility per Elyse Hsu.  Kynleigh Artz Tarpley-Carter, MSW, LCSW-A Pronouns:  She, Her, Hers                  [place] ED Transitions of CareClinical Social Worker [email or web address] [phone]

## 2021-03-02 NOTE — ED Notes (Signed)
PTAR at bedside to transport pt. Brother at bedside taking pts belongings home.

## 2021-03-02 NOTE — ED Notes (Addendum)
Continually trying to get out of bed despite reorienting and repositioning. Mittens removed and skin checked. Linens changed. Pt provided water. Notified ED MD Dykstra that patient may need additional PRN for agitation and confusion.

## 2021-03-03 DIAGNOSIS — R0989 Other specified symptoms and signs involving the circulatory and respiratory systems: Secondary | ICD-10-CM | POA: Diagnosis not present

## 2021-03-05 ENCOUNTER — Other Ambulatory Visit: Payer: Self-pay | Admitting: Cardiovascular Disease

## 2021-03-05 DIAGNOSIS — J329 Chronic sinusitis, unspecified: Secondary | ICD-10-CM | POA: Diagnosis not present

## 2021-03-05 DIAGNOSIS — E538 Deficiency of other specified B group vitamins: Secondary | ICD-10-CM | POA: Diagnosis not present

## 2021-03-05 DIAGNOSIS — R062 Wheezing: Secondary | ICD-10-CM | POA: Diagnosis not present

## 2021-03-05 DIAGNOSIS — I10 Essential (primary) hypertension: Secondary | ICD-10-CM | POA: Diagnosis not present

## 2021-03-05 DIAGNOSIS — D508 Other iron deficiency anemias: Secondary | ICD-10-CM | POA: Diagnosis not present

## 2021-03-05 DIAGNOSIS — I2510 Atherosclerotic heart disease of native coronary artery without angina pectoris: Secondary | ICD-10-CM | POA: Diagnosis not present

## 2021-03-05 DIAGNOSIS — E785 Hyperlipidemia, unspecified: Secondary | ICD-10-CM | POA: Diagnosis not present

## 2021-03-05 DIAGNOSIS — R5381 Other malaise: Secondary | ICD-10-CM | POA: Diagnosis not present

## 2021-03-12 DIAGNOSIS — I2510 Atherosclerotic heart disease of native coronary artery without angina pectoris: Secondary | ICD-10-CM | POA: Diagnosis not present

## 2021-03-12 DIAGNOSIS — E785 Hyperlipidemia, unspecified: Secondary | ICD-10-CM | POA: Diagnosis not present

## 2021-03-12 DIAGNOSIS — I10 Essential (primary) hypertension: Secondary | ICD-10-CM | POA: Diagnosis not present

## 2021-03-12 DIAGNOSIS — R5381 Other malaise: Secondary | ICD-10-CM | POA: Diagnosis not present

## 2021-03-12 DIAGNOSIS — D508 Other iron deficiency anemias: Secondary | ICD-10-CM | POA: Diagnosis not present

## 2021-03-12 DIAGNOSIS — J329 Chronic sinusitis, unspecified: Secondary | ICD-10-CM | POA: Diagnosis not present

## 2021-03-12 DIAGNOSIS — E538 Deficiency of other specified B group vitamins: Secondary | ICD-10-CM | POA: Diagnosis not present

## 2021-03-13 ENCOUNTER — Other Ambulatory Visit: Payer: Self-pay | Admitting: Family Medicine

## 2021-03-14 DIAGNOSIS — I2510 Atherosclerotic heart disease of native coronary artery without angina pectoris: Secondary | ICD-10-CM | POA: Diagnosis not present

## 2021-03-14 DIAGNOSIS — J329 Chronic sinusitis, unspecified: Secondary | ICD-10-CM | POA: Diagnosis not present

## 2021-03-14 DIAGNOSIS — D508 Other iron deficiency anemias: Secondary | ICD-10-CM | POA: Diagnosis not present

## 2021-03-14 DIAGNOSIS — R5381 Other malaise: Secondary | ICD-10-CM | POA: Diagnosis not present

## 2021-03-14 DIAGNOSIS — I10 Essential (primary) hypertension: Secondary | ICD-10-CM | POA: Diagnosis not present

## 2021-03-14 DIAGNOSIS — E785 Hyperlipidemia, unspecified: Secondary | ICD-10-CM | POA: Diagnosis not present

## 2021-03-14 DIAGNOSIS — E538 Deficiency of other specified B group vitamins: Secondary | ICD-10-CM | POA: Diagnosis not present

## 2021-03-17 DIAGNOSIS — Z9181 History of falling: Secondary | ICD-10-CM | POA: Diagnosis not present

## 2021-03-17 DIAGNOSIS — E785 Hyperlipidemia, unspecified: Secondary | ICD-10-CM | POA: Diagnosis not present

## 2021-03-17 DIAGNOSIS — Z7902 Long term (current) use of antithrombotics/antiplatelets: Secondary | ICD-10-CM | POA: Diagnosis not present

## 2021-03-17 DIAGNOSIS — Z95828 Presence of other vascular implants and grafts: Secondary | ICD-10-CM | POA: Diagnosis not present

## 2021-03-17 DIAGNOSIS — Z85828 Personal history of other malignant neoplasm of skin: Secondary | ICD-10-CM | POA: Diagnosis not present

## 2021-03-17 DIAGNOSIS — J329 Chronic sinusitis, unspecified: Secondary | ICD-10-CM | POA: Diagnosis not present

## 2021-03-17 DIAGNOSIS — Z8701 Personal history of pneumonia (recurrent): Secondary | ICD-10-CM | POA: Diagnosis not present

## 2021-03-17 DIAGNOSIS — I10 Essential (primary) hypertension: Secondary | ICD-10-CM | POA: Diagnosis not present

## 2021-03-17 DIAGNOSIS — Z87891 Personal history of nicotine dependence: Secondary | ICD-10-CM | POA: Diagnosis not present

## 2021-03-17 DIAGNOSIS — D509 Iron deficiency anemia, unspecified: Secondary | ICD-10-CM | POA: Diagnosis not present

## 2021-03-17 DIAGNOSIS — K5520 Angiodysplasia of colon without hemorrhage: Secondary | ICD-10-CM | POA: Diagnosis not present

## 2021-03-17 DIAGNOSIS — I2510 Atherosclerotic heart disease of native coronary artery without angina pectoris: Secondary | ICD-10-CM | POA: Diagnosis not present

## 2021-03-17 DIAGNOSIS — R296 Repeated falls: Secondary | ICD-10-CM | POA: Diagnosis not present

## 2021-03-17 DIAGNOSIS — Z7982 Long term (current) use of aspirin: Secondary | ICD-10-CM | POA: Diagnosis not present

## 2021-03-17 DIAGNOSIS — E538 Deficiency of other specified B group vitamins: Secondary | ICD-10-CM | POA: Diagnosis not present

## 2021-03-17 DIAGNOSIS — K219 Gastro-esophageal reflux disease without esophagitis: Secondary | ICD-10-CM | POA: Diagnosis not present

## 2021-03-17 DIAGNOSIS — Z87442 Personal history of urinary calculi: Secondary | ICD-10-CM | POA: Diagnosis not present

## 2021-03-17 DIAGNOSIS — Z951 Presence of aortocoronary bypass graft: Secondary | ICD-10-CM | POA: Diagnosis not present

## 2021-03-17 DIAGNOSIS — W19XXXD Unspecified fall, subsequent encounter: Secondary | ICD-10-CM | POA: Diagnosis not present

## 2021-03-17 DIAGNOSIS — I739 Peripheral vascular disease, unspecified: Secondary | ICD-10-CM | POA: Diagnosis not present

## 2021-03-19 ENCOUNTER — Other Ambulatory Visit: Payer: Self-pay

## 2021-03-19 ENCOUNTER — Ambulatory Visit (HOSPITAL_COMMUNITY)
Admission: RE | Admit: 2021-03-19 | Discharge: 2021-03-19 | Disposition: A | Discharge status: Home / Self Care | Payer: Medicare Other | Source: Ambulatory Visit | Attending: Surgery | Admitting: Surgery

## 2021-03-19 ENCOUNTER — Encounter: Payer: Self-pay | Admitting: Surgery

## 2021-03-19 ENCOUNTER — Ambulatory Visit (INDEPENDENT_AMBULATORY_CARE_PROVIDER_SITE_OTHER): Payer: Medicare Other | Admitting: Surgery

## 2021-03-19 VITALS — BP 145/64 | HR 64 | Temp 98.2°F | Resp 20 | Ht 66.5 in | Wt 133.0 lb

## 2021-03-19 DIAGNOSIS — I70213 Atherosclerosis of native arteries of extremities with intermittent claudication, bilateral legs: Secondary | ICD-10-CM | POA: Diagnosis not present

## 2021-03-19 DIAGNOSIS — I6523 Occlusion and stenosis of bilateral carotid arteries: Secondary | ICD-10-CM

## 2021-03-19 NOTE — Progress Notes (Signed)
[facility] [place]  Patient name: Earl Castillo MRN: [id number] DOB: 1941-09-16 Sex: male   REQUESTING PROVIDER:    [name]   REASON FOR CONSULT:    Carotid stenosis  HISTORY OF PRESENT ILLNESS:   Earl Castillo is a 80 y.o. male who is status post left to right femoral-femoral bypass graft by [name] in 2000.  This was done for an occluded right common iliac artery.  He then underwent multiple percutaneous interventions by [name].  I then performed a left femoral to above-knee popliteal artery bypass graft with PTFE on 08/09/2016.  Doppler studies in July 2021 revealed an occluded femoral-popliteal bypass graft.  He continues to suffer from claudication which is progressing.  He does not have rest pain or open wounds.  He tells me he in February 2022, he developed a retinal artery occlusion and lost vision in the lower half of his left eye.  This has slowly improved.  His carotid arteries have been followed by [name].  His last carotid ultrasound was in July 2021 that showed a widely patent right carotid artery but 60-79% stenosis on the left.  The patient has not had any further episodes with vision loss.  He denies other localizing symptoms such as numbness or weakness in either extremity or slurred speech.   Patient continues to smoke, but has cut back significantly.  He is on a statin for hypercholesterolemia.  He is on dual antiplatelet therapy.  He suffers from coronary artery disease and is status post CABG in 2004.  He has undergone carotid endarterectomy by Dr. Deon Pilling.    PAST MEDICAL HISTORY    Past Medical History:  Diagnosis Date  . Anemia   . Angiodysplasia of intestine - small bowel  09/21/2013  . Blood transfusion abn reaction or complication, no procedure mishap    "HgB dropped real low"  . Cancer [facility])    Skin cancers  . Carotid artery disease ([place])    status post carotid endarterectomy by Dr.  [name]  . Chicken pox   . Colon polyps 08/2012   Hyperplastic and adenomatous sessile polyps  . Coronary artery disease    CABG'04, Low risk Myoview 2013  . GERD (gastroesophageal reflux disease)   . Hemorrhoid   . History of blood transfusion 08/2013   "HgB dropped real low; related to bleeding from my rectum"  . History of kidney stones    1 removed surgical, passed 2  . Hyperlipidemia   . Hypertension   . 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 Lower GI bleeding 08/2013  . Osteoarthritis    "hips" (05/23/2014)  . Peripheral arterial disease ([place])    status post left to right fem-fem crossover graft in by [name]  . Pneumonia    - "years ago"  . Sinus headache    "alot" (05/23/2014)  . Skin exam, screening for cancer    sees [name]   . Stroke ([place])   . Vitamin B12 deficiency 11/23/2013     FAMILY HISTORY   Family History  Problem Relation Age of Onset  . Heart disease Father   . Heart disease Sister   . Coronary artery disease Brother        CABG x 6  . Heart disease Brother   . Coronary artery disease Brother        first MI 61, died at 6 during cardiac  surgery  . Coronary artery disease Sister        CABG x 5, RAS  . Heart disease Sister   . Colon cancer Neg Hx     SOCIAL HISTORY:   Social History   Socioeconomic History  . Marital status: Married    Spouse name: Not on file  . Number of children: 2  . Years of education: Not on file  . Highest education level: Not on file  Occupational History  . Occupation:  Semi Retired Geophysicist/field seismologist  Tobacco Use  . Smoking status: Current Some Day Smoker    Packs/day: 0.25    Years: 58.00    Pack years: 14.50    Types: Cigarettes  . Smokeless tobacco: Never Used  . Tobacco comment: rarely smokes now; does have approx 38 pack hx  of smoking/ does now the LDCT   Vaping Use  . Vaping Use: Never used  Substance and Sexual Activity  . Alcohol  use: No    Alcohol/week: 0.0 standard drinks  . Drug use: No  . Sexual activity: Not Currently  Other Topics Concern  . Not on file  Social History Narrative   Daily caffeine    Social Determinants of Health   Financial Resource Strain: Low Risk   . Difficulty of Paying Living Expenses: Not hard at all  Food Insecurity: No Food Insecurity  . Worried About Charity fundraiser in the Last Year: Never true  . Ran Out of Food in the Last Year: Never true  Transportation Needs: No Transportation Needs  . Lack of Transportation (Medical): No  . Lack of Transportation (Non-Medical): No  Physical Activity: Insufficiently Active  . Days of Exercise per Week: 3 days  . Minutes of Exercise per Session: 20 min  Stress: No Stress Concern Present  . Feeling of Stress : Not at all  Social Connections: Moderately Isolated  . Frequency of Communication with Friends and Family: More than three times a week  . Frequency of Social Gatherings with Friends and Family: More than three times a week  . Attends Religious Services: 1 to 4 times per year  . Active Member of Clubs or Organizations: No  . Attends Archivist Meetings: Never  . Marital Status: Widowed  Intimate Partner Violence: Not At Risk  . Fear of Current or Ex-Partner: No  . Emotionally Abused: No  . Physically Abused: No  . Sexually Abused: No    ALLERGIES:    No Known Allergies  CURRENT MEDICATIONS:    Current Outpatient Medications  Medication Sig Dispense Refill  . acetaminophen (TYLENOL) 500 MG tablet Take 1,000 mg by mouth daily as needed (for leg pain).     Marland Kitchen albuterol (VENTOLIN HFA) 108 (90 Base) MCG/ACT inhaler Inhale into the lungs.    Marland Kitchen aspirin EC 81 MG tablet Take 81 mg by mouth every evening.     . clopidogrel (PLAVIX) 75 MG tablet TAKE 1 TABLET BY MOUTH EVERY DAY 90 tablet 3  . Cyanocobalamin (B-12) 1000 MCG SUBL Place 1 tablet under the tongue daily.    Marland Kitchen docusate sodium (COLACE) 100 MG capsule Take  100 mg by mouth daily as needed (for constipation).     . ferrous gluconate (FERGON) 324 MG tablet TAKE 1 TABLET BY MOUTH EVERY DAY WITH BREAKFAST. (Patient taking differently: Take 324 mg by mouth daily with breakfast.) 90 tablet 0  . hydrochlorothiazide (HYDRODIURIL) 25 MG tablet Take 1 tablet (25 mg total) by mouth daily. 90 tablet  3  . isosorbide mononitrate (IMDUR) 30 MG 24 hr tablet TAKE 1 TABLET (30 MG TOTAL) BY MOUTH DAILY. NEED OV. (Patient taking differently: Take 30 mg by mouth daily.) 90 tablet 3  . losartan (COZAAR) 100 MG tablet Take 1 tablet (100 mg total) by mouth daily. 90 tablet 3  . metoprolol tartrate (LOPRESSOR) 50 MG tablet Take 1 tablet (50 mg total) by mouth daily. 90 tablet 3  . NIFEdipine (ADALAT CC) 90 MG 24 hr tablet TAKE 1 TABLET BY MOUTH EVERY DAY (Patient taking differently: Take 90 mg by mouth daily.) 90 tablet 3  . simvastatin (ZOCOR) 20 MG tablet TAKE 1 TABLET (20 MG TOTAL) BY MOUTH DAILY AT 6 PM. NEED OV. 90 tablet 1   No current facility-administered medications for this visit.    REVIEW OF SYSTEMS:   [X]  denotes positive finding, [ ]  denotes negative finding Cardiac  Comments:  Chest pain or chest pressure:    Shortness of breath upon exertion:    Short of breath when lying flat:    Irregular heart rhythm:        Vascular    Pain in calf, thigh, or hip brought on by ambulation:    Pain in feet at night that wakes you up from your sleep:     Blood clot in your veins:    Leg swelling:         Pulmonary    Oxygen at home:    Productive cough:     Wheezing:         Neurologic    Sudden weakness in arms or legs:     Sudden numbness in arms or legs:     Sudden onset of difficulty speaking or slurred speech:    Temporary loss of vision in one eye:  x   Problems with dizziness:         Gastrointestinal    Blood in stool:      Vomited blood:         Genitourinary    Burning when urinating:     Blood in urine:        Psychiatric    Major  depression:         Hematologic    Bleeding problems:    Problems with blood clotting too easily:        Skin    Rashes or ulcers:        Constitutional    Fever or chills:     PHYSICAL EXAM:   Vitals:   03/19/21 1537 03/19/21 1541  BP: (!) 137/58 (!) 145/64  Pulse: 64   Resp: 20   Temp: 98.2 F (36.8 C)   SpO2: 95%   Weight: 133 lb (60.3 kg)   Height: 5' 6.5" (1.689 m)     GENERAL: The patient is a well-nourished male, in no acute distress. The vital signs are documented above. CARDIAC: There is a regular rate and rhythm.  VASCULAR: Nonpalpable pedal pulses PULMONARY: Nonlabored respirations ABDOMEN: Soft and non-tender with normal pitched bowel sounds.  MUSCULOSKELETAL: There are no major deformities or cyanosis. NEUROLOGIC: No focal weakness or paresthesias are detected. SKIN: There are no ulcers or rashes noted. PSYCHIATRIC: The patient has a normal affect.  STUDIES:   I have reviewed the following carotid duplex:  Right Carotid: Velocities in the right ICA are consistent with a 1-39%  stenosis.         Non-hemodynamically significant plaque <50% noted in the  CCA.  Left Carotid: Velocities in the left ICA are consistent with a 60-79%  stenosis.        Non-hemodynamically significant plaque <50% noted in the  CCA. The        ECA appears >50% stenosed.   Vertebrals: Bilateral vertebral arteries demonstrate antegrade flow.  Subclavians: Right subclavian artery flow was disturbed.   ASSESSMENT and PLAN   Likely symptomatic left carotid stenosis: I discussed with the patient that I feel there is a high likelihood that his carotid stenosis is the etiology for his retinal artery occlusion.  I would like to better evaluate this with a CT angiogram of the head and neck to evaluate the lesion and see what the best option for revascularization is.  I will expedite his scan and see him back in my next clinic for discussions of intervention.   He will remain on dual antiplatelet therapy and a statin.   Leia Alf, MD, FACS [facility] [phone] Pager [phone]

## 2021-03-20 ENCOUNTER — Other Ambulatory Visit: Payer: Self-pay | Admitting: *Deleted

## 2021-03-20 ENCOUNTER — Other Ambulatory Visit: Payer: Self-pay | Admitting: Family Medicine

## 2021-03-20 DIAGNOSIS — I6523 Occlusion and stenosis of bilateral carotid arteries: Secondary | ICD-10-CM

## 2021-03-21 ENCOUNTER — Telehealth: Payer: Self-pay | Admitting: Family Medicine

## 2021-03-21 DIAGNOSIS — D509 Iron deficiency anemia, unspecified: Secondary | ICD-10-CM | POA: Diagnosis not present

## 2021-03-21 DIAGNOSIS — I2510 Atherosclerotic heart disease of native coronary artery without angina pectoris: Secondary | ICD-10-CM | POA: Diagnosis not present

## 2021-03-21 DIAGNOSIS — E785 Hyperlipidemia, unspecified: Secondary | ICD-10-CM | POA: Diagnosis not present

## 2021-03-21 DIAGNOSIS — I739 Peripheral vascular disease, unspecified: Secondary | ICD-10-CM | POA: Diagnosis not present

## 2021-03-21 DIAGNOSIS — J329 Chronic sinusitis, unspecified: Secondary | ICD-10-CM | POA: Diagnosis not present

## 2021-03-21 DIAGNOSIS — I10 Essential (primary) hypertension: Secondary | ICD-10-CM | POA: Diagnosis not present

## 2021-03-21 NOTE — Telephone Encounter (Signed)
The patient had 2 falls on Monday  One at 1:40 AM when he went to smoke and the other one she doesn't have the time.  He seems to be okay he just has a bruise on his right elbow and an abrasion on his left elbow but didn't hit his head.  FYI

## 2021-03-23 NOTE — Telephone Encounter (Signed)
Noted  

## 2021-03-26 ENCOUNTER — Ambulatory Visit (INDEPENDENT_AMBULATORY_CARE_PROVIDER_SITE_OTHER): Payer: Medicare Other | Admitting: Surgery

## 2021-03-26 ENCOUNTER — Ambulatory Visit
Admission: RE | Admit: 2021-03-26 | Discharge: 2021-03-26 | Disposition: A | Discharge status: Home / Self Care | Payer: Medicare Other | Source: Ambulatory Visit | Attending: Surgery | Admitting: Surgery

## 2021-03-26 ENCOUNTER — Other Ambulatory Visit: Payer: Self-pay

## 2021-03-26 ENCOUNTER — Encounter: Payer: Self-pay | Admitting: Surgery

## 2021-03-26 VITALS — BP 202/65 | HR 66 | Temp 98.2°F | Resp 20 | Ht 66.5 in | Wt 133.0 lb

## 2021-03-26 DIAGNOSIS — I6523 Occlusion and stenosis of bilateral carotid arteries: Secondary | ICD-10-CM | POA: Diagnosis not present

## 2021-03-26 DIAGNOSIS — I708 Atherosclerosis of other arteries: Secondary | ICD-10-CM | POA: Diagnosis not present

## 2021-03-26 DIAGNOSIS — I63232 Cerebral infarction due to unspecified occlusion or stenosis of left carotid arteries: Secondary | ICD-10-CM | POA: Diagnosis not present

## 2021-03-26 DIAGNOSIS — G459 Transient cerebral ischemic attack, unspecified: Secondary | ICD-10-CM | POA: Diagnosis not present

## 2021-03-26 MED ORDER — IOPAMIDOL (ISOVUE-370) INJECTION 76%
75.0000 mL | Freq: Once | INTRAVENOUS | Status: AC | PRN
  Administered 2021-03-26: 75 mL via INTRAVENOUS

## 2021-03-26 NOTE — H&P (View-Only) (Signed)
 [facility] [place]  Patient name: Earl Castillo MRN: [id number] DOB: 09/01/1941 Sex: male   REASON FOR VISIT:    Follow up  HISOTRY OF PRESENT ILLNESS:    Earl Castillo is a 80 y.o. male that I saw last week for evaluation of a left retinal artery occlusion which occurred in February 2022.  It has been slowly improving.  He did have carotid Dopplers last year that showed 60 to 79% stenosis on the left.  I sent him for CT scan to determine the extent of his stenosis and to see what options were available for revascularization.  He is back today to discuss these results.  Patient has a history of a left right femoral-femoral bypass graft in 2000 by Dr. Hayes.  This was done for an occluded right common iliac artery.  He has subsequently undergone multiple percutaneous interventions by Dr. Berry.  In 2017 I performed a left femoral to above-knee popliteal artery bypass graft with PTFE.  This was found to be occluded in July 2021.  He still has claudication but denies rest pain or nonhealing wounds.   PAST MEDICAL HISTORY:   Past Medical History:  Diagnosis Date  . Anemia   . Angiodysplasia of intestine - small bowel  09/21/2013  . Blood transfusion abn reaction or complication, no procedure mishap    "HgB dropped real low"  . Cancer (HCC)    Skin cancers  . Carotid artery disease (HCC)    status post carotid endarterectomy by Dr. Bill Bauman  . Chicken pox   . Colon polyps 08/2012   Hyperplastic and adenomatous sessile polyps  . Coronary artery disease    CABG'04, Low risk Myoview 2013  . GERD (gastroesophageal reflux disease)   . Hemorrhoid   . History of blood transfusion 08/2013   "HgB dropped real low; related to bleeding from my rectum"  . History of kidney stones    1 removed surgical, passed 2  . Hyperlipidemia   . Hypertension   . 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 Lower GI bleeding 08/2013  . Osteoarthritis    "hips" (05/23/2014)  . Peripheral arterial disease (HCC)    status post left to right fem-fem crossover graft in by Dr. Greg Hays  . Pneumonia    - "years ago"  . Sinus headache    "alot" (05/23/2014)  . Skin exam, screening for cancer    sees Dr. Ned Gross   . Stroke (HCC)   . Vitamin B12 deficiency 11/23/2013     FAMILY HISTORY:   Family History  Problem Relation Age of Onset  . Heart disease Father   . Heart disease Sister   . Coronary artery disease Brother        CABG x 6  . Heart disease Brother   . Coronary artery disease Brother        first MI 43, died at 44 during cardiac surgery  . Coronary artery disease Sister        CABG x 5, RAS  . Heart disease Sister   . Colon cancer Neg Hx     SOCIAL HISTORY:   Social History   Tobacco Use  . Smoking status: Current Some Day Smoker    Packs/day: 0.25    Years: 58.00    Pack years: 14.50    Types: Cigarettes  . Smokeless tobacco: Never Used  . Tobacco comment: rarely smokes now;   does have approx 38 pack hx  of smoking/ does now the LDCT   Substance Use Topics  . Alcohol use: No    Alcohol/week: 0.0 standard drinks     ALLERGIES:   No Known Allergies   CURRENT MEDICATIONS:   Current Outpatient Medications  Medication Sig Dispense Refill  . acetaminophen (TYLENOL) 500 MG tablet Take 1,000 mg by mouth daily as needed (for leg pain).     . albuterol (VENTOLIN HFA) 108 (90 Base) MCG/ACT inhaler Inhale into the lungs.    . aspirin EC 81 MG tablet Take 81 mg by mouth every evening.     . clopidogrel (PLAVIX) 75 MG tablet TAKE 1 TABLET BY MOUTH EVERY DAY 90 tablet 3  . Cyanocobalamin (B-12) 1000 MCG SUBL Place 1 tablet under the tongue daily.    . docusate sodium (COLACE) 100 MG capsule Take 100 mg by mouth daily as needed (for constipation).     . ferrous gluconate (FERGON) 324 MG tablet TAKE 1 TABLET BY MOUTH EVERY DAY WITH BREAKFAST.  (Patient taking differently: Take 324 mg by mouth daily with breakfast.) 90 tablet 0  . hydrochlorothiazide (HYDRODIURIL) 25 MG tablet Take 1 tablet (25 mg total) by mouth daily. 90 tablet 3  . isosorbide mononitrate (IMDUR) 30 MG 24 hr tablet TAKE 1 TABLET (30 MG TOTAL) BY MOUTH DAILY. NEED OV. (Patient taking differently: Take 30 mg by mouth daily.) 90 tablet 3  . losartan (COZAAR) 100 MG tablet Take 1 tablet (100 mg total) by mouth daily. 90 tablet 3  . metoprolol tartrate (LOPRESSOR) 50 MG tablet TAKE 1 TABLET BY MOUTH EVERY DAY 90 tablet 1  . NIFEdipine (ADALAT CC) 90 MG 24 hr tablet TAKE 1 TABLET BY MOUTH EVERY DAY (Patient taking differently: Take 90 mg by mouth daily.) 90 tablet 3  . simvastatin (ZOCOR) 20 MG tablet TAKE 1 TABLET (20 MG TOTAL) BY MOUTH DAILY AT 6 PM. NEED OV. 90 tablet 1   No current facility-administered medications for this visit.    REVIEW OF SYSTEMS:   [X] denotes positive finding, [ ] denotes negative finding Cardiac  Comments:  Chest pain or chest pressure:    Shortness of breath upon exertion:    Short of breath when lying flat:    Irregular heart rhythm:        Vascular    Pain in calf, thigh, or hip brought on by ambulation:    Pain in feet at night that wakes you up from your sleep:     Blood clot in your veins:    Leg swelling:         Pulmonary    Oxygen at home:    Productive cough:     Wheezing:         Neurologic    Sudden weakness in arms or legs:     Sudden numbness in arms or legs:     Sudden onset of difficulty speaking or slurred speech:    Temporary loss of vision in one eye:  x   Problems with dizziness:         Gastrointestinal    Blood in stool:     Vomited blood:         Genitourinary    Burning when urinating:     Blood in urine:        Psychiatric    Major depression:         Hematologic    Bleeding problems:    Problems   with blood clotting too easily:        Skin    Rashes or ulcers:        Constitutional     Fever or chills:      PHYSICAL EXAM:   Vitals:   03/26/21 1441 03/26/21 1443  BP: (!) 172/73 (!) 202/65  Pulse: 66   Resp: 20   Temp: 98.2 F (36.8 C)   SpO2: 96%   Weight: 133 lb (60.3 kg)   Height: 5' 6.5" (1.689 m)     GENERAL: The patient is a well-nourished male, in no acute distress. The vital signs are documented above. CARDIAC: There is a regular rate and rhythm.  PULMONARY: Non-labored respirations MUSCULOSKELETAL: There are no major deformities or cyanosis. NEUROLOGIC: No focal weakness or paresthesias are detected. SKIN: There are no ulcers or rashes noted. PSYCHIATRIC: The patient has a normal affect.  STUDIES:   I have reviewed the CT angiogram of the neck.  It has not formally been read by radiology.  He has soft plaque within the graft common carotid artery which extends up into his bifurcation with severe stenosis.  There is significant calcified plaque that goes up into the brain.  MEDICAL ISSUES:   I discussed with the patient that I feel that his carotid disease is the likely etiology for his left stroke.  I discussed the challenges with revascularization.  He is not a straightforward candidate.  I feel like his lesion goes up too high to be treated with open endarterectomy, unless I were to tack down the plaque at the distal endpoint.  He also has soft plaque and disease in the proximal common carotid artery which will make TCAR challenging.  I did propose the possibility of a hybrid approach where I perform endarterectomy and patch closure of the common carotid artery and then TCAR of the internal carotid.  I am going to review these images with my partners.  The patient has been placed on the schedule for next Friday, June 3.  He will continue aspirin statin and Plavix.  The risks and benefits of the procedure were discussed with the patient.  He understands the risk for stroke bleeding and nerve injury.  All questions were answered.  I will have [name]  evaluate him for cardiac clearance.    Leia Alf, MD, FACS [facility] [phone] Pager [phone]

## 2021-03-26 NOTE — Progress Notes (Signed)
[facility] [place]  Patient name: Earl Castillo MRN: [id number] DOB: 07-Apr-1941 Sex: male   REASON FOR VISIT:    Follow up  HISOTRY OF PRESENT ILLNESS:    Earl Castillo is a 80 y.o. male that I saw last week for evaluation of a left retinal artery occlusion which occurred in February 2022.  It has been slowly improving.  He did have carotid Dopplers last year that showed 60 to 79% stenosis on the left.  I sent him for CT scan to determine the extent of his stenosis and to see what options were available for revascularization.  He is back today to discuss these results.  Patient has a history of a left right femoral-femoral bypass graft in 2000 by [name].  This was done for an occluded right common iliac artery.  He has subsequently undergone multiple percutaneous interventions by [name].  In 2017 I performed a left femoral to above-knee popliteal artery bypass graft with PTFE.  This was found to be occluded in July 2021.  He still has claudication but denies rest pain or nonhealing wounds.   PAST MEDICAL HISTORY:   Past Medical History:  Diagnosis Date  . Anemia   . Angiodysplasia of intestine - small bowel  09/21/2013  . Blood transfusion abn reaction or complication, no procedure mishap    "HgB dropped real low"  . Cancer [facility])    Skin cancers  . Carotid artery disease ([place])    status post carotid endarterectomy by [name]  . Chicken pox   . Colon polyps 08/2012   Hyperplastic and adenomatous sessile polyps  . Coronary artery disease    CABG'04, Low risk Myoview 2013  . GERD (gastroesophageal reflux disease)   . Hemorrhoid   . History of blood transfusion 08/2013   "HgB dropped real low; related to bleeding from my rectum"  . History of kidney stones    1 removed surgical, passed 2  . Hyperlipidemia   . Hypertension   . 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 Lower GI bleeding 08/2013  . Osteoarthritis    "hips" (05/23/2014)  . Peripheral arterial disease ([place])    status post left to right fem-fem crossover graft in by [name]  . Pneumonia    - "years ago"  . Sinus headache    "alot" (05/23/2014)  . Skin exam, screening for cancer    sees [name]   . Stroke ([place])   . Vitamin B12 deficiency 11/23/2013     FAMILY HISTORY:   Family History  Problem Relation Age of Onset  . Heart disease Father   . Heart disease Sister   . Coronary artery disease Brother        CABG x 6  . Heart disease Brother   . Coronary artery disease Brother        first MI 58, died at 19 during cardiac surgery  . Coronary artery disease Sister        CABG x 5, RAS  . Heart disease Sister   . Colon cancer Neg Hx     SOCIAL HISTORY:   Social History   Tobacco Use  . Smoking status: Current Some Day Smoker    Packs/day: 0.25    Years: 58.00    Pack years: 14.50    Types: Cigarettes  . Smokeless tobacco: Never Used  . Tobacco comment: rarely smokes now;  does have approx 38 pack hx  of smoking/ does now the LDCT   Substance Use Topics  . Alcohol use: No    Alcohol/week: 0.0 standard drinks     ALLERGIES:   No Known Allergies   CURRENT MEDICATIONS:   Current Outpatient Medications  Medication Sig Dispense Refill  . acetaminophen (TYLENOL) 500 MG tablet Take 1,000 mg by mouth daily as needed (for leg pain).     Marland Kitchen albuterol (VENTOLIN HFA) 108 (90 Base) MCG/ACT inhaler Inhale into the lungs.    Marland Kitchen aspirin EC 81 MG tablet Take 81 mg by mouth every evening.     . clopidogrel (PLAVIX) 75 MG tablet TAKE 1 TABLET BY MOUTH EVERY DAY 90 tablet 3  . Cyanocobalamin (B-12) 1000 MCG SUBL Place 1 tablet under the tongue daily.    Marland Kitchen docusate sodium (COLACE) 100 MG capsule Take 100 mg by mouth daily as needed (for constipation).     . ferrous gluconate (FERGON) 324 MG tablet TAKE 1 TABLET BY MOUTH EVERY DAY WITH BREAKFAST.  (Patient taking differently: Take 324 mg by mouth daily with breakfast.) 90 tablet 0  . hydrochlorothiazide (HYDRODIURIL) 25 MG tablet Take 1 tablet (25 mg total) by mouth daily. 90 tablet 3  . isosorbide mononitrate (IMDUR) 30 MG 24 hr tablet TAKE 1 TABLET (30 MG TOTAL) BY MOUTH DAILY. NEED OV. (Patient taking differently: Take 30 mg by mouth daily.) 90 tablet 3  . losartan (COZAAR) 100 MG tablet Take 1 tablet (100 mg total) by mouth daily. 90 tablet 3  . metoprolol tartrate (LOPRESSOR) 50 MG tablet TAKE 1 TABLET BY MOUTH EVERY DAY 90 tablet 1  . NIFEdipine (ADALAT CC) 90 MG 24 hr tablet TAKE 1 TABLET BY MOUTH EVERY DAY (Patient taking differently: Take 90 mg by mouth daily.) 90 tablet 3  . simvastatin (ZOCOR) 20 MG tablet TAKE 1 TABLET (20 MG TOTAL) BY MOUTH DAILY AT 6 PM. NEED OV. 90 tablet 1   No current facility-administered medications for this visit.    REVIEW OF SYSTEMS:   [X]  denotes positive finding, [ ]  denotes negative finding Cardiac  Comments:  Chest pain or chest pressure:    Shortness of breath upon exertion:    Short of breath when lying flat:    Irregular heart rhythm:        Vascular    Pain in calf, thigh, or hip brought on by ambulation:    Pain in feet at night that wakes you up from your sleep:     Blood clot in your veins:    Leg swelling:         Pulmonary    Oxygen at home:    Productive cough:     Wheezing:         Neurologic    Sudden weakness in arms or legs:     Sudden numbness in arms or legs:     Sudden onset of difficulty speaking or slurred speech:    Temporary loss of vision in one eye:  x   Problems with dizziness:         Gastrointestinal    Blood in stool:     Vomited blood:         Genitourinary    Burning when urinating:     Blood in urine:        Psychiatric    Major depression:         Hematologic    Bleeding problems:    Problems  with blood clotting too easily:        Skin    Rashes or ulcers:        Constitutional     Fever or chills:      PHYSICAL EXAM:   Vitals:   03/26/21 1441 03/26/21 1443  BP: (!) 172/73 (!) 202/65  Pulse: 66   Resp: 20   Temp: 98.2 F (36.8 C)   SpO2: 96%   Weight: 133 lb (60.3 kg)   Height: 5' 6.5" (1.689 m)     GENERAL: The patient is a well-nourished male, in no acute distress. The vital signs are documented above. CARDIAC: There is a regular rate and rhythm.  PULMONARY: Non-labored respirations MUSCULOSKELETAL: There are no major deformities or cyanosis. NEUROLOGIC: No focal weakness or paresthesias are detected. SKIN: There are no ulcers or rashes noted. PSYCHIATRIC: The patient has a normal affect.  STUDIES:   I have reviewed the CT angiogram of the neck.  It has not formally been read by radiology.  He has soft plaque within the graft common carotid artery which extends up into his bifurcation with severe stenosis.  There is significant calcified plaque that goes up into the brain.  MEDICAL ISSUES:   I discussed with the patient that I feel that his carotid disease is the likely etiology for his left stroke.  I discussed the challenges with revascularization.  He is not a straightforward candidate.  I feel like his lesion goes up too high to be treated with open endarterectomy, unless I were to tack down the plaque at the distal endpoint.  He also has soft plaque and disease in the proximal common carotid artery which will make TCAR challenging.  I did propose the possibility of a hybrid approach where I perform endarterectomy and patch closure of the common carotid artery and then TCAR of the internal carotid.  I am going to review these images with my partners.  The patient has been placed on the schedule for next Friday, June 3.  He will continue aspirin statin and Plavix.  The risks and benefits of the procedure were discussed with the patient.  He understands the risk for stroke bleeding and nerve injury.  All questions were answered.  I will have [name]  evaluate him for cardiac clearance.    Leia Alf, MD, FACS [facility] [phone] Pager [phone]

## 2021-03-27 ENCOUNTER — Telehealth: Payer: Self-pay | Admitting: Cardiovascular Disease

## 2021-03-27 ENCOUNTER — Other Ambulatory Visit: Payer: Self-pay

## 2021-03-27 DIAGNOSIS — I739 Peripheral vascular disease, unspecified: Secondary | ICD-10-CM | POA: Diagnosis not present

## 2021-03-27 DIAGNOSIS — D509 Iron deficiency anemia, unspecified: Secondary | ICD-10-CM | POA: Diagnosis not present

## 2021-03-27 DIAGNOSIS — J329 Chronic sinusitis, unspecified: Secondary | ICD-10-CM | POA: Diagnosis not present

## 2021-03-27 DIAGNOSIS — I2510 Atherosclerotic heart disease of native coronary artery without angina pectoris: Secondary | ICD-10-CM | POA: Diagnosis not present

## 2021-03-27 DIAGNOSIS — I10 Essential (primary) hypertension: Secondary | ICD-10-CM | POA: Diagnosis not present

## 2021-03-27 DIAGNOSIS — E785 Hyperlipidemia, unspecified: Secondary | ICD-10-CM | POA: Diagnosis not present

## 2021-03-27 NOTE — Telephone Encounter (Signed)
      [facility] Pre-operative Risk Assessment    Patient Name: Earl Castillo  DOB: 05/01/1941  MRN: [id number]   HEARTCARE STAFF: - Please ensure there is not already an duplicate clearance open for this procedure. - Under Visit Info/Reason for Call, type in Other and utilize the format Clearance MM/DD/YY or Clearance TBD. Do not use dashes or single digits. - If request is for dental extraction, please clarify the # of teeth to be extracted.  Request for surgical clearance:  1. What type of surgery is being performed? carotid endarterectomy and TCAR  2. When is this surgery scheduled? 04/06/21  3. What type of clearance is required (medical clearance vs. Pharmacy clearance to hold med vs. Both)? Medical/Cardiac clearance  4. Are there any medications that need to be held prior to surgery and how long? n/a  5. Practice name and name of physician performing surgery? Dr. [name] - [facility]  6. What is the office phone number? [phone]    7.   What is the office fax number? [phone]  8.   Anesthesia type (None, local, MAC, general) ? General    Angeline S Hammer 03/27/2021, 1:21 PM  _________________________________________________________________   (provider comments below)

## 2021-03-27 NOTE — Telephone Encounter (Addendum)
   Name: Earl Castillo DOB: [birth year]  MRN: [id number]  Primary Cardiologist: Quay Burow, MD  Left message with son that I will call back after 3 pm.  Pt with PT at this moment. Called back after 3:00 - left message on VM to call back.  Richardson Dopp, PA-C 03/27/2021, 1:50 PM

## 2021-03-28 DIAGNOSIS — E785 Hyperlipidemia, unspecified: Secondary | ICD-10-CM | POA: Diagnosis not present

## 2021-03-28 DIAGNOSIS — J329 Chronic sinusitis, unspecified: Secondary | ICD-10-CM | POA: Diagnosis not present

## 2021-03-28 DIAGNOSIS — I2510 Atherosclerotic heart disease of native coronary artery without angina pectoris: Secondary | ICD-10-CM | POA: Diagnosis not present

## 2021-03-28 DIAGNOSIS — I10 Essential (primary) hypertension: Secondary | ICD-10-CM | POA: Diagnosis not present

## 2021-03-28 DIAGNOSIS — I739 Peripheral vascular disease, unspecified: Secondary | ICD-10-CM | POA: Diagnosis not present

## 2021-03-28 DIAGNOSIS — D509 Iron deficiency anemia, unspecified: Secondary | ICD-10-CM | POA: Diagnosis not present

## 2021-03-28 NOTE — Telephone Encounter (Signed)
Notes faxed to surgeon. This phone note will be removed from the preop pool. Richardson Dopp, PA-C  03/28/2021 2:03 PM

## 2021-03-28 NOTE — Telephone Encounter (Signed)
TCAR is fairly low risk.  When I saw him several months ago he was asymptomatic.  I think we can proceed with procedure without functional testing preoperatively.

## 2021-03-28 NOTE — Telephone Encounter (Signed)
   Name: RONIE BARNHART DOB: 15-Nov-1940  MRN: [id number]  Primary Cardiologist: Quay Burow, MD  Please see notes below and recommendations from [name].  Recommendations: . Based on ACC/AHA guidelines, the patient is at acceptable risk for the planned procedure and may proceed without further cardiovascular testing.    Please call with questions. Richardson Dopp, PA-C 03/28/2021, 1:59 PM

## 2021-03-28 NOTE — Telephone Encounter (Signed)
   Name: Earl Castillo DOB: 1941/08/14  MRN: [id number]  Primary Cardiologist: Quay Burow, MD  Chart reviewed as part of pre-operative protocol coverage.   80 y.o. male with . Coronary artery disease  o S/p CABG in 2004 . Hypertension  . Hyperlipidemia  . Carotid artery dz o S/p R CEA . Peripheral arterial disease  o S/p L-R fem-fem bypass o S/p stent to L SFA in 7/15 o S/p L AK fem-pop bypass in 10/17 . Hx of L ocular stroke . Echocardiogram 9/14: normal EF, no sig valve dz . Myoview 10/13: low risk   Last OV:  12/26/20 with [name] Procedure:  CEA, TCAR Rx:  ?-No hold request made  RCRI:  Perioperative Risk of Major Cardiac Event is (%): 6.6 (high risk) DASI:  Functional Capacity in METs is: 3.73 (functional status is poor)  Patient was contacted 03/28/2021 in reference to pre-operative risk assessment for pending surgery as outlined below.    Since last seen, Earl Castillo has done well without chest pain or shortness of breath.  However, he is quite limited in his activity and uses a walker.  He is unable to achieve 4 METs.  I will review with [name] +/- stress testing for risk stratification.  Please call with questions. Richardson Dopp, PA-C 03/28/2021, 10:42 AM

## 2021-03-29 DIAGNOSIS — I739 Peripheral vascular disease, unspecified: Secondary | ICD-10-CM | POA: Diagnosis not present

## 2021-03-29 DIAGNOSIS — I2510 Atherosclerotic heart disease of native coronary artery without angina pectoris: Secondary | ICD-10-CM | POA: Diagnosis not present

## 2021-03-29 DIAGNOSIS — D509 Iron deficiency anemia, unspecified: Secondary | ICD-10-CM | POA: Diagnosis not present

## 2021-03-29 DIAGNOSIS — I10 Essential (primary) hypertension: Secondary | ICD-10-CM | POA: Diagnosis not present

## 2021-03-29 DIAGNOSIS — J329 Chronic sinusitis, unspecified: Secondary | ICD-10-CM | POA: Diagnosis not present

## 2021-03-29 DIAGNOSIS — E785 Hyperlipidemia, unspecified: Secondary | ICD-10-CM | POA: Diagnosis not present

## 2021-04-03 ENCOUNTER — Other Ambulatory Visit (HOSPITAL_COMMUNITY)
Admission: RE | Admit: 2021-04-03 | Discharge: 2021-04-03 | Disposition: A | Discharge status: Home / Self Care | Payer: Medicare Other | Source: Ambulatory Visit | Attending: Surgery | Admitting: Surgery

## 2021-04-03 DIAGNOSIS — Z01812 Encounter for preprocedural laboratory examination: Secondary | ICD-10-CM | POA: Insufficient documentation

## 2021-04-03 DIAGNOSIS — Z20822 Contact with and (suspected) exposure to covid-19: Secondary | ICD-10-CM | POA: Diagnosis not present

## 2021-04-03 LAB — SARS CORONAVIRUS 2 (TAT 6-24 HRS): SARS Coronavirus 2: NEGATIVE

## 2021-04-04 NOTE — Progress Notes (Signed)
Surgical Instructions    Your procedure is scheduled on Friday June 3rd.  Report to [facility] Main Entrance "A" at 8:15 A.M., then check in with the Admitting office.  Call this number if you have problems the morning of surgery:  [phone]   If you have any questions prior to your surgery date call [phone]: Open Monday-Friday 8am-4pm    Remember:  Do not eat or drink anything after midnight the night before your surgery    Take these medicines the morning of surgery with A SIP OF WATER   aspirin EC 81 MG tablet  clopidogrel (PLAVIX) 75 MG tablet  isosorbide mononitrate (IMDUR) 30 MG 24 hr tablet  metoprolol tartrate (LOPRESSOR) 50 MG tablet  NIFEdipine (ADALAT CC) 90 MG 24 hr tablet IF NEEDED  acetaminophen (TYLENOL) 500 MG tablet   As of today, STOP taking any Aleve, Naproxen, Ibuprofen, Motrin, Advil, Goody's, BC's, all herbal medications, fish oil, and all vitamins.          Do not wear jewelry Do not wear lotions, powders, perfumes/colognes, or deodorant. Do not shave 48 hours prior to surgery.  Men may shave face and neck. Do not bring valuables to the hospital. DO Not wear nail polish, gel polish, artificial nails, or any other type of covering on natural nails including finger and toenails. If patients have artificial nails, gel coating, etc. that need to be removed by a nail salon please have this removed prior to surgery or surgery may need to be canceled/delayed if the surgeon/ anesthesia feels like the patient is unable to be adequately monitored.             [facility] is not responsible for any belongings or valuables.  Do NOT Smoke (Tobacco/Vaping) or drink Alcohol 24 hours prior to your procedure If you use a CPAP at night, you may bring all equipment for your overnight stay.   Contacts, glasses, dentures or bridgework may not be worn into surgery, please bring cases for these belongings   For patients admitted to the hospital, discharge time will be  determined by your treatment team.   Patients discharged the day of surgery will not be allowed to drive home, and someone needs to stay with them for 24 hours.    Special instructions:    Oral Hygiene is also important to reduce your risk of infection.  Remember - BRUSH YOUR TEETH THE MORNING OF SURGERY WITH YOUR REGULAR TOOTHPASTE   Center- Preparing For Surgery  Before surgery, you can play an important role. Because skin is not sterile, your skin needs to be as free of germs as possible. You can reduce the number of germs on your skin by washing with CHG (chlorahexidine gluconate) Soap before surgery.  CHG is an antiseptic cleaner which kills germs and bonds with the skin to continue killing germs even after washing.     Please do not use if you have an allergy to CHG or antibacterial soaps. If your skin becomes reddened/irritated stop using the CHG.  Do not shave (including legs and underarms) for at least 48 hours prior to first CHG shower. It is OK to shave your face.  Please follow these instructions carefully.    1.  Shower the NIGHT BEFORE SURGERY and the MORNING OF SURGERY with CHG Soap.   If you chose to wash your hair, wash your hair first as usual with your normal shampoo. After you shampoo, rinse your hair and body thoroughly to remove the  shampoo.  Then [employer] and genitals (private parts) with your normal soap and rinse thoroughly to remove soap.  2. After that Use CHG Soap as you would any other liquid soap. You can apply CHG directly to the skin and wash gently with a scrungie or a clean washcloth.   3. Apply the CHG Soap to your body ONLY FROM THE NECK DOWN.  Do not use on open wounds or open sores. Avoid contact with your eyes, ears, mouth and genitals (private parts). Wash Face and genitals (private parts)  with your normal soap.   4. Wash thoroughly, paying special attention to the area where your surgery will be performed.  5. Thoroughly rinse your body  with warm water from the neck down.  6. DO NOT shower/wash with your normal soap after using and rinsing off the CHG Soap.  7. Pat yourself dry with a CLEAN TOWEL.  8. Wear CLEAN PAJAMAS to bed the night before surgery  9. Place CLEAN SHEETS on your bed the night before your surgery  10. DO NOT SLEEP WITH PETS.   Day of Surgery:  Take a shower with CHG soap. Wear Clean/Comfortable clothing the morning of surgery Do not apply any deodorants/lotions.   Remember to brush your teeth WITH YOUR REGULAR TOOTHPASTE.   Please read over the following fact sheets that you were given.

## 2021-04-05 ENCOUNTER — Telehealth: Payer: Self-pay | Admitting: Family Medicine

## 2021-04-05 ENCOUNTER — Inpatient Hospital Stay (HOSPITAL_COMMUNITY)
Admission: RE | Admit: 2021-04-05 | Discharge: 2021-04-05 | Disposition: A | Discharge status: Home / Self Care | Payer: Medicare Other | Source: Ambulatory Visit

## 2021-04-05 ENCOUNTER — Other Ambulatory Visit: Payer: Self-pay

## 2021-04-05 NOTE — Telephone Encounter (Signed)
FYI:  Renee,PT w/[facility] is calling to let the provider know that the pt has missed both PT visits this week due to him getting ready to have a procedure done on Friday so he was tested on Tuesday and had to be quarantine until his procedure.

## 2021-04-06 ENCOUNTER — Ambulatory Visit (HOSPITAL_COMMUNITY)
Admission: RE | Admit: 2021-04-06 | Discharge: 2021-04-06 | Disposition: A | Discharge status: Home / Self Care | Payer: Medicare Other | Source: Home / Self Care | Attending: Vascular Surgery | Admitting: Vascular Surgery

## 2021-04-06 ENCOUNTER — Other Ambulatory Visit: Payer: Self-pay

## 2021-04-06 ENCOUNTER — Encounter (HOSPITAL_COMMUNITY)
Admission: RE | Disposition: A | Discharge status: Home / Self Care | Payer: Self-pay | Source: Home / Self Care | Attending: Vascular Surgery

## 2021-04-06 DIAGNOSIS — Z66 Do not resuscitate: Secondary | ICD-10-CM | POA: Diagnosis not present

## 2021-04-06 DIAGNOSIS — R0602 Shortness of breath: Secondary | ICD-10-CM | POA: Diagnosis not present

## 2021-04-06 DIAGNOSIS — R531 Weakness: Secondary | ICD-10-CM | POA: Diagnosis not present

## 2021-04-06 DIAGNOSIS — I739 Peripheral vascular disease, unspecified: Secondary | ICD-10-CM

## 2021-04-06 DIAGNOSIS — Z20822 Contact with and (suspected) exposure to covid-19: Secondary | ICD-10-CM | POA: Diagnosis not present

## 2021-04-06 DIAGNOSIS — I6522 Occlusion and stenosis of left carotid artery: Secondary | ICD-10-CM | POA: Insufficient documentation

## 2021-04-06 DIAGNOSIS — R059 Cough, unspecified: Secondary | ICD-10-CM | POA: Diagnosis not present

## 2021-04-06 DIAGNOSIS — J9811 Atelectasis: Secondary | ICD-10-CM | POA: Diagnosis not present

## 2021-04-06 DIAGNOSIS — I5031 Acute diastolic (congestive) heart failure: Secondary | ICD-10-CM | POA: Diagnosis not present

## 2021-04-06 DIAGNOSIS — Z79899 Other long term (current) drug therapy: Secondary | ICD-10-CM | POA: Insufficient documentation

## 2021-04-06 DIAGNOSIS — E43 Unspecified severe protein-calorie malnutrition: Secondary | ICD-10-CM | POA: Diagnosis not present

## 2021-04-06 DIAGNOSIS — I6521 Occlusion and stenosis of right carotid artery: Secondary | ICD-10-CM | POA: Diagnosis not present

## 2021-04-06 DIAGNOSIS — F17210 Nicotine dependence, cigarettes, uncomplicated: Secondary | ICD-10-CM | POA: Insufficient documentation

## 2021-04-06 DIAGNOSIS — Z0000 Encounter for general adult medical examination without abnormal findings: Secondary | ICD-10-CM

## 2021-04-06 DIAGNOSIS — J189 Pneumonia, unspecified organism: Secondary | ICD-10-CM | POA: Diagnosis not present

## 2021-04-06 DIAGNOSIS — Z7902 Long term (current) use of antithrombotics/antiplatelets: Secondary | ICD-10-CM | POA: Insufficient documentation

## 2021-04-06 DIAGNOSIS — Z7982 Long term (current) use of aspirin: Secondary | ICD-10-CM | POA: Insufficient documentation

## 2021-04-06 DIAGNOSIS — J90 Pleural effusion, not elsewhere classified: Secondary | ICD-10-CM | POA: Diagnosis not present

## 2021-04-06 DIAGNOSIS — I110 Hypertensive heart disease with heart failure: Secondary | ICD-10-CM | POA: Diagnosis not present

## 2021-04-06 DIAGNOSIS — J9601 Acute respiratory failure with hypoxia: Secondary | ICD-10-CM | POA: Diagnosis not present

## 2021-04-06 DIAGNOSIS — I509 Heart failure, unspecified: Secondary | ICD-10-CM | POA: Diagnosis not present

## 2021-04-06 HISTORY — PX: CAROTID ANGIOGRAPHY: CATH118230

## 2021-04-06 LAB — POCT I-STAT, CHEM 8
BUN: 21 mg/dL (ref 8–23)
Calcium, Ion: 1.15 mmol/L (ref 1.15–1.40)
Chloride: 101 mmol/L (ref 98–111)
Creatinine, Ser: 1.1 mg/dL (ref 0.61–1.24)
Glucose, Bld: 95 mg/dL (ref 70–99)
HCT: 30 % — ABNORMAL LOW (ref 39.0–52.0)
Hemoglobin: 10.2 g/dL — ABNORMAL LOW (ref 13.0–17.0)
Potassium: 3.3 mmol/L — ABNORMAL LOW (ref 3.5–5.1)
Sodium: 141 mmol/L (ref 135–145)
TCO2: 26 mmol/L (ref 22–32)

## 2021-04-06 SURGERY — CAROTID ANGIOGRAPHY
Anesthesia: LOCAL

## 2021-04-06 SURGERY — ENDARTERECTOMY, CAROTID
Anesthesia: General

## 2021-04-06 MED ORDER — ONDANSETRON HCL 4 MG/2ML IJ SOLN: 4.0000 mg | Freq: Four times a day (QID) | INTRAMUSCULAR | Status: DC | PRN

## 2021-04-06 MED ORDER — SODIUM CHLORIDE 0.9% FLUSH: 3.0000 mL | INTRAVENOUS | Status: DC | PRN

## 2021-04-06 MED ORDER — OXYCODONE HCL 5 MG PO TABS: 5.0000 mg | ORAL_TABLET | ORAL | Status: DC | PRN

## 2021-04-06 MED ORDER — LABETALOL HCL 5 MG/ML IV SOLN: 10.0000 mg | INTRAVENOUS | Status: DC | PRN

## 2021-04-06 MED ORDER — IODIXANOL 320 MG/ML IV SOLN
INTRAVENOUS | Status: DC | PRN
  Administered 2021-04-06: 137 mL via INTRA_ARTERIAL

## 2021-04-06 MED ORDER — HEPARIN (PORCINE) IN NACL 1000-0.9 UT/500ML-% IV SOLN
INTRAVENOUS | Status: AC
  Filled 2021-04-06: qty 1000

## 2021-04-06 MED ORDER — ACETAMINOPHEN 325 MG PO TABS: 650.0000 mg | ORAL_TABLET | ORAL | Status: DC | PRN

## 2021-04-06 MED ORDER — HYDRALAZINE HCL 20 MG/ML IJ SOLN
INTRAMUSCULAR | Status: AC
  Filled 2021-04-06: qty 1

## 2021-04-06 MED ORDER — SODIUM CHLORIDE 0.9% FLUSH: 3.0000 mL | Freq: Two times a day (BID) | INTRAVENOUS | Status: DC

## 2021-04-06 MED ORDER — SODIUM CHLORIDE 0.9 % IV SOLN: INTRAVENOUS | Status: AC

## 2021-04-06 MED ORDER — HEPARIN (PORCINE) IN NACL 1000-0.9 UT/500ML-% IV SOLN
INTRAVENOUS | Status: DC | PRN
  Administered 2021-04-06 (×2): 500 mL

## 2021-04-06 MED ORDER — LIDOCAINE HCL (PF) 1 % IJ SOLN
INTRAMUSCULAR | Status: DC | PRN
  Administered 2021-04-06: 20 mL via INTRADERMAL

## 2021-04-06 MED ORDER — SODIUM CHLORIDE 0.9 % IV SOLN: INTRAVENOUS | Status: DC

## 2021-04-06 MED ORDER — HYDRALAZINE HCL 20 MG/ML IJ SOLN
INTRAMUSCULAR | Status: DC | PRN
  Administered 2021-04-06 (×2): 10 mg via INTRAVENOUS

## 2021-04-06 MED ORDER — HYDRALAZINE HCL 20 MG/ML IJ SOLN
5.0000 mg | INTRAMUSCULAR | Status: DC | PRN
  Administered 2021-04-06: 5 mg via INTRAVENOUS

## 2021-04-06 MED ORDER — LIDOCAINE HCL (PF) 1 % IJ SOLN
INTRAMUSCULAR | Status: AC
  Filled 2021-04-06: qty 30

## 2021-04-06 MED ORDER — SODIUM CHLORIDE 0.9 % IV SOLN: 250.0000 mL | INTRAVENOUS | Status: DC | PRN

## 2021-04-06 MED ORDER — MORPHINE SULFATE (PF) 2 MG/ML IV SOLN
2.0000 mg | INTRAVENOUS | Status: DC | PRN
Start: 2021-04-06 — End: 2021-04-06

## 2021-04-06 SURGICAL SUPPLY — 8 items
CATH ANGIO 5F PIGTAIL 100CM (CATHETERS) ×2 IMPLANT
KIT MICROPUNCTURE NIT STIFF (SHEATH) ×2 IMPLANT
KIT PV (KITS) ×2 IMPLANT
SHEATH PINNACLE 5F 10CM (SHEATH) ×2 IMPLANT
SYR MEDRAD MARK 7 150ML (SYRINGE) ×2 IMPLANT
TRANSDUCER W/STOPCOCK (MISCELLANEOUS) ×2 IMPLANT
TRAY PV CATH (CUSTOM PROCEDURE TRAY) ×2 IMPLANT
WIRE HITORQ VERSACORE ST 145CM (WIRE) ×2 IMPLANT

## 2021-04-06 NOTE — Op Note (Addendum)
Procedure: Arch aortogram, ultrasound groin, retrograde left common femoral puncture  Preoperative diagnosis: Symptomatic left carotid stenosis  Postoperative diagnosis: Same  Anesthesia: Local  Operative findings: 1.  Severe calcific greater than 90% stenosis left internal carotid artery  2.  Type I aortic arch  3.  50% ostial stenosis left common carotid artery  Operative details: After obtaining informed consent, the patient was taken the PV lab.  The patient was placed in supine position on the angio table.  Both groins were prepped and draped in usual sterile fashion.  Fluoroscopy was used to identify the patient's femoral head.  Ultrasound was used to identify a pre-existing femoral-femoral bypass.  I was able to find an area just above the takeoff of the femoral-femoral bypass and over the center of the femoral head for cannulation.  Using ultrasound guidance I was able to cannulate the left common femoral artery using a micropuncture needle.  Micropuncture wire was advanced in the left iliac system.  The micropuncture sheath was placed over this and the wire then replaced with an 035 versa core wire.  5 French sheath was placed over the guidewire the left common femoral artery.  This was thoroughly flushed with heparinized saline.  5 French pigtail catheter was then advanced over the versa core wire all the way up into the ascending aorta.  In a 40 degree LAO projection an arch aortogram was performed.  This showed normal arch anatomy with no significant stenosis of the innominate right common or right subclavian arteries.  There was no significant left subclavian artery stenosis.  Vertebral arteries were both patent and antegrade.  The left common carotid origin has about a 50% stenosis.  The left common carotid artery is fairly smooth in caliber all the way up to the carotid bifurcation.  There is a heavily calcified plaque just distal to the origin of the left internal carotid artery which  extends all the way to the skull base.  The distal left internal carotid artery does fill although it is small about a 2-1/2 to 3 mm vessel.  I then attempted to take several other views with increased dose opacification of contrast but the images were fairly grainy.  I spoke with Dr. [name] and he felt he had adequate imaging for intervention in the near future if the patient requires this.  At this point the pigtail catheter was removed over guidewire and the 5 French sheath left in place to be pulled in the holding area.  The patient tolerated the procedure well and there were no complications.  The patient was taken the holding area in stable condition.  Ruta Hinds, MD [facility] Office: [phone]

## 2021-04-06 NOTE — Discharge Instructions (Signed)
Femoral Site Care This sheet gives you information about how to care for yourself after your procedure. Your health care provider may also give you more specific instructions. If you have problems or questions, contact your health care provider. What can I expect after the procedure?  After the procedure, it is common to have:  Bruising that usually fades within 1-2 weeks.  Tenderness at the site. Follow these instructions at home: Wound care 1. Follow instructions from your health care provider about how to take care of your insertion site. Make sure you: ? Wash your hands with soap and water before you change your bandage (dressing). If soap and water are not available, use hand sanitizer. ? Remove your dressing as told by your health care provider. In 24 hours 2. Do not take baths, swim, or use a hot tub until your health care provider approves. 3. You may shower 24-48 hours after the procedure or as told by your health care provider. ? Gently wash the site with plain soap and water. ? Pat the area dry with a clean towel. ? Do not rub the site. This may cause bleeding. 4. Do not apply powder or lotion to the site. Keep the site clean and dry. 5. Check your femoral site every day for signs of infection. Check for: ? Redness, swelling, or pain. ? Fluid or blood. ? Warmth. ? Pus or a bad smell. Activity 1. For the first 2-3 days after your procedure, or as long as directed: ? Avoid climbing stairs as much as possible. ? Do not squat. 2. Do not lift anything that is heavier than 10 lb (4.5 kg), or the limit that you are told, until your health care provider says that it is safe. For 5 days 3. Rest as directed. ? Avoid sitting for a long time without moving. Get up to take short walks every 1-2 hours. 4. Do not drive for 24 hours if you were given a medicine to help you relax (sedative). General instructions  Take over-the-counter and prescription medicines only as told by your  health care provider.  Keep all follow-up visits as told by your health care provider. This is important. Contact a health care provider if you have:  A fever or chills.  You have redness, swelling, or pain around your insertion site. Get help right away if:  The catheter insertion area swells very fast.  You pass out.  You suddenly start to sweat or your skin gets clammy.  The catheter insertion area is bleeding, and the bleeding does not stop when you hold steady pressure on the area.  The area near or just beyond the catheter insertion site becomes pale, cool, tingly, or numb. These symptoms may represent a serious problem that is an emergency. Do not wait to see if the symptoms will go away. Get medical help right away. Call your local emergency services (911 in the [place] Do not drive yourself to the hospital. Summary  After the procedure, it is common to have bruising that usually fades within 1-2 weeks.  Check your femoral site every day for signs of infection.  Do not lift anything that is heavier than 10 lb (4.5 kg), or the limit that you are told, until your health care provider says that it is safe. This information is not intended to replace advice given to you by your health care provider. Make sure you discuss any questions you have with your health care provider. Document Revised: 11/03/2017 Document Reviewed: 11/03/2017   Elsevier Patient Education  2020 [employer]   

## 2021-04-06 NOTE — Progress Notes (Signed)
I have reviewed the images from carotid angiography today.  I discussed this with the patient and son and feel that we can proceed with left common carotid endarterectomy and left sided TCAR.  We will schedule this within the next few weeks.  Earl Castillo

## 2021-04-06 NOTE — Interval H&P Note (Signed)
History and Physical Interval Note:  04/06/2021 9:11 AM  Earl Castillo  has presented today for surgery, with the diagnosis of bilateral carotid stenosis.  The various methods of treatment have been discussed with the patient and family. After consideration of risks, benefits and other options for treatment, the patient has consented to  Procedure(s): CAROTID ANGIOGRAPHY (N/A) as a surgical intervention.  The patient's history has been reviewed, patient examined, no change in status, stable for surgery.  I have reviewed the patient's chart and labs.  Questions were answered to the patient's satisfaction.     Ruta Hinds

## 2021-04-06 NOTE — Telephone Encounter (Signed)
fyi

## 2021-04-06 NOTE — Progress Notes (Signed)
Sheath pulled from left femoral artery at 1300. Manual pressure applied to site for 20 min.  Bilateral DP Distal pulses doppled before and after sheath removed. No complications.  Post sheath removal instructions given.  Patient understands.  Tegaderm dressing applied to left groin.  No hematoma or bruising.

## 2021-04-09 ENCOUNTER — Emergency Department (HOSPITAL_COMMUNITY): Payer: Medicare Other

## 2021-04-09 ENCOUNTER — Encounter (HOSPITAL_COMMUNITY): Payer: Self-pay | Admitting: Vascular Surgery

## 2021-04-09 ENCOUNTER — Inpatient Hospital Stay (HOSPITAL_COMMUNITY)
Admission: EM | Admit: 2021-04-09 | Discharge: 2021-04-16 | DRG: 067 | Disposition: A | Discharge status: Skilled Nursing Facility | Payer: Medicare Other | Attending: Family Medicine | Admitting: Family Medicine

## 2021-04-09 ENCOUNTER — Other Ambulatory Visit: Payer: Self-pay

## 2021-04-09 ENCOUNTER — Inpatient Hospital Stay (HOSPITAL_COMMUNITY): Payer: Medicare Other

## 2021-04-09 DIAGNOSIS — Z951 Presence of aortocoronary bypass graft: Secondary | ICD-10-CM

## 2021-04-09 DIAGNOSIS — H47012 Ischemic optic neuropathy, left eye: Secondary | ICD-10-CM | POA: Diagnosis not present

## 2021-04-09 DIAGNOSIS — R059 Cough, unspecified: Secondary | ICD-10-CM | POA: Diagnosis not present

## 2021-04-09 DIAGNOSIS — I739 Peripheral vascular disease, unspecified: Secondary | ICD-10-CM | POA: Diagnosis present

## 2021-04-09 DIAGNOSIS — R296 Repeated falls: Secondary | ICD-10-CM | POA: Diagnosis not present

## 2021-04-09 DIAGNOSIS — I2510 Atherosclerotic heart disease of native coronary artery without angina pectoris: Secondary | ICD-10-CM | POA: Diagnosis present

## 2021-04-09 DIAGNOSIS — E43 Unspecified severe protein-calorie malnutrition: Secondary | ICD-10-CM | POA: Diagnosis present

## 2021-04-09 DIAGNOSIS — D3131 Benign neoplasm of right choroid: Secondary | ICD-10-CM | POA: Diagnosis not present

## 2021-04-09 DIAGNOSIS — M1990 Unspecified osteoarthritis, unspecified site: Secondary | ICD-10-CM | POA: Diagnosis present

## 2021-04-09 DIAGNOSIS — Z79899 Other long term (current) drug therapy: Secondary | ICD-10-CM

## 2021-04-09 DIAGNOSIS — I700 Atherosclerosis of aorta: Secondary | ICD-10-CM | POA: Diagnosis present

## 2021-04-09 DIAGNOSIS — J984 Other disorders of lung: Secondary | ICD-10-CM | POA: Diagnosis not present

## 2021-04-09 DIAGNOSIS — R278 Other lack of coordination: Secondary | ICD-10-CM | POA: Diagnosis not present

## 2021-04-09 DIAGNOSIS — I6521 Occlusion and stenosis of right carotid artery: Secondary | ICD-10-CM | POA: Diagnosis not present

## 2021-04-09 DIAGNOSIS — R262 Difficulty in walking, not elsewhere classified: Secondary | ICD-10-CM | POA: Diagnosis not present

## 2021-04-09 DIAGNOSIS — Z85828 Personal history of other malignant neoplasm of skin: Secondary | ICD-10-CM | POA: Diagnosis not present

## 2021-04-09 DIAGNOSIS — R0902 Hypoxemia: Secondary | ICD-10-CM | POA: Diagnosis not present

## 2021-04-09 DIAGNOSIS — Z6820 Body mass index (BMI) 20.0-20.9, adult: Secondary | ICD-10-CM

## 2021-04-09 DIAGNOSIS — N179 Acute kidney failure, unspecified: Secondary | ICD-10-CM | POA: Diagnosis present

## 2021-04-09 DIAGNOSIS — F17210 Nicotine dependence, cigarettes, uncomplicated: Secondary | ICD-10-CM | POA: Diagnosis present

## 2021-04-09 DIAGNOSIS — R2681 Unsteadiness on feet: Secondary | ICD-10-CM | POA: Diagnosis not present

## 2021-04-09 DIAGNOSIS — Z20822 Contact with and (suspected) exposure to covid-19: Secondary | ICD-10-CM | POA: Diagnosis present

## 2021-04-09 DIAGNOSIS — R5381 Other malaise: Secondary | ICD-10-CM | POA: Diagnosis not present

## 2021-04-09 DIAGNOSIS — J9601 Acute respiratory failure with hypoxia: Secondary | ICD-10-CM | POA: Diagnosis not present

## 2021-04-09 DIAGNOSIS — Z8249 Family history of ischemic heart disease and other diseases of the circulatory system: Secondary | ICD-10-CM | POA: Diagnosis not present

## 2021-04-09 DIAGNOSIS — K219 Gastro-esophageal reflux disease without esophagitis: Secondary | ICD-10-CM | POA: Diagnosis present

## 2021-04-09 DIAGNOSIS — Z7982 Long term (current) use of aspirin: Secondary | ICD-10-CM

## 2021-04-09 DIAGNOSIS — I509 Heart failure, unspecified: Secondary | ICD-10-CM | POA: Diagnosis not present

## 2021-04-09 DIAGNOSIS — I10 Essential (primary) hypertension: Secondary | ICD-10-CM | POA: Diagnosis present

## 2021-04-09 DIAGNOSIS — D649 Anemia, unspecified: Secondary | ICD-10-CM | POA: Diagnosis not present

## 2021-04-09 DIAGNOSIS — R531 Weakness: Secondary | ICD-10-CM | POA: Diagnosis not present

## 2021-04-09 DIAGNOSIS — I6522 Occlusion and stenosis of left carotid artery: Secondary | ICD-10-CM | POA: Diagnosis present

## 2021-04-09 DIAGNOSIS — Z9181 History of falling: Secondary | ICD-10-CM | POA: Diagnosis not present

## 2021-04-09 DIAGNOSIS — R41841 Cognitive communication deficit: Secondary | ICD-10-CM | POA: Diagnosis not present

## 2021-04-09 DIAGNOSIS — I110 Hypertensive heart disease with heart failure: Secondary | ICD-10-CM | POA: Diagnosis present

## 2021-04-09 DIAGNOSIS — E785 Hyperlipidemia, unspecified: Secondary | ICD-10-CM | POA: Diagnosis present

## 2021-04-09 DIAGNOSIS — M6281 Muscle weakness (generalized): Secondary | ICD-10-CM | POA: Diagnosis not present

## 2021-04-09 DIAGNOSIS — I5031 Acute diastolic (congestive) heart failure: Secondary | ICD-10-CM | POA: Diagnosis not present

## 2021-04-09 DIAGNOSIS — J90 Pleural effusion, not elsewhere classified: Secondary | ICD-10-CM | POA: Diagnosis not present

## 2021-04-09 DIAGNOSIS — W19XXXA Unspecified fall, initial encounter: Secondary | ICD-10-CM | POA: Diagnosis present

## 2021-04-09 DIAGNOSIS — D519 Vitamin B12 deficiency anemia, unspecified: Secondary | ICD-10-CM | POA: Diagnosis not present

## 2021-04-09 DIAGNOSIS — R918 Other nonspecific abnormal finding of lung field: Secondary | ICD-10-CM | POA: Diagnosis not present

## 2021-04-09 DIAGNOSIS — Z66 Do not resuscitate: Secondary | ICD-10-CM | POA: Diagnosis present

## 2021-04-09 DIAGNOSIS — J189 Pneumonia, unspecified organism: Secondary | ICD-10-CM

## 2021-04-09 DIAGNOSIS — Z7902 Long term (current) use of antithrombotics/antiplatelets: Secondary | ICD-10-CM

## 2021-04-09 DIAGNOSIS — J9811 Atelectasis: Secondary | ICD-10-CM | POA: Diagnosis present

## 2021-04-09 DIAGNOSIS — Z8673 Personal history of transient ischemic attack (TIA), and cerebral infarction without residual deficits: Secondary | ICD-10-CM | POA: Diagnosis not present

## 2021-04-09 DIAGNOSIS — I2721 Secondary pulmonary arterial hypertension: Secondary | ICD-10-CM | POA: Diagnosis present

## 2021-04-09 DIAGNOSIS — R202 Paresthesia of skin: Secondary | ICD-10-CM | POA: Diagnosis not present

## 2021-04-09 DIAGNOSIS — I160 Hypertensive urgency: Secondary | ICD-10-CM | POA: Diagnosis present

## 2021-04-09 DIAGNOSIS — R0602 Shortness of breath: Secondary | ICD-10-CM | POA: Diagnosis not present

## 2021-04-09 DIAGNOSIS — Z743 Need for continuous supervision: Secondary | ICD-10-CM | POA: Diagnosis not present

## 2021-04-09 HISTORY — DX: Heart failure, unspecified: I50.9

## 2021-04-09 LAB — COMPREHENSIVE METABOLIC PANEL
ALT: 10 U/L (ref 0–44)
AST: 15 U/L (ref 15–41)
Albumin: 3 g/dL — ABNORMAL LOW (ref 3.5–5.0)
Alkaline Phosphatase: 51 U/L (ref 38–126)
Anion gap: 7 (ref 5–15)
BUN: 15 mg/dL (ref 8–23)
CO2: 27 mmol/L (ref 22–32)
Calcium: 8.5 mg/dL — ABNORMAL LOW (ref 8.9–10.3)
Chloride: 107 mmol/L (ref 98–111)
Creatinine, Ser: 0.86 mg/dL (ref 0.61–1.24)
GFR, Estimated: >60 mL/min (ref >60)
Glucose, Bld: 116 mg/dL — ABNORMAL HIGH (ref 70–99)
Potassium: 3.5 mmol/L (ref 3.5–5.1)
Sodium: 141 mmol/L (ref 135–145)
Total Bilirubin: 0.6 mg/dL (ref 0.3–1.2)
Total Protein: 6.5 g/dL (ref 6.5–8.1)

## 2021-04-09 LAB — CBC WITH DIFFERENTIAL/PLATELET
Abs Immature Granulocytes: 0.03 10*3/uL (ref 0.00–0.07)
Basophils Absolute: 0 10*3/uL (ref 0.0–0.1)
Basophils Relative: 0 %
Eosinophils Absolute: 0 10*3/uL (ref 0.0–0.5)
Eosinophils Relative: 0 %
HCT: 34.3 % — ABNORMAL LOW (ref 39.0–52.0)
Hemoglobin: 10.7 g/dL — ABNORMAL LOW (ref 13.0–17.0)
Immature Granulocytes: 0 %
Lymphocytes Relative: 11 %
Lymphs Abs: 0.8 10*3/uL (ref 0.7–4.0)
MCH: 32.7 pg (ref 26.0–34.0)
MCHC: 31.2 g/dL (ref 30.0–36.0)
MCV: 104.9 fL — ABNORMAL HIGH (ref 80.0–100.0)
Monocytes Absolute: 0.6 10*3/uL (ref 0.1–1.0)
Monocytes Relative: 8 %
Neutro Abs: 5.9 10*3/uL (ref 1.7–7.7)
Neutrophils Relative %: 81 %
Platelets: 201 10*3/uL (ref 150–400)
RBC: 3.27 MIL/uL — ABNORMAL LOW (ref 4.22–5.81)
RDW: 13.4 % (ref 11.5–15.5)
WBC: 7.4 10*3/uL (ref 4.0–10.5)
nRBC: 0 % (ref 0.0–0.2)

## 2021-04-09 LAB — URINALYSIS, ROUTINE W REFLEX MICROSCOPIC
Bacteria, UA: NONE SEEN
Bilirubin Urine: NEGATIVE
Glucose, UA: NEGATIVE mg/dL
Ketones, ur: NEGATIVE mg/dL
Leukocytes,Ua: NEGATIVE
Nitrite: NEGATIVE
Protein, ur: 100 mg/dL — AB
Specific Gravity, Urine: 1.016 (ref 1.005–1.030)
pH: 6 (ref 5.0–8.0)

## 2021-04-09 LAB — TROPONIN I (HIGH SENSITIVITY)
Troponin I (High Sensitivity): 19 ng/L — ABNORMAL HIGH (ref <18)
Troponin I (High Sensitivity): 19 ng/L — ABNORMAL HIGH (ref <18)

## 2021-04-09 LAB — RESP PANEL BY RT-PCR (FLU A&B, COVID) ARPGX2
Influenza A by PCR: NEGATIVE
Influenza B by PCR: NEGATIVE
SARS Coronavirus 2 by RT PCR: NEGATIVE

## 2021-04-09 LAB — BRAIN NATRIURETIC PEPTIDE: B Natriuretic Peptide: 622.8 pg/mL — ABNORMAL HIGH (ref 0.0–100.0)

## 2021-04-09 MED ORDER — DOCUSATE SODIUM 100 MG PO CAPS
100.0000 mg | ORAL_CAPSULE | Freq: Every day | ORAL | Status: DC | PRN
  Administered 2021-04-11: 100 mg via ORAL
  Filled 2021-04-09: qty 1

## 2021-04-09 MED ORDER — METOPROLOL TARTRATE 50 MG PO TABS
50.0000 mg | ORAL_TABLET | Freq: Every morning | ORAL | Status: DC
  Administered 2021-04-10 – 2021-04-16 (×6): 50 mg via ORAL
  Filled 2021-04-09 (×4): qty 1
  Filled 2021-04-09: qty 2
  Filled 2021-04-09 (×2): qty 1

## 2021-04-09 MED ORDER — SODIUM CHLORIDE 0.9 % IV SOLN
1.0000 g | INTRAVENOUS | Status: DC
  Administered 2021-04-10 – 2021-04-12 (×3): 1 g via INTRAVENOUS
  Filled 2021-04-09 (×2): qty 10
  Filled 2021-04-09: qty 1
  Filled 2021-04-09: qty 10

## 2021-04-09 MED ORDER — SODIUM CHLORIDE 0.9% FLUSH
3.0000 mL | Freq: Two times a day (BID) | INTRAVENOUS | Status: DC
  Administered 2021-04-09 – 2021-04-16 (×14): 3 mL via INTRAVENOUS

## 2021-04-09 MED ORDER — NIFEDIPINE ER OSMOTIC RELEASE 60 MG PO TB24
90.0000 mg | ORAL_TABLET | Freq: Every morning | ORAL | Status: DC
  Administered 2021-04-10 – 2021-04-16 (×6): 90 mg via ORAL
  Filled 2021-04-09 (×7): qty 1

## 2021-04-09 MED ORDER — FUROSEMIDE 10 MG/ML IJ SOLN
40.0000 mg | Freq: Two times a day (BID) | INTRAMUSCULAR | Status: DC
  Administered 2021-04-09 – 2021-04-12 (×6): 40 mg via INTRAVENOUS
  Filled 2021-04-09 (×6): qty 4

## 2021-04-09 MED ORDER — HYDRALAZINE HCL 20 MG/ML IJ SOLN
10.0000 mg | Freq: Three times a day (TID) | INTRAMUSCULAR | Status: DC | PRN
  Administered 2021-04-10: 10 mg via INTRAVENOUS
  Filled 2021-04-09: qty 1

## 2021-04-09 MED ORDER — ACETAMINOPHEN 325 MG PO TABS: 650.0000 mg | ORAL_TABLET | Freq: Four times a day (QID) | ORAL | Status: DC | PRN

## 2021-04-09 MED ORDER — CHLORHEXIDINE GLUCONATE CLOTH 2 % EX PADS: 6.0000 | MEDICATED_PAD | Freq: Once | CUTANEOUS | Status: DC

## 2021-04-09 MED ORDER — SODIUM CHLORIDE 0.9 % IV SOLN
1.0000 g | Freq: Once | INTRAVENOUS | Status: AC
  Administered 2021-04-09: 1 g via INTRAVENOUS
  Filled 2021-04-09: qty 10

## 2021-04-09 MED ORDER — IOHEXOL 350 MG/ML SOLN
100.0000 mL | Freq: Once | INTRAVENOUS | Status: AC | PRN
  Administered 2021-04-09: 75 mL via INTRAVENOUS

## 2021-04-09 MED ORDER — ISOSORBIDE MONONITRATE ER 30 MG PO TB24
30.0000 mg | ORAL_TABLET | Freq: Every morning | ORAL | Status: DC
  Administered 2021-04-10 – 2021-04-16 (×7): 30 mg via ORAL
  Filled 2021-04-09 (×7): qty 1

## 2021-04-09 MED ORDER — SODIUM CHLORIDE 0.9 % IV SOLN
500.0000 mg | Freq: Once | INTRAVENOUS | Status: AC
  Administered 2021-04-09: 500 mg via INTRAVENOUS
  Filled 2021-04-09: qty 500

## 2021-04-09 MED ORDER — SIMVASTATIN 20 MG PO TABS
20.0000 mg | ORAL_TABLET | Freq: Every day | ORAL | Status: DC
  Administered 2021-04-09 – 2021-04-15 (×7): 20 mg via ORAL
  Filled 2021-04-09 (×7): qty 1

## 2021-04-09 MED ORDER — ACETAMINOPHEN 650 MG RE SUPP: 650.0000 mg | Freq: Four times a day (QID) | RECTAL | Status: DC | PRN

## 2021-04-09 MED ORDER — SODIUM CHLORIDE 0.9 % IV SOLN: INTRAVENOUS | Status: DC

## 2021-04-09 MED ORDER — CLOPIDOGREL BISULFATE 75 MG PO TABS
75.0000 mg | ORAL_TABLET | Freq: Every morning | ORAL | Status: DC
  Administered 2021-04-10 – 2021-04-16 (×7): 75 mg via ORAL
  Filled 2021-04-09 (×7): qty 1

## 2021-04-09 MED ORDER — SODIUM CHLORIDE 0.9 % IV SOLN: 500.0000 mg | INTRAVENOUS | Status: DC

## 2021-04-09 MED ORDER — CYANOCOBALAMIN 250 MCG PO TABS
500.0000 ug | ORAL_TABLET | Freq: Every morning | ORAL | Status: DC
  Administered 2021-04-10 – 2021-04-16 (×7): 500 ug via ORAL
  Filled 2021-04-09 (×3): qty 2
  Filled 2021-04-09: qty 1
  Filled 2021-04-09: qty 2

## 2021-04-09 MED ORDER — HEPARIN SODIUM (PORCINE) 5000 UNIT/ML IJ SOLN
5000.0000 [IU] | Freq: Three times a day (TID) | INTRAMUSCULAR | Status: DC
  Administered 2021-04-09 – 2021-04-16 (×20): 5000 [IU] via SUBCUTANEOUS
  Filled 2021-04-09 (×20): qty 1

## 2021-04-09 MED ORDER — FUROSEMIDE 10 MG/ML IJ SOLN
40.0000 mg | Freq: Once | INTRAMUSCULAR | Status: AC
  Administered 2021-04-09: 40 mg via INTRAVENOUS
  Filled 2021-04-09: qty 4

## 2021-04-09 MED ORDER — FERROUS GLUCONATE 324 (38 FE) MG PO TABS
324.0000 mg | ORAL_TABLET | Freq: Every day | ORAL | Status: DC
  Administered 2021-04-10 – 2021-04-16 (×7): 324 mg via ORAL
  Filled 2021-04-09 (×7): qty 1

## 2021-04-09 MED ORDER — CEFAZOLIN SODIUM-DEXTROSE 2-4 GM/100ML-% IV SOLN: 2.0000 g | INTRAVENOUS | Status: DC

## 2021-04-09 MED ORDER — ASPIRIN EC 81 MG PO TBEC
81.0000 mg | DELAYED_RELEASE_TABLET | Freq: Every evening | ORAL | Status: DC
  Administered 2021-04-09 – 2021-04-15 (×6): 81 mg via ORAL
  Filled 2021-04-09 (×7): qty 1

## 2021-04-09 NOTE — ED Notes (Signed)
Fall bundle Yellow Wrist band  Bed alarm on Fall risk sign on pt's door

## 2021-04-09 NOTE — ED Notes (Signed)
Patient transported to X-ray 

## 2021-04-09 NOTE — ED Provider Notes (Signed)
I provided a substantive portion of the care of this patient.  I personally performed the entirety of the history, exam and medical decision making for this encounter.  EKG Interpretation  Date/Time:  Monday April 09 2021 10:01:08 EDT Ventricular Rate:  64 PR Interval:  148 QRS Duration: 98 QT Interval:  451 QTC Calculation: 466 R Axis:   23 Text Interpretation: Sinus rhythm Abnormal R-wave progression, early transition LVH with secondary repolarization abnormality Baseline wander Otherwise no significant change Confirmed by Fredia Sorrow [phone]) on 04/09/2021 11:21:18 AM  Patient seen by me along with physician assistant.  Patient recently a femoral vein vascular procedure that went up and looked at the arch of his aorta and the entrance into the carotid arteries.  Patient comes in today from home had 2 falls in the past 24 hours.  Family assisted him up after the second fall.  The vascular procedure was on Friday.  Complaining weakness post surgery patient able to stand using a walker with EMS.  Patient also with shortness of breath when walking.  Oxygen sats according to EMS was 89% on room air.  When patient was started on 3 L and his oxygen sats were 98%.  Patient does not normally use oxygen at home.  Work-up here chest x-ray raised some questions of of pneumonia.  CT angio was done no pulmonary embolus.  That shows some consistency of pulmonary edema as well as possible upper lobe pneumonias.  Patient started on antibiotics for the pneumonia.  Will require admission.  Both could explain his hypoxia.  Patient is comfortable on the oxygen.  Patient's initial troponin 19.  No leukocytosis.  Hemoglobin 10.  Repeat troponin was 19 as well BNP was elevated at 622.     Fredia Sorrow, MD 04/09/21 [phone]

## 2021-04-09 NOTE — H&P (Signed)
History and Physical        Hospital Admission Note Date: 04/09/2021  Patient name: Earl Castillo Medical record number: [id number] Date of birth: 10-03-1941 Age: 80 y.o. Gender: male  PCP: [name], MD  Patient coming from: home Lives with: son At baseline, ambulates: walker  Chief Complaint    Chief Complaint  Patient presents with  . Fall      HPI:   This is a 80 year old male with past medical history of hypertension, hyperlipidemia, CAD s/p CABG, GERD, PAD s/p left right femoral-femoral bypass graft in 2000 and recent left retinal artery occlusion in February 2022, recurrent falls, carotid artery disease s/p recent arch/cerebral angiogram on 6/3 with Dr. [name] via left common femoral puncture who presented to the ED with multiple falls over the past 2 days.  Patient ambulates with his walker regularly but feels like his legs give out from under him causing him to fall.  This has been occurring multiple times per his niece at bedside over the past several months but has increased in frequency it seems over the past couple days.  This occurs while walking and does not occur when going from sitting to standing.  Denies any loss of consciousness, dizziness/lightheadedness or change in vision.  He does, however, admit to left lower extremity numbness/decreased sensation over the past 3 weeks or so which is intermittent.  He denies any trauma other than his falls and denies any back pain, urinary or bowel complaints.  Additionally, he has been increasingly short of breath for the past several weeks as well.  Denies any weight gain or lower extremity edema.  He has had a productive cough though.  Denies any fever, chills, night sweats, sick contacts or any other complaints.  ED Course: Afebrile, hypertensive, hypoxic (SpO2 89% on room air) placed on 2 L/min.  CMP unremarkable, BNP  622, troponin 19x2, WBC 7.4, Hb 10.7, platelets 201, COVID-19 and flu negative. Notable Imaging: CTA chest-negative for PE, findings of CHF with right greater than left pleural effusions, difficult to exclude superimposed pneumonia in the right upper and right lower lobes, enlarged pulmonary trunk indicative of pulmonary arterial hypertension, otherwise chronic findings. Patient received Lasix 40 mg IV x1, ceftriaxone, azithromycin.    Vitals:   04/09/21 1345 04/09/21 1430  BP: (!) 176/150 (!) 187/63  Pulse: 68 70  Resp: 18 20  Temp:    SpO2: 98% 96%     Review of Systems:  Review of Systems  All other systems reviewed and are negative.   Medical/Social/Family History   Past Medical History: Past Medical History:  Diagnosis Date  . Anemia   . Angiodysplasia of intestine - small bowel  09/21/2013  . Blood transfusion abn reaction or complication, no procedure mishap    "HgB dropped real low"  . Cancer [facility])    Skin cancers  . Carotid artery disease ([place])    status post carotid endarterectomy by [name]  . Chicken pox   . Colon polyps 08/2012   Hyperplastic and adenomatous sessile polyps  . Coronary artery disease    CABG'04, Low risk Myoview 2013  . GERD (gastroesophageal reflux disease)   . Hemorrhoid   .  History of blood transfusion 08/2013   "HgB dropped real low; related to bleeding from my rectum"  . History of kidney stones    1 removed surgical, passed 2  . Hyperlipidemia   . Hypertension   . 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 Lower GI bleeding 08/2013  . Osteoarthritis    "hips" (05/23/2014)  . Peripheral arterial disease ([place])    status post left to right fem-fem crossover graft in by [name]  . Pneumonia    - "years ago"  . Sinus headache    "alot" (05/23/2014)  . Skin exam, screening for cancer    sees [name]   . Stroke ([place])   . Vitamin B12 deficiency 11/23/2013     Past Surgical History:  Procedure Laterality Date  . BAND HEMORRHOIDECTOMY    . BASAL CELL CARCINOMA EXCISION  May 2009   per [name] , left nose   . basket extraction of ureteral stone  1970's  . Bypass rt leg Right 2000  . capsule endoscopy  10-05-13   per [name], 4 AVM's  . CARDIAC CATHETERIZATION  08/18/2003   Recommend CABG  . CARDIOVASCULAR STRESS TEST - LEXISCAN  08/25/2012   No significant wall motion abnormalities  . CAROTID ANGIOGRAM N/A 03/17/2014   Procedure: CAROTID ANGIOGRAM;  Surgeon: [name], MD;  Location: [facility] CATH LAB;  Service: Cardiovascular;  Laterality: N/A;  right carotid  . CAROTID ANGIOGRAPHY N/A 04/06/2021   Procedure: CAROTID ANGIOGRAPHY;  Surgeon: Elam Dutch, MD;  Location: [place] CV LAB;  Service: Cardiovascular;  Laterality: N/A;  . CAROTID ENDARTERECTOMY Right   . CAROTID ENDARTERECTOMY Right 03/26/2005   Dr Georgina Quint  . CATARACT EXTRACTION W/ INTRAOCULAR LENS  IMPLANT, BILATERAL Bilateral 2014   per [name]  . [place]   "bulging disc"  . COLONOSCOPY  08-12-12   per [name], tubular adenomas, repeat in 5 yrs   . CORONARY ARTERY BYPASS GRAFT  08/30/2003   x3. LIMA-distal LAD, RIMA-circumflex, SVG-distal RCA  . CYSTOSCOPY W/ STONE MANIPULATION    . ESOPHAGOGASTRODUODENOSCOPY N/A 08/17/2013   Procedure: ESOPHAGOGASTRODUODENOSCOPY (EGD);  Surgeon: [name], MD;  Location: [facility] ENDOSCOPY;  Service: Endoscopy;  Laterality: N/A;  . FEMORAL ARTERY STENT Left 05/23/2014  . FEMORAL-FEMORAL BYPASS GRAFT  2003  . FEMORAL-POPLITEAL BYPASS GRAFT Left 08/09/2016   Procedure: BYPASS GRAFT LEFT FEMORAL-POPLITEAL ARTERY USING GORE 6MM X 80CM PROPATEN GRAFT;  Surgeon: Serafina Mitchell, MD;  Location: [place];  Service: Vascular;  Laterality: Left;  . LOWER EXTREMITY ANGIOGRAM N/A 03/17/2014   Procedure: LOWER EXTREMITY ANGIOGRAM;  Surgeon: [name], MD;  Location[facility] C[facility] LAB;  Service:  Cardiovascular;  Laterality: N/A;  . PERIPHERAL VASCULAR CATHETERIZATION N/A 06/06/2016   Procedure: Lower Extremity Angiography;  Surgeon: [name], MD;  Location: [place] CV LAB;  Service: Cardiovascular;  Laterality: N/A;  . PERIPHERAL VASCULAR CATHETERIZATION N/A 06/06/2016   Procedure: Abdominal Aortogram;  Surgeon: [name], MD;  Location: [place] CV LAB;  Service: Cardiovascular;  Laterality: N/A;  . RENAL DUPLEX Bilateral 02/02/2013   Bilateral renal arteries demonstrate narrowingconsistent with a 60-99% diameter reduction.  . TRANSTHORACIC ECHOCARDIOGRAM  08/02/2013   EF 55-60%, normal echocardiogram    Medications: Prior to Admission medications   Medication Sig Start Date End Date Taking? Authorizing Provider  acetaminophen (TYLENOL) 500 MG tablet Take 1,000 mg by mouth  daily as needed (for leg pain).     [provider]  aspirin EC 81 MG tablet Take 81 mg by mouth every evening.     [provider]  clopidogrel (PLAVIX) 75 MG tablet TAKE 1 TABLET BY MOUTH EVERY DAY Patient taking differently: Take 75 mg by mouth in the morning. 03/05/21   [name], MD  docusate sodium (COLACE) 100 MG capsule Take 100 mg by mouth daily as needed (for constipation).  08/19/13   Debbe Odea, MD  ferrous gluconate (FERGON) 324 MG tablet TAKE 1 TABLET BY MOUTH EVERY DAY WITH BREAKFAST. Patient taking differently: Take 324 mg by mouth daily with breakfast. 12/15/20   [name], MD  isosorbide mononitrate (IMDUR) 30 MG 24 hr tablet TAKE 1 TABLET (30 MG TOTAL) BY MOUTH DAILY. NEED OV. Patient taking differently: Take 30 mg by mouth in the morning. 01/11/21   [name], MD  losartan-hydrochlorothiazide (HYZAAR) 100-25 MG tablet Take 1 tablet by mouth every evening.    [provider]  metoprolol tartrate (LOPRESSOR) 50 MG tablet TAKE 1 TABLET BY MOUTH EVERY DAY Patient taking differently: Take 50 mg by mouth in the morning. 03/22/21   [name], MD  NIFEdipine (ADALAT CC) 90 MG 24 hr tablet TAKE 1 TABLET BY MOUTH EVERY DAY Patient taking differently: Take 90 mg by mouth in the morning. 12/14/20   [name], MD  simvastatin (ZOCOR) 20 MG tablet TAKE 1 TABLET (20 MG TOTAL) BY MOUTH DAILY AT 6 PM. NEED OV. Patient taking differently: Take 20 mg by mouth at bedtime. NEED OV. 03/13/21   [name], MD  vitamin B-12 (CYANOCOBALAMIN) 500 MCG tablet Take 500 mcg by mouth in the morning.    [provider]    Allergies:  No Known Allergies  Social History:  reports that he has been smoking cigarettes. He has a 14.50 pack-year smoking history. He has never used smokeless tobacco. He reports that he does not drink alcohol and does not use drugs.  Family History: Family History  Problem Relation Age of Onset  . Heart disease Father   . Heart disease Sister   . Coronary artery disease Brother        CABG x 6  . Heart disease Brother   . Coronary artery disease Brother        first MI 75, died at 74 during cardiac surgery  . Coronary artery disease Sister        CABG x 5, RAS  . Heart disease Sister   . Colon cancer Neg Hx      Objective   Physical Exam: Blood pressure (!) 187/63, pulse 70, temperature 98 F (36.7 C), temperature source Oral, resp. rate 20, height 5\' 6"  (1.676 m), weight 59 kg, SpO2 96 %.  Physical Exam Vitals and nursing note reviewed.  Constitutional:      Appearance: Normal appearance.  HENT:     Head: Normocephalic and atraumatic.  Eyes:     Conjunctiva/sclera: Conjunctivae normal.  Cardiovascular:     Rate and Rhythm: Normal rate and regular rhythm.     Heart sounds: Murmur heard.   Systolic murmur is present with a grade of 2/6.     Comments: 2/6 systolic murmur in the aortic area Pulmonary:     Effort: Pulmonary effort is normal.     Comments: And expiratory RUL wheeze Abdominal:     General: Abdomen is flat.     Palpations:  Abdomen is soft.  Musculoskeletal:         General: No swelling.     Right lower leg: No edema.     Left lower leg: No edema.     Comments: Negative left lower extremity straight leg raise test  Skin:    Coloration: Skin is not jaundiced or pale.  Neurological:     Mental Status: He is alert.     Sensory: Sensory deficit present.     Motor: No weakness.     Comments: Decreased sensation of lateral left lower leg compared to right.  Intact sensation of the right lower extremity  Psychiatric:        Mood and Affect: Mood normal.        Behavior: Behavior normal.     LABS on Admission: I have personally reviewed all the labs and imaging below    Basic Metabolic Panel: Recent Labs  Lab 04/06/21 0905 04/09/21 1102  NA 141 141  K 3.3* 3.5  CL 101 107  CO2  --  27  GLUCOSE 95 116*  BUN 21 15  CREATININE 1.10 0.86  CALCIUM  --  8.5*   Liver Function Tests: Recent Labs  Lab 04/09/21 1102  AST 15  ALT 10  ALKPHOS 51  BILITOT 0.6  PROT 6.5  ALBUMIN 3.0*   No results for input(s): LIPASE, AMYLASE in the last 168 hours. No results for input(s): AMMONIA in the last 168 hours. CBC: Recent Labs  Lab 04/06/21 0905 04/09/21 1102  WBC  --  7.4  NEUTROABS  --  5.9  HGB 10.2* 10.7*  HCT 30.0* 34.3*  MCV  --  104.9*  PLT  --  201   Cardiac Enzymes: No results for input(s): CKTOTAL, CKMB, CKMBINDEX, TROPONINI in the last 168 hours. BNP: Invalid input(s): POCBNP CBG: No results for input(s): GLUCAP in the last 168 hours.  Radiological Exams on Admission:  DG Chest 2 View  Result Date: 04/09/2021 CLINICAL DATA:  Cough, shortness of breath EXAM: CHEST - 2 VIEW COMPARISON:  08/25/2017 FINDINGS: Prior CABG. Heart is normal size. Bilateral airspace disease noted, right greater than left. Small bilateral effusions. Scattered aortic atherosclerosis. No acute bony abnormality. IMPRESSION: Bilateral airspace disease and effusions, right greater than left. This could represent asymmetric edema or pneumonia.  Electronically Signed   By: Rolm Baptise M.D.   On: 04/09/2021 10:56   CT Angio Chest PE W/Cm &/Or Wo Cm  Result Date: 04/09/2021 CLINICAL DATA:  Shortness of breath and weakness.  Recent surgery. EXAM: CT ANGIOGRAPHY CHEST WITH CONTRAST TECHNIQUE: Multidetector CT imaging of the chest was performed using the standard protocol during bolus administration of intravenous contrast. Multiplanar CT image reconstructions and MIPs were obtained to evaluate the vascular anatomy. CONTRAST:  72mL OMNIPAQUE IOHEXOL 350 MG/ML SOLN COMPARISON:  None. FINDINGS: Cardiovascular: Image quality is degraded by respiratory motion. No pulmonary embolus. Atherosclerotic calcification of the aorta and coronary arteries. Pulmonic trunk and heart are enlarged. No pericardial effusion. Mediastinum/Nodes: No pathologically enlarged mediastinal, hilar or axillary lymph nodes. Esophagus is grossly unremarkable. Lungs/Pleura: Centrilobular and paraseptal emphysema. Image quality is degraded by respiratory motion. Large right, and small left, pleural effusions. Collapse/consolidation in the right lower lobe. Minimal compressive atelectasis in the left lower lobe. Basilar predominant septal thickening with patchy ground-glass in the posterior right upper lobe. Airway is unremarkable. Upper Abdomen: 3.5 cm low-attenuation lesion in the left hepatic lobe is likely a cyst. Visualized portions of the liver, gallbladder, adrenal glands and  right kidney are otherwise unremarkable. Exophytic low-attenuation lesion off the left kidney measures 2.1 cm, incompletely visualized. Visualized portions of the spleen, pancreas, stomach and bowel are grossly unremarkable. Musculoskeletal: Degenerative changes in the spine. No worrisome lytic or sclerotic lesions. Flowing anterior osteophytosis in the thoracic spine. Review of the MIP images confirms the above findings. IMPRESSION: 1. Negative for pulmonary embolus. 2. Congestive heart failure with right greater  than left pleural effusions. 3. Difficult to exclude superimposed pneumonia in the right upper and right lower lobes. 4. Aortic atherosclerosis (ICD10-I70.0). Coronary artery calcification. 5. Enlarged pulmonic trunk, indicative of pulmonary arterial hypertension. 6.  Emphysema (ICD10-J43.9). Electronically Signed   By: Lorin Picket M.D.   On: 04/09/2021 13:26      EKG: unchanged from previous tracings, normal sinus rhythm, nonspecific ST and T waves changes   A & P   Principal Problem:   Acute CHF (congestive heart failure) (HCC) Active Problems:   Essential hypertension   Coronary atherosclerosis   PAD (peripheral artery disease) (HCC)   Acute respiratory failure with hypoxia (HCC)   1. Acute hypoxic respiratory failure suspect secondary to new acute CHF, unspecified type a. BNP 622, CXR with CHF findings, troponins flat x2 and EKG at baseline b. Systolic murmur on exam, may have a component of AS c. Echo d. Continue IV Lasix e. Monitor daily weights and intake/output  2. Decreased sensation of the left lower extremity, unclear etiology a. Possibly related to his recurrent falls b. PT eval c. May need to consider a lumbar spine MRI  3. Recurrent falls of unclear etiology a. Suspect multifactorial etiology: Debility, possibly secondary to left lower extremity sensory changes, possibly episodic hypotension as he is on multiple antihypertensives b. PT eval c. Echo d. May need med rec  4. Symptomatic left carotid artery stenosis s/p recent arch/cerebral angiogram on 6/3 with Dr. [name] a. Continue aspirin, Plavix, statin home meds b. Vascular surgery consulted by the ED provider to ensure no complications leading to this hospitalization and recommends a head CT or MRI brain to confirm his weakness is not from a periprocedural stroke c. will order a CT without contrast  5. Questionable CAP a. Here with acute hypoxic respiratory failure from CHF but CT also noting possible  superimposed pneumonia in the right upper and right lower lobes b. He is afebrile without leukocytosis however given his new O2 requirement, productive cough and and expiratory wheeze on the right chest on exam will continue ceftriaxone and azithromycin for CAP coverage for now.  Consider discontinuing antibiotics pending clinical course  6. Hypertension a. On Imdur, losartan-HCTZ, Lopressor, nifedipine at home  7. PAD  hyperlipidemia  CAD s/p CABG a. Continue aspirin, plavix, statin    DVT prophylaxis: Lovenox   Code Status: DNR  Diet: Heart healthy Family Communication: Admission, patients condition and plan of care including tests being ordered have been discussed with the patient who indicates understanding and agrees with the plan and Code Status. Patient's niece was updated  Disposition Plan: The appropriate patient status for this patient is INPATIENT. Inpatient status is judged to be reasonable and necessary in order to provide the required intensity of service to ensure the patient's safety. The patient's presenting symptoms, physical exam findings, and initial radiographic and laboratory data in the context of their chronic comorbidities is felt to place them at high risk for further clinical deterioration. Furthermore, it is not anticipated that the patient will be medically stable for discharge from the hospital within 2 midnights  of admission. The following factors support the patient status of inpatient.   " The patient's presenting symptoms include recurrent falls, shortness of breath. " The worrisome physical exam findings include O2 requirement, left leg decree sensation. " The initial radiographic and laboratory data are worrisome because of CHF. " The chronic co-morbidities include PAD, recent cerebral angiogram.   * I certify that at the point of admission it is my clinical judgment that the patient will require inpatient hospital care spanning beyond 2 midnights from the  point of admission due to high intensity of service, high risk for further deterioration and high frequency of surveillance required.*   The medical decision making on this patient was of high complexity and the patient is at high risk for clinical deterioration, therefore this is a level 3  admission.  Consultants  . Vascular surgery  Procedures  . None  Time Spent on Admission: 71 minutes    Harold Hedge, DO [facility]  04/09/2021, 4:22 PM

## 2021-04-09 NOTE — ED Notes (Signed)
Placed external condom catheter on patient 

## 2021-04-09 NOTE — ED Notes (Signed)
Visitor at bedside.

## 2021-04-09 NOTE — ED Notes (Signed)
Pt received dinner tray.

## 2021-04-09 NOTE — Consult Note (Signed)
[facility] [place]  ASSESSMENT / PLAN: 80 y.o. male being admitted to the internal medicine service at [facility] for acute congestive heart failure. He has a known history of symptomatic left carotid artery stenosis and underwent arch / cerebral angiogram 04/06/21. There is no evidence of postprocedure stroke on clinical exam. Recommend checking CT Head or MRI brain to confirm his weakness is not from a periprocedural stroke.   He is planned to undergo complex, hybrid carotid revascularization in the near future with Dr. [name].   This is not urgent and should be delayed until his acute illness is over. I would only offer acute intervention if he suffered crescendo TIAs or stroke in evolution. Will communicate with the office and Dr. [name] to arrange for his elective procedure. Please call for questions.  CHIEF COMPLAINT: frequent falls  HISTORY OF PRESENT ILLNESS: Earl Castillo is a 80 y.o. male well-known to our service for severe, symptomatic left carotid artery stenosis.  He recently underwent an arch and cerebral angiogram on 04/06/2021 via left common femoral artery access with Dr. [name].  He has been followed by Dr. [name] for complex carotid lesion.  After angiogram Friday, the plan was to perform a hybrid revascularization of a complex common and internal carotid artery lesion with endarterectomy and transcarotid stenting.  Mr. Kisiel presents to the hospital today for evaluation of frequent falls.  This is been occurring since prior to the angiogram.  The patient reports he will become suddenly weak and feel like "his legs give out."He reports no focal symptoms.  He specifically denies unilateral weakness, visual changes, facial droop, difficulty speaking or swallowing.  He has had no difficulty in the access site.  In the [facility], ER, work-up was initiated and demonstrated a mildly elevated BNP and imaging evidence of pneumonia.  He was admitted to the  internal medicine service.  Vascular surgery consultation was requested for guidance with the complex carotid lesion.   Past Medical History:  Diagnosis Date  . Anemia   . Angiodysplasia of intestine - small bowel  09/21/2013  . Blood transfusion abn reaction or complication, no procedure mishap    "HgB dropped real low"  . Cancer [facility])    Skin cancers  . Carotid artery disease ([place])    status post carotid endarterectomy by [name]  . Chicken pox   . Colon polyps 08/2012   Hyperplastic and adenomatous sessile polyps  . Coronary artery disease    CABG'04, Low risk Myoview 2013  . GERD (gastroesophageal reflux disease)   . Hemorrhoid   . History of blood transfusion 08/2013   "HgB dropped real low; related to bleeding from my rectum"  . History of kidney stones    1 removed surgical, passed 2  . Hyperlipidemia   . Hypertension   . 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 Lower GI bleeding 08/2013  . Osteoarthritis    "hips" (05/23/2014)  . Peripheral arterial disease ([place])    status post left to right fem-fem crossover graft in by [name]  . Pneumonia    - "years ago"  . Sinus headache    "alot" (05/23/2014)  . Skin exam, screening for cancer    sees [name]   . Stroke ([place])   . Vitamin B12 deficiency 11/23/2013    Past Surgical History:  Procedure Laterality Date  . BAND HEMORRHOIDECTOMY    . BASAL CELL CARCINOMA  EXCISION  May 2009   per [name] , left nose   . basket extraction of ureteral stone  1970's  . Bypass rt leg Right 2000  . capsule endoscopy  10-05-13   per [name], 4 AVM's  . CARDIAC CATHETERIZATION  08/18/2003   Recommend CABG  . CARDIOVASCULAR STRESS TEST - LEXISCAN  08/25/2012   No significant wall motion abnormalities  . CAROTID ANGIOGRAM N/A 03/17/2014   Procedure: CAROTID ANGIOGRAM;  Surgeon: [name], MD;  Location: [facility] CATH LAB;  Service:  Cardiovascular;  Laterality: N/A;  right carotid  . CAROTID ANGIOGRAPHY N/A 04/06/2021   Procedure: CAROTID ANGIOGRAPHY;  Surgeon: Elam Dutch, MD;  Location: [place] CV LAB;  Service: Cardiovascular;  Laterality: N/A;  . CAROTID ENDARTERECTOMY Right   . CAROTID ENDARTERECTOMY Right 03/26/2005   Dr Georgina Quint  . CATARACT EXTRACTION W/ INTRAOCULAR LENS  IMPLANT, BILATERAL Bilateral 2014   per [name]  . [place]   "bulging disc"  . COLONOSCOPY  08-12-12   per [name], tubular adenomas, repeat in 5 yrs   . CORONARY ARTERY BYPASS GRAFT  08/30/2003   x3. LIMA-distal LAD, RIMA-circumflex, SVG-distal RCA  . CYSTOSCOPY W/ STONE MANIPULATION    . ESOPHAGOGASTRODUODENOSCOPY N/A 08/17/2013   Procedure: ESOPHAGOGASTRODUODENOSCOPY (EGD);  Surgeon: [name], MD;  Location: [facility] ENDOSCOPY;  Service: Endoscopy;  Laterality: N/A;  . FEMORAL ARTERY STENT Left 05/23/2014  . FEMORAL-FEMORAL BYPASS GRAFT  2003  . FEMORAL-POPLITEAL BYPASS GRAFT Left 08/09/2016   Procedure: BYPASS GRAFT LEFT FEMORAL-POPLITEAL ARTERY USING GORE 6MM X 80CM PROPATEN GRAFT;  Surgeon: Serafina Mitchell, MD;  Location: [place];  Service: Vascular;  Laterality: Left;  . LOWER EXTREMITY ANGIOGRAM N/A 03/17/2014   Procedure: LOWER EXTREMITY ANGIOGRAM;  Surgeon: [name], MD;  Location: [facility] CATH LAB;  Service: Cardiovascular;  Laterality: N/A;  . PERIPHERAL VASCULAR CATHETERIZATION N/A 06/06/2016   Procedure: Lower Extremity Angiography;  Surgeon: [name], MD;  Location: [place] CV LAB;  Service: Cardiovascular;  Laterality: N/A;  . PERIPHERAL VASCULAR CATHETERIZATION N/A 06/06/2016   Procedure: Abdominal Aortogram;  Surgeon: [name], MD;  Location: [place] CV LAB;  Service: Cardiovascular;  Laterality: N/A;  . RENAL DUPLEX Bilateral 02/02/2013   Bilateral renal arteries demonstrate narrowingconsistent with a 60-99% diameter reduction.  . TRANSTHORACIC ECHOCARDIOGRAM  08/02/2013    EF 55-60%, normal echocardiogram    Family History  Problem Relation Age of Onset  . Heart disease Father   . Heart disease Sister   . Coronary artery disease Brother        CABG x 6  . Heart disease Brother   . Coronary artery disease Brother        first MI 58, died at 41 during cardiac surgery  . Coronary artery disease Sister        CABG x 5, RAS  . Heart disease Sister   . Colon cancer Neg Hx     Social History   Socioeconomic History  . Marital status: Married    Spouse name: Not on file  . Number of children: 2  . Years of education: Not on file  . Highest education level: Not on file  Occupational History  . Occupation:  Semi Retired Geophysicist/field seismologist  Tobacco Use  . Smoking status: Current Every Day Smoker    Packs/day: 0.25    Years: 58.00    Pack years: 14.50    Types: Cigarettes  .  Smokeless tobacco: Never Used  . Tobacco comment: 3-4 cigarettes/day  Vaping Use  . Vaping Use: Never used  Substance and Sexual Activity  . Alcohol use: No    Alcohol/week: 0.0 standard drinks  . Drug use: No  . Sexual activity: Not Currently  Other Topics Concern  . Not on file  Social History Narrative   Daily caffeine    Social Determinants of Health   Financial Resource Strain: Low Risk   . Difficulty of Paying Living Expenses: Not hard at all  Food Insecurity: No Food Insecurity  . Worried About Charity fundraiser in the Last Year: Never true  . Ran Out of Food in the Last Year: Never true  Transportation Needs: No Transportation Needs  . Lack of Transportation (Medical): No  . Lack of Transportation (Non-Medical): No  Physical Activity: Insufficiently Active  . Days of Exercise per Week: 3 days  . Minutes of Exercise per Session: 20 min  Stress: No Stress Concern Present  . Feeling of Stress : Not at all  Social Connections: Moderately Isolated  . Frequency of Communication with Friends and Family: More than three times a week  . Frequency of Social Gatherings  with Friends and Family: More than three times a week  . Attends Religious Services: 1 to 4 times per year  . Active Member of Clubs or Organizations: No  . Attends Archivist Meetings: Never  . Marital Status: Widowed  Intimate Partner Violence: Not At Risk  . Fear of Current or Ex-Partner: No  . Emotionally Abused: No  . Physically Abused: No  . Sexually Abused: No    No Known Allergies  Current Facility-Administered Medications  Medication Dose Route Frequency Provider Last Rate Last Admin  . 0.9 %  sodium chloride infusion   Intravenous Continuous Serafina Mitchell, MD      . acetaminophen (TYLENOL) tablet 650 mg  650 mg Oral Q6H PRN Harold Hedge, MD       Or  . acetaminophen (TYLENOL) suppository 650 mg  650 mg Rectal Q6H PRN Harold Hedge, MD      . ceFAZolin (ANCEF) IVPB 2g/100 mL premix  2 g Intravenous 30 min Pre-Op Serafina Mitchell, MD      . Chlorhexidine Gluconate Cloth 2 % PADS 6 each  6 each Topical Once Serafina Mitchell, MD       And  . Chlorhexidine Gluconate Cloth 2 % PADS 6 each  6 each Topical Once Serafina Mitchell, MD      . furosemide (LASIX) injection 40 mg  40 mg Intravenous BID Harold Hedge, MD      . heparin injection 5,000 Units  5,000 Units Subcutaneous Q8H Harold Hedge, MD      . hydrALAZINE (APRESOLINE) injection 10 mg  10 mg Intravenous Q8H PRN Harold Hedge, MD      . sodium chloride flush (NS) 0.9 % injection 3 mL  3 mL Intravenous Q12H Harold Hedge, MD       Current Outpatient Medications  Medication Sig Dispense Refill  . acetaminophen (TYLENOL) 500 MG tablet Take 1,000 mg by mouth daily as needed for moderate pain (for leg pain).    Marland Kitchen aspirin EC 81 MG tablet Take 81 mg by mouth every evening.     . clopidogrel (PLAVIX) 75 MG tablet TAKE 1 TABLET BY MOUTH EVERY DAY (Patient taking differently: Take 75 mg by mouth in the morning.) 90 tablet 3  .  docusate sodium (COLACE) 100 MG capsule Take 100 mg by mouth daily as needed (for  constipation).     . ferrous gluconate (FERGON) 324 MG tablet TAKE 1 TABLET BY MOUTH EVERY DAY WITH BREAKFAST. (Patient taking differently: Take 324 mg by mouth daily with breakfast.) 90 tablet 0  . hydrochlorothiazide (HYDRODIURIL) 25 MG tablet Take 25 mg by mouth daily.    . isosorbide mononitrate (IMDUR) 30 MG 24 hr tablet TAKE 1 TABLET (30 MG TOTAL) BY MOUTH DAILY. NEED OV. (Patient taking differently: Take 30 mg by mouth in the morning.) 90 tablet 3  . losartan-hydrochlorothiazide (HYZAAR) 100-25 MG tablet Take 1 tablet by mouth every evening.    . metoprolol tartrate (LOPRESSOR) 50 MG tablet TAKE 1 TABLET BY MOUTH EVERY DAY (Patient taking differently: Take 50 mg by mouth in the morning.) 90 tablet 1  . NIFEdipine (ADALAT CC) 90 MG 24 hr tablet TAKE 1 TABLET BY MOUTH EVERY DAY (Patient taking differently: Take 90 mg by mouth in the morning.) 90 tablet 3  . simvastatin (ZOCOR) 20 MG tablet TAKE 1 TABLET (20 MG TOTAL) BY MOUTH DAILY AT 6 PM. NEED OV. (Patient taking differently: Take 20 mg by mouth at bedtime.) 90 tablet 1  . vitamin B-12 (CYANOCOBALAMIN) 500 MCG tablet Take 500 mcg by mouth in the morning.      REVIEW OF SYSTEMS:  [X]  denotes positive finding, [ ]  denotes negative finding Cardiac  Comments:  Chest pain or chest pressure:    Shortness of breath upon exertion:    Short of breath when lying flat:    Irregular heart rhythm:        Vascular    Pain in calf, thigh, or hip brought on by ambulation:    Pain in feet at night that wakes you up from your sleep:     Blood clot in your veins:    Leg swelling:         Pulmonary    Oxygen at home:    Productive cough:     Wheezing:         Neurologic    Sudden weakness in arms or legs:     Sudden numbness in arms or legs:     Sudden onset of difficulty speaking or slurred speech:    Temporary loss of vision in one eye:     Problems with dizziness:         Gastrointestinal    Blood in stool:     Vomited blood:          Genitourinary    Burning when urinating:     Blood in urine:        Psychiatric    Major depression:         Hematologic    Bleeding problems:    Problems with blood clotting too easily:        Skin    Rashes or ulcers:        Constitutional    Fever or chills:      PHYSICAL EXAM  Vitals:   04/09/21 1235 04/09/21 1334 04/09/21 1345 04/09/21 1430  BP:   (!) 176/150 (!) 187/63  Pulse: (!) 59 71 68 70  Resp: (!) 26 (!) 25 18 20   Temp:      TempSrc:      SpO2: 96% 98% 98% 96%  Weight:      Height:        Constitutional: Elderly appearing.  No distress. Appears marginally  nourished.  Neurologic: CN intact.  No focal findings.  5 out of 5 strength in all extremities.  No sensory loss. Psychiatric: Mood and affect symmetric and appropriate. Eyes: No icterus. No conjunctival pallor. Ears, nose, throat: mucous membranes moist. Midline trachea.  Cardiac: Regular rate and rhythm.  Respiratory: unlabored. Abdominal: soft, non-tender, non-distended.  Peripheral vascular:  2+ radial pulses bilaterally Extremity: No edema. No cyanosis. No pallor.  Skin: No gangrene. No ulceration.  Lymphatic: No Stemmer's sign. No palpable lymphadenopathy.  PERTINENT LABORATORY AND RADIOLOGIC DATA  Most recent CBC CBC Latest Ref Rng & Units 04/09/2021 04/06/2021 02/28/2021  WBC 4.0 - 10.5 K/uL 7.4 - 12.0(H)  Hemoglobin 13.0 - 17.0 g/dL 10.7(L) 10.2(L) 13.2  Hematocrit 39.0 - 52.0 % 34.3(L) 30.0(L) 39.9  Platelets 150 - 400 K/uL 201 - 167     Most recent CMP CMP Latest Ref Rng & Units 04/09/2021 04/06/2021 02/28/2021  Glucose 70 - 99 mg/dL 116(H) 95 131(H)  BUN 8 - 23 mg/dL 15 21 39(H)  Creatinine 0.61 - 1.24 mg/dL 0.86 1.10 1.96(H)  Sodium 135 - 145 mmol/L 141 141 142  Potassium 3.5 - 5.1 mmol/L 3.5 3.3(L) 4.2  Chloride 98 - 111 mmol/L 107 101 105  CO2 22 - 32 mmol/L 27 - 27  Calcium 8.9 - 10.3 mg/dL 8.5(L) - 8.9  Total Protein 6.5 - 8.1 g/dL 6.5 - -  Total Bilirubin 0.3 - 1.2 mg/dL 0.6 -  -  Alkaline Phos 38 - 126 U/L 51 - -  AST 15 - 41 U/L 15 - -  ALT 0 - 44 U/L 10 - -    Renal function Estimated Creatinine Clearance: 58.1 mL/min (by C-G formula based on SCr of 0.86 mg/dL).  No results found for: HGBA1C  LDL Chol Calc (NIH)  Date Value Ref Range Status  01/01/2021 49 0 - 99 mg/dL Final    ER work-up personally reviewed Mildly elevated troponin which has not changed BNP 622 No evidence of urinary tract infection Chest x-ray and CT angiogram show pneumonia No evidence of pulmonary embolism   Yevonne Aline. Stanford Breed, MD [facility] Phone Number: [phone] 04/09/2021 3:42 PM

## 2021-04-09 NOTE — ED Triage Notes (Signed)
Pt comes from home, 2 falls in the past 24hrs, family assisted pt up after 2nd fall. Pt had vascular surgery on left leg on Friday, c/o weakness post surgery- pt able to stand and use walker w EMS. Pt was SOB when walking, 02 at 89%- EMS put pt on 3L [place], O2 >98%

## 2021-04-09 NOTE — ED Notes (Signed)
Pt states he did not take any of his meds this am, including bp meds, and that he is on plavix

## 2021-04-09 NOTE — ED Provider Notes (Signed)
[facility] DEPT Provider Note   CSN: [id number] Arrival date & time: 04/09/21  0160     History Chief Complaint  Patient presents with  . Fall    Earl Castillo is a 79 y.o. male with PMHx HTN, HLD, GERD, Anemia requiring blood transfusion, CAD who presents to the ED via EMS with complaint of falls over the past 2 days. Recent vascular surgery procedure via left common femoral puncture on 06/03. Pt reports that he is walking with his walker however feels like his legs are too weak and give out from underneath him causing him to fall. He last fell last night around 3 AM however denies head injury or LOC. He landed onto his buttocks however denies any pain. Pt also complains of a mild productive cough and mild SOB. Per EMS pt was found to be hypoxic at 89% and placed on 3L. He is not typically on O2 at home. Pt denies fevers or chills. No other complaints at this time.   The history is provided by the patient, the EMS personnel and medical records.       Past Medical History:  Diagnosis Date  . Anemia   . Angiodysplasia of intestine - small bowel  09/21/2013  . Blood transfusion abn reaction or complication, no procedure mishap    "HgB dropped real low"  . Cancer [facility])    Skin cancers  . Carotid artery disease ([place])    status post carotid endarterectomy by [name]  . Chicken pox   . Colon polyps 08/2012   Hyperplastic and adenomatous sessile polyps  . Coronary artery disease    CABG'04, Low risk Myoview 2013  . GERD (gastroesophageal reflux disease)   . Hemorrhoid   . History of blood transfusion 08/2013   "HgB dropped real low; related to bleeding from my rectum"  . History of kidney stones    1 removed surgical, passed 2  . Hyperlipidemia   . Hypertension   . 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 Lower GI bleeding 08/2013  . Osteoarthritis    "hips" (05/23/2014)  .  Peripheral arterial disease ([place])    status post left to right fem-fem crossover graft in by [name]  . Pneumonia    - "years ago"  . Sinus headache    "alot" (05/23/2014)  . Skin exam, screening for cancer    sees [name]   . Stroke ([place])   . Vitamin B12 deficiency 11/23/2013    Patient Active Problem List   Diagnosis Date Noted  . NAION (non-arteritic anterior ischemic optic neuropathy), left 12/28/2020  . Choroidal nevus, right 11/30/2020  . PAD (peripheral artery disease) ([place]) 08/09/2016  . GI bleed 06/22/2014  . Current smoker 05/24/2014  . Claudication ([place]) 05/23/2014  . Vitamin B12 deficiency 11/23/2013  . Angiodysplasia of intestine - small bowel  09/21/2013  . PVD- Lt SFA PTA/stent 05/23/14 09/13/2013  . Carotid artery disease ([place]) 09/13/2013  . 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 Personal history of colonic polyps-adenoma 08/17/2012  . NEPHROLITHIASIS, HX OF 09/10/2010  . URTICARIA 08/22/2010  . Coronary atherosclerosis 01/13/2009  . Osteoarthritis 01/13/2009  . CHICKENPOX, HX OF 01/13/2008  . Hyperlipidemia 07/29/2007  . Essential hypertension 07/29/2007    Past Surgical History:  Procedure Laterality Date  . BAND HEMORRHOIDECTOMY    . BASAL CELL CARCINOMA EXCISION  May 2009  per [name] , left nose   . basket extraction of ureteral stone  1970's  . Bypass rt leg Right 2000  . capsule endoscopy  10-05-13   per [name], 4 AVM's  . CARDIAC CATHETERIZATION  08/18/2003   Recommend CABG  . CARDIOVASCULAR STRESS TEST - LEXISCAN  08/25/2012   No significant wall motion abnormalities  . CAROTID ANGIOGRAM N/A 03/17/2014   Procedure: CAROTID ANGIOGRAM;  Surgeon: [name], MD;  Location[facility] LAB;  Service: Cardiovascular;  Laterality: N/A;  right carotid  . CAROTID ANGIOGRAPHY N/A 04/06/2021   Procedure: CAROTID ANGIOGRAPHY;  Surgeon: Elam Dutch, MD;  Location: [facility] CV LAB;  Service: Cardiovascular;  Laterality: N/A;  . CAROTID ENDARTERECTOMY Right   . CAROTID ENDARTERECTOMY Right 03/26/2005   Dr Georgina Quint  . CATARACT EXTRACTION W/ INTRAOCULAR LENS  IMPLANT, BILATERAL Bilateral 2014   per [name]  . [place]   "bulging disc"  . COLONOSCOPY  08-12-12   per [name], tubular adenomas, repeat in 5 yrs   . CORONARY ARTERY BYPASS GRAFT  08/30/2003   x3. LIMA-distal LAD, RIMA-circumflex, SVG-distal RCA  . CYSTOSCOPY W/ STONE MANIPULATION    . ESOPHAGOGASTRODUODENOSCOPY N/A 08/17/2013   Procedure: ESOPHAGOGASTRODUODENOSCOPY (EGD);  Surgeon: [name], MD;  Location: [facility] ENDOSCOPY;  Service: Endoscopy;  Laterality: N/A;  . FEMORAL ARTERY STENT Left 05/23/2014  . FEMORAL-FEMORAL BYPASS GRAFT  2003  . FEMORAL-POPLITEAL BYPASS GRAFT Left 08/09/2016   Procedure: BYPASS GRAFT LEFT FEMORAL-POPLITEAL ARTERY USING GORE 6MM X 80CM PROPATEN GRAFT;  Surgeon: Serafina Mitchell, MD;  Location: [facility];  Service: Vascular;  Laterality: Left;  . LOWER EXTREMITY ANGIOGRAM N/A 03/17/2014   Procedure: LOWER EXTREMITY ANGIOGRAM;  Surgeon: [name], MD;  Location[facility] CATH LAB;  Service: Cardiovascular;  Laterality: N/A;  . PERIPHERAL VASCULAR CATHETERIZATION N/A 06/06/2016   Procedure: Lower Extremity Angiography;  Surgeon: [name], MD;  Location: [place] CV LAB;  Service: Cardiovascular;  Laterality: N/A;  . PERIPHERAL VASCULAR CATHETERIZATION N/A 06/06/2016   Procedure: Abdominal Aortogram;  Surgeon: [name], MD;  Location: [facility] CV LAB;  Service: Cardiovascular;  Laterality: N/A;  . RENAL DUPLEX Bilateral 02/02/2013   Bilateral renal arteries demonstrate narrowingconsistent with a 60-99% diameter reduction.  . TRANSTHORACIC ECHOCARDIOGRAM  08/02/2013   EF 55-60%, normal echocardiogram       Family History  Problem Relation Age of Onset  . Heart disease Father   . Heart disease Sister   . Coronary artery  disease Brother        CABG x 6  . Heart disease Brother   . Coronary artery disease Brother        first MI 56, died at 80 during cardiac surgery  . Coronary artery disease Sister        CABG x 5, RAS  . Heart disease Sister   . Colon cancer Neg Hx     Social History   Tobacco Use  . Smoking status: Current Every Day Smoker    Packs/day: 0.25    Years: 58.00    Pack years: 14.50    Types: Cigarettes  . Smokeless tobacco: Never Used  . Tobacco comment: 3-4 cigarettes/day  Vaping Use  . Vaping Use: Never used  Substance Use Topics  . Alcohol use: No    Alcohol/week: 0.0 standard drinks  . Drug use: No    Home Medications Prior to Admission medications  Medication Sig Start Date End Date Taking? Authorizing Provider  acetaminophen (TYLENOL) 500 MG tablet Take 1,000 mg by mouth daily as needed (for leg pain).     [provider]  aspirin EC 81 MG tablet Take 81 mg by mouth every evening.     [provider]  clopidogrel (PLAVIX) 75 MG tablet TAKE 1 TABLET BY MOUTH EVERY DAY Patient taking differently: Take 75 mg by mouth in the morning. 03/05/21   [name], MD  docusate sodium (COLACE) 100 MG capsule Take 100 mg by mouth daily as needed (for constipation).  08/19/13   Debbe Odea, MD  ferrous gluconate (FERGON) 324 MG tablet TAKE 1 TABLET BY MOUTH EVERY DAY WITH BREAKFAST. Patient taking differently: Take 324 mg by mouth daily with breakfast. 12/15/20   [name], MD  isosorbide mononitrate (IMDUR) 30 MG 24 hr tablet TAKE 1 TABLET (30 MG TOTAL) BY MOUTH DAILY. NEED OV. Patient taking differently: Take 30 mg by mouth in the morning. 01/11/21   [name], MD  losartan-hydrochlorothiazide (HYZAAR) 100-25 MG tablet Take 1 tablet by mouth every evening.    [provider]  metoprolol tartrate (LOPRESSOR) 50 MG tablet TAKE 1 TABLET BY MOUTH EVERY DAY Patient taking differently: Take 50 mg by mouth in the morning. 03/22/21   [name], MD  NIFEdipine (ADALAT CC) 90 MG 24 hr tablet TAKE 1 TABLET BY MOUTH EVERY DAY Patient taking differently: Take 90 mg by mouth in the morning. 12/14/20   [name], MD  simvastatin (ZOCOR) 20 MG tablet TAKE 1 TABLET (20 MG TOTAL) BY MOUTH DAILY AT 6 PM. NEED OV. Patient taking differently: Take 20 mg by mouth at bedtime. NEED OV. 03/13/21   [name], MD  vitamin B-12 (CYANOCOBALAMIN) 500 MCG tablet Take 500 mcg by mouth in the morning.    [provider]    Allergies    Patient has no known allergies.  Review of Systems   Review of Systems  Constitutional: Negative for chills and fever.  Respiratory: Positive for cough and shortness of breath.   Cardiovascular: Negative for chest pain.  Gastrointestinal: Negative for abdominal pain, diarrhea, nausea and vomiting.  Musculoskeletal: Negative for arthralgias, back pain and neck pain.  Neurological: Positive for weakness (generalized BLE). Negative for syncope and headaches.  All other systems reviewed and are negative.   Physical Exam Updated Vital Signs BP (!) 184/59   Pulse 61   Temp 98 F (36.7 C) (Oral)   Resp (!) 26   Ht 5\' 6"  (1.676 m)   Wt 59 kg   SpO2 91%   BMI 20.99 kg/m   Physical Exam Vitals and nursing note reviewed.  Constitutional:      Appearance: He is not ill-appearing or diaphoretic.  HENT:     Head: Normocephalic and atraumatic.  Eyes:     Extraocular Movements: Extraocular movements intact.     Conjunctiva/sclera: Conjunctivae normal.     Pupils: Pupils are equal, round, and reactive to light.  Cardiovascular:     Rate and Rhythm: Normal rate and regular rhythm.     Pulses: Normal pulses.  Pulmonary:     Effort: Pulmonary effort is normal.     Breath sounds: Rales present. No wheezing or rhonchi.     Comments: 88% on RA, 93% on 2L Abdominal:     Palpations: Abdomen is soft.     Tenderness: There is no abdominal tenderness. There is no guarding  or rebound.   Musculoskeletal:     Cervical back: Neck supple.  Skin:    General: Skin is warm and dry.     Comments: Left femoral insertion site; no signs of infection. 1+ DP pulses bilaterally  Neurological:     General: No focal deficit present.     Mental Status: He is alert and oriented to person, place, and time.     Motor: No weakness.     Comments: Equal strength to BLEs     ED Results / Procedures / Treatments   Labs (all labs ordered are listed, but only abnormal results are displayed) Labs Reviewed  URINALYSIS, ROUTINE W REFLEX MICROSCOPIC - Abnormal; Notable for the following components:      Result Value   Hgb urine dipstick SMALL (*)    Protein, ur 100 (*)    All other components within normal limits  COMPREHENSIVE METABOLIC PANEL - Abnormal; Notable for the following components:   Glucose, Bld 116 (*)    Calcium 8.5 (*)    Albumin 3.0 (*)    All other components within normal limits  CBC WITH DIFFERENTIAL/PLATELET - Abnormal; Notable for the following components:   RBC 3.27 (*)    Hemoglobin 10.7 (*)    HCT 34.3 (*)    MCV 104.9 (*)    All other components within normal limits  BRAIN NATRIURETIC PEPTIDE - Abnormal; Notable for the following components:   B Natriuretic Peptide 622.8 (*)    All other components within normal limits  TROPONIN I (HIGH SENSITIVITY) - Abnormal; Notable for the following components:   Troponin I (High Sensitivity) 19 (*)    All other components within normal limits  TROPONIN I (HIGH SENSITIVITY) - Abnormal; Notable for the following components:   Troponin I (High Sensitivity) 19 (*)    All other components within normal limits  RESP PANEL BY RT-PCR (FLU A&B, COVID) [id number]    EKG EKG Interpretation  Date/Time:  Monday April 09 2021 10:01:08 EDT Ventricular Rate:  64 PR Interval:  148 QRS Duration: 98 QT Interval:  451 QTC Calculation: 466 R Axis:   23 Text Interpretation: Sinus rhythm Abnormal R-wave progression, early transition LVH  with secondary repolarization abnormality Baseline wander Otherwise no significant change Confirmed by Fredia Sorrow [phone]) on 04/09/2021 11:21:18 AM   Radiology DG Chest 2 View  Result Date: 04/09/2021 CLINICAL DATA:  Cough, shortness of breath EXAM: CHEST - 2 VIEW COMPARISON:  08/25/2017 FINDINGS: Prior CABG. Heart is normal size. Bilateral airspace disease noted, right greater than left. Small bilateral effusions. Scattered aortic atherosclerosis. No acute bony abnormality. IMPRESSION: Bilateral airspace disease and effusions, right greater than left. This could represent asymmetric edema or pneumonia. Electronically Signed   By: Rolm Baptise M.D.   On: 04/09/2021 10:56   CT Angio Chest PE W/Cm &/Or Wo Cm  Result Date: 04/09/2021 CLINICAL DATA:  Shortness of breath and weakness.  Recent surgery. EXAM: CT ANGIOGRAPHY CHEST WITH CONTRAST TECHNIQUE: Multidetector CT imaging of the chest was performed using the standard protocol during bolus administration of intravenous contrast. Multiplanar CT image reconstructions and MIPs were obtained to evaluate the vascular anatomy. CONTRAST:  27mL OMNIPAQUE IOHEXOL 350 MG/ML SOLN COMPARISON:  None. FINDINGS: Cardiovascular: Image quality is degraded by respiratory motion. No pulmonary embolus. Atherosclerotic calcification of the aorta and coronary arteries. Pulmonic trunk and heart are enlarged. No pericardial effusion. Mediastinum/Nodes: No pathologically enlarged mediastinal, hilar or axillary lymph nodes. Esophagus is grossly unremarkable. Lungs/Pleura: Centrilobular and paraseptal emphysema.  Image quality is degraded by respiratory motion. Large right, and small left, pleural effusions. Collapse/consolidation in the right lower lobe. Minimal compressive atelectasis in the left lower lobe. Basilar predominant septal thickening with patchy ground-glass in the posterior right upper lobe. Airway is unremarkable. Upper Abdomen: 3.5 cm low-attenuation lesion in the  left hepatic lobe is likely a cyst. Visualized portions of the liver, gallbladder, adrenal glands and right kidney are otherwise unremarkable. Exophytic low-attenuation lesion off the left kidney measures 2.1 cm, incompletely visualized. Visualized portions of the spleen, pancreas, stomach and bowel are grossly unremarkable. Musculoskeletal: Degenerative changes in the spine. No worrisome lytic or sclerotic lesions. Flowing anterior osteophytosis in the thoracic spine. Review of the MIP images confirms the above findings. IMPRESSION: 1. Negative for pulmonary embolus. 2. Congestive heart failure with right greater than left pleural effusions. 3. Difficult to exclude superimposed pneumonia in the right upper and right lower lobes. 4. Aortic atherosclerosis (ICD10-I70.0). Coronary artery calcification. 5. Enlarged pulmonic trunk, indicative of pulmonary arterial hypertension. 6.  Emphysema (ICD10-J43.9). Electronically Signed   By: Lorin Picket M.D.   On: 04/09/2021 13:26    Procedures Procedures   Medications Ordered in ED Medications  azithromycin (ZITHROMAX) 500 mg in sodium chloride 0.9 % 250 mL IVPB (has no administration in time range)  furosemide (LASIX) injection 40 mg (has no administration in time range)  iohexol (OMNIPAQUE) 350 MG/ML injection 100 mL (75 mLs Intravenous Contrast Given 04/09/21 1258)  cefTRIAXone (ROCEPHIN) 1 g in sodium chloride 0.9 % 100 mL IVPB (1 g Intravenous New Bag/Given 04/09/21 1345)    ED Course  I have reviewed the triage vital signs and the nursing notes.  Pertinent labs & imaging results that were available during my care of the patient were reviewed by me and considered in my medical decision making (see chart for details).  Clinical Course as of 04/09/21 1422  Mon Apr 09, 2021  1405 B Natriuretic Peptide(!): 622.8 [MV]    Clinical Course User Index [MV] Eustaquio Maize, PA-C   MDM Rules/Calculators/A&P                          80 year old male who  presents to the ED today with complaint of generalized weakness in his bilateral lower extremities for the past 2 days as well as a productive cough and shortness of breath.  Recent vascular procedure through left common femoral artery on 06/03 by Dr. [name].  On arrival to the ED today patient is found to be afebrile, nontachycardic.  He is mildly tachypneic with respirations at 22.  Initially on 3 L with EMS as he was hypoxic at 89% on room air, not typically on O2.  As I enter the room patient is not on oxygen and satting 88 to 89%.  Placed on 2 L with improvement to 93 to 95%.  Blood pressure is elevated at 204/57 initially, repeat 178/54.  On my exam he has rales at the bases.  Concern for possible pneumonia at this time.  Patient denies any chest pain or fevers at home.  Given recent procedure with hypoxia there is concern for possible PE.  May consider CTA, expect D-dimer to be elevated and therefore not obtained. Discussed case with attending physician Dr. Rogene Houston who agrees with plan  EKG without acute ischemic change CBC without leukocytosis. Hgb stable at 10.7 per last hgb read during procedure CMP with glucose 116. No other electrolyte abnormalities Troponin 19. Will plan to repeat  CXR: IMPRESSION:  Bilateral airspace disease and effusions, right greater than left.  This could represent asymmetric edema or pneumonia.   U/A without signs of infection  Given unequivocal CXR findings will plan for CTA at this time to rule out PE given hypoxia and SOB.   CTA: IMPRESSION:  1. Negative for pulmonary embolus.  2. Congestive heart failure with right greater than left pleural  effusions.  3. Difficult to exclude superimposed pneumonia in the right upper  and right lower lobes.  4. Aortic atherosclerosis (ICD10-I70.0). Coronary artery  calcification.  5. Enlarged pulmonic trunk, indicative of pulmonary arterial  hypertension.  6. Emphysema (ICD10-J43.9).   BNP added. No hx of CHF  however elevated at 622.8. Likely this with possible underlying PNA contributing to hypoxia and weakness. IV lasix and abx provided at this time. Will call for admission.   Repeat troponin unchanged at 19  Discussed case with [facility] Dr. Neysa Bonito who agrees to accept patient for admission; requesting consult to vascular surgery to ensure new findings are not related to pt's recent procedure.   Discussed case with vascular surgeon Dr. [place] Breed who will be happy to come consult on patient however does not believe new CHF is likely related to recent carotid angiography. Appreciate his involvement.   This note was prepared using Dragon voice recognition software and may include unintentional dictation errors due to the inherent limitations of voice recognition software.  Final Clinical Impression(s) / ED Diagnoses Final diagnoses:  Congestive heart failure, unspecified HF chronicity, unspecified heart failure type ([place])  Community acquired pneumonia, unspecified laterality  Generalized weakness    Rx / DC Orders ED Discharge Orders    None       Eustaquio Maize, PA-C 04/09/21 1449    Fredia Sorrow, MD 04/09/21 1556

## 2021-04-10 ENCOUNTER — Encounter (HOSPITAL_COMMUNITY): Payer: Self-pay | Admitting: Internal Medicine

## 2021-04-10 ENCOUNTER — Other Ambulatory Visit: Payer: Self-pay

## 2021-04-10 ENCOUNTER — Inpatient Hospital Stay (HOSPITAL_COMMUNITY): Payer: Medicare Other

## 2021-04-10 DIAGNOSIS — I509 Heart failure, unspecified: Secondary | ICD-10-CM

## 2021-04-10 LAB — URINALYSIS, ROUTINE W REFLEX MICROSCOPIC
Bilirubin Urine: NEGATIVE
Glucose, UA: NEGATIVE mg/dL
Ketones, ur: NEGATIVE mg/dL
Leukocytes,Ua: NEGATIVE
Nitrite: NEGATIVE
Protein, ur: 100 mg/dL — AB
Specific Gravity, Urine: 1.011 (ref 1.005–1.030)
pH: 5 (ref 5.0–8.0)

## 2021-04-10 LAB — COMPREHENSIVE METABOLIC PANEL
ALT: 9 U/L (ref 0–44)
AST: 28 U/L (ref 15–41)
Albumin: 2.9 g/dL — ABNORMAL LOW (ref 3.5–5.0)
Alkaline Phosphatase: 47 U/L (ref 38–126)
Anion gap: 9 (ref 5–15)
BUN: 19 mg/dL (ref 8–23)
CO2: 31 mmol/L (ref 22–32)
Calcium: 8.5 mg/dL — ABNORMAL LOW (ref 8.9–10.3)
Chloride: 97 mmol/L — ABNORMAL LOW (ref 98–111)
Creatinine, Ser: 1.14 mg/dL (ref 0.61–1.24)
GFR, Estimated: >60 mL/min (ref >60)
Glucose, Bld: 101 mg/dL — ABNORMAL HIGH (ref 70–99)
Potassium: 3.9 mmol/L (ref 3.5–5.1)
Sodium: 137 mmol/L (ref 135–145)
Total Bilirubin: 1.6 mg/dL — ABNORMAL HIGH (ref 0.3–1.2)
Total Protein: 6.8 g/dL (ref 6.5–8.1)

## 2021-04-10 LAB — BASIC METABOLIC PANEL
Anion gap: 6 (ref 5–15)
BUN: 23 mg/dL (ref 8–23)
CO2: 34 mmol/L — ABNORMAL HIGH (ref 22–32)
Calcium: 8.4 mg/dL — ABNORMAL LOW (ref 8.9–10.3)
Chloride: 98 mmol/L (ref 98–111)
Creatinine, Ser: 1.56 mg/dL — ABNORMAL HIGH (ref 0.61–1.24)
GFR, Estimated: 45 mL/min — ABNORMAL LOW (ref >60)
Glucose, Bld: 145 mg/dL — ABNORMAL HIGH (ref 70–99)
Potassium: 3.4 mmol/L — ABNORMAL LOW (ref 3.5–5.1)
Sodium: 138 mmol/L (ref 135–145)

## 2021-04-10 LAB — CBC
HCT: 35.1 % — ABNORMAL LOW (ref 39.0–52.0)
HCT: 35.1 % — ABNORMAL LOW (ref 39.0–52.0)
Hemoglobin: 11.5 g/dL — ABNORMAL LOW (ref 13.0–17.0)
Hemoglobin: 11.4 g/dL — ABNORMAL LOW (ref 13.0–17.0)
MCH: 33.3 pg (ref 26.0–34.0)
MCH: 33.1 pg (ref 26.0–34.0)
MCHC: 32.8 g/dL (ref 30.0–36.0)
MCHC: 32.5 g/dL (ref 30.0–36.0)
MCV: 101.7 fL — ABNORMAL HIGH (ref 80.0–100.0)
MCV: 102 fL — ABNORMAL HIGH (ref 80.0–100.0)
Platelets: 223 10*3/uL (ref 150–400)
Platelets: 242 10*3/uL (ref 150–400)
RBC: 3.45 MIL/uL — ABNORMAL LOW (ref 4.22–5.81)
RBC: 3.44 MIL/uL — ABNORMAL LOW (ref 4.22–5.81)
RDW: 13.2 % (ref 11.5–15.5)
RDW: 13.2 % (ref 11.5–15.5)
WBC: 7.8 10*3/uL (ref 4.0–10.5)
WBC: 8.1 10*3/uL (ref 4.0–10.5)
nRBC: 0 % (ref 0.0–0.2)
nRBC: 0 % (ref 0.0–0.2)

## 2021-04-10 LAB — TYPE AND SCREEN
ABO/RH(D): B POS
Antibody Screen: NEGATIVE

## 2021-04-10 LAB — VITAMIN B12: Vitamin B-12: 793 pg/mL (ref 180–914)

## 2021-04-10 LAB — PROCALCITONIN: Procalcitonin: <0.1 ng/mL

## 2021-04-10 LAB — ECHOCARDIOGRAM COMPLETE
Area-P 1/2: 2.91 cm2
Height: 66 in
S' Lateral: 2.9 cm
Weight: 2081.14 oz

## 2021-04-10 LAB — APTT: aPTT: 34 seconds (ref 24–36)

## 2021-04-10 LAB — PROTIME-INR
INR: 1.1 (ref 0.8–1.2)
Prothrombin Time: 14.3 seconds (ref 11.4–15.2)

## 2021-04-10 MED ORDER — LOSARTAN POTASSIUM 50 MG PO TABS
100.0000 mg | ORAL_TABLET | Freq: Every day | ORAL | Status: DC
  Administered 2021-04-10 – 2021-04-12 (×3): 100 mg via ORAL
  Filled 2021-04-10 (×3): qty 2

## 2021-04-10 MED ORDER — HYDROCHLOROTHIAZIDE 25 MG PO TABS
25.0000 mg | ORAL_TABLET | Freq: Every day | ORAL | Status: DC
  Administered 2021-04-10 – 2021-04-13 (×4): 25 mg via ORAL
  Filled 2021-04-10 (×4): qty 1

## 2021-04-10 MED ORDER — LOSARTAN POTASSIUM-HCTZ 100-25 MG PO TABS: 1.0000 | ORAL_TABLET | Freq: Every evening | ORAL | Status: DC

## 2021-04-10 MED ORDER — AZITHROMYCIN 250 MG PO TABS
500.0000 mg | ORAL_TABLET | Freq: Every day | ORAL | Status: DC
  Administered 2021-04-10 – 2021-04-13 (×4): 500 mg via ORAL
  Filled 2021-04-10 (×4): qty 2

## 2021-04-10 NOTE — Progress Notes (Signed)
Progress Note    Earl Castillo  MVE:[id number] DOB: [birth year]  DOA: 04/09/2021 PCP: [name], MD      Brief Narrative:    Medical records reviewed and are as summarized below:  Earl Castillo is a 80 y.o. male with past medical history of hypertension, hyperlipidemia, CAD s/p CABG, GERD, PAD s/p left right femoral-femoral bypass graft in 2000 and recent left retinal artery occlusion in February 2022, recurrent falls, carotid artery disease s/p recent arch/cerebral angiogram on 6/3 with Dr. [name] via left common femoral puncture.  He presented to the hospital because of multiple falls at home which had occurred for about 2 days prior to admission.     Assessment/Plan:   Principal Problem:   Acute CHF (congestive heart failure) (HCC) Active Problems:   Essential hypertension   Coronary atherosclerosis   PAD (peripheral artery disease) (HCC)   Acute respiratory failure with hypoxia (HCC)   Body mass index is 20.99 kg/m.   Acute CHF: Specifics unknown.  2D echo is pending.  proBNP was 622.8.  Continue IV Lasix.  Monitor BMP, daily weights and urine output. He is afebrile and has normal WBC count.  Doubt pneumonia at this time. Repeat chest x-ray done today shows improving aeration of the lungs and continued right lower lobe atelectasis/infiltrate.  Discontinue IV antibiotics for now.  Acute hypoxic respiratory failure: Resolved.  He is tolerating room air.  Recurrent falls at home: PT and OT evaluation  CAD, PVD, symptomatic left carotid artery stenosis s/p recent R cerebral angiogram on 04/06/2021 with Dr. [name]: Continue aspirin, Plavix and statins.  No evidence of acute stroke on CT head.  Outpatient follow-up with vascular surgeon for planned complex, hybrid carotid revascularization.  Hypertension, hypertensive urgency: Continue antihypertensives.  Monitor BP closely   Diet Order            Diet Heart Room service appropriate? Yes; Fluid consistency:  Thin  Diet effective now                    Consultants:  None  Procedures:  None    Medications:   . aspirin EC  81 mg Oral QPM  . azithromycin  500 mg Oral Daily  . clopidogrel  75 mg Oral q AM  . ferrous gluconate  324 mg Oral Q breakfast  . furosemide  40 mg Intravenous BID  . heparin  5,000 Units Subcutaneous Q8H  . isosorbide mononitrate  30 mg Oral q AM  . losartan-hydrochlorothiazide  1 tablet Oral QPM  . metoprolol tartrate  50 mg Oral q AM  . NIFEdipine  90 mg Oral q AM  . simvastatin  20 mg Oral QHS  . sodium chloride flush  3 mL Intravenous Q12H  . vitamin B-12  500 mcg Oral q AM   Continuous Infusions: . sodium chloride    . cefTRIAXone (ROCEPHIN)  IV       Anti-infectives (From admission, onward)   Start     Dose/Rate Route Frequency Ordered Stop   04/10/21 1400  cefTRIAXone (ROCEPHIN) 1 g in sodium chloride 0.9 % 100 mL IVPB        1 g 200 mL/hr over 30 Minutes Intravenous Every 24 hours 04/09/21 1610     04/10/21 1400  azithromycin (ZITHROMAX) 500 mg in sodium chloride 0.9 % 250 mL IVPB  Status:  Discontinued        500 mg 250 mL/hr over 60 Minutes Intravenous Every 24  hours 04/09/21 1610 04/10/21 1102   04/10/21 1400  azithromycin (ZITHROMAX) tablet 500 mg        500 mg Oral Daily 04/10/21 1102     04/09/21 1527  ceFAZolin (ANCEF) IVPB 2g/100 mL premix  Status:  Discontinued        2 g 200 mL/hr over 30 Minutes Intravenous 30 min pre-op 04/09/21 1527 04/09/21 1609   04/09/21 1345  cefTRIAXone (ROCEPHIN) 1 g in sodium chloride 0.9 % 100 mL IVPB        1 g 200 mL/hr over 30 Minutes Intravenous  Once 04/09/21 1332 04/09/21 1430   04/09/21 1345  azithromycin (ZITHROMAX) 500 mg in sodium chloride 0.9 % 250 mL IVPB        500 mg 250 mL/hr over 60 Minutes Intravenous  Once 04/09/21 1332 04/09/21 1535             Family Communication/Anticipated D/C date and plan/Code Status   DVT prophylaxis: heparin injection 5,000 Units Start:  04/09/21 2200     Code Status: DNR  Family Communication: Discussed with his brother at the bedside Disposition Plan:    Status is: Inpatient  Remains inpatient appropriate because:IV treatments appropriate due to intensity of illness or inability to take PO   Dispo: The patient is from: Home              Anticipated d/c is to: Home              Patient currently is not medically stable to d/c.   Difficult to place patient No           Subjective:   C/o cough.  Breathing is better.  Objective:    Vitals:   04/10/21 1145 04/10/21 1200 04/10/21 1215 04/10/21 1230  BP:  (!) 150/66  (!) 171/59  Pulse: 62 (!) 58 60 (!) 58  Resp: 20 14 19  (!) 27  Temp:      TempSrc:      SpO2: 96% 96% 96% 98%  Weight:      Height:       No data found.   Intake/Output Summary (Last 24 hours) at 04/10/2021 1315 Last data filed at 04/10/2021 1244 Gross per 24 hour  Intake 339.83 ml  Output 1651 ml  Net -1311.17 ml   Filed Weights   04/09/21 0945  Weight: 59 kg    Exam:  GEN: NAD SKIN: Warm and dry EYES: No pallor or icterus ENT: MMM CV: RRR PULM: CTA B ABD: soft, ND, NT, +BS CNS: AAO x 3, non focal EXT: No edema or tenderness        Data Reviewed:   I have personally reviewed following labs and imaging studies:  Labs: Labs show the following:   Basic Metabolic Panel: Recent Labs  Lab 04/06/21 0905 04/09/21 1102 04/10/21 0230  NA 141 141 137  K 3.3* 3.5 3.9  CL 101 107 97*  CO2  --  27 31  GLUCOSE 95 116* 101*  BUN 21 15 19   CREATININE 1.10 0.86 1.14  CALCIUM  --  8.5* 8.5*   GFR Estimated Creatinine Clearance: 43.8 mL/min (by C-G formula based on SCr of 1.14 mg/dL). Liver Function Tests: Recent Labs  Lab 04/09/21 1102 04/10/21 0230  AST 15 28  ALT 10 9  ALKPHOS 51 47  BILITOT 0.6 1.6*  PROT 6.5 6.8  ALBUMIN 3.0* 2.9*   No results for input(s): LIPASE, AMYLASE in the last 168 hours. No results for input(s):  AMMONIA in the last 168  hours. Coagulation profile Recent Labs  Lab 04/10/21 0230  INR 1.1    CBC: Recent Labs  Lab 04/06/21 0905 04/09/21 1102 04/10/21 0230  WBC  --  7.4 7.8  NEUTROABS  --  5.9  --   HGB 10.2* 10.7* 11.5*  HCT 30.0* 34.3* 35.1*  MCV  --  104.9* 101.7*  PLT  --  201 223   Cardiac Enzymes: No results for input(s): CKTOTAL, CKMB, CKMBINDEX, TROPONINI in the last 168 hours. BNP (last 3 results) No results for input(s): PROBNP in the last 8760 hours. CBG: No results for input(s): GLUCAP in the last 168 hours. D-Dimer: No results for input(s): DDIMER in the last 72 hours. Hgb A1c: No results for input(s): HGBA1C in the last 72 hours. Lipid Profile: No results for input(s): CHOL, HDL, LDLCALC, TRIG, CHOLHDL, LDLDIRECT in the last 72 hours. Thyroid function studies: No results for input(s): TSH, T4TOTAL, T3FREE, THYROIDAB in the last 72 hours.  Invalid input(s): FREET3 Anemia work up: No results for input(s): VITAMINB12, FOLATE, FERRITIN, TIBC, IRON, RETICCTPCT in the last 72 hours. Sepsis Labs: Recent Labs  Lab 04/09/21 1102 04/10/21 0230  PROCALCITON  --  <0.10  WBC 7.4 7.8    Microbiology Recent Results (from the past 240 hour(s))  SARS CORONAVIRUS 2 (TAT 6-24 HRS) Nasopharyngeal Nasopharyngeal Swab     Status: None   Collection Time: 04/03/21 12:27 PM   Specimen: Nasopharyngeal Swab  Result Value Ref Range Status   SARS Coronavirus 2 NEGATIVE NEGATIVE Final    Comment: (NOTE) SARS-CoV-2 target nucleic acids are NOT DETECTED.  The SARS-CoV-2 RNA is generally detectable in upper and lower respiratory specimens during the acute phase of infection. Negative results do not preclude SARS-CoV-2 infection, do not rule out co-infections with other pathogens, and should not be used as the sole basis for treatment or other patient management decisions. Negative results must be combined with clinical observations, patient history, and epidemiological information. The  expected result is Negative.  Fact Sheet for Patients: SugarRoll.be  Fact Sheet for Healthcare Providers: https://www.woods-mathews.com/  This test is not yet approved or cleared by the [place] FDA and  has been authorized for detection and/or diagnosis of SARS-CoV-2 by FDA under an Emergency Use Authorization (EUA). This EUA will remain  in effect (meaning this test can be used) for the duration of the COVID-19 declaration under Se ction 564(b)(1) of the Act, 21 U.S.C. section 360bbb-3(b)(1), unless the authorization is terminated or revoked sooner.  Performed at [facility], [place] [address] [place], [place] [address]   Resp Panel by RT-PCR (Flu A&B, Covid) Nasopharyngeal Swab     Status: None   Collection Time: 04/09/21 11:02 AM   Specimen: Nasopharyngeal Swab; Nasopharyngeal(NP) swabs in vial transport medium  Result Value Ref Range Status   SARS Coronavirus 2 by RT PCR NEGATIVE NEGATIVE Final    Comment: (NOTE) SARS-CoV-2 target nucleic acids are NOT DETECTED.  The SARS-CoV-2 RNA is generally detectable in upper respiratory specimens during the acute phase of infection. The lowest concentration of SARS-CoV-2 viral copies this assay can detect is 138 copies/mL. A negative result does not preclude SARS-Cov-2 infection and should not be used as the sole basis for treatment or other patient management decisions. A negative result may occur with  improper specimen collection/handling, submission of specimen other than nasopharyngeal swab, presence of viral mutation(s) within the areas targeted by this assay, and inadequate number of viral copies(<138 copies/mL). A  negative result must be combined with clinical observations, patient history, and epidemiological information. The expected result is Negative.  Fact Sheet for Patients:  EntrepreneurPulse.com.au  Fact Sheet for Healthcare Providers:   IncredibleEmployment.be  This test is no t yet approved or cleared by the [place] FDA and  has been authorized for detection and/or diagnosis of SARS-CoV-2 by FDA under an Emergency Use Authorization (EUA). This EUA will remain  in effect (meaning this test can be used) for the duration of the COVID-19 declaration under Section 564(b)(1) of the Act, 21 U.S.C.section 360bbb-3(b)(1), unless the authorization is terminated  or revoked sooner.       Influenza A by PCR NEGATIVE NEGATIVE Final   Influenza B by PCR NEGATIVE NEGATIVE Final    Comment: (NOTE) The Xpert Xpress SARS-CoV-2/FLU/RSV plus assay is intended as an aid in the diagnosis of influenza from Nasopharyngeal swab specimens and should not be used as a sole basis for treatment. Nasal washings and aspirates are unacceptable for Xpert Xpress SARS-CoV-2/FLU/RSV testing.  Fact Sheet for Patients: EntrepreneurPulse.com.au  Fact Sheet for Healthcare Providers: IncredibleEmployment.be  This test is not yet approved or cleared by the [place] FDA and has been authorized for detection and/or diagnosis of SARS-CoV-2 by FDA under an Emergency Use Authorization (EUA). This EUA will remain in effect (meaning this test can be used) for the duration of the COVID-19 declaration under Section 564(b)(1) of the Act, 21 U.S.C. section 360bbb-3(b)(1), unless the authorization is terminated or revoked.  Performed at [facility], [place] [address] [place], [place] [address]     Procedures and diagnostic studies:  DG Chest 2 View  Result Date: 04/09/2021 CLINICAL DATA:  Cough, shortness of breath EXAM: CHEST - 2 VIEW COMPARISON:  08/25/2017 FINDINGS: Prior CABG. Heart is normal size. Bilateral airspace disease noted, right greater than left. Small bilateral effusions. Scattered aortic atherosclerosis. No acute bony abnormality. IMPRESSION: Bilateral airspace  disease and effusions, right greater than left. This could represent asymmetric edema or pneumonia. Electronically Signed   By: Rolm Baptise M.D.   On: 04/09/2021 10:56   CT HEAD WO CONTRAST  Result Date: 04/09/2021 CLINICAL DATA:  Left lower extremity paresthesia EXAM: CT HEAD WITHOUT CONTRAST TECHNIQUE: Contiguous axial images were obtained from the base of the skull through the vertex without intravenous contrast. COMPARISON:  None. FINDINGS: Brain: Normal anatomic configuration. Parenchymal volume loss is commensurate with the patient's age. Moderate periventricular white matter changes are present likely reflecting the sequela of small vessel ischemia. Remote right parieto-occipital cortical infarct and right thalamic lacunar infarcts are noted. No abnormal intra or extra-axial mass lesion or fluid collection. No abnormal mass effect or midline shift. No evidence of acute intracranial hemorrhage or infarct. Borderline ventriculomegaly is commensurate with the degree of parenchymal volume loss. Cerebellum unremarkable. Vascular: No asymmetric hyperdense vasculature at the skull base. Extensive atherosclerotic calcification is seen within the petrous portions of the carotid arteries and carotid siphons bilaterally. Skull: Intact Sinuses/Orbits: Paranasal sinuses are clear. Orbits are unremarkable. Other: Mastoid air cells and middle ear cavities are clear. IMPRESSION: No acute intracranial hemorrhage or infarct. Moderate senescent change. Remote right thalamic and right parieto-occipital cortical infarcts. Advanced intracranial atherosclerotic disease. Electronically Signed   By: Fidela Salisbury MD   On: 04/09/2021 22:18   CT Angio Chest PE W/Cm &/Or Wo Cm  Addendum Date: 04/10/2021   ADDENDUM REPORT: 04/10/2021 08:56 ADDENDUM: Initial report by Dr. Rosario Jacks, addendum by Dr. Jobe Igo. Upon further review, there is an acute  right eleventh rib minimally displaced fracture, including on 157/4. There are remote  bilateral lower rib fractures as well. Electronically Signed   By: Abigail Miyamoto M.D.   On: 04/10/2021 08:56   Result Date: 04/10/2021 CLINICAL DATA:  Shortness of breath and weakness.  Recent surgery. EXAM: CT ANGIOGRAPHY CHEST WITH CONTRAST TECHNIQUE: Multidetector CT imaging of the chest was performed using the standard protocol during bolus administration of intravenous contrast. Multiplanar CT image reconstructions and MIPs were obtained to evaluate the vascular anatomy. CONTRAST:  45mL OMNIPAQUE IOHEXOL 350 MG/ML SOLN COMPARISON:  None. FINDINGS: Cardiovascular: Image quality is degraded by respiratory motion. No pulmonary embolus. Atherosclerotic calcification of the aorta and coronary arteries. Pulmonic trunk and heart are enlarged. No pericardial effusion. Mediastinum/Nodes: No pathologically enlarged mediastinal, hilar or axillary lymph nodes. Esophagus is grossly unremarkable. Lungs/Pleura: Centrilobular and paraseptal emphysema. Image quality is degraded by respiratory motion. Large right, and small left, pleural effusions. Collapse/consolidation in the right lower lobe. Minimal compressive atelectasis in the left lower lobe. Basilar predominant septal thickening with patchy ground-glass in the posterior right upper lobe. Airway is unremarkable. Upper Abdomen: 3.5 cm low-attenuation lesion in the left hepatic lobe is likely a cyst. Visualized portions of the liver, gallbladder, adrenal glands and right kidney are otherwise unremarkable. Exophytic low-attenuation lesion off the left kidney measures 2.1 cm, incompletely visualized. Visualized portions of the spleen, pancreas, stomach and bowel are grossly unremarkable. Musculoskeletal: Degenerative changes in the spine. No worrisome lytic or sclerotic lesions. Flowing anterior osteophytosis in the thoracic spine. Review of the MIP images confirms the above findings. IMPRESSION: 1. Negative for pulmonary embolus. 2. Congestive heart failure with right  greater than left pleural effusions. 3. Difficult to exclude superimposed pneumonia in the right upper and right lower lobes. 4. Aortic atherosclerosis (ICD10-I70.0). Coronary artery calcification. 5. Enlarged pulmonic trunk, indicative of pulmonary arterial hypertension. 6.  Emphysema (ICD10-J43.9). Electronically Signed: By: Lorin Picket M.D. On: 04/09/2021 13:26   DG CHEST PORT 1 VIEW  Result Date: 04/10/2021 CLINICAL DATA:  Multiple falls. EXAM: PORTABLE CHEST 1 VIEW COMPARISON:  04/09/2021 FINDINGS: Prior CABG. Heart is normal size. Improving bilateral airspace disease. Continued opacity at the right lung base. No effusions. No confluent opacities on the left. No acute bony abnormality. IMPRESSION: Improving aeration of the lungs. Continued right lower lobe atelectasis or infiltrate. Electronically Signed   By: Rolm Baptise M.D.   On: 04/10/2021 11:31               LOS: 1 day   Reyden Smith  [facility]   Pager on [email or web address]. If 7PM-7AM, please contact night-coverage at [email or web address]     04/10/2021, 1:15 PM

## 2021-04-10 NOTE — ED Notes (Signed)
Family at bedside. 

## 2021-04-10 NOTE — Progress Notes (Signed)
  Echocardiogram 2D Echocardiogram has been performed.  Earl Castillo 04/10/2021, 2:33 PM

## 2021-04-10 NOTE — Progress Notes (Signed)
PHARMACIST - PHYSICIAN COMMUNICATION DR:   Mal Misty CONCERNING: Antibiotic IV to Oral Route Change Policy  RECOMMENDATION: This patient is receiving Azithromycin by the intravenous route.  Based on criteria approved by the Pharmacy and Therapeutics Committee, the antibiotic(s) is/are being converted to the equivalent oral dose form(s).   DESCRIPTION: These criteria include:  Patient being treated for a respiratory tract infection, urinary tract infection, cellulitis or clostridium difficile associated diarrhea if on metronidazole  The patient is not neutropenic and does not exhibit a GI malabsorption state  The patient is eating (either orally or via tube) and/or has been taking other orally administered medications for a least 24 hours  The patient is improving clinically and has a Tmax < 100.5  If you have questions about this conversion, please contact the Pharmacy Department  []   [phone] )  [facility] []   [phone] )  [facility] []   [phone] )  [facility] []   [phone] )  [facility] [x]   [phone] )  [facility]

## 2021-04-10 NOTE — ED Notes (Signed)
Patient had a bowel movement medium, formed, brown, rectum

## 2021-04-10 NOTE — Evaluation (Signed)
Physical Therapy Evaluation Patient Details Name: Earl Castillo MRN: [id number] DOB: 05/06/1941 Today's Date: 04/10/2021   History of Present Illness  80 year old male  who presented to the ED with multiple falls over the past 2 days. Dx of CHF. Pt with past medical history of hypertension, hyperlipidemia, CAD s/p CABG, GERD, PAD s/p left right femoral-femoral bypass graft in 2000 and recent left retinal artery occlusion in February 2022, recurrent falls, carotid artery disease s/p recent arch/cerebral angiogram on 6/3 with Dr. [name] via left common femoral puncture.  Clinical Impression  Pt admitted with above diagnosis. Pt ambulated 20' with RW, distance limited by fatigue, SaO2 92% on room air walking, no loss of balance. HHPT recommended as well as supervision for mobility 2* multiple recent falls.  Pt currently with functional limitations due to the deficits listed below (see PT Problem List). Pt will benefit from skilled PT to increase their independence and safety with mobility to allow discharge to the venue listed below.       Follow Up Recommendations Home health PT; supervision for mobility    Equipment Recommendations  None recommended by PT    Recommendations for Other Services       Precautions / Restrictions Precautions Precautions: Fall Precaution Comments: multiple falls in past 6 months ("too many to count") Restrictions Weight Bearing Restrictions: No      Mobility  Bed Mobility Overal bed mobility: Modified Independent Bed Mobility: Supine to Sit;Sit to Supine     Supine to sit: HOB elevated Sit to supine: HOB elevated   General bed mobility comments: no assist needed    Transfers Overall transfer level: Needs assistance Equipment used: Rolling walker (2 wheeled) Transfers: Sit to/from Stand Sit to Stand: Min guard         General transfer comment: min/guard for safety 2* h/o multiple recent falls  Ambulation/Gait Ambulation/Gait assistance:  Min guard Gait Distance (Feet): 20 Feet Assistive device: Rolling walker (2 wheeled) Gait Pattern/deviations: Step-through pattern;Decreased stride length;Shuffle;Narrow base of support Gait velocity: WFL   General Gait Details: no loss of balance, SaO2 92% on room air, 2/4 dyspnea  Stairs            Wheelchair Mobility    Modified Rankin (Stroke Patients Only)       Balance   Sitting-balance support: No upper extremity supported;Feet unsupported Sitting balance-Leahy Scale: Good       Standing balance-Leahy Scale: Fair Standing balance comment: relies on BUE support for ambulating                             Pertinent Vitals/Pain Pain Assessment: No/denies pain    Home Living Family/patient expects to be discharged to:: Private residence Living Arrangements: Children Available Help at Discharge: Family Type of Home: House Home Access: Stairs to enter Entrance Stairs-Rails: None Entrance Stairs-Number of Steps: 1 Home Layout: One level Home Equipment: Cane - single point;Walker - 2 wheels;Shower seat Additional Comments: lives with son and other son is next door. Pt's son has to work full time. "There's usually someone there with me".    Prior Function Level of Independence: Independent with assistive device(s)         Comments: independent bathing/dressing, walks with RW but has had multiple falls     Hand Dominance   Dominant Hand: Right    Extremity/Trunk Assessment   Upper Extremity Assessment Upper Extremity Assessment: Overall WFL for tasks assessed    Lower Extremity  Assessment Lower Extremity Assessment: Overall WFL for tasks assessed (B knee ext +4/5)    Cervical / Trunk Assessment Cervical / Trunk Assessment: Kyphotic  Communication   Communication: No difficulties  Cognition Arousal/Alertness: Awake/alert Behavior During Therapy: WFL for tasks assessed/performed Overall Cognitive Status: Within Functional Limits for  tasks assessed                                        General Comments      Exercises     Assessment/Plan    PT Assessment Patient needs continued PT services  PT Problem List Decreased mobility;Decreased activity tolerance       PT Treatment Interventions DME instruction;Gait training;Functional mobility training;Therapeutic activities;Therapeutic exercise;Balance training;Patient/family education    PT Goals (Current goals can be found in the Care Plan section)  Acute Rehab PT Goals Patient Stated Goal: stop falling and get stronger PT Goal Formulation: With patient Time For Goal Achievement: 03/15/21 Potential to Achieve Goals: Good    Frequency Min 3X/week   Barriers to discharge        Co-evaluation               AM-PAC PT "6 Clicks" Mobility  Outcome Measure Help needed turning from your back to your side while in a flat bed without using bedrails?: None Help needed moving from lying on your back to sitting on the side of a flat bed without using bedrails?: A Little Help needed moving to and from a bed to a chair (including a wheelchair)?: A Little Help needed standing up from a chair using your arms (e.g., wheelchair or bedside chair)?: A Little Help needed to walk in hospital room?: A Little Help needed climbing 3-5 steps with a railing? : A Little 6 Click Score: 19    End of Session Equipment Utilized During Treatment: Gait belt Activity Tolerance: Patient tolerated treatment well Patient left: in bed;with call bell/phone within reach;with bed alarm set Nurse Communication: Mobility status PT Visit Diagnosis: Muscle weakness (generalized) (M62.81);Difficulty in walking, not elsewhere classified (R26.2);Unsteadiness on feet (R26.81)    Time: 4496-7591 PT Time Calculation (min) (ACUTE ONLY): 23 min   Charges:   PT Evaluation $PT Eval Moderate Complexity: 1 Mod PT Treatments $Gait Training: 8-22 mins       Earl Castillo  [facility] 04/10/2021  Acute Rehabilitation Services Pager [phone] Office [phone]

## 2021-04-11 DIAGNOSIS — E43 Unspecified severe protein-calorie malnutrition: Secondary | ICD-10-CM | POA: Insufficient documentation

## 2021-04-11 LAB — CBC
HCT: 35.5 % — ABNORMAL LOW (ref 39.0–52.0)
Hemoglobin: 11.3 g/dL — ABNORMAL LOW (ref 13.0–17.0)
MCH: 32.6 pg (ref 26.0–34.0)
MCHC: 31.8 g/dL (ref 30.0–36.0)
MCV: 102.3 fL — ABNORMAL HIGH (ref 80.0–100.0)
Platelets: 249 10*3/uL (ref 150–400)
RBC: 3.47 MIL/uL — ABNORMAL LOW (ref 4.22–5.81)
RDW: 13.2 % (ref 11.5–15.5)
WBC: 9 10*3/uL (ref 4.0–10.5)
nRBC: 0 % (ref 0.0–0.2)

## 2021-04-11 LAB — BASIC METABOLIC PANEL
Anion gap: 10 (ref 5–15)
BUN: 32 mg/dL — ABNORMAL HIGH (ref 8–23)
CO2: 31 mmol/L (ref 22–32)
Calcium: 8.5 mg/dL — ABNORMAL LOW (ref 8.9–10.3)
Chloride: 97 mmol/L — ABNORMAL LOW (ref 98–111)
Creatinine, Ser: 1.53 mg/dL — ABNORMAL HIGH (ref 0.61–1.24)
GFR, Estimated: 46 mL/min — ABNORMAL LOW (ref >60)
Glucose, Bld: 116 mg/dL — ABNORMAL HIGH (ref 70–99)
Potassium: 3.7 mmol/L (ref 3.5–5.1)
Sodium: 138 mmol/L (ref 135–145)

## 2021-04-11 LAB — PROCALCITONIN: Procalcitonin: <0.1 ng/mL

## 2021-04-11 LAB — MAGNESIUM: Magnesium: 2.1 mg/dL (ref 1.7–2.4)

## 2021-04-11 NOTE — Progress Notes (Signed)
PROGRESS NOTE    DESTEN MANOR  QAS:[id number] DOB: [birth year] DOA: 04/09/2021 PCP: [name], MD   Brief Narrative:  This 80 years old male with PMH significant for hypertension, hyperlipidemia, CAD s/p CABG, GERD, PAD s/p left right femoral-femoral bypass graft in 2000 and recent left retinal artery occlusion in February 2022, recurrent falls, carotid artery disease s/p recent large cerebral angiogram on 6/3 with Dr. [name] via left common femoral puncture.  He presented to the hospital because of multiple falls at home which had occurred for about 2 days prior to the admission.  Assessment & Plan:   Principal Problem:   Acute CHF (congestive heart failure) (HCC) Active Problems:   Essential hypertension   Coronary atherosclerosis   PAD (peripheral artery disease) (HCC)   Acute respiratory failure with hypoxia (HCC)   Protein-calorie malnutrition, severe   Acute hypoxic respiratory failure secondary to new diastolic CHF:  BNP was 798.9.  Continue IV Lasix.  Monitor BMP, daily weights and urine output. He is afebrile and has normal WBC count.  Doubt pneumonia at this time. Repeat chest x-ray done today shows improving aeration of the lungs and continued right lower lobe atelectasis/infiltrate. Discontinue IV antibiotics for now. Echocardiogram shows EF 60 to 65%.  No regional wall motion abnormalities.  Acute hypoxic respiratory failure:  > Resolved.  He is tolerating room air.  Recurrent falls at home: Continue OT recommended home with home health services.  CAD, PVD, symptomatic left carotid artery stenosis s/p recent R cerebral angiogram on 04/06/2021 with Dr. [name]:  Continue aspirin, Plavix and statins.  No evidence of acute stroke on CT head.   Outpatient follow-up with vascular surgeon for planned complex, hybrid carotid revascularization.  Hypertension: Continue antihypertensives.  Monitor BP closely.    DVT prophylaxis: Heparin sq Code Status: DNR Family  Communication: No family at bed side. Disposition Plan:    Status is: Inpatient  Remains inpatient appropriate because:Inpatient level of care appropriate due to severity of illness   Dispo: The patient is from: Home              Anticipated d/c is to: Home              Patient currently is not medically stable to d/c.   Difficult to place patient No   Consultants:    None  Procedures: None Antimicrobials:  Anti-infectives (From admission, onward)   Start     Dose/Rate Route Frequency Ordered Stop   04/10/21 1400  cefTRIAXone (ROCEPHIN) 1 g in sodium chloride 0.9 % 100 mL IVPB        1 g 200 mL/hr over 30 Minutes Intravenous Every 24 hours 04/09/21 1610     04/10/21 1400  azithromycin (ZITHROMAX) 500 mg in sodium chloride 0.9 % 250 mL IVPB  Status:  Discontinued        500 mg 250 mL/hr over 60 Minutes Intravenous Every 24 hours 04/09/21 1610 04/10/21 1102   04/10/21 1400  azithromycin (ZITHROMAX) tablet 500 mg        500 mg Oral Daily 04/10/21 1102     04/09/21 1527  ceFAZolin (ANCEF) IVPB 2g/100 mL premix  Status:  Discontinued        2 g 200 mL/hr over 30 Minutes Intravenous 30 min pre-op 04/09/21 1527 04/09/21 1609   04/09/21 1345  cefTRIAXone (ROCEPHIN) 1 g in sodium chloride 0.9 % 100 mL IVPB        1 g 200 mL/hr over 30 Minutes Intravenous  Once 04/09/21 1332 04/09/21 1430   04/09/21 1345  azithromycin (ZITHROMAX) 500 mg in sodium chloride 0.9 % 250 mL IVPB        500 mg 250 mL/hr over 60 Minutes Intravenous  Once 04/09/21 1332 04/09/21 1535     Subjective: Patient was seen and examined at bedside.  Overnight events noted.  Patient denies any chest pain, shortness of breath, leg edema has resolved.  Objective: Vitals:   04/11/21 0110 04/11/21 0431 04/11/21 0500 04/11/21 1258  BP:  (!) 128/52  (!) 133/47  Pulse:  64  (!) 58  Resp:  20  15  Temp:  98 F (36.7 C)  98 F (36.7 C)  TempSrc:  Oral  Oral  SpO2: 97% 92%  90%  Weight:   52.8 kg   Height:         Intake/Output Summary (Last 24 hours) at 04/11/2021 1500 Last data filed at 04/11/2021 1255 Gross per 24 hour  Intake 440 ml  Output 1950 ml  Net -1510 ml   Filed Weights   04/09/21 0945 04/11/21 0500  Weight: 59 kg 52.8 kg    Examination:  General exam: Appears calm and comfortable , not in any acute distress. Respiratory system: Clear to auscultation. Respiratory effort normal. Cardiovascular system: S1 & S2 heard, RRR. No JVD, murmurs, rubs, gallops or clicks. No pedal edema. Gastrointestinal system: Abdomen is nondistended, soft and nontender. No organomegaly or masses felt. Normal bowel sounds heard. Central nervous system: Alert and oriented. No focal neurological deficits. Extremities: Symmetric 5 x 5 power.  No edema, no cyanosis, no clubbing. Skin: No rashes, lesions or ulcers Psychiatry: Judgement and insight appear normal. Mood & affect appropriate.     Data Reviewed: I have personally reviewed following labs and imaging studies  CBC: Recent Labs  Lab 04/06/21 0905 04/09/21 1102 04/10/21 0230 04/10/21 1509 04/11/21 0525  WBC  --  7.4 7.8 8.1 9.0  NEUTROABS  --  5.9  --   --   --   HGB 10.2* 10.7* 11.5* 11.4* 11.3*  HCT 30.0* 34.3* 35.1* 35.1* 35.5*  MCV  --  104.9* 101.7* 102.0* 102.3*  PLT  --  201 223 242 353   Basic Metabolic Panel: Recent Labs  Lab 04/06/21 0905 04/09/21 1102 04/10/21 0230 04/10/21 1509 04/11/21 0525  NA 141 141 137 138 138  K 3.3* 3.5 3.9 3.4* 3.7  CL 101 107 97* 98 97*  CO2  --  27 31 34* 31  GLUCOSE 95 116* 101* 145* 116*  BUN 21 15 19 23  32*  CREATININE 1.10 0.86 1.14 1.56* 1.53*  CALCIUM  --  8.5* 8.5* 8.4* 8.5*  MG  --   --   --   --  2.1   GFR: Estimated Creatinine Clearance: 29.2 mL/min (A) (by C-G formula based on SCr of 1.53 mg/dL (H)). Liver Function Tests: Recent Labs  Lab 04/09/21 1102 04/10/21 0230  AST 15 28  ALT 10 9  ALKPHOS 51 47  BILITOT 0.6 1.6*  PROT 6.5 6.8  ALBUMIN 3.0* 2.9*   No results  for input(s): LIPASE, AMYLASE in the last 168 hours. No results for input(s): AMMONIA in the last 168 hours. Coagulation Profile: Recent Labs  Lab 04/10/21 0230  INR 1.1   Cardiac Enzymes: No results for input(s): CKTOTAL, CKMB, CKMBINDEX, TROPONINI in the last 168 hours. BNP (last 3 results) No results for input(s): PROBNP in the last 8760 hours. HbA1C: No results for input(s): HGBA1C in the  last 72 hours. CBG: No results for input(s): GLUCAP in the last 168 hours. Lipid Profile: No results for input(s): CHOL, HDL, LDLCALC, TRIG, CHOLHDL, LDLDIRECT in the last 72 hours. Thyroid Function Tests: No results for input(s): TSH, T4TOTAL, FREET4, T3FREE, THYROIDAB in the last 72 hours. Anemia Panel: Recent Labs    04/10/21 [place]   Sepsis Labs: Recent Labs  Lab 04/10/21 0230  PROCALCITON <0.10    Recent Results (from the past 240 hour(s))  SARS CORONAVIRUS 2 (TAT 6-24 HRS) Nasopharyngeal Nasopharyngeal Swab     Status: None   Collection Time: 04/03/21 12:27 PM   Specimen: Nasopharyngeal Swab  Result Value Ref Range Status   SARS Coronavirus 2 NEGATIVE NEGATIVE Final    Comment: (NOTE) SARS-CoV-2 target nucleic acids are NOT DETECTED.  The SARS-CoV-2 RNA is generally detectable in upper and lower respiratory specimens during the acute phase of infection. Negative results do not preclude SARS-CoV-2 infection, do not rule out co-infections with other pathogens, and should not be used as the sole basis for treatment or other patient management decisions. Negative results must be combined with clinical observations, patient history, and epidemiological information. The expected result is Negative.  Fact Sheet for Patients: SugarRoll.be  Fact Sheet for Healthcare Providers: https://www.woods-mathews.com/  This test is not yet approved or cleared by the [place] FDA and  has been authorized for detection and/or  diagnosis of SARS-CoV-2 by FDA under an Emergency Use Authorization (EUA). This EUA will remain  in effect (meaning this test can be used) for the duration of the COVID-19 declaration under Se ction 564(b)(1) of the Act, 21 U.S.C. section 360bbb-3(b)(1), unless the authorization is terminated or revoked sooner.  Performed at [facility], [place] [address], [place], [place] [address]   Resp Panel by RT-PCR (Flu A&B, Covid) Nasopharyngeal Swab     Status: None   Collection Time: 04/09/21 11:02 AM   Specimen: Nasopharyngeal Swab; Nasopharyngeal(NP) swabs in vial transport medium  Result Value Ref Range Status   SARS Coronavirus 2 by RT PCR NEGATIVE NEGATIVE Final    Comment: (NOTE) SARS-CoV-2 target nucleic acids are NOT DETECTED.  The SARS-CoV-2 RNA is generally detectable in upper respiratory specimens during the acute phase of infection. The lowest concentration of SARS-CoV-2 viral copies this assay can detect is 138 copies/mL. A negative result does not preclude SARS-Cov-2 infection and should not be used as the sole basis for treatment or other patient management decisions. A negative result may occur with  improper specimen collection/handling, submission of specimen other than nasopharyngeal swab, presence of viral mutation(s) within the areas targeted by this assay, and inadequate number of viral copies(<138 copies/mL). A negative result must be combined with clinical observations, patient history, and epidemiological information. The expected result is Negative.  Fact Sheet for Patients:  EntrepreneurPulse.com.au  Fact Sheet for Healthcare Providers:  IncredibleEmployment.be  This test is no t yet approved or cleared by the [place] FDA and  has been authorized for detection and/or diagnosis of SARS-CoV-2 by FDA under an Emergency Use Authorization (EUA). This EUA will remain  in effect (meaning this test can be used) for the  duration of the COVID-19 declaration under Section 564(b)(1) of the Act, 21 U.S.C.section 360bbb-3(b)(1), unless the authorization is terminated  or revoked sooner.       Influenza A by PCR NEGATIVE NEGATIVE Final   Influenza B by PCR NEGATIVE NEGATIVE Final    Comment: (NOTE) The Xpert Xpress SARS-CoV-2/FLU/RSV plus assay is  intended as an aid in the diagnosis of influenza from Nasopharyngeal swab specimens and should not be used as a sole basis for treatment. Nasal washings and aspirates are unacceptable for Xpert Xpress SARS-CoV-2/FLU/RSV testing.  Fact Sheet for Patients: EntrepreneurPulse.com.au  Fact Sheet for Healthcare Providers: IncredibleEmployment.be  This test is not yet approved or cleared by the [place] FDA and has been authorized for detection and/or diagnosis of SARS-CoV-2 by FDA under an Emergency Use Authorization (EUA). This EUA will remain in effect (meaning this test can be used) for the duration of the COVID-19 declaration under Section 564(b)(1) of the Act, 21 U.S.C. section 360bbb-3(b)(1), unless the authorization is terminated or revoked.  Performed at [facility], [place] [address], [place], [place] [address]     Radiology Studies: CT HEAD WO CONTRAST  Result Date: 04/09/2021 CLINICAL DATA:  Left lower extremity paresthesia EXAM: CT HEAD WITHOUT CONTRAST TECHNIQUE: Contiguous axial images were obtained from the base of the skull through the vertex without intravenous contrast. COMPARISON:  None. FINDINGS: Brain: Normal anatomic configuration. Parenchymal volume loss is commensurate with the patient's age. Moderate periventricular white matter changes are present likely reflecting the sequela of small vessel ischemia. Remote right parieto-occipital cortical infarct and right thalamic lacunar infarcts are noted. No abnormal intra or extra-axial mass lesion or fluid collection. No abnormal mass effect  or midline shift. No evidence of acute intracranial hemorrhage or infarct. Borderline ventriculomegaly is commensurate with the degree of parenchymal volume loss. Cerebellum unremarkable. Vascular: No asymmetric hyperdense vasculature at the skull base. Extensive atherosclerotic calcification is seen within the petrous portions of the carotid arteries and carotid siphons bilaterally. Skull: Intact Sinuses/Orbits: Paranasal sinuses are clear. Orbits are unremarkable. Other: Mastoid air cells and middle ear cavities are clear. IMPRESSION: No acute intracranial hemorrhage or infarct. Moderate senescent change. Remote right thalamic and right parieto-occipital cortical infarcts. Advanced intracranial atherosclerotic disease. Electronically Signed   By: Fidela Salisbury MD   On: 04/09/2021 22:18   DG CHEST PORT 1 VIEW  Result Date: 04/10/2021 CLINICAL DATA:  Multiple falls. EXAM: PORTABLE CHEST 1 VIEW COMPARISON:  04/09/2021 FINDINGS: Prior CABG. Heart is normal size. Improving bilateral airspace disease. Continued opacity at the right lung base. No effusions. No confluent opacities on the left. No acute bony abnormality. IMPRESSION: Improving aeration of the lungs. Continued right lower lobe atelectasis or infiltrate. Electronically Signed   By: Rolm Baptise M.D.   On: 04/10/2021 11:31   ECHOCARDIOGRAM COMPLETE  Result Date: 04/10/2021    ECHOCARDIOGRAM REPORT   Patient Name:   Nils CESAR ROGERSON Date of Exam: 04/10/2021 Medical Rec #:  [id number]       Height:       66.0 in Accession #:    [id number]      Weight:       130.1 lb Date of Birth:  1941-07-09        BSA:          1.666 m Patient Age:    79 years        BP:           176/60 mmHg Patient Gender: M               HR:           51 bpm. Exam Location:  Inpatient Procedure: 2D Echo, Color Doppler and Cardiac Doppler Indications:    Congestive Heart Failure I50.9  History:        Patient has prior history  of Echocardiogram examinations, most                 recent  08/02/2013. CAD, Stroke; Risk Factors:Hypertension and                 Dyslipidemia.  Sonographer:    Bernadene Person RDCS Referring Phys: [id number] [place]  1. Left ventricular ejection fraction, by estimation, is 65 to 70%. The left ventricle has normal function. The left ventricle has no regional wall motion abnormalities. Left ventricular diastolic parameters were normal.  2. Right ventricular systolic function is normal. The right ventricular size is normal. There is normal pulmonary artery systolic pressure.  3. The mitral valve is normal in structure. No evidence of mitral valve regurgitation. No evidence of mitral stenosis.  4. The aortic valve is normal in structure. Aortic valve regurgitation is not visualized. Mild aortic valve sclerosis is present, with no evidence of aortic valve stenosis.  5. The inferior vena cava is normal in size with greater than 50% respiratory variability, suggesting right atrial pressure of 3 mmHg. FINDINGS  Left Ventricle: Left ventricular ejection fraction, by estimation, is 65 to 70%. The left ventricle has normal function. The left ventricle has no regional wall motion abnormalities. The left ventricular internal cavity size was normal in size. There is  no left ventricular hypertrophy. Left ventricular diastolic parameters were normal. Normal left ventricular filling pressure. Right Ventricle: The right ventricular size is normal. No increase in right ventricular wall thickness. Right ventricular systolic function is normal. There is normal pulmonary artery systolic pressure. The tricuspid regurgitant velocity is 2.63 m/s, and  with an assumed right atrial pressure of 3 mmHg, the estimated right ventricular systolic pressure is 94.8 mmHg. Left Atrium: Left atrial size was normal in size. Right Atrium: Right atrial size was normal in size. Pericardium: There is no evidence of pericardial effusion. Mitral Valve: The mitral valve is normal in structure. No  evidence of mitral valve regurgitation. No evidence of mitral valve stenosis. Tricuspid Valve: The tricuspid valve is normal in structure. Tricuspid valve regurgitation is trivial. No evidence of tricuspid stenosis. Aortic Valve: The aortic valve is normal in structure. Aortic valve regurgitation is not visualized. Mild aortic valve sclerosis is present, with no evidence of aortic valve stenosis. Pulmonic Valve: The pulmonic valve was normal in structure. Pulmonic valve regurgitation is not visualized. No evidence of pulmonic stenosis. Aorta: The aortic root is normal in size and structure. Venous: The inferior vena cava is normal in size with greater than 50% respiratory variability, suggesting right atrial pressure of 3 mmHg. IAS/Shunts: No atrial level shunt detected by color flow Doppler.  LEFT VENTRICLE PLAX 2D LVIDd:         4.20 cm  Diastology LVIDs:         2.90 cm  LV e' medial:    4.95 cm/s LV PW:         1.00 cm  LV E/e' medial:  12.5 LV IVS:        0.90 cm  LV e' lateral:   6.98 cm/s LVOT diam:     2.10 cm  LV E/e' lateral: 8.9 LV SV:         53 LV SV Index:   32 LVOT Area:     3.46 cm  RIGHT VENTRICLE RV S prime:     7.60 cm/s TAPSE (M-mode): 1.2 cm LEFT ATRIUM             Index  RIGHT ATRIUM          Index LA diam:        2.60 cm 1.56 cm/m  RA Area:     6.95 cm LA Vol (A2C):   44.2 ml 26.54 ml/m RA Volume:   11.80 ml 7.08 ml/m LA Vol (A4C):   48.9 ml 29.36 ml/m LA Biplane Vol: 47.4 ml 28.46 ml/m  AORTIC VALVE LVOT Vmax:   76.00 cm/s LVOT Vmean:  43.700 cm/s LVOT VTI:    0.152 m  AORTA Ao Root diam: 3.50 cm Ao Asc diam:  3.40 cm MITRAL VALVE               TRICUSPID VALVE MV Area (PHT): 2.91 cm    TR Peak grad:   27.7 mmHg MV Decel Time: 261 msec    TR Vmax:        263.00 cm/s MV E velocity: 61.80 cm/s MV A velocity: 57.60 cm/s  SHUNTS MV E/A ratio:  1.07        Systemic VTI:  0.15 m                            Systemic Diam: 2.10 cm Mihai Croitoru MD Electronically signed by Sanda Klein  MD Signature Date/Time: 04/10/2021/2:38:52 PM    Final    Scheduled Meds: . aspirin EC  81 mg Oral QPM  . azithromycin  500 mg Oral Daily  . clopidogrel  75 mg Oral q AM  . ferrous gluconate  324 mg Oral Q breakfast  . furosemide  40 mg Intravenous BID  . heparin  5,000 Units Subcutaneous Q8H  . losartan  100 mg Oral Daily   And  . hydrochlorothiazide  25 mg Oral Daily  . isosorbide mononitrate  30 mg Oral q AM  . metoprolol tartrate  50 mg Oral q AM  . NIFEdipine  90 mg Oral q AM  . simvastatin  20 mg Oral QHS  . sodium chloride flush  3 mL Intravenous Q12H  . vitamin B-12  500 mcg Oral q AM   Continuous Infusions: . sodium chloride    . cefTRIAXone (ROCEPHIN)  IV 1 g (04/11/21 1440)     LOS: 2 days    Time spent: 35 mins    Rhyan Wolters, MD [facility]   If 7PM-7AM, please contact night-coverage

## 2021-04-11 NOTE — Progress Notes (Signed)
Assumed care of patient, agree with previous RN assessment

## 2021-04-11 NOTE — Progress Notes (Signed)
Physical Therapy Treatment Patient Details Name: Earl Castillo MRN: [id number] DOB: 1941-04-27 Today's Date: 04/11/2021    History of Present Illness 80 year old male  who presented to the ED with multiple falls over the past 2 days. Dx of CHF. Pt with past medical history of hypertension, hyperlipidemia, CAD s/p CABG, GERD, PAD s/p left right femoral-femoral bypass graft in 2000 and recent left retinal artery occlusion in February 2022, recurrent falls, carotid artery disease s/p recent arch/cerebral angiogram on 6/3 with Dr. [name] via left common femoral puncture.    PT Comments    Pt had a decline in activity tolerance today. Mod assist for supine to sit. In sitting, pt reported significant dizziness which did not resolve after sitting for ~6 minutes. Pt had posterior loss of balance while sitting at edge of bed. BP sitting 117/74, supine 139/68, RN notified.  Performed BLE exercises in supine.    Follow Up Recommendations  Home health PT;Supervision for mobility/OOB     Equipment Recommendations  None recommended by PT    Recommendations for Other Services       Precautions / Restrictions Precautions Precautions: Fall Precaution Comments: multiple falls in past 6 months ("too many to count") Restrictions Weight Bearing Restrictions: No    Mobility  Bed Mobility   Bed Mobility: Supine to Sit;Sit to Supine     Supine to sit: HOB elevated;Mod assist Sit to supine: Max assist   General bed mobility comments: dizzy in sitting, did not resolve after sitting ~6 minutes, BP sitting 117/74, BP supine 139/68, posterior LOB in sitting , min to Mod A for sitting balance with RUE holding rail and feet supported. SaO2 98% on room air at rest, 92% on room air with exercises in supine.    Transfers                    Ambulation/Gait                 Stairs             Wheelchair Mobility    Modified Rankin (Stroke Patients Only)       Balance    Sitting-balance support: Feet supported;Single extremity supported Sitting balance-Leahy Scale: Zero                                      Cognition Arousal/Alertness: Awake/alert Behavior During Therapy: WFL for tasks assessed/performed Overall Cognitive Status: Within Functional Limits for tasks assessed                                        Exercises General Exercises - Lower Extremity Ankle Circles/Pumps: AROM;Both;10 reps;Supine Heel Slides: AROM;Both;10 reps;Supine Straight Leg Raises: AROM;Both;10 reps;Supine    General Comments General comments (skin integrity, edema, etc.): SaO2 92% on RA with bed level exercises, 3/4 dyspnea      Pertinent Vitals/Pain Pain Assessment: No/denies pain    Home Living                      Prior Function            PT Goals (current goals can now be found in the care plan section) Acute Rehab PT Goals Patient Stated Goal: stop falling and get stronger PT Goal Formulation: With patient Time For Goal  Achievement: 03/15/21 Potential to Achieve Goals: Good Progress towards PT goals: Not progressing toward goals - comment (orthostatic)    Frequency    Min 3X/week      PT Plan Current plan remains appropriate    Co-evaluation              AM-PAC PT "6 Clicks" Mobility   Outcome Measure  Help needed turning from your back to your side while in a flat bed without using bedrails?: A Little Help needed moving from lying on your back to sitting on the side of a flat bed without using bedrails?: A Lot Help needed moving to and from a bed to a chair (including a wheelchair)?: A Lot Help needed standing up from a chair using your arms (e.g., wheelchair or bedside chair)?: A Lot Help needed to walk in hospital room?: Total Help needed climbing 3-5 steps with a railing? : Total 6 Click Score: 11    End of Session   Activity Tolerance: Treatment limited secondary to medical  complications (Comment) (orthostatic) Patient left: in bed;with call bell/phone within reach;with bed alarm set Nurse Communication: Mobility status PT Visit Diagnosis: Muscle weakness (generalized) (M62.81);Difficulty in walking, not elsewhere classified (R26.2);Unsteadiness on feet (R26.81)     Time: 8889-1694 PT Time Calculation (min) (ACUTE ONLY): 22 min  Charges:  $Therapeutic Activity: 8-22 mins                     Blondell Reveal Kistler PT 04/11/2021  Acute Rehabilitation Services Pager [phone] Office [phone]

## 2021-04-11 NOTE — Progress Notes (Signed)
Initial Nutrition Assessment  DOCUMENTATION CODES:   Severe malnutrition in context of chronic illness  INTERVENTION:  - will order Ensure Enlive BID, each supplement provides 350 kcal and 20 grams of protein.   NUTRITION DIAGNOSIS:   Severe Malnutrition related to chronic illness (extensive cardiac hx) as evidenced by severe fat depletion,severe muscle depletion.  GOAL:   Patient will meet greater than or equal to 90% of their needs  MONITOR:   PO intake,Supplement acceptance,Labs,Weight trends  REASON FOR ASSESSMENT:   Malnutrition Screening Tool  ASSESSMENT:   80 y.o. male with medical history of HTN, HLD, CAD s/p CABG, GERD, PAD s/p left R femoral-femoral bypass graft in 02-06-99 and L retinal artery occlusion in 12/2020, recurrent falls, CAD s/p recent arch/cerebral angiogram on 04/06/2021 via L common femoral puncture.  He presented to the ED d/t multiple falls at home in the 2 days prior to presentation.  Patient ate 100% of lunch yesterday (578 kcal and 31 grams protein). Patient laying in bed with no family or visitors present.  He reports eating 100% of breakfast this morning which consisted of [place] sausage, scrambled eggs, and oatmeal. He is missing several front teeth but denies any chewing difficulties with any foods. He does report intermittent feeling of getting choked on foods but states that there are no foods in particular that cause this.   He lives with one son and his other son lives next door. Patient prepares his breakfast and lunch meals, has access to adequate foods to snack on each day, and the son he lives with is a good cook and makes dinner each evening.   Patient reports that he weighed ~156 lb in Feb 05, 2017 but that his wife died in 06-08-23 of that year and soon after he dropped down to 135 lb. He states that since that time he has slowly continued to lose weight.   Weight today is documented as 116 lb and weight on 4/25 was documented as 133 lb. This indicates  17 lb weight loss (12.8% body weight) in the past 1.5 months; significant for time frame.  Patient does report several falls PTA and that he has a walker and cane at home to aid with ambulation. He states he does utilize these when ambulating.    Labs reviewed; Cl: 97 mmol/l, BUN: 32 mg/dl, creatinine: 1.53 mg/dl, Ca: 8.5 mg/dl, GFR: 46 ml/min. Medications reviewed; 40 mg IV lasix BID, 25 mg hydrodiuril/day, 500 mcg oral cyanocobalamin/day.     NUTRITION - FOCUSED PHYSICAL EXAM:  Flowsheet Row Most Recent Value  Orbital Region Moderate depletion  Upper Arm Region Severe depletion  Thoracic and Lumbar Region Severe depletion  Buccal Region Moderate depletion  Temple Region Severe depletion  Clavicle Bone Region Severe depletion  Clavicle and Acromion Bone Region Moderate depletion  Scapular Bone Region Moderate depletion  Dorsal Hand Severe depletion  Patellar Region Severe depletion  Anterior Thigh Region Severe depletion  Posterior Calf Region Severe depletion  Edema (RD Assessment) None  Hair Reviewed  Eyes Reviewed  Mouth Reviewed  [missing several front teeth]  Skin Reviewed  Nails Reviewed       Diet Order:   Diet Order            Diet Heart Room service appropriate? Yes; Fluid consistency: Thin  Diet effective now                 EDUCATION NEEDS:   No education needs have been identified at this time  Skin:  Skin  Assessment: Reviewed RN Assessment  Last BM:  6/7 (type 2 x1)  Height:   Ht Readings from Last 1 Encounters:  04/09/21 5\' 6"  (1.676 m)    Weight:   Wt Readings from Last 1 Encounters:  04/11/21 52.8 kg    Estimated Nutritional Needs:  Kcal:  1580-1800 kcal Protein:  75-85 grams Fluid:  >/= 1.7 L/day     Jarome Matin, MS, RD, LDN, CNSC Inpatient Clinical Dietitian RD pager # available in [facility]  After hours/weekend pager # available in [facility]

## 2021-04-12 ENCOUNTER — Inpatient Hospital Stay (HOSPITAL_COMMUNITY): Payer: Medicare Other

## 2021-04-12 LAB — CBC
HCT: 35.5 % — ABNORMAL LOW (ref 39.0–52.0)
Hemoglobin: 11.5 g/dL — ABNORMAL LOW (ref 13.0–17.0)
MCH: 32.6 pg (ref 26.0–34.0)
MCHC: 32.4 g/dL (ref 30.0–36.0)
MCV: 100.6 fL — ABNORMAL HIGH (ref 80.0–100.0)
Platelets: 250 10*3/uL (ref 150–400)
RBC: 3.53 MIL/uL — ABNORMAL LOW (ref 4.22–5.81)
RDW: 13 % (ref 11.5–15.5)
WBC: 8.5 10*3/uL (ref 4.0–10.5)
nRBC: 0 % (ref 0.0–0.2)

## 2021-04-12 LAB — BASIC METABOLIC PANEL
Anion gap: 9 (ref 5–15)
BUN: 38 mg/dL — ABNORMAL HIGH (ref 8–23)
CO2: 33 mmol/L — ABNORMAL HIGH (ref 22–32)
Calcium: 8.5 mg/dL — ABNORMAL LOW (ref 8.9–10.3)
Chloride: 94 mmol/L — ABNORMAL LOW (ref 98–111)
Creatinine, Ser: 1.73 mg/dL — ABNORMAL HIGH (ref 0.61–1.24)
GFR, Estimated: 40 mL/min — ABNORMAL LOW (ref >60)
Glucose, Bld: 113 mg/dL — ABNORMAL HIGH (ref 70–99)
Potassium: 3.4 mmol/L — ABNORMAL LOW (ref 3.5–5.1)
Sodium: 136 mmol/L (ref 135–145)

## 2021-04-12 MED ORDER — POTASSIUM CHLORIDE 20 MEQ PO PACK
40.0000 meq | PACK | Freq: Once | ORAL | Status: AC
  Administered 2021-04-12: 40 meq via ORAL
  Filled 2021-04-12: qty 2

## 2021-04-12 MED ORDER — FUROSEMIDE 40 MG PO TABS
40.0000 mg | ORAL_TABLET | Freq: Every day | ORAL | Status: DC
  Administered 2021-04-13 – 2021-04-14 (×2): 40 mg via ORAL
  Filled 2021-04-12 (×2): qty 1

## 2021-04-12 MED ORDER — FUROSEMIDE 10 MG/ML IJ SOLN: 40.0000 mg | Freq: Every day | INTRAMUSCULAR | Status: DC

## 2021-04-12 NOTE — Care Management Important Message (Signed)
Important Message  Patient Details IM Letter given to the Patient. Name: Earl Castillo MRN: [id number] Date of Birth: 02-09-1941   Medicare Important Message Given:  Yes     Kerin Salen 04/12/2021, 10:01 AM

## 2021-04-12 NOTE — TOC Initial Note (Signed)
Transition of Care [facility]) - Initial/Assessment Note    Patient Details  Name: Earl Castillo MRN: [id number] Date of Birth: Mar 18, 1941  [facility] CM/SW Contact:    Ross Ludwig, LCSW Phone Number: 04/12/2021, 2:19 PM  Clinical Narrative:                  Patient is a 80 year male who lives alone alert and oriented x4.  Patient currently active with home health through [facility].  Patient would like to return back home with home health services and continue with his current agency.  Patient stated he did not need any other equipment at home.  CSW to continue to follow throughout discharge planning.  Expected Discharge Plan: [place] Barriers to Discharge: Continued Medical Work up   Patient Goals and CMS Choice Patient states their goals for this hospitalization and ongoing recovery are:: Plan to return back home with resumption of home health services. CMS Medicare.gov Compare Post Acute Care list provided to:: Patient Choice offered to / list presented to : Patient  Expected Discharge Plan and Services Expected Discharge Plan: [place] Choice: [place] arrangements for the past 2 months: [place]: PT HH Agency: [facility] ([place]) Date HH Agency Contacted: 04/12/21 Time [place]: 1233 Representative spoke with at [place]: Ramond Marrow  Prior Living Arrangements/Services Living arrangements for the past 2 months: [place] with:: Self Patient language and need for interpreter reviewed:: Yes Do you feel safe going back to the place where you live?: Yes      Need for Family Participation in Patient Care: No (Comment) Care giver support system in place?: Yes (comment) Current home services: Home PT Criminal Activity/Legal Involvement Pertinent to Current Situation/Hospitalization: No - Comment as  needed  Activities of Daily Living Home Assistive Devices/Equipment: Environmental consultant (specify type), Eyeglasses, Cane (specify quad or straight), Shower chair with back ADL Screening (condition at time of admission) Patient's cognitive ability adequate to safely complete daily activities?: Yes Is the patient deaf or have difficulty hearing?: Yes Does the patient have difficulty seeing, even when wearing glasses/contacts?: Yes (hx stroke in left eye 3 months ago per pt) Does the patient have difficulty concentrating, remembering, or making decisions?: Yes Patient able to express need for assistance with ADLs?: Yes Does the patient have difficulty dressing or bathing?: Yes Independently performs ADLs?: Yes (appropriate for developmental age) (uses walker) Does the patient have difficulty walking or climbing stairs?: Yes Weakness of Legs: None Weakness of Arms/Hands: None  Permission Sought/Granted Permission sought to share information with : Family Supports, Case Manager Permission granted to share information with : Yes, Release of Information Signed  Share Information with NAME: Lovell, [place] [phone]  [phone]  Marsalis, [place] Brother   [place] Son   [phone]  Marcy Panning Niece   [phone]  Permission granted to share info w AGENCY: [facility] admissions worker        Emotional Assessment Appearance:: Appears stated age   Affect (typically observed): Accepting, Appropriate, Calm, Stable Orientation: : Oriented to Self, Oriented to Place, Oriented to  Time, Oriented to Situation Alcohol / Substance Use: Not Applicable Psych Involvement: No (comment)  Admission diagnosis:  CHF (congestive heart failure) (  [place]) [I50.9] Community acquired pneumonia [J18.9] Acute CHF (congestive heart failure) ([place]) [I50.9] Generalized weakness [R53.1] Community acquired pneumonia, unspecified laterality [J18.9] Congestive heart failure, unspecified HF chronicity,  unspecified heart failure type ([place]) [I50.9] Patient Active Problem List   Diagnosis Date Noted   Protein-calorie malnutrition, severe 04/11/2021   Acute CHF (congestive heart failure) ([place]) 04/09/2021   Acute respiratory failure with hypoxia ([place]) 04/09/2021   NAION (non-arteritic anterior ischemic optic neuropathy), left 12/28/2020   Choroidal nevus, right 11/30/2020   PAD (peripheral artery disease) ([place]) 08/09/2016   GI bleed 06/22/2014   Current smoker 05/24/2014   Claudication ([place]) 05/23/2014   Vitamin B12 deficiency 11/23/2013   Angiodysplasia of intestine - small bowel  09/21/2013   PVD- Lt SFA PTA/stent 05/23/14 09/13/2013   Carotid artery disease ([place]) 09/13/2013   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

## 2021-04-12 NOTE — Progress Notes (Signed)
PROGRESS NOTE    [name]  IRS:[id number] DOB: 1941-03-10 DOA: 04/09/2021 PCP: [name], MD   Brief Narrative:  This 80 years old male with PMH significant for hypertension, hyperlipidemia, CAD s/p CABG, GERD, PAD s/p left right femoral-femoral bypass graft in 2000 and recent left retinal artery occlusion in February 2022, recurrent falls, carotid artery disease s/p recent large cerebral angiogram on 6/3 with Dr. [name] via left common femoral puncture.  He presented to the hospital because of multiple falls at home which had occurred for about 2 days prior to the admission.  Assessment & Plan:   Principal Problem:   Acute CHF (congestive heart failure) (HCC) Active Problems:   Essential hypertension   Coronary atherosclerosis   PAD (peripheral artery disease) (HCC)   Acute respiratory failure with hypoxia (HCC)   Protein-calorie malnutrition, severe   Acute hypoxic respiratory failure secondary to new diastolic CHF:  BNP was 009.3.  Continue IV Lasix.  Monitor BMP, daily weights and urine output. He is afebrile and has normal WBC count.  Doubt pneumonia at this time. Repeat chest x-ray done today shows improving aeration of the lungs and continued right lower lobe atelectasis/infiltrate.  Discontinue IV antibiotics for now.  Procalcitonin 0.10. Echocardiogram shows EF 60 to 65%.  No regional wall motion abnormalities.   Acute hypoxic respiratory failure:  > Resolved.  He is tolerating room air.   Recurrent falls at home: Continue OT/ PT,  recommended home with home health services.   CAD, PVD, symptomatic left carotid artery stenosis s/p recent R cerebral angiogram on 04/06/2021 with Dr. [name]:  Continue aspirin, Plavix and statins.  No evidence of acute stroke on CT head.   Outpatient follow-up with vascular surgeon for planned complex, hybrid carotid revascularization.   Hypertension: Continue Home BP medications.  Monitor BP closely.   Acute kidney injury: This  could be secondary to IV diuretic therapy. Avoid nephrotoxic medications, change to po Lasix today Recheck labs tomorrow morning.   DVT prophylaxis: Heparin sq Code Status: DNR Family Communication: No family at bed side. Disposition Plan:    Status is: Inpatient  Remains inpatient appropriate because:Inpatient level of care appropriate due to severity of illness   Dispo: The patient is from: Home              Anticipated d/c is to: Home with HHA 6/10              Patient currently is not medically stable to d/c.   Difficult to place patient No   Consultants:   None  Procedures: None Antimicrobials:  Anti-infectives (From admission, onward)    Start     Dose/Rate Route Frequency Ordered Stop   04/10/21 1400  cefTRIAXone (ROCEPHIN) 1 g in sodium chloride 0.9 % 100 mL IVPB        1 g 200 mL/hr over 30 Minutes Intravenous Every 24 hours 04/09/21 1610     04/10/21 1400  azithromycin (ZITHROMAX) 500 mg in sodium chloride 0.9 % 250 mL IVPB  Status:  Discontinued        500 mg 250 mL/hr over 60 Minutes Intravenous Every 24 hours 04/09/21 1610 04/10/21 1102   04/10/21 1400  azithromycin (ZITHROMAX) tablet 500 mg        500 mg Oral Daily 04/10/21 1102     04/09/21 1527  ceFAZolin (ANCEF) IVPB 2g/100 mL premix  Status:  Discontinued        2 g 200 mL/hr over 30 Minutes Intravenous  30 min pre-op 04/09/21 1527 04/09/21 1609   04/09/21 1345  cefTRIAXone (ROCEPHIN) 1 g in sodium chloride 0.9 % 100 mL IVPB        1 g 200 mL/hr over 30 Minutes Intravenous  Once 04/09/21 1332 04/09/21 1430   04/09/21 1345  azithromycin (ZITHROMAX) 500 mg in sodium chloride 0.9 % 250 mL IVPB        500 mg 250 mL/hr over 60 Minutes Intravenous  Once 04/09/21 1332 04/09/21 1535      Subjective: Patient was seen and examined at bedside.  Overnight events noted.  Patient denies any chest pain, shortness of breath, leg edema has resolved.  Feels better, denies any other concerns.  Objective: Vitals:    04/11/21 2040 04/12/21 0557 04/12/21 0819 04/12/21 1324  BP: (!) 129/50 (!) 138/54 (!) 123/52 (!) 118/50  Pulse: 65 66 71 70  Resp: 20   18  Temp: 97.9 F (36.6 C) 98.3 F (36.8 C)  98 F (36.7 C)  TempSrc: Oral Oral  Axillary  SpO2: 93% 92%  97%  Weight:  52.8 kg    Height:        Intake/Output Summary (Last 24 hours) at 04/12/2021 1420 Last data filed at 04/12/2021 0558 Gross per 24 hour  Intake 360 ml  Output 950 ml  Net -590 ml   Filed Weights   04/09/21 0945 04/11/21 0500 04/12/21 0557  Weight: 59 kg 52.8 kg 52.8 kg    Examination:  General exam: Appears calm and comfortable , not in any acute distress. Respiratory system: Clear to auscultation. Respiratory effort normal. Cardiovascular system: S1 & S2 heard, RRR. No JVD, murmurs, rubs, gallops or clicks. No pedal edema. Gastrointestinal system: Abdomen is nondistended, soft and nontender. No organomegaly or masses felt. Normal bowel sounds heard. Central nervous system: Alert and oriented x 2. No focal neurological deficits. Extremities: Symmetric 5 x 5 power.  No edema, no cyanosis, no clubbing. Skin: No rashes, lesions or ulcers Psychiatry: Judgement and insight appear normal. Mood & affect appropriate.     Data Reviewed: I have personally reviewed following labs and imaging studies  CBC: Recent Labs  Lab 04/09/21 1102 04/10/21 0230 04/10/21 1509 04/11/21 0525 04/12/21 0510  WBC 7.4 7.8 8.1 9.0 8.5  NEUTROABS 5.9  --   --   --   --   HGB 10.7* 11.5* 11.4* 11.3* 11.5*  HCT 34.3* 35.1* 35.1* 35.5* 35.5*  MCV 104.9* 101.7* 102.0* 102.3* 100.6*  PLT 201 223 242 249 951   Basic Metabolic Panel: Recent Labs  Lab 04/09/21 1102 04/10/21 0230 04/10/21 1509 04/11/21 0525 04/12/21 0510  NA 141 137 138 138 136  K 3.5 3.9 3.4* 3.7 3.4*  CL 107 97* 98 97* 94*  CO2 27 31 34* 31 33*  GLUCOSE 116* 101* 145* 116* 113*  BUN 15 19 23  32* 38*  CREATININE 0.86 1.14 1.56* 1.53* 1.73*  CALCIUM 8.5* 8.5* 8.4* 8.5*  8.5*  MG  --   --   --  2.1  --    GFR: Estimated Creatinine Clearance: 25.9 mL/min (A) (by C-G formula based on SCr of 1.73 mg/dL (H)). Liver Function Tests: Recent Labs  Lab 04/09/21 1102 04/10/21 0230  AST 15 28  ALT 10 9  ALKPHOS 51 47  BILITOT 0.6 1.6*  PROT 6.5 6.8  ALBUMIN 3.0* 2.9*   No results for input(s): LIPASE, AMYLASE in the last 168 hours. No results for input(s): AMMONIA in the last 168 hours. Coagulation Profile:  Recent Labs  Lab 04/10/21 0230  INR 1.1   Cardiac Enzymes: No results for input(s): CKTOTAL, CKMB, CKMBINDEX, TROPONINI in the last 168 hours. BNP (last 3 results) No results for input(s): PROBNP in the last 8760 hours. HbA1C: No results for input(s): HGBA1C in the last 72 hours. CBG: No results for input(s): GLUCAP in the last 168 hours. Lipid Profile: No results for input(s): CHOL, HDL, LDLCALC, TRIG, CHOLHDL, LDLDIRECT in the last 72 hours. Thyroid Function Tests: No results for input(s): TSH, T4TOTAL, FREET4, T3FREE, THYROIDAB in the last 72 hours. Anemia Panel: Recent Labs    04/10/21 1509  VITAMINB12 793   Sepsis Labs: Recent Labs  Lab 04/10/21 0230 04/11/21 0525  PROCALCITON <0.10 <0.10    Recent Results (from the past 240 hour(s))  SARS CORONAVIRUS 2 (TAT 6-24 HRS) Nasopharyngeal Nasopharyngeal Swab     Status: None   Collection Time: 04/03/21 12:27 PM   Specimen: Nasopharyngeal Swab  Result Value Ref Range Status   SARS Coronavirus 2 NEGATIVE NEGATIVE Final    Comment: (NOTE) SARS-CoV-2 target nucleic acids are NOT DETECTED.  The SARS-CoV-2 RNA is generally detectable in upper and lower respiratory specimens during the acute phase of infection. Negative results do not preclude SARS-CoV-2 infection, do not rule out co-infections with other pathogens, and should not be used as the sole basis for treatment or other patient management decisions. Negative results must be combined with clinical observations, patient  history, and epidemiological information. The expected result is Negative.  Fact Sheet for Patients: SugarRoll.be  Fact Sheet for Healthcare Providers: https://www.woods-mathews.com/  This test is not yet approved or cleared by the [place] FDA and  has been authorized for detection and/or diagnosis of SARS-CoV-2 by FDA under an Emergency Use Authorization (EUA). This EUA will remain  in effect (meaning this test can be used) for the duration of the COVID-19 declaration under Se ction 564(b)(1) of the Act, 21 U.S.C. section 360bbb-3(b)(1), unless the authorization is terminated or revoked sooner.  Performed at [facility], [place] [address], [place], [place] [address]   Resp Panel by RT-PCR (Flu A&B, Covid) Nasopharyngeal Swab     Status: None   Collection Time: 04/09/21 11:02 AM   Specimen: Nasopharyngeal Swab; Nasopharyngeal(NP) swabs in vial transport medium  Result Value Ref Range Status   SARS Coronavirus 2 by RT PCR NEGATIVE NEGATIVE Final    Comment: (NOTE) SARS-CoV-2 target nucleic acids are NOT DETECTED.  The SARS-CoV-2 RNA is generally detectable in upper respiratory specimens during the acute phase of infection. The lowest concentration of SARS-CoV-2 viral copies this assay can detect is 138 copies/mL. A negative result does not preclude SARS-Cov-2 infection and should not be used as the sole basis for treatment or other patient management decisions. A negative result may occur with  improper specimen collection/handling, submission of specimen other than nasopharyngeal swab, presence of viral mutation(s) within the areas targeted by this assay, and inadequate number of viral copies(<138 copies/mL). A negative result must be combined with clinical observations, patient history, and epidemiological information. The expected result is Negative.  Fact Sheet for Patients:   EntrepreneurPulse.com.au  Fact Sheet for Healthcare Providers:  IncredibleEmployment.be  This test is no t yet approved or cleared by the [place] FDA and  has been authorized for detection and/or diagnosis of SARS-CoV-2 by FDA under an Emergency Use Authorization (EUA). This EUA will remain  in effect (meaning this test can be used) for the duration of the COVID-19 declaration under  Section 564(b)(1) of the Act, 21 U.S.C.section 360bbb-3(b)(1), unless the authorization is terminated  or revoked sooner.       Influenza A by PCR NEGATIVE NEGATIVE Final   Influenza B by PCR NEGATIVE NEGATIVE Final    Comment: (NOTE) The Xpert Xpress SARS-CoV-2/FLU/RSV plus assay is intended as an aid in the diagnosis of influenza from Nasopharyngeal swab specimens and should not be used as a sole basis for treatment. Nasal washings and aspirates are unacceptable for Xpert Xpress SARS-CoV-2/FLU/RSV testing.  Fact Sheet for Patients: EntrepreneurPulse.com.au  Fact Sheet for Healthcare Providers: IncredibleEmployment.be  This test is not yet approved or cleared by the [place] FDA and has been authorized for detection and/or diagnosis of SARS-CoV-2 by FDA under an Emergency Use Authorization (EUA). This EUA will remain in effect (meaning this test can be used) for the duration of the COVID-19 declaration under Section 564(b)(1) of the Act, 21 U.S.C. section 360bbb-3(b)(1), unless the authorization is terminated or revoked.  Performed at [facility], [place] [address], [place], [place] [address]     Radiology Studies: DG CHEST PORT 1 VIEW  Result Date: 04/12/2021 CLINICAL DATA:  History of acute congestive heart failure, weakness, cough, shortness of breath. History of pneumonia EXAM: PORTABLE CHEST 1 VIEW.  Patient is rotated. COMPARISON:  Chest x-ray 04/10/2021, CT chest 04/11/2021 FINDINGS: The  heart size and mediastinal contours are unchanged. Aortic arch calcification. The large main pulmonary artery. Biapical pleural/pulmonary scarring. No pulmonary edema. No pneumothorax. Persistent trace to small volume right and trace left pleural effusions. No acute osseous abnormality. IMPRESSION: 1. Persistent trace to small volume right and trace left pleural effusions. 2. Pulmonary hypertension. Electronically Signed   By: Iven Finn M.D.   On: 04/12/2021 05:39   ECHOCARDIOGRAM COMPLETE  Result Date: 04/10/2021    ECHOCARDIOGRAM REPORT   Patient Name:   Earl Castillo Date of Exam: 04/10/2021 Medical Rec #:  [id number]       Height:       66.0 in Accession #:    [id number]      Weight:       130.1 lb Date of Birth:  [birth year]        BSA:          1.666 m Patient Age:    27 years        BP:           176/60 mmHg Patient Gender: M               HR:           51 bpm. Exam Location:  Inpatient Procedure: 2D Echo, Color Doppler and Cardiac Doppler Indications:    Congestive Heart Failure I50.9  History:        Patient has prior history of Echocardiogram examinations, most                 recent 08/02/2013. CAD, Stroke; Risk Factors:Hypertension and                 Dyslipidemia.  Sonographer:    Bernadene Person RDCS Referring Phys: [id number] [place]  1. Left ventricular ejection fraction, by estimation, is 65 to 70%. The left ventricle has normal function. The left ventricle has no regional wall motion abnormalities. Left ventricular diastolic parameters were normal.  2. Right ventricular systolic function is normal. The right ventricular size is normal. There is normal pulmonary artery systolic pressure.  3. The mitral valve is  normal in structure. No evidence of mitral valve regurgitation. No evidence of mitral stenosis.  4. The aortic valve is normal in structure. Aortic valve regurgitation is not visualized. Mild aortic valve sclerosis is present, with no evidence of aortic valve stenosis.  5.  The inferior vena cava is normal in size with greater than 50% respiratory variability, suggesting right atrial pressure of 3 mmHg. FINDINGS  Left Ventricle: Left ventricular ejection fraction, by estimation, is 65 to 70%. The left ventricle has normal function. The left ventricle has no regional wall motion abnormalities. The left ventricular internal cavity size was normal in size. There is  no left ventricular hypertrophy. Left ventricular diastolic parameters were normal. Normal left ventricular filling pressure. Right Ventricle: The right ventricular size is normal. No increase in right ventricular wall thickness. Right ventricular systolic function is normal. There is normal pulmonary artery systolic pressure. The tricuspid regurgitant velocity is 2.63 m/s, and  with an assumed right atrial pressure of 3 mmHg, the estimated right ventricular systolic pressure is 56.3 mmHg. Left Atrium: Left atrial size was normal in size. Right Atrium: Right atrial size was normal in size. Pericardium: There is no evidence of pericardial effusion. Mitral Valve: The mitral valve is normal in structure. No evidence of mitral valve regurgitation. No evidence of mitral valve stenosis. Tricuspid Valve: The tricuspid valve is normal in structure. Tricuspid valve regurgitation is trivial. No evidence of tricuspid stenosis. Aortic Valve: The aortic valve is normal in structure. Aortic valve regurgitation is not visualized. Mild aortic valve sclerosis is present, with no evidence of aortic valve stenosis. Pulmonic Valve: The pulmonic valve was normal in structure. Pulmonic valve regurgitation is not visualized. No evidence of pulmonic stenosis. Aorta: The aortic root is normal in size and structure. Venous: The inferior vena cava is normal in size with greater than 50% respiratory variability, suggesting right atrial pressure of 3 mmHg. IAS/Shunts: No atrial level shunt detected by color flow Doppler.  LEFT VENTRICLE PLAX 2D LVIDd:          4.20 cm  Diastology LVIDs:         2.90 cm  LV e' medial:    4.95 cm/s LV PW:         1.00 cm  LV E/e' medial:  12.5 LV IVS:        0.90 cm  LV e' lateral:   6.98 cm/s LVOT diam:     2.10 cm  LV E/e' lateral: 8.9 LV SV:         53 LV SV Index:   32 LVOT Area:     3.46 cm  RIGHT VENTRICLE RV S prime:     7.60 cm/s TAPSE (M-mode): 1.2 cm LEFT ATRIUM             Index       RIGHT ATRIUM          Index LA diam:        2.60 cm 1.56 cm/m  RA Area:     6.95 cm LA Vol (A2C):   44.2 ml 26.54 ml/m RA Volume:   11.80 ml 7.08 ml/m LA Vol (A4C):   48.9 ml 29.36 ml/m LA Biplane Vol: 47.4 ml 28.46 ml/m  AORTIC VALVE LVOT Vmax:   76.00 cm/s LVOT Vmean:  43.700 cm/s LVOT VTI:    0.152 m  AORTA Ao Root diam: 3.50 cm Ao Asc diam:  3.40 cm MITRAL VALVE  TRICUSPID VALVE MV Area (PHT): 2.91 cm    TR Peak grad:   27.7 mmHg MV Decel Time: 261 msec    TR Vmax:        263.00 cm/s MV E velocity: 61.80 cm/s MV A velocity: 57.60 cm/s  SHUNTS MV E/A ratio:  1.07        Systemic VTI:  0.15 m                            Systemic Diam: 2.10 cm Mihai Croitoru MD Electronically signed by Sanda Klein MD Signature Date/Time: 04/10/2021/2:38:52 PM    Final     Scheduled Meds:  aspirin EC  81 mg Oral QPM   azithromycin  500 mg Oral Daily   clopidogrel  75 mg Oral q AM   ferrous gluconate  324 mg Oral Q breakfast   [START ON 04/13/2021] furosemide  40 mg Intravenous Daily   heparin  5,000 Units Subcutaneous Q8H   losartan  100 mg Oral Daily   And   hydrochlorothiazide  25 mg Oral Daily   isosorbide mononitrate  30 mg Oral q AM   metoprolol tartrate  50 mg Oral q AM   NIFEdipine  90 mg Oral q AM   simvastatin  20 mg Oral QHS   sodium chloride flush  3 mL Intravenous Q12H   vitamin B-12  500 mcg Oral q AM   Continuous Infusions:  sodium chloride     cefTRIAXone (ROCEPHIN)  IV 1 g (04/11/21 1440)     LOS: 3 days    Time spent: 25 mins    Kamora Vossler, MD [facility]   If 7PM-7AM, please  contact night-coverage

## 2021-04-13 ENCOUNTER — Telehealth: Payer: Self-pay | Admitting: Cardiovascular Disease

## 2021-04-13 LAB — CBC
HCT: 36 % — ABNORMAL LOW (ref 39.0–52.0)
Hemoglobin: 11.3 g/dL — ABNORMAL LOW (ref 13.0–17.0)
MCH: 32.8 pg (ref 26.0–34.0)
MCHC: 31.4 g/dL (ref 30.0–36.0)
MCV: 104.7 fL — ABNORMAL HIGH (ref 80.0–100.0)
Platelets: 244 10*3/uL (ref 150–400)
RBC: 3.44 MIL/uL — ABNORMAL LOW (ref 4.22–5.81)
RDW: 12.9 % (ref 11.5–15.5)
WBC: 9 10*3/uL (ref 4.0–10.5)
nRBC: 0 % (ref 0.0–0.2)

## 2021-04-13 LAB — BASIC METABOLIC PANEL
Anion gap: 9 (ref 5–15)
BUN: 37 mg/dL — ABNORMAL HIGH (ref 8–23)
CO2: 31 mmol/L (ref 22–32)
Calcium: 8.4 mg/dL — ABNORMAL LOW (ref 8.9–10.3)
Chloride: 97 mmol/L — ABNORMAL LOW (ref 98–111)
Creatinine, Ser: 1.5 mg/dL — ABNORMAL HIGH (ref 0.61–1.24)
GFR, Estimated: 47 mL/min — ABNORMAL LOW (ref >60)
Glucose, Bld: 106 mg/dL — ABNORMAL HIGH (ref 70–99)
Potassium: 4 mmol/L (ref 3.5–5.1)
Sodium: 137 mmol/L (ref 135–145)

## 2021-04-13 LAB — BRAIN NATRIURETIC PEPTIDE: B Natriuretic Peptide: 32.5 pg/mL (ref 0.0–100.0)

## 2021-04-13 NOTE — Discharge Instructions (Addendum)
Advised to follow-up with primary care physician in 1 week. Advised to follow-up with cardiology as scheduled. Patient has completed antibiotic treatment for community-acquired pneumonia. Advised to follow-up with vascular surgeon as scheduled. Advised to hold losartan until renal functions improves.

## 2021-04-13 NOTE — TOC Progression Note (Addendum)
Transition of Care [facility]) - Progression Note    Patient Details  Name: Earl Castillo MRN: [id number] Date of Birth: Dec 03, 1940  Transition of [facility] CM/SW Contact  Ross Ludwig, [place] Phone Number: 04/13/2021, 3:39 PM  Clinical Narrative:     CSW spoke to patient's son Earl Castillo, and they would like to go to SNF instead of home health.  Patient's son expressed concern that patient has some weakness, and probably should go to SNF again for a few days.  CSW explained how his insurance will pay for stay.  CSW informed patient's son that since patient was just recently discharged from SNF about a month ago, he has not had his wellness period of 60 days, so patient's days will not start over, but they will pick up at day 15.  CSW informed son that patient will be in copay days after day 21, however patient has a supplement plan to cover copays.   Patient was at [facility] for 14 days, and they would like to return if possible.  CSW explained CSW spoke to [facility], they will review patient's information and let CSW know if they can accept.   Expected Discharge Plan: [place] Barriers to Discharge: Barriers Resolved  Expected Discharge Plan and Services Expected Discharge Plan: [place] Choice: [place] arrangements for the past 2 months: Single Family Home Expected Discharge Date: 04/13/21                         HH Arranged: PT, RN [place] Agency: [place] ([place]) Date HH Agency Contacted: 04/12/21 Time HH Agency Contacted: 1430 Representative spoke with at [place]: [place] ([place]) Interventions    Readmission Risk Interventions No flowsheet data found.

## 2021-04-13 NOTE — Progress Notes (Signed)
Physical Therapy Treatment Patient Details Name: Earl Castillo MRN: [id number] DOB: 01-12-1941 Today's Date: 04/13/2021    History of Present Illness 80 year old male  who presented to the ED with multiple falls over the past 2 days. Dx of CHF. Pt with past medical history of hypertension, hyperlipidemia, CAD s/p CABG, GERD, PAD s/p left right femoral-femoral bypass graft in 2000 and recent left retinal artery occlusion in February 2022, recurrent falls, carotid artery disease s/p recent arch/cerebral angiogram on 6/3 with Dr. [name] via left common femoral puncture.    PT Comments    Pt limited with mobility today d/t dizziness. Dizzy in sitting and feeling as if he may "pass out". BP checked after return to supine ~ 1 min -->133/50. Similar episode on last PT visit. Pt is possibly orthostatic-?  Recommendations updated to SNF given pt's multiple falls, limited progression in acute setting and per RN discussion with son earlier (son reports they cannot provide needed supervision). Recommend SNF   Follow Up Recommendations  SNF     Equipment Recommendations  None recommended by PT    Recommendations for Other Services       Precautions / Restrictions Precautions Precautions: Fall Precaution Comments: multiple falls in past 6 months ("too many to count") Restrictions Weight Bearing Restrictions: No    Mobility  Bed Mobility   Bed Mobility: Supine to Sit;Sit to Supine     Supine to sit: Min guard;HOB elevated Sit to supine: Min assist   General bed mobility comments: incr time to come to sit, min/guard for safety and then pt with posterior LOB requiring mod assist to recover--c/o dizziness. pt stated he had to lie down or he felt he would "pass out".  LE assist needed to return to supine; pt able to scoot up in bed using bil LEs and bed in trendelenberg; BP 133/50 in supine    Transfers                 General transfer comment: unable d/t  dizziness  Ambulation/Gait             General Gait Details: unable d/t dizziness   Stairs             Wheelchair Mobility    Modified Rankin (Stroke Patients Only)       Balance Overall balance assessment: Needs assistance Sitting-balance support: Bilateral upper extremity supported;Feet supported Sitting balance-Leahy Scale: Poor Sitting balance - Comments: poor to zero, posterior LOB requiring heavy mod assist to return to midline, pt unable to maintain sitting d/t dizziness Postural control: Posterior lean                                  Cognition Arousal/Alertness: Awake/alert Behavior During Therapy: WFL for tasks assessed/performed Overall Cognitive Status: No family/caregiver present to determine baseline cognitive functioning                                 General Comments: pt with noted memory deficits, unable to recall if he ate lunch, states "I'm hungry". pt unable to recall his son being present earlier today      Exercises General Exercises - Lower Extremity Ankle Circles/Pumps: AROM;Both;10 reps;Supine Heel Slides: AROM;Both;5 reps    General Comments        Pertinent Vitals/Pain Pain Assessment: No/denies pain    Home Living  Prior Function            PT Goals (current goals can now be found in the care plan section) Acute Rehab PT Goals Patient Stated Goal: stop falling and get stronger PT Goal Formulation: With patient Time For Goal Achievement: 04/20/21 Potential to Achieve Goals: Good Progress towards PT goals: Progressing toward goals    Frequency    Min 2X/week      PT Plan Discharge plan needs to be updated;Frequency needs to be updated    Co-evaluation              AM-PAC PT "6 Clicks" Mobility   Outcome Measure  Help needed turning from your back to your side while in a flat bed without using bedrails?: A Little Help needed moving from lying  on your back to sitting on the side of a flat bed without using bedrails?: A Little Help needed moving to and from a bed to a chair (including a wheelchair)?: Total Help needed standing up from a chair using your arms (e.g., wheelchair or bedside chair)?: Total Help needed to walk in hospital room?: Total Help needed climbing 3-5 steps with a railing? : Total 6 Click Score: 10    End of Session   Activity Tolerance: Treatment limited secondary to medical complications (Comment) (dizziness -Orthostatic?) Patient left: in bed;with call bell/phone within reach;with bed alarm set Nurse Communication: Mobility status PT Visit Diagnosis: Muscle weakness (generalized) (M62.81);Difficulty in walking, not elsewhere classified (R26.2);Unsteadiness on feet (R26.81)     Time: 8250-0370 PT Time Calculation (min) (ACUTE ONLY): 21 min  Charges:  $Therapeutic Activity: 8-22 mins                     Baxter Flattery, PT  Acute Rehab Dept [facility]) [phone] Pager [phone]  04/13/2021    [facility] 04/13/2021, 4:32 PM

## 2021-04-13 NOTE — Progress Notes (Signed)
PROGRESS NOTE    Earl Castillo DOB: 1941-04-21 DOA: 04/09/2021 PCP: [name], MD   Brief Narrative:  This 80 years old male with PMH significant for hypertension, hyperlipidemia, CAD s/p CABG, GERD, PAD s/p left right femoral-femoral bypass graft in 2000 and recent left retinal artery occlusion in February 2022, recurrent falls, carotid artery disease s/p recent large cerebral angiogram on 6/3 with Dr. [name] via left common femoral puncture.  He presented to the hospital because of multiple falls at home which had occurred for about 2 days prior to the admission.  Assessment & Plan:   Principal Problem:   Acute CHF (congestive heart failure) (HCC) Active Problems:   Essential hypertension   Coronary atherosclerosis   PAD (peripheral artery disease) (HCC)   Acute respiratory failure with hypoxia (HCC)   Protein-calorie malnutrition, severe   Acute hypoxic respiratory failure secondary to new diastolic CHF:  BNP was 258.5.  Continue IV Lasix.  Monitor BMP, daily weights and urine output. He is afebrile and has normal WBC count.  Doubt pneumonia at this time. Repeat chest x-ray done today shows improving aeration of the lungs and continued right lower lobe atelectasis/infiltrate.  Discontinue IV antibiotics for now.  Procalcitonin 0.10. Echocardiogram shows EF 60 to 65%.  No regional wall motion abnormalities.   Acute hypoxic respiratory failure:  > Resolved.  He is tolerating room air.   Recurrent falls at home: Continue OT/ PT,  recommended home with home health services. Patient is unsteady while standing.  Family requested PT and OT reassessment.   CAD, PVD, symptomatic left carotid artery stenosis s/p recent R cerebral angiogram on 04/06/2021 with Dr. [name]:  Continue aspirin, Plavix and statins.  No evidence of acute stroke on CT head.   Outpatient follow-up with vascular surgeon for planned complex, hybrid carotid revascularization.   Hypertension:  Continue Home BP medications.  Monitor BP closely.   Acute kidney injury: > Improving This could be secondary to IV diuretic therapy. Avoid nephrotoxic medications, change to po Lasix today Recheck labs tomorrow morning.   DVT prophylaxis: Heparin sq Code Status: DNR Family Communication: No family at bed side. Disposition Plan:    Status is: Inpatient  Remains inpatient appropriate because:Inpatient level of care appropriate due to severity of illness   Dispo: The patient is from: Home              Anticipated d/c is to: Home with [facility] [place] 6/11              Patient currently is not medically stable to d/c.   Difficult to place patient No   Consultants:   None  Procedures: None Antimicrobials:  Anti-infectives (From admission, onward)    Start     Dose/Rate Route Frequency Ordered Stop   04/10/21 1400  cefTRIAXone (ROCEPHIN) 1 g in sodium chloride 0.9 % 100 mL IVPB        1 g 200 mL/hr over 30 Minutes Intravenous Every 24 hours 04/09/21 1610     04/10/21 1400  azithromycin (ZITHROMAX) 500 mg in sodium chloride 0.9 % 250 mL IVPB  Status:  Discontinued        500 mg 250 mL/hr over 60 Minutes Intravenous Every 24 hours 04/09/21 1610 04/10/21 1102   04/10/21 1400  azithromycin (ZITHROMAX) tablet 500 mg        500 mg Oral Daily 04/10/21 1102     04/09/21 1527  ceFAZolin (ANCEF) IVPB 2g/100 mL premix  Status:  Discontinued  2 g 200 mL/hr over 30 Minutes Intravenous 30 min pre-op 04/09/21 1527 04/09/21 1609   04/09/21 1345  cefTRIAXone (ROCEPHIN) 1 g in sodium chloride 0.9 % 100 mL IVPB        1 g 200 mL/hr over 30 Minutes Intravenous  Once 04/09/21 1332 04/09/21 1430   04/09/21 1345  azithromycin (ZITHROMAX) 500 mg in sodium chloride 0.9 % 250 mL IVPB        500 mg 250 mL/hr over 60 Minutes Intravenous  Once 04/09/21 1332 04/09/21 1535      Subjective: Patient was seen and examined at bedside.  Overnight events noted.   Patient denies any chest pain, shortness of  breath, leg edema has resolved.   Patient feels unsteady while standing.  Family has requested PT and OT reassessment.  Objective: Vitals:   04/12/21 1324 04/12/21 2020 04/13/21 0500 04/13/21 0513  BP: (!) 118/50 124/81  (!) 140/59  Pulse: 70 72  73  Resp: 18 20  20   Temp: 98 F (36.7 C) 98.9 F (37.2 C)  98.4 F (36.9 C)  TempSrc: Axillary Oral  Oral  SpO2: 97% 96%  94%  Weight:   53 kg   Height:        Intake/Output Summary (Last 24 hours) at 04/13/2021 1312 Last data filed at 04/13/2021 0514 Gross per 24 hour  Intake --  Output 1200 ml  Net -1200 ml    Filed Weights   04/11/21 0500 04/12/21 0557 04/13/21 0500  Weight: 52.8 kg 52.8 kg 53 kg    Examination:  General exam: Appears calm and comfortable , not in any acute distress. Respiratory system: Clear to auscultation. Respiratory effort normal. Cardiovascular system: S1 & S2 heard, RRR. No JVD, murmurs, rubs, gallops or clicks. No pedal edema. Gastrointestinal system: Abdomen is nondistended, soft and nontender. No organomegaly or masses felt. Normal bowel sounds heard. Central nervous system: Alert and oriented x 2. No focal neurological deficits. Extremities: Symmetric 5 x 5 power.  No edema, no cyanosis, no clubbing. Skin: No rashes, lesions or ulcers Psychiatry: Judgement and insight appear normal. Mood & affect appropriate.     Data Reviewed: I have personally reviewed following labs and imaging studies  CBC: Recent Labs  Lab 04/09/21 1102 04/10/21 0230 04/10/21 1509 04/11/21 0525 04/12/21 0510 04/13/21 0456  WBC 7.4 7.8 8.1 9.0 8.5 9.0  NEUTROABS 5.9  --   --   --   --   --   HGB 10.7* 11.5* 11.4* 11.3* 11.5* 11.3*  HCT 34.3* 35.1* 35.1* 35.5* 35.5* 36.0*  MCV 104.9* 101.7* 102.0* 102.3* 100.6* 104.7*  PLT 201 223 242 249 250 563    Basic Metabolic Panel: Recent Labs  Lab 04/10/21 0230 04/10/21 1509 04/11/21 0525 04/12/21 0510 04/13/21 0456  NA 137 138 138 136 137  K 3.9 3.4* 3.7 3.4*  4.0  CL 97* 98 97* 94* 97*  CO2 31 34* 31 33* 31  GLUCOSE 101* 145* 116* 113* 106*  BUN 19 23 32* 38* 37*  CREATININE 1.14 1.56* 1.53* 1.73* 1.50*  CALCIUM 8.5* 8.4* 8.5* 8.5* 8.4*  MG  --   --  2.1  --   --     GFR: Estimated Creatinine Clearance: 29.9 mL/min (A) (by C-G formula based on SCr of 1.5 mg/dL (H)). Liver Function Tests: Recent Labs  Lab 04/09/21 1102 04/10/21 0230  AST 15 28  ALT 10 9  ALKPHOS 51 47  BILITOT 0.6 1.6*  PROT 6.5 6.8  ALBUMIN  3.0* 2.9*    No results for input(s): LIPASE, AMYLASE in the last 168 hours. No results for input(s): AMMONIA in the last 168 hours. Coagulation Profile: Recent Labs  Lab 04/10/21 0230  INR 1.1    Cardiac Enzymes: No results for input(s): CKTOTAL, CKMB, CKMBINDEX, TROPONINI in the last 168 hours. BNP (last 3 results) No results for input(s): PROBNP in the last 8760 hours. HbA1C: No results for input(s): HGBA1C in the last 72 hours. CBG: No results for input(s): GLUCAP in the last 168 hours. Lipid Profile: No results for input(s): CHOL, HDL, LDLCALC, TRIG, CHOLHDL, LDLDIRECT in the last 72 hours. Thyroid Function Tests: No results for input(s): TSH, T4TOTAL, FREET4, T3FREE, THYROIDAB in the last 72 hours. Anemia Panel: Recent Labs    04/10/21 1610  [id number]    Sepsis Labs: Recent Labs  Lab 04/10/21 0230 04/11/21 0525  PROCALCITON <0.10 <0.10     Recent Results (from the past 240 hour(s))  Resp Panel by RT-PCR (Flu A&B, Covid) Nasopharyngeal Swab     Status: None   Collection Time: 04/09/21 11:02 AM   Specimen: Nasopharyngeal Swab; Nasopharyngeal(NP) swabs in vial transport medium  Result Value Ref Range Status   SARS Coronavirus 2 by RT PCR NEGATIVE NEGATIVE Final    Comment: (NOTE) SARS-CoV-2 target nucleic acids are NOT DETECTED.  The SARS-CoV-2 RNA is generally detectable in upper respiratory specimens during the acute phase of infection. The lowest concentration of SARS-CoV-2 viral  copies this assay can detect is 138 copies/mL. A negative result does not preclude SARS-Cov-2 infection and should not be used as the sole basis for treatment or other patient management decisions. A negative result may occur with  improper specimen collection/handling, submission of specimen other than nasopharyngeal swab, presence of viral mutation(s) within the areas targeted by this assay, and inadequate number of viral copies(<138 copies/mL). A negative result must be combined with clinical observations, patient history, and epidemiological information. The expected result is Negative.  Fact Sheet for Patients:  EntrepreneurPulse.com.au  Fact Sheet for Healthcare Providers:  IncredibleEmployment.be  This test is no t yet approved or cleared by the [place] FDA and  has been authorized for detection and/or diagnosis of SARS-CoV-2 by FDA under an Emergency Use Authorization (EUA). This EUA will remain  in effect (meaning this test can be used) for the duration of the COVID-19 declaration under Section 564(b)(1) of the Act, 21 U.S.C.section 360bbb-3(b)(1), unless the authorization is terminated  or revoked sooner.       Influenza A by PCR NEGATIVE NEGATIVE Final   Influenza B by PCR NEGATIVE NEGATIVE Final    Comment: (NOTE) The Xpert Xpress SARS-CoV-2/FLU/RSV plus assay is intended as an aid in the diagnosis of influenza from Nasopharyngeal swab specimens and should not be used as a sole basis for treatment. Nasal washings and aspirates are unacceptable for Xpert Xpress SARS-CoV-2/FLU/RSV testing.  Fact Sheet for Patients: EntrepreneurPulse.com.au  Fact Sheet for Healthcare Providers: IncredibleEmployment.be  This test is not yet approved or cleared by the [place] FDA and has been authorized for detection and/or diagnosis of SARS-CoV-2 by FDA under an Emergency Use Authorization (EUA). This  EUA will remain in effect (meaning this test can be used) for the duration of the COVID-19 declaration under Section 564(b)(1) of the Act, 21 U.S.C. section 360bbb-3(b)(1), unless the authorization is terminated or revoked.  Performed at [facility], [place] [address] [place], [place] [address]     Radiology Studies: DG CHEST PORT 1  VIEW  Result Date: 04/12/2021 CLINICAL DATA:  History of acute congestive heart failure, weakness, cough, shortness of breath. History of pneumonia EXAM: PORTABLE CHEST 1 VIEW.  Patient is rotated. COMPARISON:  Chest x-ray 04/10/2021, CT chest 04/11/2021 FINDINGS: The heart size and mediastinal contours are unchanged. Aortic arch calcification. The large main pulmonary artery. Biapical pleural/pulmonary scarring. No pulmonary edema. No pneumothorax. Persistent trace to small volume right and trace left pleural effusions. No acute osseous abnormality. IMPRESSION: 1. Persistent trace to small volume right and trace left pleural effusions. 2. Pulmonary hypertension. Electronically Signed   By: Iven Finn M.D.   On: 04/12/2021 05:39    Scheduled Meds:  aspirin EC  81 mg Oral QPM   azithromycin  500 mg Oral Daily   clopidogrel  75 mg Oral q AM   ferrous gluconate  324 mg Oral Q breakfast   furosemide  40 mg Oral Daily   heparin  5,000 Units Subcutaneous Q8H   hydrochlorothiazide  25 mg Oral Daily   isosorbide mononitrate  30 mg Oral q AM   metoprolol tartrate  50 mg Oral q AM   NIFEdipine  90 mg Oral q AM   simvastatin  20 mg Oral QHS   sodium chloride flush  3 mL Intravenous Q12H   vitamin B-12  500 mcg Oral q AM   Continuous Infusions:  sodium chloride     cefTRIAXone (ROCEPHIN)  IV 1 g (04/12/21 1436)     LOS: 4 days    Time spent: 25 mins    Jamont Mellin, MD [facility]   If 7PM-7AM, please contact night-coverage

## 2021-04-13 NOTE — Telephone Encounter (Signed)
Son of the patient called. The son called and states that the patient is in the hospital and being treated for CHF. The Son would like to know more about the patient's treatment, as he has not gotten much information from the hospital staff other than when he came to visit the patient yesterday.  He would like to know what to do to make the best decisions to keep him safe following his hospital discharge.   Please assist

## 2021-04-13 NOTE — TOC Progression Note (Addendum)
Transition of Care [facility]) - Progression Note    Patient Details  Name: Earl Castillo MRN: [id number] Date of Birth: Apr 07, 1941  Transition of Care [facility]) CM/SW [place], LCSW Phone Number: 04/13/2021, 12:43 PM  Clinical Narrative:    [place] is not medically ready for discharge per MD. [place] has been set up through [place]. Updated [place] patient is not d/c today.Outpatient Palliative referral was made to [facility].  3:30 pm CSW spoke with patient and his son [name] [phone], they have decided to go to short term rehab. Patient has been fax out to SNFs awaiting bed offers. Patient and son prefers [facility], [facility], [place], [place]. CSW will continue to follow.  Expected Discharge Plan: [place] Barriers to Discharge: Barriers Resolved  Expected Discharge Plan and Services Expected Discharge Plan: [place] Choice: [place] arrangements for the past 2 months: Single Family Home Expected Discharge Date: 04/13/21                         HH Arranged: PT, RN [place] Agency: [place] ([place]) Date HH Agency Contacted: 04/12/21 Time HH Agency Contacted: 1430 Representative spoke with at [place]: [place] ([place]) Interventions    Readmission Risk Interventions No flowsheet data found.

## 2021-04-13 NOTE — Telephone Encounter (Signed)
This RN returned call, spoke to [facility], patient's son (ok per DPR). Pt's son reports the patient has been admitted to the hospital for congestive heart failure and wanted to make [name] aware. He reports the patient has also had increasing mobility issues. Pt's son wanted to be able to follow up about patient's care following his hospitalization, this RN was able to schedule pt an appointment with Roby Lofts, PA July 6 at 2:15pm. This RN also let pt's son know his message would be forwarded to [name] for review. Pt's son verbalized understanding.

## 2021-04-13 NOTE — NC FL2 (Signed)
[place] LEVEL OF CARE SCREENING TOOL     IDENTIFICATION  Patient Name: Earl Castillo Birthdate: Apr 03, 1941 Sex: male Admission Date (Current Location): 04/09/2021  [facility] and [place] Number:  Herbalist and Address:  [facility],  [place], [place]      Provider Number: [id number]  Attending Physician Name and Address:  Shawna Clamp, MD  Relative Name and Phone Number:  RJJOACZ,YSAY(TKZ)[phone]  Milstein,Mike(Brother)[phone]  Stmartin,Gregory(Son)[phone] Fulp,Gail(Niece)[phone]    Current Level of Care: Hospital Recommended Level of Care: [place] Prior Approval Number:    Date Approved/Denied:   PASRR Number: [id number]  Discharge Plan: SNF    Current Diagnoses: Patient Active Problem List   Diagnosis Date Noted   Protein-calorie malnutrition, severe 04/11/2021   Acute CHF (congestive heart failure) ([employer]) 04/09/2021   Acute respiratory failure with hypoxia ([place]) 04/09/2021   NAION (non-arteritic anterior ischemic optic neuropathy), left 12/28/2020   Choroidal nevus, right 11/30/2020   PAD (peripheral artery disease) ([place]) 08/09/2016   GI bleed 06/22/2014   Current smoker 05/24/2014   Claudication ([place]) 05/23/2014   Vitamin B12 deficiency 11/23/2013   Angiodysplasia of intestine - small bowel  09/21/2013   PVD- Lt SFA PTA/stent 05/23/14 09/13/2013   Carotid artery disease ([place]) 09/13/2013   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 08/17/2013   Personal history of colonic polyps-adenoma 08/17/2012   NEPHROLITHIASIS, HX OF 09/10/2010   URTICARIA 08/22/2010   Coronary atherosclerosis 01/13/2009   Osteoarthritis 01/13/2009   CHICKENPOX, HX OF 01/13/2008   Hyperlipidemia 07/29/2007   Essential hypertension 07/29/2007    Orientation RESPIRATION BLADDER Height & Weight     Self, Time, Situation, Place  Normal  Continent Weight: 116 lb 13.5 oz (53 kg) Height:  5\' 6"  (167.6 cm)  BEHAVIORAL SYMPTOMS/MOOD NEUROLOGICAL BOWEL NUTRITION STATUS      Continent Diet (Carb Mod low sodium)  AMBULATORY STATUS COMMUNICATION OF NEEDS Skin   Limited Assist Verbally Normal                       Personal Care Assistance Level of Assistance  Bathing, Feeding, Dressing Bathing Assistance: Limited assistance Feeding assistance: Independent Dressing Assistance: Limited assistance     Functional Limitations Info  Sight, Hearing, Speech Sight Info: Adequate Hearing Info: Adequate Speech Info: Adequate    SPECIAL CARE FACTORS FREQUENCY  PT (By licensed PT), OT (By licensed OT)     PT Frequency: 5 x a week OT Frequency: 5x a week            Contractures Contractures Info: Not present    Additional Factors Info  Code Status, Allergies Code Status Info: DNR Allergies Info: NKA           Current Medications (04/13/2021):  This is the current hospital active medication list Current Facility-Administered Medications  Medication Dose Route Frequency Provider Last Rate Last Admin   0.9 %  sodium chloride infusion   Intravenous Continuous Serafina Mitchell, MD       acetaminophen (TYLENOL) tablet 650 mg  650 mg Oral Q6H PRN Harold Hedge, MD       Or   acetaminophen (TYLENOL) suppository 650 mg  650 mg Rectal Q6H PRN Harold Hedge, MD       aspirin EC tablet 81 mg  81 mg Oral QPM Harold Hedge, MD   81 mg at 04/12/21 1701   clopidogrel (PLAVIX) tablet 75  mg  75 mg Oral q AM Harold Hedge, MD   75 mg at 04/13/21 1610   docusate sodium (COLACE) capsule 100 mg  100 mg Oral Daily PRN Harold Hedge, MD   100 mg at 04/11/21 0902   ferrous gluconate (FERGON) tablet 324 mg  324 mg Oral Q breakfast Harold Hedge, MD   324 mg at 04/13/21 0901   furosemide (LASIX) tablet 40 mg  40 mg Oral Daily Shawna Clamp, MD   40 mg at 04/13/21 0901   heparin injection 5,000 Units  5,000 Units Subcutaneous Q8H  Harold Hedge, MD   5,000 Units at 04/13/21 1422   hydrALAZINE (APRESOLINE) injection 10 mg  10 mg Intravenous Q8H PRN Harold Hedge, MD   10 mg at 04/10/21 0251   isosorbide mononitrate (IMDUR) 24 hr tablet 30 mg  30 mg Oral q AM Harold Hedge, MD   30 mg at 04/13/21 0903   metoprolol tartrate (LOPRESSOR) tablet 50 mg  50 mg Oral q AM Harold Hedge, MD   50 mg at 04/13/21 0904   NIFEdipine (PROCARDIA-XL/NIFEDICAL-XL) 24 hr tablet 90 mg  90 mg Oral q AM Harold Hedge, MD   90 mg at 04/13/21 0901   simvastatin (ZOCOR) tablet 20 mg  20 mg Oral QHS Harold Hedge, MD   20 mg at 04/12/21 2214   sodium chloride flush (NS) 0.9 % injection 3 mL  3 mL Intravenous Q12H Harold Hedge, MD   3 mL at 04/13/21 9604   vitamin B-12 (CYANOCOBALAMIN) tablet 500 mcg  500 mcg Oral q AM Harold Hedge, MD   500 mcg at 04/13/21 5409     Discharge Medications: Please see discharge summary for a list of discharge medications.  Relevant Imaging Results:  Relevant Lab Results:   Additional Information SSN [id number]  [place], LCSW

## 2021-04-13 NOTE — Progress Notes (Signed)
[facility] ([facility])  Hospital Liaison: RN note         This patient has been referred to our palliative care services in the community.  [facility] will continue to follow for any discharge planning needs and to coordinate continuation of palliative care in the outpatient setting.    If you have questions or need assistance, please call [phone] or contact the hospital Liaison listed on AMION.      Thank you for this referral.         Mary Anne Robertson, RN, CCM  [facility] Hospital Liaison   [phone] 

## 2021-04-14 LAB — BASIC METABOLIC PANEL
Anion gap: 9 (ref 5–15)
BUN: 41 mg/dL — ABNORMAL HIGH (ref 8–23)
CO2: 32 mmol/L (ref 22–32)
Calcium: 9.3 mg/dL (ref 8.9–10.3)
Chloride: 96 mmol/L — ABNORMAL LOW (ref 98–111)
Creatinine, Ser: 1.51 mg/dL — ABNORMAL HIGH (ref 0.61–1.24)
GFR, Estimated: 47 mL/min — ABNORMAL LOW (ref >60)
Glucose, Bld: 107 mg/dL — ABNORMAL HIGH (ref 70–99)
Potassium: 3.8 mmol/L (ref 3.5–5.1)
Sodium: 137 mmol/L (ref 135–145)

## 2021-04-14 LAB — CBC
HCT: 35.9 % — ABNORMAL LOW (ref 39.0–52.0)
Hemoglobin: 11.4 g/dL — ABNORMAL LOW (ref 13.0–17.0)
MCH: 32.9 pg (ref 26.0–34.0)
MCHC: 31.8 g/dL (ref 30.0–36.0)
MCV: 103.5 fL — ABNORMAL HIGH (ref 80.0–100.0)
Platelets: 281 10*3/uL (ref 150–400)
RBC: 3.47 MIL/uL — ABNORMAL LOW (ref 4.22–5.81)
RDW: 12.7 % (ref 11.5–15.5)
WBC: 8.3 10*3/uL (ref 4.0–10.5)
nRBC: 0 % (ref 0.0–0.2)

## 2021-04-14 LAB — RESP PANEL BY RT-PCR (FLU A&B, COVID) ARPGX2
Influenza A by PCR: NEGATIVE
Influenza B by PCR: NEGATIVE
SARS Coronavirus 2 by RT PCR: NEGATIVE

## 2021-04-14 LAB — PHOSPHORUS: Phosphorus: 4 mg/dL (ref 2.5–4.6)

## 2021-04-14 LAB — MAGNESIUM: Magnesium: 2.6 mg/dL — ABNORMAL HIGH (ref 1.7–2.4)

## 2021-04-14 NOTE — Plan of Care (Signed)
  Problem: Nutrition: Goal: Adequate nutrition will be maintained Outcome: Progressing   Problem: Pain Managment: Goal: General experience of comfort will improve Outcome: Progressing   

## 2021-04-14 NOTE — TOC Progression Note (Signed)
Transition of Care [facility]) - Progression Note    Patient Details  Name: Earl Castillo MRN: [id number] Date of Birth: 08-Nov-1940  Transition of Care [facility]) CM/SW [place], [place] Phone Number: 04/14/2021, 12:24 PM  Clinical Narrative:   Billie Ruddy at [facility] confirms they can take patient on Monday. TOC will continue to follow during the course of hospitalization.     Expected Discharge Plan: [place] Barriers to Discharge: Barriers Resolved  Expected Discharge Plan and Services Expected Discharge Plan: [place] Choice: [place] arrangements for the past 2 months: Single Family Home Expected Discharge Date: 04/13/21                         HH Arranged: PT, RN [place] Agency: [place] ([place]) Date HH Agency Contacted: 04/12/21 Time HH Agency Contacted: 1430 Representative spoke with at [place]: [place] ([place]) Interventions    Readmission Risk Interventions No flowsheet data found.

## 2021-04-14 NOTE — Progress Notes (Addendum)
PROGRESS NOTE    ODEL SCHMID  WIO:[id number] DOB: [birth year] DOA: 04/09/2021 PCP: [name], MD   Brief Narrative:  This 80 years old male with PMH significant for hypertension, hyperlipidemia, CAD s/p CABG, GERD, PAD s/p left right femoral-femoral bypass graft in 2000 and recent left retinal artery occlusion in February 2022, recurrent falls, carotid artery disease s/p recent large cerebral angiogram on 04/06/21 with Dr. [name] via left common femoral puncture.  He presented to the hospital because of multiple falls at home which had occurred for about 2 days prior to the admission.  Assessment & Plan:   Principal Problem:   Acute CHF (congestive heart failure) (HCC) Active Problems:   Essential hypertension   Coronary atherosclerosis   PAD (peripheral artery disease) (HCC)   Acute respiratory failure with hypoxia (HCC)   Protein-calorie malnutrition, severe   Acute hypoxic respiratory failure secondary to new diastolic CHF:  BNP was 384.5.  Continued IV Lasix.  Monitor BMP, daily weights and urine output. He is afebrile and has normal WBC count.  Doubt pneumonia at this time. Repeat chest x-ray done today shows improving aeration of the lungs and continued right lower lobe atelectasis/infiltrate.  Discontinue IV antibiotics for now.  Procalcitonin 0.10. Echocardiogram shows EF 60 to 65%.  No regional wall motion abnormalities. He is weaned down to room air. BNP 32.0. Lasix discontinued.   Acute hypoxic respiratory failure:  > Resolved.  He is tolerating room air.   Recurrent falls at home: Continue OT/ PT,  recommended home with home health services. Patient is unsteady while standing.  PT reassessment recommended skilled nursing facility. Son is agreeable to the SNF placement.  Awaiting authorization.   CAD, PVD, symptomatic left carotid artery stenosis s/p recent R cerebral angiogram on 04/06/2021 with Dr. [name]:  Continue aspirin, Plavix and statins.  No evidence of acute  stroke on CT head.   Outpatient follow-up with vascular surgeon for planned complex, hybrid carotid revascularization.   Hypertension: Continue Home BP medications.  Monitor BP closely.   Acute kidney injury: > Improving This could be secondary to IV diuretic therapy. Avoid nephrotoxic medications, Lasix discontinued. Recheck labs tomorrow morning.   DVT prophylaxis: Heparin sq Code Status: DNR Family Communication: Son Todd at bed side. Disposition Plan:    Status is: Inpatient  Remains inpatient appropriate because:Inpatient level of care appropriate due to severity of illness   Dispo: The patient is from: Home              Anticipated d/c is to: SNF in 1-2 days.              Patient currently is not medically stable to d/c.   Difficult to place patient No   Consultants:   None  Procedures: None Antimicrobials:  Anti-infectives (From admission, onward)    Start     Dose/Rate Route Frequency Ordered Stop   04/10/21 1400  cefTRIAXone (ROCEPHIN) 1 g in sodium chloride 0.9 % 100 mL IVPB  Status:  Discontinued        1 g 200 mL/hr over 30 Minutes Intravenous Every 24 hours 04/09/21 1610 04/13/21 1402   04/10/21 1400  azithromycin (ZITHROMAX) 500 mg in sodium chloride 0.9 % 250 mL IVPB  Status:  Discontinued        500 mg 250 mL/hr over 60 Minutes Intravenous Every 24 hours 04/09/21 1610 04/10/21 1102   04/10/21 1400  azithromycin (ZITHROMAX) tablet 500 mg  Status:  Discontinued  500 mg Oral Daily 04/10/21 1102 04/13/21 1402   04/09/21 1527  ceFAZolin (ANCEF) IVPB 2g/100 mL premix  Status:  Discontinued        2 g 200 mL/hr over 30 Minutes Intravenous 30 min pre-op 04/09/21 1527 04/09/21 1609   04/09/21 1345  cefTRIAXone (ROCEPHIN) 1 g in sodium chloride 0.9 % 100 mL IVPB        1 g 200 mL/hr over 30 Minutes Intravenous  Once 04/09/21 1332 04/09/21 1430   04/09/21 1345  azithromycin (ZITHROMAX) 500 mg in sodium chloride 0.9 % 250 mL IVPB        500 mg 250 mL/hr  over 60 Minutes Intravenous  Once 04/09/21 1332 04/09/21 1535      Subjective: Patient was seen and examined at bedside.  Overnight events noted.   Patient denies any chest pain, shortness of breath, leg edema has resolved.   Patient feels unsteady while standing.  Patient and family were agreeable to SNF placement.  Objective: Vitals:   04/13/21 2319 04/14/21 0500 04/14/21 0508 04/14/21 1252  BP: (!) 139/59  (!) 124/53 (!) 115/53  Pulse: 73  70 (!) 58  Resp: (!) 22  18 19   Temp: 98.3 F (36.8 C)  98.2 F (36.8 C) 98.4 F (36.9 C)  TempSrc: Oral  Oral   SpO2: 95%  95% 97%  Weight:  53.4 kg    Height:        Intake/Output Summary (Last 24 hours) at 04/14/2021 1420 Last data filed at 04/14/2021 0700 Gross per 24 hour  Intake --  Output 550 ml  Net -550 ml   Filed Weights   04/12/21 0557 04/13/21 0500 04/14/21 0500  Weight: 52.8 kg 53 kg 53.4 kg    Examination:  General exam: Appears calm and comfortable , not in any acute distress. Respiratory system: Clear to auscultation. Respiratory effort normal. Cardiovascular system: S1 & S2 heard, RRR. No JVD, murmurs, rubs, gallops or clicks. No pedal edema. Gastrointestinal system: Abdomen is nondistended, soft and nontender. No organomegaly or masses felt. Normal bowel sounds heard. Central nervous system: Alert and oriented x 2. No focal neurological deficits. Extremities:  No edema, no cyanosis, no clubbing. Skin: No rashes, lesions or ulcers Psychiatry: Judgement and insight appear normal. Mood & affect appropriate.     Data Reviewed: I have personally reviewed following labs and imaging studies  CBC: Recent Labs  Lab 04/09/21 1102 04/10/21 0230 04/10/21 1509 04/11/21 0525 04/12/21 0510 04/13/21 0456 04/14/21 0528  WBC 7.4   < > 8.1 9.0 8.5 9.0 8.3  NEUTROABS 5.9  --   --   --   --   --   --   HGB 10.7*   < > 11.4* 11.3* 11.5* 11.3* 11.4*  HCT 34.3*   < > 35.1* 35.5* 35.5* 36.0* 35.9*  MCV 104.9*   < > 102.0*  102.3* 100.6* 104.7* 103.5*  PLT 201   < > 242 249 250 244 281   < > = values in this interval not displayed.   Basic Metabolic Panel: Recent Labs  Lab 04/10/21 1509 04/11/21 0525 04/12/21 0510 04/13/21 0456 04/14/21 0528  NA 138 138 136 137 137  K 3.4* 3.7 3.4* 4.0 3.8  CL 98 97* 94* 97* 96*  CO2 34* 31 33* 31 32  GLUCOSE 145* 116* 113* 106* 107*  BUN 23 32* 38* 37* 41*  CREATININE 1.56* 1.53* 1.73* 1.50* 1.51*  CALCIUM 8.4* 8.5* 8.5* 8.4* 9.3  MG  --  2.1  --   --  2.6*  PHOS  --   --   --   --  4.0   GFR: Estimated Creatinine Clearance: 30 mL/min (A) (by C-G formula based on SCr of 1.51 mg/dL (H)). Liver Function Tests: Recent Labs  Lab 04/09/21 1102 04/10/21 0230  AST 15 28  ALT 10 9  ALKPHOS 51 47  BILITOT 0.6 1.6*  PROT 6.5 6.8  ALBUMIN 3.0* 2.9*   No results for input(s): LIPASE, AMYLASE in the last 168 hours. No results for input(s): AMMONIA in the last 168 hours. Coagulation Profile: Recent Labs  Lab 04/10/21 0230  INR 1.1   Cardiac Enzymes: No results for input(s): CKTOTAL, CKMB, CKMBINDEX, TROPONINI in the last 168 hours. BNP (last 3 results) No results for input(s): PROBNP in the last 8760 hours. HbA1C: No results for input(s): HGBA1C in the last 72 hours. CBG: No results for input(s): GLUCAP in the last 168 hours. Lipid Profile: No results for input(s): CHOL, HDL, LDLCALC, TRIG, CHOLHDL, LDLDIRECT in the last 72 hours. Thyroid Function Tests: No results for input(s): TSH, T4TOTAL, FREET4, T3FREE, THYROIDAB in the last 72 hours. Anemia Panel: No results for input(s): VITAMINB12, FOLATE, FERRITIN, TIBC, IRON, RETICCTPCT in the last 72 hours.  Sepsis Labs: Recent Labs  Lab 04/10/21 0230 04/11/21 0525  PROCALCITON <0.10 <0.10    Recent Results (from the past 240 hour(s))  Resp Panel by RT-PCR (Flu A&B, Covid) Nasopharyngeal Swab     Status: None   Collection Time: 04/09/21 11:02 AM   Specimen: Nasopharyngeal Swab; Nasopharyngeal(NP)  swabs in vial transport medium  Result Value Ref Range Status   SARS Coronavirus 2 by RT PCR NEGATIVE NEGATIVE Final    Comment: (NOTE) SARS-CoV-2 target nucleic acids are NOT DETECTED.  The SARS-CoV-2 RNA is generally detectable in upper respiratory specimens during the acute phase of infection. The lowest concentration of SARS-CoV-2 viral copies this assay can detect is 138 copies/mL. A negative result does not preclude SARS-Cov-2 infection and should not be used as the sole basis for treatment or other patient management decisions. A negative result may occur with  improper specimen collection/handling, submission of specimen other than nasopharyngeal swab, presence of viral mutation(s) within the areas targeted by this assay, and inadequate number of viral copies(<138 copies/mL). A negative result must be combined with clinical observations, patient history, and epidemiological information. The expected result is Negative.  Fact Sheet for Patients:  EntrepreneurPulse.com.au  Fact Sheet for Healthcare Providers:  IncredibleEmployment.be  This test is no t yet approved or cleared by the [place] FDA and  has been authorized for detection and/or diagnosis of SARS-CoV-2 by FDA under an Emergency Use Authorization (EUA). This EUA will remain  in effect (meaning this test can be used) for the duration of the COVID-19 declaration under Section 564(b)(1) of the Act, 21 U.S.C.section 360bbb-3(b)(1), unless the authorization is terminated  or revoked sooner.       Influenza A by PCR NEGATIVE NEGATIVE Final   Influenza B by PCR NEGATIVE NEGATIVE Final    Comment: (NOTE) The Xpert Xpress SARS-CoV-2/FLU/RSV plus assay is intended as an aid in the diagnosis of influenza from Nasopharyngeal swab specimens and should not be used as a sole basis for treatment. Nasal washings and aspirates are unacceptable for Xpert Xpress  SARS-CoV-2/FLU/RSV testing.  Fact Sheet for Patients: EntrepreneurPulse.com.au  Fact Sheet for Healthcare Providers: IncredibleEmployment.be  This test is not yet approved or cleared by the [place] FDA and has been  authorized for detection and/or diagnosis of SARS-CoV-2 by FDA under an Emergency Use Authorization (EUA). This EUA will remain in effect (meaning this test can be used) for the duration of the COVID-19 declaration under Section 564(b)(1) of the Act, 21 U.S.C. section 360bbb-3(b)(1), unless the authorization is terminated or revoked.  Performed at [facility], [place] [address], [place], [place] [address]     Radiology Studies: No results found.  Scheduled Meds:  aspirin EC  81 mg Oral QPM   clopidogrel  75 mg Oral q AM   ferrous gluconate  324 mg Oral Q breakfast   furosemide  40 mg Oral Daily   heparin  5,000 Units Subcutaneous Q8H   isosorbide mononitrate  30 mg Oral q AM   metoprolol tartrate  50 mg Oral q AM   NIFEdipine  90 mg Oral q AM   simvastatin  20 mg Oral QHS   sodium chloride flush  3 mL Intravenous Q12H   vitamin B-12  500 mcg Oral q AM   Continuous Infusions:  sodium chloride       LOS: 5 days    Time spent: 25 mins    Shawna Clamp, MD [facility]   If 7PM-7AM, please contact night-coverage

## 2021-04-14 NOTE — Plan of Care (Signed)
  Problem: Nutrition: Goal: Adequate nutrition will be maintained Outcome: Progressing   

## 2021-04-15 LAB — RESP PANEL BY RT-PCR (FLU A&B, COVID) ARPGX2
Influenza A by PCR: NEGATIVE
Influenza B by PCR: NEGATIVE
SARS Coronavirus 2 by RT PCR: NEGATIVE

## 2021-04-15 LAB — BASIC METABOLIC PANEL
Anion gap: 8 (ref 5–15)
BUN: 44 mg/dL — ABNORMAL HIGH (ref 8–23)
CO2: 31 mmol/L (ref 22–32)
Calcium: 9.3 mg/dL (ref 8.9–10.3)
Chloride: 97 mmol/L — ABNORMAL LOW (ref 98–111)
Creatinine, Ser: 1.69 mg/dL — ABNORMAL HIGH (ref 0.61–1.24)
GFR, Estimated: 41 mL/min — ABNORMAL LOW (ref >60)
Glucose, Bld: 102 mg/dL — ABNORMAL HIGH (ref 70–99)
Potassium: 4.2 mmol/L (ref 3.5–5.1)
Sodium: 136 mmol/L (ref 135–145)

## 2021-04-15 NOTE — TOC Progression Note (Signed)
Transition of Care [facility]) - Progression Note    Patient Details  Name: Earl Castillo MRN: [id number] Date of Birth: [birth year]  [facility] of [facility] CM/SW Contact  Servando Snare, [place] Phone Number: 04/15/2021, 9:50 AM  Clinical Narrative:   LCSW notified attending and floor RN that patient will need a negative COVID prior to dc to SNF. Facility can accept patient tomorrow (Monday).     Expected Discharge Plan: [place] Barriers to Discharge: Barriers Resolved  Expected Discharge Plan and Services Expected Discharge Plan: [place] Choice: [place] arrangements for the past 2 months: Single Family Home Expected Discharge Date: 04/13/21                         HH Arranged: PT, RN [place] Agency: [place] ([place]) Date HH Agency Contacted: 04/12/21 Time HH Agency Contacted: 1430 Representative spoke with at [place]: [place] ([place]) Interventions    Readmission Risk Interventions No flowsheet data found.

## 2021-04-15 NOTE — Progress Notes (Signed)
PROGRESS NOTE    Earl Castillo  DJT:[id number] DOB: [birth year] DOA: 04/09/2021 PCP: [name], MD   Brief Narrative:  This 80 years old male with PMH significant for hypertension, hyperlipidemia, CAD s/p CABG, GERD, PAD s/p left right femoral-femoral bypass graft in 2000 and recent left retinal artery occlusion in February 2022, recurrent falls, carotid artery disease s/p recent large cerebral angiogram on 04/06/21 with Dr. [name] via left common femoral puncture.  He presented to the hospital because of multiple falls at home which had occurred for about 2 days prior to the admission.  Assessment & Plan:   Principal Problem:   Acute CHF (congestive heart failure) (HCC) Active Problems:   Essential hypertension   Coronary atherosclerosis   PAD (peripheral artery disease) (HCC)   Acute respiratory failure with hypoxia (HCC)   Protein-calorie malnutrition, severe   Acute hypoxic respiratory failure secondary to new diastolic CHF:  BNP was 300.9.  Continued IV Lasix.  Monitor BMP, daily weights and urine output. He is afebrile and has normal WBC count.  Doubt pneumonia at this time. Repeat chest x-ray done today shows improving aeration of the lungs and continued right lower lobe atelectasis/infiltrate.  Discontinue IV antibiotics for now.  Procalcitonin 0.10. Echocardiogram shows LVEF 60 to 65%.  No regional wall motion abnormalities. He is weaned down to room air. BNP 32.0. Lasix discontinued.   Acute hypoxic respiratory failure:  > Resolved.  He is tolerating room air.   Recurrent falls at home: Continue OT/ PT,  recommended home with home health services. Patient is unsteady while standing.  PT reassessment recommended skilled nursing facility. Son is agreeable to the SNF placement.  Awaiting authorization.   CAD, PVD, symptomatic left carotid artery stenosis s/p recent R cerebral angiogram on 04/06/2021 with Dr. [name]:  Continue aspirin, Plavix and statins.  No evidence of  acute stroke on CT head.   Outpatient follow-up with vascular surgeon for planned complex, hybrid carotid revascularization.   Hypertension: Continue Home BP medications.  Monitor BP closely.   Acute kidney injury: > Improving This could be secondary to IV diuretic therapy. Avoid nephrotoxic medications, Lasix discontinued. Recheck labs tomorrow morning.   DVT prophylaxis: Heparin sq Code Status: DNR Family Communication: Son Todd at bed side. Disposition Plan:    Status is: Inpatient  Remains inpatient appropriate because:Inpatient level of care appropriate due to severity of illness   Dispo: The patient is from: Home              Anticipated d/c is to: SNF in 1-2 days.              Patient currently is not medically stable to d/c.   Difficult to place patient No   Consultants:   None  Procedures: None Antimicrobials:  Anti-infectives (From admission, onward)    Start     Dose/Rate Route Frequency Ordered Stop   04/10/21 1400  cefTRIAXone (ROCEPHIN) 1 g in sodium chloride 0.9 % 100 mL IVPB  Status:  Discontinued        1 g 200 mL/hr over 30 Minutes Intravenous Every 24 hours 04/09/21 1610 04/13/21 1402   04/10/21 1400  azithromycin (ZITHROMAX) 500 mg in sodium chloride 0.9 % 250 mL IVPB  Status:  Discontinued        500 mg 250 mL/hr over 60 Minutes Intravenous Every 24 hours 04/09/21 1610 04/10/21 1102   04/10/21 1400  azithromycin (ZITHROMAX) tablet 500 mg  Status:  Discontinued  500 mg Oral Daily 04/10/21 1102 04/13/21 1402   04/09/21 1527  ceFAZolin (ANCEF) IVPB 2g/100 mL premix  Status:  Discontinued        2 g 200 mL/hr over 30 Minutes Intravenous 30 min pre-op 04/09/21 1527 04/09/21 1609   04/09/21 1345  cefTRIAXone (ROCEPHIN) 1 g in sodium chloride 0.9 % 100 mL IVPB        1 g 200 mL/hr over 30 Minutes Intravenous  Once 04/09/21 1332 04/09/21 1430   04/09/21 1345  azithromycin (ZITHROMAX) 500 mg in sodium chloride 0.9 % 250 mL IVPB        500 mg 250  mL/hr over 60 Minutes Intravenous  Once 04/09/21 1332 04/09/21 1535      Subjective: Patient was seen and examined at bedside.  Overnight events noted.   Patient denies any chest pain, shortness of breath, leg edema has resolved.   Patient feels unsteady while standing.  Patient and family were agreeable to SNF placement. Patient is frustrated with the long wait to be discharged in the nursing home.  Objective: Vitals:   04/14/21 2046 04/15/21 0500 04/15/21 0511 04/15/21 1248  BP: (!) 129/54  (!) 135/52 (!) 139/58  Pulse: 66  63 (!) 54  Resp: 18  18 18   Temp: 97.6 F (36.4 C)  97.7 F (36.5 C) 98 F (36.7 C)  TempSrc:    Oral  SpO2: 93%  98% 96%  Weight:  52.3 kg    Height:        Intake/Output Summary (Last 24 hours) at 04/15/2021 1325 Last data filed at 04/15/2021 0900 Gross per 24 hour  Intake 720 ml  Output 950 ml  Net -230 ml    Filed Weights   04/13/21 0500 04/14/21 0500 04/15/21 0500  Weight: 53 kg 53.4 kg 52.3 kg    Examination:  General exam: Appears calm and comfortable , not in any acute distress.Appears frustrated. Respiratory system: Clear to auscultation. Respiratory effort normal. Cardiovascular system: S1 & S2 heard, RRR. No JVD, murmurs, rubs, gallops or clicks. No pedal edema. Gastrointestinal system: Abdomen is nondistended, soft and nontender. No organomegaly or masses felt.  Normal bowel sounds heard. Central nervous system: Alert and oriented x 2. No focal neurological deficits. Extremities:  No edema, no cyanosis, no clubbing. Skin: No rashes, lesions or ulcers Psychiatry: Judgement and insight appear normal. Mood & affect appropriate.     Data Reviewed: I have personally reviewed following labs and imaging studies  CBC: Recent Labs  Lab 04/09/21 1102 04/10/21 0230 04/10/21 1509 04/11/21 0525 04/12/21 0510 04/13/21 0456 04/14/21 0528  WBC 7.4   < > 8.1 9.0 8.5 9.0 8.3  NEUTROABS 5.9  --   --   --   --   --   --   HGB 10.7*   < >  11.4* 11.3* 11.5* 11.3* 11.4*  HCT 34.3*   < > 35.1* 35.5* 35.5* 36.0* 35.9*  MCV 104.9*   < > 102.0* 102.3* 100.6* 104.7* 103.5*  PLT 201   < > 242 249 250 244 281   < > = values in this interval not displayed.    Basic Metabolic Panel: Recent Labs  Lab 04/11/21 0525 04/12/21 0510 04/13/21 0456 04/14/21 0528 04/15/21 0524  NA 138 136 137 137 136  K 3.7 3.4* 4.0 3.8 4.2  CL 97* 94* 97* 96* 97*  CO2 31 33* 31 32 31  GLUCOSE 116* 113* 106* 107* 102*  BUN 32* 38* 37* 41* 44*  CREATININE 1.53* 1.73* 1.50* 1.51* 1.69*  CALCIUM 8.5* 8.5* 8.4* 9.3 9.3  MG 2.1  --   --  2.6*  --   PHOS  --   --   --  4.0  --     GFR: Estimated Creatinine Clearance: 26.2 mL/min (A) (by C-G formula based on SCr of 1.69 mg/dL (H)). Liver Function Tests: Recent Labs  Lab 04/09/21 1102 04/10/21 0230  AST 15 28  ALT 10 9  ALKPHOS 51 47  BILITOT 0.6 1.6*  PROT 6.5 6.8  ALBUMIN 3.0* 2.9*    No results for input(s): LIPASE, AMYLASE in the last 168 hours. No results for input(s): AMMONIA in the last 168 hours. Coagulation Profile: Recent Labs  Lab 04/10/21 0230  INR 1.1    Cardiac Enzymes: No results for input(s): CKTOTAL, CKMB, CKMBINDEX, TROPONINI in the last 168 hours. BNP (last 3 results) No results for input(s): PROBNP in the last 8760 hours. HbA1C: No results for input(s): HGBA1C in the last 72 hours. CBG: No results for input(s): GLUCAP in the last 168 hours. Lipid Profile: No results for input(s): CHOL, HDL, LDLCALC, TRIG, CHOLHDL, LDLDIRECT in the last 72 hours. Thyroid Function Tests: No results for input(s): TSH, T4TOTAL, FREET4, T3FREE, THYROIDAB in the last 72 hours. Anemia Panel: No results for input(s): VITAMINB12, FOLATE, FERRITIN, TIBC, IRON, RETICCTPCT in the last 72 hours.  Sepsis Labs: Recent Labs  Lab 04/10/21 0230 04/11/21 0525  PROCALCITON <0.10 <0.10     Recent Results (from the past 240 hour(s))  Resp Panel by RT-PCR (Flu A&B, Covid) Nasopharyngeal  Swab     Status: None   Collection Time: 04/09/21 11:02 AM   Specimen: Nasopharyngeal Swab; Nasopharyngeal(NP) swabs in vial transport medium  Result Value Ref Range Status   SARS Coronavirus 2 by RT PCR NEGATIVE NEGATIVE Final    Comment: (NOTE) SARS-CoV-2 target nucleic acids are NOT DETECTED.  The SARS-CoV-2 RNA is generally detectable in upper respiratory specimens during the acute phase of infection. The lowest concentration of SARS-CoV-2 viral copies this assay can detect is 138 copies/mL. A negative result does not preclude SARS-Cov-2 infection and should not be used as the sole basis for treatment or other patient management decisions. A negative result may occur with  improper specimen collection/handling, submission of specimen other than nasopharyngeal swab, presence of viral mutation(s) within the areas targeted by this assay, and inadequate number of viral copies(<138 copies/mL). A negative result must be combined with clinical observations, patient history, and epidemiological information. The expected result is Negative.  Fact Sheet for Patients:  EntrepreneurPulse.com.au  Fact Sheet for Healthcare Providers:  IncredibleEmployment.be  This test is no t yet approved or cleared by the [place] FDA and  has been authorized for detection and/or diagnosis of SARS-CoV-2 by FDA under an Emergency Use Authorization (EUA). This EUA will remain  in effect (meaning this test can be used) for the duration of the COVID-19 declaration under Section 564(b)(1) of the Act, 21 U.S.C.section 360bbb-3(b)(1), unless the authorization is terminated  or revoked sooner.       Influenza A by PCR NEGATIVE NEGATIVE Final   Influenza B by PCR NEGATIVE NEGATIVE Final    Comment: (NOTE) The Xpert Xpress SARS-CoV-2/FLU/RSV plus assay is intended as an aid in the diagnosis of influenza from Nasopharyngeal swab specimens and should not be used as a sole  basis for treatment. Nasal washings and aspirates are unacceptable for Xpert Xpress SARS-CoV-2/FLU/RSV testing.  Fact Sheet for Patients: EntrepreneurPulse.com.au  Fact  Sheet for Healthcare Providers: IncredibleEmployment.be  This test is not yet approved or cleared by the [place] and has been authorized for detection and/or diagnosis of SARS-CoV-2 by FDA under an Emergency Use Authorization (EUA). This EUA will remain in effect (meaning this test can be used) for the duration of the COVID-19 declaration under Section 564(b)(1) of the Act, 21 U.S.C. section 360bbb-3(b)(1), unless the authorization is terminated or revoked.  Performed at [facility], [place] [address], [place], [place] [address]   Resp Panel by RT-PCR (Flu A&B, Covid) Nasopharyngeal Swab     Status: None   Collection Time: 04/14/21 12:39 PM   Specimen: Nasopharyngeal Swab; Nasopharyngeal(NP) swabs in vial transport medium  Result Value Ref Range Status   SARS Coronavirus 2 by RT PCR NEGATIVE NEGATIVE Final    Comment: (NOTE) SARS-CoV-2 target nucleic acids are NOT DETECTED.  The SARS-CoV-2 RNA is generally detectable in upper respiratory specimens during the acute phase of infection. The lowest concentration of SARS-CoV-2 viral copies this assay can detect is 138 copies/mL. A negative result does not preclude SARS-Cov-2 infection and should not be used as the sole basis for treatment or other patient management decisions. A negative result may occur with  improper specimen collection/handling, submission of specimen other than nasopharyngeal swab, presence of viral mutation(s) within the areas targeted by this assay, and inadequate number of viral copies(<138 copies/mL). A negative result must be combined with clinical observations, patient history, and epidemiological information. The expected result is Negative.  Fact Sheet for Patients:   EntrepreneurPulse.com.au  Fact Sheet for Healthcare Providers:  IncredibleEmployment.be  This test is no t yet approved or cleared by the [place] FDA and  has been authorized for detection and/or diagnosis of SARS-CoV-2 by FDA under an Emergency Use Authorization (EUA). This EUA will remain  in effect (meaning this test can be used) for the duration of the COVID-19 declaration under Section 564(b)(1) of the Act, 21 U.S.C.section 360bbb-3(b)(1), unless the authorization is terminated  or revoked sooner.       Influenza A by PCR NEGATIVE NEGATIVE Final   Influenza B by PCR NEGATIVE NEGATIVE Final    Comment: (NOTE) The Xpert Xpress SARS-CoV-2/FLU/RSV plus assay is intended as an aid in the diagnosis of influenza from Nasopharyngeal swab specimens and should not be used as a sole basis for treatment. Nasal washings and aspirates are unacceptable for Xpert Xpress SARS-CoV-2/FLU/RSV testing.  Fact Sheet for Patients: EntrepreneurPulse.com.au  Fact Sheet for Healthcare Providers: IncredibleEmployment.be  This test is not yet approved or cleared by the [place] FDA and has been authorized for detection and/or diagnosis of SARS-CoV-2 by FDA under an Emergency Use Authorization (EUA). This EUA will remain in effect (meaning this test can be used) for the duration of the COVID-19 declaration under Section 564(b)(1) of the Act, 21 U.S.C. section 360bbb-3(b)(1), unless the authorization is terminated or revoked.  Performed at [facility], [place] [address] [place],  [address]     Radiology Studies: No results found.  Scheduled Meds:  aspirin EC  81 mg Oral QPM   clopidogrel  75 mg Oral q AM   ferrous gluconate  324 mg Oral Q breakfast   heparin  5,000 Units Subcutaneous Q8H   isosorbide mononitrate  30 mg Oral q AM   metoprolol tartrate  50 mg Oral q AM   NIFEdipine  90  mg Oral q AM   simvastatin  20 mg Oral QHS   sodium  chloride flush  3 mL Intravenous Q12H   vitamin B-12  500 mcg Oral q AM   Continuous Infusions:  sodium chloride       LOS: 6 days    Time spent: 25 mins    Shawna Clamp, MD [facility]   If 7PM-7AM, please contact night-coverage

## 2021-04-16 DIAGNOSIS — R2681 Unsteadiness on feet: Secondary | ICD-10-CM | POA: Diagnosis not present

## 2021-04-16 DIAGNOSIS — I6523 Occlusion and stenosis of bilateral carotid arteries: Secondary | ICD-10-CM | POA: Diagnosis not present

## 2021-04-16 DIAGNOSIS — Z720 Tobacco use: Secondary | ICD-10-CM | POA: Diagnosis not present

## 2021-04-16 DIAGNOSIS — D519 Vitamin B12 deficiency anemia, unspecified: Secondary | ICD-10-CM | POA: Diagnosis not present

## 2021-04-16 DIAGNOSIS — R41841 Cognitive communication deficit: Secondary | ICD-10-CM | POA: Diagnosis not present

## 2021-04-16 DIAGNOSIS — D649 Anemia, unspecified: Secondary | ICD-10-CM | POA: Diagnosis not present

## 2021-04-16 DIAGNOSIS — K219 Gastro-esophageal reflux disease without esophagitis: Secondary | ICD-10-CM | POA: Diagnosis not present

## 2021-04-16 DIAGNOSIS — Z20822 Contact with and (suspected) exposure to covid-19: Secondary | ICD-10-CM | POA: Diagnosis not present

## 2021-04-16 DIAGNOSIS — R531 Weakness: Secondary | ICD-10-CM | POA: Diagnosis not present

## 2021-04-16 DIAGNOSIS — M1990 Unspecified osteoarthritis, unspecified site: Secondary | ICD-10-CM | POA: Diagnosis not present

## 2021-04-16 DIAGNOSIS — J9601 Acute respiratory failure with hypoxia: Secondary | ICD-10-CM | POA: Diagnosis not present

## 2021-04-16 DIAGNOSIS — N179 Acute kidney failure, unspecified: Secondary | ICD-10-CM | POA: Diagnosis not present

## 2021-04-16 DIAGNOSIS — R278 Other lack of coordination: Secondary | ICD-10-CM | POA: Diagnosis not present

## 2021-04-16 DIAGNOSIS — Z515 Encounter for palliative care: Secondary | ICD-10-CM | POA: Diagnosis not present

## 2021-04-16 DIAGNOSIS — M6281 Muscle weakness (generalized): Secondary | ICD-10-CM | POA: Diagnosis not present

## 2021-04-16 DIAGNOSIS — Z538 Procedure and treatment not carried out for other reasons: Secondary | ICD-10-CM | POA: Diagnosis not present

## 2021-04-16 DIAGNOSIS — E785 Hyperlipidemia, unspecified: Secondary | ICD-10-CM | POA: Diagnosis not present

## 2021-04-16 DIAGNOSIS — I10 Essential (primary) hypertension: Secondary | ICD-10-CM | POA: Diagnosis not present

## 2021-04-16 DIAGNOSIS — R5381 Other malaise: Secondary | ICD-10-CM | POA: Diagnosis not present

## 2021-04-16 DIAGNOSIS — H47012 Ischemic optic neuropathy, left eye: Secondary | ICD-10-CM | POA: Diagnosis not present

## 2021-04-16 DIAGNOSIS — Z9181 History of falling: Secondary | ICD-10-CM | POA: Diagnosis not present

## 2021-04-16 DIAGNOSIS — D3131 Benign neoplasm of right choroid: Secondary | ICD-10-CM | POA: Diagnosis not present

## 2021-04-16 DIAGNOSIS — D508 Other iron deficiency anemias: Secondary | ICD-10-CM | POA: Diagnosis not present

## 2021-04-16 DIAGNOSIS — R0989 Other specified symptoms and signs involving the circulatory and respiratory systems: Secondary | ICD-10-CM | POA: Diagnosis not present

## 2021-04-16 DIAGNOSIS — I509 Heart failure, unspecified: Secondary | ICD-10-CM | POA: Diagnosis not present

## 2021-04-16 DIAGNOSIS — E538 Deficiency of other specified B group vitamins: Secondary | ICD-10-CM | POA: Diagnosis not present

## 2021-04-16 DIAGNOSIS — I779 Disorder of arteries and arterioles, unspecified: Secondary | ICD-10-CM | POA: Diagnosis not present

## 2021-04-16 DIAGNOSIS — Z951 Presence of aortocoronary bypass graft: Secondary | ICD-10-CM | POA: Diagnosis not present

## 2021-04-16 DIAGNOSIS — I2510 Atherosclerotic heart disease of native coronary artery without angina pectoris: Secondary | ICD-10-CM | POA: Diagnosis not present

## 2021-04-16 DIAGNOSIS — F17210 Nicotine dependence, cigarettes, uncomplicated: Secondary | ICD-10-CM | POA: Diagnosis not present

## 2021-04-16 DIAGNOSIS — I739 Peripheral vascular disease, unspecified: Secondary | ICD-10-CM | POA: Diagnosis not present

## 2021-04-16 DIAGNOSIS — Z743 Need for continuous supervision: Secondary | ICD-10-CM | POA: Diagnosis not present

## 2021-04-16 LAB — BASIC METABOLIC PANEL
Anion gap: 7 (ref 5–15)
BUN: 43 mg/dL — ABNORMAL HIGH (ref 8–23)
CO2: 31 mmol/L (ref 22–32)
Calcium: 9.2 mg/dL (ref 8.9–10.3)
Chloride: 98 mmol/L (ref 98–111)
Creatinine, Ser: 1.42 mg/dL — ABNORMAL HIGH (ref 0.61–1.24)
GFR, Estimated: 50 mL/min — ABNORMAL LOW (ref >60)
Glucose, Bld: 101 mg/dL — ABNORMAL HIGH (ref 70–99)
Potassium: 4.5 mmol/L (ref 3.5–5.1)
Sodium: 136 mmol/L (ref 135–145)

## 2021-04-16 NOTE — Progress Notes (Signed)
Report called to Caryl Asp, Therapist, sports at [facility]. [facility] called. Awaiting transport to facility.

## 2021-04-16 NOTE — Discharge Summary (Addendum)
Physician Discharge Summary  Earl Castillo GBT:[id number] DOB: [birth year] DOA: 04/09/2021  PCP: Earl Morale, MD  Admit date: 04/09/2021.  Discharge date: 04/16/2021.  Admitted From: Home.  Disposition: SNF ( [facility])  Recommendations for Outpatient Follow-up:  Follow up with PCP in 1-2 weeks. Please obtain BMP/CBC in one week Advised to follow-up with cardiology as scheduled. Advised to follow-up with vascular surgeon as scheduled. Advised to hold losartan until renal functions improves.  Home Health:None Equipment/Devices:None  Discharge Condition: Stable CODE STATUS:DNR Diet recommendation: Heart Healthy   Brief Summary / Hospital Course: This 80 years old male with PMH significant for hypertension, hyperlipidemia, CAD s/p CABG, GERD, PAD s/p left right femoral-femoral bypass graft in 2000 and recent left retinal artery occlusion in February 2022, recurrent falls, carotid artery disease s/p recent large cerebral angiogram on 04/06/21 with Dr. [name] via left common femoral puncture.  He presented to the hospital because of multiple falls at home which had occurred for about 2 days prior to the admission. He was admitted for acute hypoxic respiratory failure secondary to new diastolic CHF.  He was found to have a BNP of 622, chest x-ray shows pulmonary edema and infiltrate.  Patient was started on IV Lasix.  He was initially requiring oxygen and started on empiric antibiotics.  Procalcitonin level 0.10, antibiotics discontinued.  Patient was managed for CHF exacerbation.  He is weaned down to room air.  Acute respiratory failure resolved.  PT initially recommended home with home PT.  Patient was unsteady,  son wanted patient to be discharged to a  nursing home.  Patient has agreed.  Patient is being discharged to [facility] for rehabilitation.  Losartan is on hold because of acute kidney injury,  renal functions are improving.  Advised patient to follow-up with cardiologist  and vascular surgeon for scheduled procedures.  He was managed for below problems.  Discharge Diagnoses:  Principal Problem:   Acute CHF (congestive heart failure) (HCC) Active Problems:   Essential hypertension   Coronary atherosclerosis   PAD (peripheral artery disease) (HCC)   Acute respiratory failure with hypoxia (HCC)   Protein-calorie malnutrition, severe  Acute hypoxic respiratory failure secondary to new diastolic CHF: > Resolved. BNP was 622.8.  Continued IV Lasix.  Monitor BMP, daily weights and urine output. He is afebrile and has normal WBC count.  Doubt pneumonia at this time. Repeat chest x-ray done shows improving aeration of the lungs and continued right lower lobe atelectasis/infiltrate. Discontinue IV antibiotics for now.  Procalcitonin 0.10. Echocardiogram shows LVEF 60 to 65%.  No regional wall motion abnormalities. He is weaned down to room air. BNP 32.0. Lasix discontinued.   Acute hypoxic respiratory failure:  > Resolved.  He is tolerating room air.   Recurrent falls at home: Continue OT/ PT,  recommended home with home health services. Patient is unsteady while standing.  PT reassessment recommended skilled nursing facility. Son is agreeable to the SNF placement.  Discharged to [facility] for rehab.   CAD, PVD, symptomatic left carotid artery stenosis s/p recent R cerebral angiogram on 04/06/2021 with Dr. [name]:  Continue aspirin, Plavix and statins.  No evidence of acute stroke on CT head.   Outpatient follow-up with vascular surgeon for planned complex, hybrid carotid revascularization.   Hypertension: Continue Home BP medications.  Monitor BP closely.   Acute kidney injury: > Improving This could be secondary to IV diuretic therapy. Avoid nephrotoxic medications, Lasix discontinued. Recheck labs tomorrow morning.  Discharge Instructions  Discharge  Instructions     Call MD for:  difficulty breathing, headache or visual disturbances    Complete by: As directed    Call MD for:  difficulty breathing, headache or visual disturbances   Complete by: As directed    Call MD for:  persistant dizziness or light-headedness   Complete by: As directed    Call MD for:  persistant dizziness or light-headedness   Complete by: As directed    Call MD for:  persistant nausea and vomiting   Complete by: As directed    Call MD for:  persistant nausea and vomiting   Complete by: As directed    Diet - low sodium heart healthy   Complete by: As directed    Diet - low sodium heart healthy   Complete by: As directed    Diet Carb Modified   Complete by: As directed    Diet Carb Modified   Complete by: As directed    Discharge instructions   Complete by: As directed    Advised to follow-up with primary care physician in 1 week. Advised to follow-up with cardiology as scheduled. Patient has completed antibiotic treatment for community-acquired pneumonia.   Discharge instructions   Complete by: As directed    Advised to follow-up with primary care physician in 1 week. Advised to follow-up with vascular surgeon as scheduled. Advised to hold losartan until renal functions improves.   Increase activity slowly   Complete by: As directed    Increase activity slowly   Complete by: As directed       Allergies as of 04/16/2021   No Known Allergies      Medication List     STOP taking these medications    losartan-hydrochlorothiazide 100-25 MG tablet Commonly known as: HYZAAR       TAKE these medications    acetaminophen 500 MG tablet Commonly known as: TYLENOL Take 1,000 mg by mouth daily as needed for moderate pain (for leg pain).   aspirin EC 81 MG tablet Take 81 mg by mouth every evening.   clopidogrel 75 MG tablet Commonly known as: PLAVIX TAKE 1 TABLET BY MOUTH EVERY DAY What changed: when to take this   docusate sodium 100 MG capsule Commonly known as: COLACE Take 100 mg by mouth daily as needed (for  constipation).   ferrous gluconate 324 MG tablet Commonly known as: FERGON TAKE 1 TABLET BY MOUTH EVERY DAY WITH BREAKFAST. What changed:  how much to take how to take this when to take this additional instructions   hydrochlorothiazide 25 MG tablet Commonly known as: HYDRODIURIL Take 25 mg by mouth daily.   isosorbide mononitrate 30 MG 24 hr tablet Commonly known as: IMDUR TAKE 1 TABLET (30 MG TOTAL) BY MOUTH DAILY. NEED OV. What changed:  when to take this additional instructions   metoprolol tartrate 50 MG tablet Commonly known as: LOPRESSOR TAKE 1 TABLET BY MOUTH EVERY DAY What changed: when to take this   NIFEdipine 90 MG 24 hr tablet Commonly known as: ADALAT CC TAKE 1 TABLET BY MOUTH EVERY DAY What changed: when to take this   simvastatin 20 MG tablet Commonly known as: ZOCOR TAKE 1 TABLET (20 MG TOTAL) BY MOUTH DAILY AT 6 PM. NEED OV. What changed:  when to take this additional instructions   vitamin B-12 500 MCG tablet Commonly known as: CYANOCOBALAMIN Take 500 mcg by mouth in the morning.        Follow-up Information     Sarajane Jews,  Ishmael Holter, MD Follow up in 1 week(s).   Specialty: Family Medicine Contact information: [place] [address] [phone]         [name], MD .   Specialties: Cardiology, Radiology Contact information: [address] [place] [address] [phone]                No Known Allergies  Consultations: Cardiology   Procedures/Studies: DG Chest 2 View  Result Date: 04/09/2021 CLINICAL DATA:  Cough, shortness of breath EXAM: CHEST - 2 VIEW COMPARISON:  08/25/2017 FINDINGS: Prior CABG. Heart is normal size. Bilateral airspace disease noted, right greater than left. Small bilateral effusions. Scattered aortic atherosclerosis. No acute bony abnormality. IMPRESSION: Bilateral airspace disease and effusions, right greater than left. This could represent asymmetric  edema or pneumonia. Electronically Signed   By: Rolm Baptise M.D.   On: 04/09/2021 10:56   CT HEAD WO CONTRAST  Result Date: 04/09/2021 CLINICAL DATA:  Left lower extremity paresthesia EXAM: CT HEAD WITHOUT CONTRAST TECHNIQUE: Contiguous axial images were obtained from the base of the skull through the vertex without intravenous contrast. COMPARISON:  None. FINDINGS: Brain: Normal anatomic configuration. Parenchymal volume loss is commensurate with the patient's age. Moderate periventricular white matter changes are present likely reflecting the sequela of small vessel ischemia. Remote right parieto-occipital cortical infarct and right thalamic lacunar infarcts are noted. No abnormal intra or extra-axial mass lesion or fluid collection. No abnormal mass effect or midline shift. No evidence of acute intracranial hemorrhage or infarct. Borderline ventriculomegaly is commensurate with the degree of parenchymal volume loss. Cerebellum unremarkable. Vascular: No asymmetric hyperdense vasculature at the skull base. Extensive atherosclerotic calcification is seen within the petrous portions of the carotid arteries and carotid siphons bilaterally. Skull: Intact Sinuses/Orbits: Paranasal sinuses are clear. Orbits are unremarkable. Other: Mastoid air cells and middle ear cavities are clear. IMPRESSION: No acute intracranial hemorrhage or infarct. Moderate senescent change. Remote right thalamic and right parieto-occipital cortical infarcts. Advanced intracranial atherosclerotic disease. Electronically Signed   By: Fidela Salisbury MD   On: 04/09/2021 22:18   CT Angio Neck W/Cm &/Or Wo/Cm  Result Date: 03/27/2021 CLINICAL DATA:  Stroke/TIA. History of right carotid endarterectomy x2 Creatinine was obtained on site at [facility] at [address] Results: Creatinine 0.9 mg/dL. EXAM: CT ANGIOGRAPHY NECK TECHNIQUE: Multidetector CT imaging of the neck was performed using the standard protocol during bolus  administration of intravenous contrast. Multiplanar CT image reconstructions and MIPs were obtained to evaluate the vascular anatomy. Carotid stenosis measurements (when applicable) are obtained utilizing NASCET criteria, using the distal internal carotid diameter as the denominator. CONTRAST:  105mL ISOVUE-370 IOPAMIDOL (ISOVUE-370) INJECTION 76% COMPARISON:  None. FINDINGS: Aortic arch: Extensive atherosclerotic calcification throughout the aortic arch and proximal great vessels. Moderate stenosis at the origin of the left common carotid artery. There is extensive atherosclerotic calcification throughout the carotid and vertebral arteries bilaterally Right carotid system: Atherosclerotic calcification throughout the right common carotid artery with up to 40% diameter stenosis. Right carotid endarterectomy appears widely patent. There is atherosclerotic calcification throughout the right internal carotid artery extending into the skull base and cavernous segment. Mild to moderate stenosis of the cavernous carotid on the right Left carotid system: Extensive atherosclerotic disease throughout the left common carotid artery with moderate stenosis at the origin. Less than 50% diameter stenosis left common carotid artery distally. Atherosclerotic calcification in the left carotid bifurcation. Minimal luminal diameter is present in the proximal mid and  distal cervical internal carotid artery on the left due to heavily calcified disease. Lumen is difficult to differentiate from calcification. Accurate measurements not possible but estimated to be greater than 80% diameter stenosis at multiple locations throughout the left internal carotid artery. Calcification extends into the left cavernous carotid with moderate stenosis. Vertebral arteries: Stenosis the origin bilaterally. Atherosclerotic calcification throughout both vertebral arteries. Moderate both vertebral arteries are patent to the basilar. Skeleton: Negative Other  neck: Negative for mass or adenopathy in the neck. Upper chest: Apical emphysema without acute abnormality IMPRESSION: Advanced atherosclerotic disease in the aortic arch and throughout both carotid and vertebral arteries Right carotid endarterectomy appears widely patent. However, there is significant atherosclerotic disease in the right common carotid artery and throughout the right internal carotid artery extending into the skull base and cavernous segment. Moderate stenosis of the origin of the left common carotid artery with atherosclerotic disease present throughout the left common and internal carotid artery on the left. There is severe stenosis of the left internal carotid artery at the skull base as well as throughout the cervical carotid due to atherosclerotic calcification. There is moderate stenosis in the left cavernous carotid. Electronically Signed   By: Franchot Gallo M.D.   On: 03/27/2021 09:38   CT Angio Chest PE W/Cm &/Or Wo Cm  Addendum Date: 04/10/2021   ADDENDUM REPORT: 04/10/2021 08:56 ADDENDUM: Initial report by Dr. Rosario Jacks, addendum by Dr. Jobe Igo. Upon further review, there is an acute right eleventh rib minimally displaced fracture, including on 157/4. There are remote bilateral lower rib fractures as well. Electronically Signed   By: Abigail Miyamoto M.D.   On: 04/10/2021 08:56   Result Date: 04/10/2021 CLINICAL DATA:  Shortness of breath and weakness.  Recent surgery. EXAM: CT ANGIOGRAPHY CHEST WITH CONTRAST TECHNIQUE: Multidetector CT imaging of the chest was performed using the standard protocol during bolus administration of intravenous contrast. Multiplanar CT image reconstructions and MIPs were obtained to evaluate the vascular anatomy. CONTRAST:  44mL OMNIPAQUE IOHEXOL 350 MG/ML SOLN COMPARISON:  None. FINDINGS: Cardiovascular: Image quality is degraded by respiratory motion. No pulmonary embolus. Atherosclerotic calcification of the aorta and coronary arteries. Pulmonic trunk and  heart are enlarged. No pericardial effusion. Mediastinum/Nodes: No pathologically enlarged mediastinal, hilar or axillary lymph nodes. Esophagus is grossly unremarkable. Lungs/Pleura: Centrilobular and paraseptal emphysema. Image quality is degraded by respiratory motion. Large right, and small left, pleural effusions. Collapse/consolidation in the right lower lobe. Minimal compressive atelectasis in the left lower lobe. Basilar predominant septal thickening with patchy ground-glass in the posterior right upper lobe. Airway is unremarkable. Upper Abdomen: 3.5 cm low-attenuation lesion in the left hepatic lobe is likely a cyst. Visualized portions of the liver, gallbladder, adrenal glands and right kidney are otherwise unremarkable. Exophytic low-attenuation lesion off the left kidney measures 2.1 cm, incompletely visualized. Visualized portions of the spleen, pancreas, stomach and bowel are grossly unremarkable. Musculoskeletal: Degenerative changes in the spine. No worrisome lytic or sclerotic lesions. Flowing anterior osteophytosis in the thoracic spine. Review of the MIP images confirms the above findings. IMPRESSION: 1. Negative for pulmonary embolus. 2. Congestive heart failure with right greater than left pleural effusions. 3. Difficult to exclude superimposed pneumonia in the right upper and right lower lobes. 4. Aortic atherosclerosis (ICD10-I70.0). Coronary artery calcification. 5. Enlarged pulmonic trunk, indicative of pulmonary arterial hypertension. 6.  Emphysema (ICD10-J43.9). Electronically Signed: By: Lorin Picket M.D. On: 04/09/2021 13:26   PERIPHERAL VASCULAR CATHETERIZATION  Result Date: 04/06/2021 Procedure: Arch aortogram, ultrasound groin, retrograde left  common femoral puncture Preoperative diagnosis: Symptomatic left carotid stenosis Postoperative diagnosis: Same Anesthesia: Local Operative findings: 1.  Severe calcific greater than 90% stenosis left internal carotid artery 2.  Type I  aortic arch 3.  50% ostial stenosis left common carotid artery Operative details: After obtaining informed consent, the patient was taken the PV lab.  The patient was placed in supine position on the angio table.  Both groins were prepped and draped in usual sterile fashion.  Fluoroscopy was used to identify the patient's femoral head.  Ultrasound was used to identify a pre-existing femoral-femoral bypass.  I was able to find an area just above the takeoff of the femoral-femoral bypass and over the center of the femoral head for cannulation.  Using ultrasound guidance I was able to cannulate the left common femoral artery using a micropuncture needle.  Micropuncture wire was advanced in the left iliac system.  The micropuncture sheath was placed over this and the wire then replaced with an 035 versa core wire.  5 French sheath was placed over the guidewire the left common femoral artery.  This was thoroughly flushed with heparinized saline.  5 French pigtail catheter was then advanced over the versa core wire all the way up into the ascending aorta.  In a 40 degree LAO projection an arch aortogram was performed.  This showed normal arch anatomy with no significant stenosis of the innominate right common or right subclavian arteries.  There was no significant left subclavian artery stenosis.  Vertebral arteries were both patent and antegrade.  The left common carotid origin has about a 50% stenosis.  The left common carotid artery is fairly smooth in caliber all the way up to the carotid bifurcation.  There is a heavily calcified plaque just distal to the origin of the left internal carotid artery which extends all the way to the skull base.  The distal left internal carotid artery does fill although it is small about a 2-1/2 to 3 mm vessel. I then attempted to take several other views with increased dose opacification of contrast but the images were fairly grainy.  I spoke with Dr. [name] and he felt he had  adequate imaging for intervention in the near future if the patient requires this.  At this point the pigtail catheter was removed over guidewire and the 5 French sheath left in place to be pulled in the holding area.  The patient tolerated the procedure well and there were no complications.  The patient was taken the holding area in stable condition. Ruta Hinds, MD [facility] Office: [phone]   DG CHEST PORT 1 VIEW  Result Date: 04/12/2021 CLINICAL DATA:  History of acute congestive heart failure, weakness, cough, shortness of breath. History of pneumonia EXAM: PORTABLE CHEST 1 VIEW.  Patient is rotated. COMPARISON:  Chest x-ray 04/10/2021, CT chest 04/11/2021 FINDINGS: The heart size and mediastinal contours are unchanged. Aortic arch calcification. The large main pulmonary artery. Biapical pleural/pulmonary scarring. No pulmonary edema. No pneumothorax. Persistent trace to small volume right and trace left pleural effusions. No acute osseous abnormality. IMPRESSION: 1. Persistent trace to small volume right and trace left pleural effusions. 2. Pulmonary hypertension. Electronically Signed   By: Iven Finn M.D.   On: 04/12/2021 05:39   DG CHEST PORT 1 VIEW  Result Date: 04/10/2021 CLINICAL DATA:  Multiple falls. EXAM: PORTABLE CHEST 1 VIEW COMPARISON:  04/09/2021 FINDINGS: Prior CABG. Heart is normal size. Improving bilateral airspace disease. Continued opacity at the  right lung base. No effusions. No confluent opacities on the left. No acute bony abnormality. IMPRESSION: Improving aeration of the lungs. Continued right lower lobe atelectasis or infiltrate. Electronically Signed   By: Rolm Baptise M.D.   On: 04/10/2021 11:31   ECHOCARDIOGRAM COMPLETE  Result Date: 04/10/2021    ECHOCARDIOGRAM REPORT   Patient Name:   Earl Castillo Date of Exam: 04/10/2021 Medical Rec #:  [id number]       Height:       66.0 in Accession #:    [id number]      Weight:       130.1  lb Date of Birth:  1940/11/24        BSA:          1.666 m Patient Age:    39 years        BP:           176/60 mmHg Patient Gender: M               HR:           51 bpm. Exam Location:  Inpatient Procedure: 2D Echo, Color Doppler and Cardiac Doppler Indications:    Congestive Heart Failure I50.9  History:        Patient has prior history of Echocardiogram examinations, most                 recent 08/02/2013. CAD, Stroke; Risk Factors:Hypertension and                 Dyslipidemia.  Sonographer:    Bernadene Person RDCS Referring Phys: [id number] Sabina  1. Left ventricular ejection fraction, by estimation, is 65 to 70%. The left ventricle has normal function. The left ventricle has no regional wall motion abnormalities. Left ventricular diastolic parameters were normal.  2. Right ventricular systolic function is normal. The right ventricular size is normal. There is normal pulmonary artery systolic pressure.  3. The mitral valve is normal in structure. No evidence of mitral valve regurgitation. No evidence of mitral stenosis.  4. The aortic valve is normal in structure. Aortic valve regurgitation is not visualized. Mild aortic valve sclerosis is present, with no evidence of aortic valve stenosis.  5. The inferior vena cava is normal in size with greater than 50% respiratory variability, suggesting right atrial pressure of 3 mmHg. FINDINGS  Left Ventricle: Left ventricular ejection fraction, by estimation, is 65 to 70%. The left ventricle has normal function. The left ventricle has no regional wall motion abnormalities. The left ventricular internal cavity size was normal in size. There is  no left ventricular hypertrophy. Left ventricular diastolic parameters were normal. Normal left ventricular filling pressure. Right Ventricle: The right ventricular size is normal. No increase in right ventricular wall thickness. Right ventricular systolic function is normal. There is normal pulmonary artery systolic  pressure. The tricuspid regurgitant velocity is 2.63 m/s, and  with an assumed right atrial pressure of 3 mmHg, the estimated right ventricular systolic pressure is 70.3 mmHg. Left Atrium: Left atrial size was normal in size. Right Atrium: Right atrial size was normal in size. Pericardium: There is no evidence of pericardial effusion. Mitral Valve: The mitral valve is normal in structure. No evidence of mitral valve regurgitation. No evidence of mitral valve stenosis. Tricuspid Valve: The tricuspid valve is normal in structure. Tricuspid valve regurgitation is trivial. No evidence of tricuspid stenosis. Aortic Valve: The aortic valve is normal in structure. Aortic valve regurgitation is not  visualized. Mild aortic valve sclerosis is present, with no evidence of aortic valve stenosis. Pulmonic Valve: The pulmonic valve was normal in structure. Pulmonic valve regurgitation is not visualized. No evidence of pulmonic stenosis. Aorta: The aortic root is normal in size and structure. Venous: The inferior vena cava is normal in size with greater than 50% respiratory variability, suggesting right atrial pressure of 3 mmHg. IAS/Shunts: No atrial level shunt detected by color flow Doppler.  LEFT VENTRICLE PLAX 2D LVIDd:         4.20 cm  Diastology LVIDs:         2.90 cm  LV e' medial:    4.95 cm/s LV PW:         1.00 cm  LV E/e' medial:  12.5 LV IVS:        0.90 cm  LV e' lateral:   6.98 cm/s LVOT diam:     2.10 cm  LV E/e' lateral: 8.9 LV SV:         53 LV SV Index:   32 LVOT Area:     3.46 cm  RIGHT VENTRICLE RV S prime:     7.60 cm/s TAPSE (M-mode): 1.2 cm LEFT ATRIUM             Index       RIGHT ATRIUM          Index LA diam:        2.60 cm 1.56 cm/m  RA Area:     6.95 cm LA Vol (A2C):   44.2 ml 26.54 ml/m RA Volume:   11.80 ml 7.08 ml/m LA Vol (A4C):   48.9 ml 29.36 ml/m LA Biplane Vol: 47.4 ml 28.46 ml/m  AORTIC VALVE LVOT Vmax:   76.00 cm/s LVOT Vmean:  43.700 cm/s LVOT VTI:    0.152 m  AORTA Ao Root diam: 3.50  cm Ao Asc diam:  3.40 cm MITRAL VALVE               TRICUSPID VALVE MV Area (PHT): 2.91 cm    TR Peak grad:   27.7 mmHg MV Decel Time: 261 msec    TR Vmax:        263.00 cm/s MV E velocity: 61.80 cm/s MV A velocity: 57.60 cm/s  SHUNTS MV E/A ratio:  1.07        Systemic VTI:  0.15 m                            Systemic Diam: 2.10 cm Dani Gobble Croitoru MD Electronically signed by Sanda Klein MD Signature Date/Time: 04/10/2021/2:38:52 PM    Final    VAS US CAROTID  Result Date: 03/19/2021 Carotid Arterial Duplex Study Patient Name:  Earl Castillo  Date of Exam:   03/19/2021 Medical Rec #: [id number]        Accession #:    [id number] Date of Birth: Jul 21, 1941         Patient Gender: M Patient Age:   79Y [facility]am [facility] Procedure:      VAS US CAROTID Referring Phys: [id number] Serafina [place] --------------------------------------------------------------------------------  Indications:       Carotid artery disease. Risk Factors:      Hypertension, hyperlipidemia, current smoker, coronary artery                    disease. Other Factors:     Right carotid endarterectomy in 2006. Comparison  Study:  05/24/2020: Rt ICA 1-39%; Lt ICA 60-79%. ECA >50% Performing Technologist: Ivan Croft  Examination Guidelines: A complete evaluation includes B-mode imaging, spectral Doppler, color Doppler, and power Doppler as needed of all accessible portions of each vessel. Bilateral testing is considered an integral part of a complete examination. Limited examinations for reoccurring indications may be performed as noted.  Right Carotid Findings: +----------+--------+--------+--------+------------------+--------+           PSV cm/sEDV cm/sStenosisPlaque DescriptionComments +----------+--------+--------+--------+------------------+--------+ CCA Prox  203     21              heterogenous               +----------+--------+--------+--------+------------------+--------+ CCA Mid   225     14               heterogenous               +----------+--------+--------+--------+------------------+--------+ CCA Distal240     23              heterogenous               +----------+--------+--------+--------+------------------+--------+ ICA Prox  175     21      1-39%   heterogenous               +----------+--------+--------+--------+------------------+--------+ ICA Mid   153     25              heterogenous               +----------+--------+--------+--------+------------------+--------+ ICA Distal102     18                                         +----------+--------+--------+--------+------------------+--------+ ECA       228     15              heterogenous               +----------+--------+--------+--------+------------------+--------+ +----------+--------+-------+---------+-------------------+           PSV cm/sEDV cmsDescribe Arm Pressure (mmHG) +----------+--------+-------+---------+-------------------+ Subclavian226            Turbulent                    +----------+--------+-------+---------+-------------------+ +---------+--------+--+--------+--+---------+ VertebralPSV cm/s89EDV cm/s15Antegrade +---------+--------+--+--------+--+---------+  Left Carotid Findings: +----------+--------+--------+--------+-------------------------+--------+           PSV cm/sEDV cm/sStenosisPlaque Description       Comments +----------+--------+--------+--------+-------------------------+--------+ CCA Prox  84      7               heterogenous and calcific         +----------+--------+--------+--------+-------------------------+--------+ CCA Mid   106     10              heterogenous and calcific         +----------+--------+--------+--------+-------------------------+--------+ CCA Distal133     14              heterogenous and calcific         +----------+--------+--------+--------+-------------------------+--------+ ICA Prox  351     81       60-79%  heterogenous and calcific         +----------+--------+--------+--------+-------------------------+--------+ ICA Mid   165     26              heterogenous and calcific         +----------+--------+--------+--------+-------------------------+--------+  ICA Distal70      14                                                +----------+--------+--------+--------+-------------------------+--------+ ECA       347     8       >50%    heterogenous and calcific         +----------+--------+--------+--------+-------------------------+--------+ +----------+--------+--------+----------------+-------------------+           PSV cm/sEDV cm/sDescribe        Arm Pressure (mmHG) +----------+--------+--------+----------------+-------------------+ [id number]             Multiphasic, WNL                    +----------+--------+--------+----------------+-------------------+ +---------+--------+--+--------+--+---------+ VertebralPSV cm/s69EDV cm/s18Antegrade +---------+--------+--+--------+--+---------+   Summary: Right Carotid: Velocities in the right ICA are consistent with a 1-39% stenosis.                Non-hemodynamically significant plaque <50% noted in the CCA. Left Carotid: Velocities in the left ICA are consistent with a 60-79% stenosis.               Non-hemodynamically significant plaque <50% noted in the CCA. The               ECA appears >50% stenosed. Vertebrals:  Bilateral vertebral arteries demonstrate antegrade flow. Subclavians: Right subclavian artery flow was disturbed. *See table(s) above for measurements and observations.  Electronically signed by Harold Barban MD on 03/19/2021 at 8:53:23 PM.    Final       Subjective: Patient was seen and examined at bedside.  Overnight events noted.  Patient reports feeling much improved.   Patient has participated in physical therapy and wants to be discharged back to skilled nursing facility for rehab.  Discharge  Exam: Vitals:   04/15/21 2047 04/16/21 0524  BP: (!) 138/51 (!) 150/61  Pulse: 61 60  Resp: 18 18  Temp: 98.1 F (36.7 C) 97.9 F (36.6 C)  SpO2: 93% 98%   Vitals:   04/15/21 1248 04/15/21 2047 04/16/21 0500 04/16/21 0524  BP: (!) 139/58 (!) 138/51  (!) 150/61  Pulse: (!) 54 61  60  Resp: 18 18  18   Temp: 98 F (36.7 C) 98.1 F (36.7 C)  97.9 F (36.6 C)  TempSrc: Oral Oral  Oral  SpO2: 96% 93%  98%  Weight:   50.9 kg   Height:        General: Pt is alert, awake, not in acute distress Cardiovascular: RRR, S1/S2 +, no rubs, no gallops Respiratory: CTA bilaterally, no wheezing, no rhonchi Abdominal: Soft, NT, ND, bowel sounds + Extremities: no edema, no cyanosis    The results of significant diagnostics from this hospitalization (including imaging, microbiology, ancillary and laboratory) are listed below for reference.     Microbiology: Recent Results (from the past 240 hour(s))  Resp Panel by RT-PCR (Flu A&B, Covid) Nasopharyngeal Swab     Status: None   Collection Time: 04/09/21 11:02 AM   Specimen: Nasopharyngeal Swab; Nasopharyngeal(NP) swabs in vial transport medium  Result Value Ref Range Status   SARS Coronavirus 2 by RT PCR NEGATIVE NEGATIVE Final    Comment: (NOTE) SARS-CoV-2 target nucleic acids are NOT DETECTED.  The SARS-CoV-2 RNA is generally detectable in upper respiratory specimens during the acute phase of infection. The lowest  concentration of SARS-CoV-2 viral copies this assay can detect is 138 copies/mL. A negative result does not preclude SARS-Cov-2 infection and should not be used as the sole basis for treatment or other patient management decisions. A negative result may occur with  improper specimen collection/handling, submission of specimen other than nasopharyngeal swab, presence of viral mutation(s) within the areas targeted by this assay, and inadequate number of viral copies(<138 copies/mL). A negative result must be combined  with clinical observations, patient history, and epidemiological information. The expected result is Negative.  Fact Sheet for Patients:  EntrepreneurPulse.com.au  Fact Sheet for Healthcare Providers:  IncredibleEmployment.be  This test is no t yet approved or cleared by the [place] FDA and  has been authorized for detection and/or diagnosis of SARS-CoV-2 by FDA under an Emergency Use Authorization (EUA). This EUA will remain  in effect (meaning this test can be used) for the duration of the COVID-19 declaration under Section 564(b)(1) of the Act, 21 U.S.C.section 360bbb-3(b)(1), unless the authorization is terminated  or revoked sooner.       Influenza A by PCR NEGATIVE NEGATIVE Final   Influenza B by PCR NEGATIVE NEGATIVE Final    Comment: (NOTE) The Xpert Xpress SARS-CoV-2/FLU/RSV plus assay is intended as an aid in the diagnosis of influenza from Nasopharyngeal swab specimens and should not be used as a sole basis for treatment. Nasal washings and aspirates are unacceptable for Xpert Xpress SARS-CoV-2/FLU/RSV testing.  Fact Sheet for Patients: EntrepreneurPulse.com.au  Fact Sheet for Healthcare Providers: IncredibleEmployment.be  This test is not yet approved or cleared by the [place] FDA and has been authorized for detection and/or diagnosis of SARS-CoV-2 by FDA under an Emergency Use Authorization (EUA). This EUA will remain in effect (meaning this test can be used) for the duration of the COVID-19 declaration under Section 564(b)(1) of the Act, 21 U.S.C. section 360bbb-3(b)(1), unless the authorization is terminated or revoked.  Performed at [facility], [place] [address] [place], [place] [address]   Resp Panel by RT-PCR (Flu A&B, Covid) Nasopharyngeal Swab     Status: None   Collection Time: 04/14/21 12:39 PM   Specimen: Nasopharyngeal Swab; Nasopharyngeal(NP)  swabs in vial transport medium  Result Value Ref Range Status   SARS Coronavirus 2 by RT PCR NEGATIVE NEGATIVE Final    Comment: (NOTE) SARS-CoV-2 target nucleic acids are NOT DETECTED.  The SARS-CoV-2 RNA is generally detectable in upper respiratory specimens during the acute phase of infection. The lowest concentration of SARS-CoV-2 viral copies this assay can detect is 138 copies/mL. A negative result does not preclude SARS-Cov-2 infection and should not be used as the sole basis for treatment or other patient management decisions. A negative result may occur with  improper specimen collection/handling, submission of specimen other than nasopharyngeal swab, presence of viral mutation(s) within the areas targeted by this assay, and inadequate number of viral copies(<138 copies/mL). A negative result must be combined with clinical observations, patient history, and epidemiological information. The expected result is Negative.  Fact Sheet for Patients:  EntrepreneurPulse.com.au  Fact Sheet for Healthcare Providers:  IncredibleEmployment.be  This test is no t yet approved or cleared by the [place] FDA and  has been authorized for detection and/or diagnosis of SARS-CoV-2 by FDA under an Emergency Use Authorization (EUA). This EUA will remain  in effect (meaning this test can be used) for the duration of the COVID-19 declaration under Section 564(b)(1) of the Act, 21 U.S.C.section 360bbb-3(b)(1), unless the authorization is terminated  or revoked sooner.       Influenza A by PCR NEGATIVE NEGATIVE Final   Influenza B by PCR NEGATIVE NEGATIVE Final    Comment: (NOTE) The Xpert Xpress SARS-CoV-2/FLU/RSV plus assay is intended as an aid in the diagnosis of influenza from Nasopharyngeal swab specimens and should not be used as a sole basis for treatment. Nasal washings and aspirates are unacceptable for Xpert Xpress  SARS-CoV-2/FLU/RSV testing.  Fact Sheet for Patients: EntrepreneurPulse.com.au  Fact Sheet for Healthcare Providers: IncredibleEmployment.be  This test is not yet approved or cleared by the [place] FDA and has been authorized for detection and/or diagnosis of SARS-CoV-2 by FDA under an Emergency Use Authorization (EUA). This EUA will remain in effect (meaning this test can be used) for the duration of the COVID-19 declaration under Section 564(b)(1) of the Act, 21 U.S.C. section 360bbb-3(b)(1), unless the authorization is terminated or revoked.  Performed at [facility], [place] [address], [place], [place] [address]   Resp Panel by RT-PCR (Flu A&B, Covid) Nasopharyngeal Swab     Status: None   Collection Time: 04/15/21  2:06 PM   Specimen: Nasopharyngeal Swab; Nasopharyngeal(NP) swabs in vial transport medium  Result Value Ref Range Status   SARS Coronavirus 2 by RT PCR NEGATIVE NEGATIVE Final    Comment: (NOTE) SARS-CoV-2 target nucleic acids are NOT DETECTED.  The SARS-CoV-2 RNA is generally detectable in upper respiratory specimens during the acute phase of infection. The lowest concentration of SARS-CoV-2 viral copies this assay can detect is 138 copies/mL. A negative result does not preclude SARS-Cov-2 infection and should not be used as the sole basis for treatment or other patient management decisions. A negative result may occur with  improper specimen collection/handling, submission of specimen other than nasopharyngeal swab, presence of viral mutation(s) within the areas targeted by this assay, and inadequate number of viral copies(<138 copies/mL). A negative result must be combined with clinical observations, patient history, and epidemiological information. The expected result is Negative.  Fact Sheet for Patients:  EntrepreneurPulse.com.au  Fact Sheet for Healthcare Providers:   IncredibleEmployment.be  This test is no t yet approved or cleared by the [place] FDA and  has been authorized for detection and/or diagnosis of SARS-CoV-2 by FDA under an Emergency Use Authorization (EUA). This EUA will remain  in effect (meaning this test can be used) for the duration of the COVID-19 declaration under Section 564(b)(1) of the Act, 21 U.S.C.section 360bbb-3(b)(1), unless the authorization is terminated  or revoked sooner.       Influenza A by PCR NEGATIVE NEGATIVE Final   Influenza B by PCR NEGATIVE NEGATIVE Final    Comment: (NOTE) The Xpert Xpress SARS-CoV-2/FLU/RSV plus assay is intended as an aid in the diagnosis of influenza from Nasopharyngeal swab specimens and should not be used as a sole basis for treatment. Nasal washings and aspirates are unacceptable for Xpert Xpress SARS-CoV-2/FLU/RSV testing.  Fact Sheet for Patients: EntrepreneurPulse.com.au  Fact Sheet for Healthcare Providers: IncredibleEmployment.be  This test is not yet approved or cleared by the [place] FDA and has been authorized for detection and/or diagnosis of SARS-CoV-2 by FDA under an Emergency Use Authorization (EUA). This EUA will remain in effect (meaning this test can be used) for the duration of the COVID-19 declaration under Section 564(b)(1) of the Act, 21 U.S.C. section 360bbb-3(b)(1), unless the authorization is terminated or revoked.  Performed at [facility], [place] [address] [place], [place] [address]      Labs:  BNP (last 3 results) Recent Labs    04/09/21 1102 04/13/21 0456  BNP 622.8* 19.1   Basic Metabolic Panel: Recent Labs  Lab 04/11/21 0525 04/12/21 0510 04/13/21 0456 04/14/21 0528 04/15/21 0524 04/16/21 0504  NA 138 136 137 137 136 136  K 3.7 3.4* 4.0 3.8 4.2 4.5  CL 97* 94* 97* 96* 97* 98  CO2 31 33* 31 32 31 31  GLUCOSE 116* 113* 106* 107* 102* 101*  BUN  32* 38* 37* 41* 44* 43*  CREATININE 1.53* 1.73* 1.50* 1.51* 1.69* 1.42*  CALCIUM 8.5* 8.5* 8.4* 9.3 9.3 9.2  MG 2.1  --   --  2.6*  --   --   PHOS  --   --   --  4.0  --   --    Liver Function Tests: Recent Labs  Lab 04/10/21 0230  AST 28  ALT 9  ALKPHOS 47  BILITOT 1.6*  PROT 6.8  ALBUMIN 2.9*   No results for input(s): LIPASE, AMYLASE in the last 168 hours. No results for input(s): AMMONIA in the last 168 hours. CBC: Recent Labs  Lab 04/10/21 1509 04/11/21 0525 04/12/21 0510 04/13/21 0456 04/14/21 0528  WBC 8.1 9.0 8.5 9.0 8.3  HGB 11.4* 11.3* 11.5* 11.3* 11.4*  HCT 35.1* 35.5* 35.5* 36.0* 35.9*  MCV 102.0* 102.3* 100.6* 104.7* 103.5*  PLT 242 249 250 244 281   Cardiac Enzymes: No results for input(s): CKTOTAL, CKMB, CKMBINDEX, TROPONINI in the last 168 hours. BNP: Invalid input(s): POCBNP CBG: No results for input(s): GLUCAP in the last 168 hours. D-Dimer No results for input(s): DDIMER in the last 72 hours. Hgb A1c No results for input(s): HGBA1C in the last 72 hours. Lipid Profile No results for input(s): CHOL, HDL, LDLCALC, TRIG, CHOLHDL, LDLDIRECT in the last 72 hours. Thyroid function studies No results for input(s): TSH, T4TOTAL, T3FREE, THYROIDAB in the last 72 hours.  Invalid input(s): FREET3 Anemia work up No results for input(s): VITAMINB12, FOLATE, FERRITIN, TIBC, IRON, RETICCTPCT in the last 72 hours. Urinalysis    Component Value Date/Time   COLORURINE STRAW (A) 04/10/2021 0636   APPEARANCEUR CLEAR 04/10/2021 0636   LABSPEC 1.011 04/10/2021 0636   PHURINE 5.0 04/10/2021 0636   GLUCOSEU NEGATIVE 04/10/2021 0636   HGBUR SMALL (A) 04/10/2021 0636   BILIRUBINUR NEGATIVE 04/10/2021 0636   BILIRUBINUR neg 12/23/2018 1136   KETONESUR NEGATIVE 04/10/2021 0636   PROTEINUR 100 (A) 04/10/2021 0636   UROBILINOGEN 0.2 12/23/2018 1136   NITRITE NEGATIVE 04/10/2021 0636   LEUKOCYTESUR NEGATIVE 04/10/2021 0636   Sepsis Labs Invalid input(s):  PROCALCITONIN,  WBC,  LACTICIDVEN Microbiology Recent Results (from the past 240 hour(s))  Resp Panel by RT-PCR (Flu A&B, Covid) Nasopharyngeal Swab     Status: None   Collection Time: 04/09/21 11:02 AM   Specimen: Nasopharyngeal Swab; Nasopharyngeal(NP) swabs in vial transport medium  Result Value Ref Range Status   SARS Coronavirus 2 by RT PCR NEGATIVE NEGATIVE Final    Comment: (NOTE) SARS-CoV-2 target nucleic acids are NOT DETECTED.  The SARS-CoV-2 RNA is generally detectable in upper respiratory specimens during the acute phase of infection. The lowest concentration of SARS-CoV-2 viral copies this assay can detect is 138 copies/mL. A negative result does not preclude SARS-Cov-2 infection and should not be used as the sole basis for treatment or other patient management decisions. A negative result may occur with  improper specimen collection/handling, submission of specimen other than nasopharyngeal swab, presence of viral mutation(s) within the areas targeted  by this assay, and inadequate number of viral copies(<138 copies/mL). A negative result must be combined with clinical observations, patient history, and epidemiological information. The expected result is Negative.  Fact Sheet for Patients:  EntrepreneurPulse.com.au  Fact Sheet for Healthcare Providers:  IncredibleEmployment.be  This test is no t yet approved or cleared by the [place] FDA and  has been authorized for detection and/or diagnosis of SARS-CoV-2 by FDA under an Emergency Use Authorization (EUA). This EUA will remain  in effect (meaning this test can be used) for the duration of the COVID-19 declaration under Section 564(b)(1) of the Act, 21 U.S.C.section 360bbb-3(b)(1), unless the authorization is terminated  or revoked sooner.       Influenza A by PCR NEGATIVE NEGATIVE Final   Influenza B by PCR NEGATIVE NEGATIVE Final    Comment: (NOTE) The Xpert Xpress  SARS-CoV-2/FLU/RSV plus assay is intended as an aid in the diagnosis of influenza from Nasopharyngeal swab specimens and should not be used as a sole basis for treatment. Nasal washings and aspirates are unacceptable for Xpert Xpress SARS-CoV-2/FLU/RSV testing.  Fact Sheet for Patients: EntrepreneurPulse.com.au  Fact Sheet for Healthcare Providers: IncredibleEmployment.be  This test is not yet approved or cleared by the [place] FDA and has been authorized for detection and/or diagnosis of SARS-CoV-2 by FDA under an Emergency Use Authorization (EUA). This EUA will remain in effect (meaning this test can be used) for the duration of the COVID-19 declaration under Section 564(b)(1) of the Act, 21 U.S.C. section 360bbb-3(b)(1), unless the authorization is terminated or revoked.  Performed at [facility], [place] [address], [place], [place] [address]   Resp Panel by RT-PCR (Flu A&B, Covid) Nasopharyngeal Swab     Status: None   Collection Time: 04/14/21 12:39 PM   Specimen: Nasopharyngeal Swab; Nasopharyngeal(NP) swabs in vial transport medium  Result Value Ref Range Status   SARS Coronavirus 2 by RT PCR NEGATIVE NEGATIVE Final    Comment: (NOTE) SARS-CoV-2 target nucleic acids are NOT DETECTED.  The SARS-CoV-2 RNA is generally detectable in upper respiratory specimens during the acute phase of infection. The lowest concentration of SARS-CoV-2 viral copies this assay can detect is 138 copies/mL. A negative result does not preclude SARS-Cov-2 infection and should not be used as the sole basis for treatment or other patient management decisions. A negative result may occur with  improper specimen collection/handling, submission of specimen other than nasopharyngeal swab, presence of viral mutation(s) within the areas targeted by this assay, and inadequate number of viral copies(<138 copies/mL). A negative result must be  combined with clinical observations, patient history, and epidemiological information. The expected result is Negative.  Fact Sheet for Patients:  EntrepreneurPulse.com.au  Fact Sheet for Healthcare Providers:  IncredibleEmployment.be  This test is no t yet approved or cleared by the [place] FDA and  has been authorized for detection and/or diagnosis of SARS-CoV-2 by FDA under an Emergency Use Authorization (EUA). This EUA will remain  in effect (meaning this test can be used) for the duration of the COVID-19 declaration under Section 564(b)(1) of the Act, 21 U.S.C.section 360bbb-3(b)(1), unless the authorization is terminated  or revoked sooner.       Influenza A by PCR NEGATIVE NEGATIVE Final   Influenza B by PCR NEGATIVE NEGATIVE Final    Comment: (NOTE) The Xpert Xpress SARS-CoV-2/FLU/RSV plus assay is intended as an aid in the diagnosis of influenza from Nasopharyngeal swab specimens and should not be used as a sole basis for treatment.  Nasal washings and aspirates are unacceptable for Xpert Xpress SARS-CoV-2/FLU/RSV testing.  Fact Sheet for Patients: EntrepreneurPulse.com.au  Fact Sheet for Healthcare Providers: IncredibleEmployment.be  This test is not yet approved or cleared by the [place] FDA and has been authorized for detection and/or diagnosis of SARS-CoV-2 by FDA under an Emergency Use Authorization (EUA). This EUA will remain in effect (meaning this test can be used) for the duration of the COVID-19 declaration under Section 564(b)(1) of the Act, 21 U.S.C. section 360bbb-3(b)(1), unless the authorization is terminated or revoked.  Performed at [facility], [place] [address] [place], [place] [address]   Resp Panel by RT-PCR (Flu A&B, Covid) Nasopharyngeal Swab     Status: None   Collection Time: 04/15/21  2:06 PM   Specimen: Nasopharyngeal Swab;  Nasopharyngeal(NP) swabs in vial transport medium  Result Value Ref Range Status   SARS Coronavirus 2 by RT PCR NEGATIVE NEGATIVE Final    Comment: (NOTE) SARS-CoV-2 target nucleic acids are NOT DETECTED.  The SARS-CoV-2 RNA is generally detectable in upper respiratory specimens during the acute phase of infection. The lowest concentration of SARS-CoV-2 viral copies this assay can detect is 138 copies/mL. A negative result does not preclude SARS-Cov-2 infection and should not be used as the sole basis for treatment or other patient management decisions. A negative result may occur with  improper specimen collection/handling, submission of specimen other than nasopharyngeal swab, presence of viral mutation(s) within the areas targeted by this assay, and inadequate number of viral copies(<138 copies/mL). A negative result must be combined with clinical observations, patient history, and epidemiological information. The expected result is Negative.  Fact Sheet for Patients:  EntrepreneurPulse.com.au  Fact Sheet for Healthcare Providers:  IncredibleEmployment.be  This test is no t yet approved or cleared by the [place] FDA and  has been authorized for detection and/or diagnosis of SARS-CoV-2 by FDA under an Emergency Use Authorization (EUA). This EUA will remain  in effect (meaning this test can be used) for the duration of the COVID-19 declaration under Section 564(b)(1) of the Act, 21 U.S.C.section 360bbb-3(b)(1), unless the authorization is terminated  or revoked sooner.       Influenza A by PCR NEGATIVE NEGATIVE Final   Influenza B by PCR NEGATIVE NEGATIVE Final    Comment: (NOTE) The Xpert Xpress SARS-CoV-2/FLU/RSV plus assay is intended as an aid in the diagnosis of influenza from Nasopharyngeal swab specimens and should not be used as a sole basis for treatment. Nasal washings and aspirates are unacceptable for Xpert Xpress  SARS-CoV-2/FLU/RSV testing.  Fact Sheet for Patients: EntrepreneurPulse.com.au  Fact Sheet for Healthcare Providers: IncredibleEmployment.be  This test is not yet approved or cleared by the [place] FDA and has been authorized for detection and/or diagnosis of SARS-CoV-2 by FDA under an Emergency Use Authorization (EUA). This EUA will remain in effect (meaning this test can be used) for the duration of the COVID-19 declaration under Section 564(b)(1) of the Act, 21 U.S.C. section 360bbb-3(b)(1), unless the authorization is terminated or revoked.  Performed at [facility], [place] [address], [place], [place] [address]      Time coordinating discharge: Over 30 minutes  SIGNED:   Shawna Clamp, MD  [facility] 04/16/2021, 11:57 AM Pager   If 7PM-7AM, please contact night-coverage [email or web address]

## 2021-04-16 NOTE — TOC Transition Note (Addendum)
Transition of Care [facility]) - CM/SW Discharge Note   Patient Details  Name: Earl Castillo MRN: [id number] Date of Birth: 05-04-1941  Transition of Care [facility] - ) CM/SW Contact:  Dessa Phi, RN Phone Number: 04/16/2021, 11:48 AM   Clinical Narrative:  D/c to [facility] today rep Elyse Hsu can accept-going to rm#[id number] tel# for report [phone], awaiting d/c summary prior calling [facility].  12p-correction on tel# for report to call for [phone]. [facility] called. No further CM needs.   Final next level of care: Skilled Nursing Facility Barriers to Discharge: No Barriers Identified   Patient Goals and CMS Choice Patient states their goals for this hospitalization and ongoing recovery are:: go to rehab CMS Medicare.gov Compare Post Acute Care list provided to:: Patient Choice offered to / list presented to : Patient  Discharge Placement              Patient chooses bed at: [facility] Patient to be transferred to facility by: [place] Name of family member notified: [name] son [phone] Patient and family notified of of transfer: 04/16/21  Discharge Plan and Services     Post Acute Care Choice: Home Health                    HH Arranged: PT, RN [facility] Agency: [place] ([facility]) Date HH Agency Contacted: 04/12/21 Time HH Agency Contacted: 43 Representative spoke with at [place]: [place] ([place]) Interventions     Readmission Risk Interventions No flowsheet data found.

## 2021-04-16 NOTE — Plan of Care (Signed)
  Problem: Nutrition: Goal: Adequate nutrition will be maintained Outcome: Progressing   Problem: Safety: Goal: Ability to remain free from injury will improve Outcome: Progressing   Problem: Health Behavior/Discharge Planning: Goal: Ability to manage health-related needs will improve Outcome: Not Progressing   Problem: Pain Managment: Goal: General experience of comfort will improve Outcome: Adequate for Discharge

## 2021-04-17 ENCOUNTER — Other Ambulatory Visit: Payer: Self-pay

## 2021-04-18 DIAGNOSIS — I509 Heart failure, unspecified: Secondary | ICD-10-CM | POA: Diagnosis not present

## 2021-04-18 DIAGNOSIS — J9601 Acute respiratory failure with hypoxia: Secondary | ICD-10-CM | POA: Diagnosis not present

## 2021-04-18 DIAGNOSIS — R0989 Other specified symptoms and signs involving the circulatory and respiratory systems: Secondary | ICD-10-CM | POA: Diagnosis not present

## 2021-04-18 DIAGNOSIS — I739 Peripheral vascular disease, unspecified: Secondary | ICD-10-CM | POA: Diagnosis not present

## 2021-04-18 DIAGNOSIS — I10 Essential (primary) hypertension: Secondary | ICD-10-CM | POA: Diagnosis not present

## 2021-04-18 DIAGNOSIS — D508 Other iron deficiency anemias: Secondary | ICD-10-CM | POA: Diagnosis not present

## 2021-04-18 DIAGNOSIS — R5381 Other malaise: Secondary | ICD-10-CM | POA: Diagnosis not present

## 2021-04-18 DIAGNOSIS — I2510 Atherosclerotic heart disease of native coronary artery without angina pectoris: Secondary | ICD-10-CM | POA: Diagnosis not present

## 2021-04-18 DIAGNOSIS — E538 Deficiency of other specified B group vitamins: Secondary | ICD-10-CM | POA: Diagnosis not present

## 2021-04-18 DIAGNOSIS — N179 Acute kidney failure, unspecified: Secondary | ICD-10-CM | POA: Diagnosis not present

## 2021-04-19 DIAGNOSIS — I509 Heart failure, unspecified: Secondary | ICD-10-CM | POA: Diagnosis not present

## 2021-04-19 DIAGNOSIS — I2510 Atherosclerotic heart disease of native coronary artery without angina pectoris: Secondary | ICD-10-CM | POA: Diagnosis not present

## 2021-04-19 DIAGNOSIS — R5381 Other malaise: Secondary | ICD-10-CM | POA: Diagnosis not present

## 2021-04-19 DIAGNOSIS — R0989 Other specified symptoms and signs involving the circulatory and respiratory systems: Secondary | ICD-10-CM | POA: Diagnosis not present

## 2021-04-19 DIAGNOSIS — I10 Essential (primary) hypertension: Secondary | ICD-10-CM | POA: Diagnosis not present

## 2021-04-19 DIAGNOSIS — I739 Peripheral vascular disease, unspecified: Secondary | ICD-10-CM | POA: Diagnosis not present

## 2021-04-19 DIAGNOSIS — J9601 Acute respiratory failure with hypoxia: Secondary | ICD-10-CM | POA: Diagnosis not present

## 2021-05-01 ENCOUNTER — Inpatient Hospital Stay (HOSPITAL_COMMUNITY): Payer: Medicare Other | Admitting: Anesthesiology

## 2021-05-01 ENCOUNTER — Encounter (HOSPITAL_COMMUNITY): Payer: Self-pay | Admitting: Surgery

## 2021-05-01 ENCOUNTER — Other Ambulatory Visit: Payer: Self-pay

## 2021-05-01 NOTE — Progress Notes (Signed)
Anesthesia Chart Review: SAME DAY WORK-UP  Case: [id number] Date/Time: 05/02/21 0715   Procedures:      LEFT TRANSCAROTID ARTERY REVASCULARIZATION (Left)     LEFT CAROTID ENDARTERECTOMY (Left)   Anesthesia type: General   Pre-op diagnosis: Left Carotid Artery Lesion   Location: [facility] OR ROOM 16 / [place] [facility]   Surgeons: Serafina Mitchell, MD       DISCUSSION: Patient is a 80 year old male scheduled for the above procedure. He lives in a SNF [facility]) and is a same-day work-up.   History includes smoking, CAD (s/p CABG: LIMA-dLAD, RIMA-OM1, SVG-dRCA 86/76/19), diastolic CHF, PAD (left-to-right FFBG 03/27/01; atherectomy/angioplasty/stenting left SFA 05/23/14; s/p left femoral-above knee popliteal artery bypass using 6 mm PTFE 08/09/16), carotid artery disease (s/p right carotid endarterectomy 03/26/05), HLD, GERD, IDA (secondary to small bowel angiodysplasia 2014), skin cancer ([place] excision 2009), CVA. 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 Zadie Rhine.  Last cardiology visit with [name] on 12/26/20.  He referred Mr. Stangelo back to vascular surgery for further evaluation given recent left ocular stroke.  As of 03/27/21, cardiologist [name] felt patient could proceed without functional testing preoperatively. Patient subsequently had echo 04/10/21 showing normal LVEF and wall motion during hospital admission for acute diastolic CHF following recent carotid angiography, Creatinine peaked 1.73.   - Admission 04/09/21-04/16/21 for multiple falls (known recurrent falls even prior to carotid angiography), acute hypoxic respiratory failure secondary to diastolic CHF. (S/p carotid angiography on 04/06/21 with planned left CCA endarterectomy and left TCAR.)  BNP 622.8. Initially required O2/[place]. Diureses on IV Lasix. ARB held due to AKI. CXR showed improved aeration after diuresis, and antibiotics discontinued. No acute infarct on head CT. No PE on CTA. Echo showed  LVEF 60-65%, no regional wall motion abnormalities. Vascular surgery was consulted with plan to not schedule carotid revascularization until acute illness over. Continue ASA/Plavix and statin with out-patient follow-up. Discharged to [facility] for ongoing rehab with OT/PT.   He is a same day work-up, so anesthesia team to evaluate on the day of surgery. He has labs ordered by vascular surgeon. He is on ASA and Plavix.   VS:  BP Readings from Last 3 Encounters:  04/16/21 (!) 150/61  04/06/21 (!) 178/59  03/26/21 (!) 202/65   Pulse Readings from Last 3 Encounters:  04/16/21 60  04/06/21 74  03/26/21 66     PROVIDERS: [name], MD is PCP  Quay Burow, MD is cardiologist Deloria Lair, MD is Retinal Specialist  LABS: Most recent lab results include: Lab Results  Component Value Date   WBC 8.3 04/14/2021   HGB 11.4 (L) 04/14/2021   HCT 35.9 (L) 04/14/2021   PLT 281 04/14/2021   GLUCOSE 101 (H) 04/16/2021   ALT 9 04/10/2021   AST 28 04/10/2021   NA 136 04/16/2021   K 4.5 04/16/2021   CL 98 04/16/2021   CREATININE 1.42 (H) 04/16/2021   BUN 43 (H) 04/16/2021   CO2 31 04/16/2021   INR 1.1 04/10/2021   Creatinine 1.42 and down from 1.69 on 04/15/21. Creatinine range 0.86-1.96 since 02/28/21 in [facility].    IMAGES: 1V PCXR 04/12/21: FINDINGS: - The heart size and mediastinal contours are unchanged. Aortic arch calcification. The large main pulmonary artery. - Biapical pleural/pulmonary scarring. No pulmonary edema. No pneumothorax. Persistent trace to small volume right and trace left pleural effusions. - No acute osseous abnormality. IMPRESSION: 1. Persistent trace to small volume right and trace  left pleural effusions. 2. Pulmonary hypertension.   CT Head 04/09/21: IMPRESSION: - No acute intracranial hemorrhage or infarct. - Moderate senescent change. Remote right thalamic and right parieto-occipital cortical infarcts. - Advanced intracranial atherosclerotic  disease.  CTA Chest 04/09/21: IMPRESSION: 1. Negative for pulmonary embolus. 2. Congestive heart failure with right greater than left pleural effusions. 3. Difficult to exclude superimposed pneumonia in the right upper and right lower lobes. 4. Aortic atherosclerosis (ICD10-I70.0). Coronary artery calcification. 5. Enlarged pulmonic trunk, indicative of pulmonary arterial hypertension. 6.  Emphysema (ICD10-J43.9). ADDENDUM: Initial report by Dr. Rosario Jacks, addendum by Dr. Jobe Igo. Upon further review, there is an acute right eleventh rib minimally displaced fracture, including on 157/4. There are remote bilateral lower rib fractures as well.   EKG: 04/09/21: Sinus rhythm Abnormal R-wave progression, early transition LVH with secondary repolarization abnormality Baseline wander Otherwise no significant change Confirmed by Fredia Sorrow [phone]) on 04/09/2021 11:21:18 AM  CV: Echo 04/10/21: IMPRESSIONS   1. Left ventricular ejection fraction, by estimation, is 65 to 70%. The  left ventricle has normal function. The left ventricle has no regional  wall motion abnormalities. Left ventricular diastolic parameters were  normal.   2. Right ventricular systolic function is normal. The right ventricular  size is normal. There is normal pulmonary artery systolic pressure.   3. The mitral valve is normal in structure. No evidence of mitral valve  regurgitation. No evidence of mitral stenosis.   4. The aortic valve is normal in structure. Aortic valve regurgitation is  not visualized. Mild aortic valve sclerosis is present, with no evidence  of aortic valve stenosis.   5. The inferior vena cava is normal in size with greater than 50%  respiratory variability, suggesting right atrial pressure of 3 mmHg.    Carotid Angiography 04/06/21: Operative findings:  1.  Severe calcific greater than 90% stenosis left internal carotid artery 2.  Type I aortic arch 3.  50% ostial stenosis left common  carotid artery   Carotid US 03/19/21: Summary:  - Right Carotid: Velocities in the right ICA are consistent with a 1-39% stenosis. Non-hemodynamically significant plaque <50% noted in the CCA.  - Left Carotid: Velocities in the left ICA are consistent with a 60-79% stenosis. Non-hemodynamically significant plaque <50% noted in the CCA. The ECA appears >50% stenosed.  - Vertebrals:  Bilateral vertebral arteries demonstrate antegrade flow.  - Subclavians: Right subclavian artery flow was disturbed.    Last known cardiac cath 08/18/03 pre-CABG.    Past Medical History:  Diagnosis Date   Anemia    Angiodysplasia of intestine - small bowel  09/21/2013   Blood transfusion abn reaction or complication, no procedure mishap    "HgB dropped real low"   Cancer ([place])    Skin cancers   Carotid artery disease ([place])    status post carotid endarterectomy by [name]   CHF (congestive heart failure) ([place]) 04/09/2021   new diagnosis   Chicken pox    Colon polyps 08/2012   Hyperplastic and adenomatous sessile polyps   Coronary artery disease    CABG'04, Low risk Myoview 2013   GERD (gastroesophageal reflux disease)    Hemorrhoid    History of blood transfusion 08/2013   "HgB dropped real low; related to bleeding from my rectum"   History of kidney stones    1 removed surgical, passed 2   Hyperlipidemia    Hypertension    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 08/17/2013  Lower GI bleeding 08/2013   Osteoarthritis    "hips" (05/23/2014)   Peripheral arterial disease ([place])    status post left to right fem-fem crossover graft in by [name]   Pneumonia    - "years ago"   Sinus headache    "alot" (05/23/2014)   Skin exam, screening for cancer    sees [name]    Stroke [facility])    Vitamin B12 deficiency 11/23/2013    Past Surgical History:  Procedure Laterality Date   BAND HEMORRHOIDECTOMY     BASAL CELL CARCINOMA EXCISION   May 2009   per [name] , left nose    basket extraction of ureteral stone  1970's   Bypass rt leg Right 2000   capsule endoscopy  10-05-13   per [name], 4 AVM's   CARDIAC CATHETERIZATION  08/18/2003   Recommend CABG   CARDIOVASCULAR STRESS TEST - LEXISCAN  08/25/2012   No significant wall motion abnormalities   CAROTID ANGIOGRAM N/A 03/17/2014   Procedure: CAROTID Cyril Loosen;  Surgeon: [name], MD;  Location: [facility] CATH LAB;  Service: Cardiovascular;  Laterality: N/A;  right carotid   CAROTID ANGIOGRAPHY N/A 04/06/2021   Procedure: CAROTID ANGIOGRAPHY;  Surgeon: Elam Dutch, MD;  Location: [place] CV LAB;  Service: Cardiovascular;  Laterality: N/A;   CAROTID ENDARTERECTOMY Right    CAROTID ENDARTERECTOMY Right 03/26/2005   Dr Georgina Quint   CATARACT EXTRACTION W/ INTRAOCULAR LENS  IMPLANT, BILATERAL Bilateral 2014   per [name]   CERVICAL DISC SURGERY  1978   "bulging disc"   COLONOSCOPY  08-12-12   per [name], tubular adenomas, repeat in 5 yrs    CORONARY ARTERY BYPASS GRAFT  08/30/2003   x3. LIMA-distal LAD, RIMA-circumflex, SVG-distal RCA   CYSTOSCOPY W/ STONE MANIPULATION     ESOPHAGOGASTRODUODENOSCOPY N/A 08/17/2013   Procedure: ESOPHAGOGASTRODUODENOSCOPY (EGD);  Surgeon: [name], MD;  Location: [facility] ENDOSCOPY;  Service: Endoscopy;  Laterality: N/A;   FEMORAL ARTERY STENT Left 05/23/2014   FEMORAL-FEMORAL BYPASS GRAFT  2003   FEMORAL-POPLITEAL BYPASS GRAFT Left 08/09/2016   Procedure: BYPASS GRAFT LEFT FEMORAL-POPLITEAL ARTERY USING GORE 6MM X 80CM PROPATEN GRAFT;  Surgeon: Serafina Mitchell, MD;  Location: [place];  Service: Vascular;  Laterality: Left;   LOWER EXTREMITY ANGIOGRAM N/A 03/17/2014   Procedure: LOWER EXTREMITY ANGIOGRAM;  Surgeon: [name], MD;  Location: [facility] CATH LAB;  Service: Cardiovascular;  Laterality: N/A;   PERIPHERAL VASCULAR CATHETERIZATION N/A 06/06/2016   Procedure: Lower Extremity Angiography;  Surgeon: [name], MD;  Location: [place] CV LAB;  Service: Cardiovascular;  Laterality: N/A;   PERIPHERAL VASCULAR CATHETERIZATION N/A 06/06/2016   Procedure: Abdominal Aortogram;  Surgeon: [name], MD;  Location: [place] CV LAB;  Service: Cardiovascular;  Laterality: N/A;   RENAL DUPLEX Bilateral 02/02/2013   Bilateral renal arteries demonstrate narrowingconsistent with a 60-99% diameter reduction.   TRANSTHORACIC ECHOCARDIOGRAM  08/02/2013   EF 55-60%, normal echocardiogram    MEDICATIONS: No current facility-administered medications for this encounter.    guaiFENesin (MUCINEX) 600 MG 12 hr tablet   acetaminophen (TYLENOL) 500 MG tablet   aspirin EC 81 MG tablet   clopidogrel (PLAVIX) 75 MG tablet   docusate sodium (COLACE) 100 MG capsule   ferrous gluconate (FERGON) 324 MG tablet   hydrochlorothiazide (HYDRODIURIL) 25 MG tablet   isosorbide mononitrate (IMDUR) 30 MG 24 hr tablet   metoprolol tartrate (LOPRESSOR) 50 MG tablet  NIFEdipine (ADALAT CC) 90 MG 24 hr tablet   simvastatin (ZOCOR) 20 MG tablet   vitamin B-12 (CYANOCOBALAMIN) 500 MCG tablet    Myra Gianotti, PA-C Surgical Short Stay/Anesthesiology [facility] Phone [phone] [facility] Phone [phone] 05/01/2021 4:01 PM

## 2021-05-01 NOTE — Anesthesia Preprocedure Evaluation (Addendum)
Anesthesia Evaluation    Reviewed: Allergy & Precautions, Patient's Chart, lab work & pertinent test results  Airway        Dental   Pulmonary Current Smoker,           Cardiovascular hypertension, Pt. on home beta blockers and Pt. on medications + Peripheral Vascular Disease       Neuro/Psych  Headaches, CVA    GI/Hepatic negative GI ROS, Neg liver ROS,   Endo/Other  negative endocrine ROS  Renal/GU negative Renal ROS     Musculoskeletal  (+) Arthritis ,   Abdominal   Peds  Hematology HLD   Anesthesia Other Findings Left Carotid Artery Lesion  Reproductive/Obstetrics                            Anesthesia Physical Anesthesia Plan  ASA:   Anesthesia Plan: General   Post-op Pain Management:    Induction:   PONV Risk Score and Plan:   Airway Management Planned:   Additional Equipment:   Intra-op Plan:   Post-operative Plan:   Informed Consent:   Plan Discussed with:   Anesthesia Plan Comments: (PAT note written 05/01/2021 by Myra Gianotti, PA-C. )       Anesthesia Quick Evaluation

## 2021-05-01 NOTE — Pre-Procedure Instructions (Addendum)
    Earl Castillo  05/01/2021    Your procedure is scheduled on Wednesday, June 29.  Report to [facility], Main Entrance or Entrance "A" at [facility] .                 Call this number if you have problems the morning of surgery: [phone]  This is the number for the Pre- Surgical Desk.  >>>>>Please send patient's Medication Record with medications administrated documentation. ( this information is required prior to OR. This includes medications that may have been on hold for surgery)<<<<<    Remember:  Do not eat or drink after midnight.    Take these medicines the morning of surgery with A SIP OF WATER : Aspirin, isosorbide mononitrate (IMDUR), NIFEdipine (ADALAT CC).  Prn: Tylenol Mucinex  Stop taking  Vitamins and Herbal Products (ie Fish Oil). Follow Dr. Stephens Shire instructions regarding Plavix  Patient should have a shower, with antibacteria soap, the morning of surgery . Dry off with a clean towel.  Patient should not have lotions, powders, colognes, deodorant, jewelry, or piercing's. Wear clean comfortable clothes.  Brush teeth. You may shave face.  Do not wear jewelry, Do not bring valuables to the hospital. [facility] is not responsible for any belongings or valuables.  Contacts, dentures or bridgework may not be worn into surgery.

## 2021-05-01 NOTE — Progress Notes (Signed)
Earl Castillo is at [facility] for rehab.  I spoke to Caryl Pina, patient 's nurse today. Caryl Pina reports that patient is doing well, Earl Castillo  is no longer on oxygen, his blood pressure today was 116/70 yesterday 124/72; the highest number she saw was 142/80. Patient is progressing in therapy, he is walking with a cane and stand by person to monitor. Caryl Pina states that Earl Castillo does not have signs and symptoms of Covid and has not had exposure to her knowledge. I faxed pre- procedure to [facility].

## 2021-05-02 ENCOUNTER — Other Ambulatory Visit: Payer: Self-pay

## 2021-05-02 ENCOUNTER — Encounter (HOSPITAL_COMMUNITY): Payer: Self-pay | Admitting: Surgery

## 2021-05-02 ENCOUNTER — Encounter (HOSPITAL_COMMUNITY)
Admission: RE | Disposition: A | Discharge status: Skilled Nursing Facility | Payer: Self-pay | Source: Home / Self Care | Attending: Surgery

## 2021-05-02 ENCOUNTER — Ambulatory Visit (HOSPITAL_COMMUNITY)
Admission: RE | Admit: 2021-05-02 | Discharge: 2021-05-02 | Disposition: A | Discharge status: Skilled Nursing Facility | Payer: Medicare Other | Attending: Surgery | Admitting: Surgery

## 2021-05-02 DIAGNOSIS — Z538 Procedure and treatment not carried out for other reasons: Secondary | ICD-10-CM | POA: Insufficient documentation

## 2021-05-02 DIAGNOSIS — I779 Disorder of arteries and arterioles, unspecified: Secondary | ICD-10-CM | POA: Diagnosis not present

## 2021-05-02 DIAGNOSIS — Z20822 Contact with and (suspected) exposure to covid-19: Secondary | ICD-10-CM | POA: Diagnosis not present

## 2021-05-02 LAB — COMPREHENSIVE METABOLIC PANEL
ALT: 15 U/L (ref 0–44)
AST: 21 U/L (ref 15–41)
Albumin: 3 g/dL — ABNORMAL LOW (ref 3.5–5.0)
Alkaline Phosphatase: 62 U/L (ref 38–126)
Anion gap: 8 (ref 5–15)
BUN: 22 mg/dL (ref 8–23)
CO2: 26 mmol/L (ref 22–32)
Calcium: 8.9 mg/dL (ref 8.9–10.3)
Chloride: 105 mmol/L (ref 98–111)
Creatinine, Ser: 1.08 mg/dL (ref 0.61–1.24)
GFR, Estimated: >60 mL/min (ref >60)
Glucose, Bld: 102 mg/dL — ABNORMAL HIGH (ref 70–99)
Potassium: 3.6 mmol/L (ref 3.5–5.1)
Sodium: 139 mmol/L (ref 135–145)
Total Bilirubin: 0.6 mg/dL (ref 0.3–1.2)
Total Protein: 6.6 g/dL (ref 6.5–8.1)

## 2021-05-02 LAB — PLATELET MAPPING ADP AND AA
AA Aggregation: 7.2 % — ABNORMAL LOW (ref 89–100)
AA Inhibition: 92.8 % — ABNORMAL HIGH (ref 0–11)
ADP Aggregation: 68.2 % — ABNORMAL LOW (ref 83–100)
ADP Inhibition: 31.8 % — ABNORMAL HIGH (ref 0–17)
Activator F: 16.6 mm (ref 2–19)
Adenosine 5 Diphosphate (MA): 49.9 mm (ref 45–69)
Arachidonic Acid (MA): 20.1 mm — ABNORMAL LOW (ref 51–71)
Kaolin with Heparinase: 65.4 mm (ref 53–68)

## 2021-05-02 LAB — CBC
HCT: 36.9 % — ABNORMAL LOW (ref 39.0–52.0)
Hemoglobin: 12.1 g/dL — ABNORMAL LOW (ref 13.0–17.0)
MCH: 33.1 pg (ref 26.0–34.0)
MCHC: 32.8 g/dL (ref 30.0–36.0)
MCV: 100.8 fL — ABNORMAL HIGH (ref 80.0–100.0)
Platelets: 171 10*3/uL (ref 150–400)
RBC: 3.66 MIL/uL — ABNORMAL LOW (ref 4.22–5.81)
RDW: 12.6 % (ref 11.5–15.5)
WBC: 9 10*3/uL (ref 4.0–10.5)
nRBC: 0 % (ref 0.0–0.2)

## 2021-05-02 LAB — PROTIME-INR
INR: 1.1 (ref 0.8–1.2)
Prothrombin Time: 14.5 seconds (ref 11.4–15.2)

## 2021-05-02 LAB — SURGICAL PCR SCREEN
MRSA, PCR: NEGATIVE
Staphylococcus aureus: POSITIVE — AB

## 2021-05-02 LAB — APTT: aPTT: 35 seconds (ref 24–36)

## 2021-05-02 LAB — TYPE AND SCREEN
ABO/RH(D): B POS
Antibody Screen: NEGATIVE

## 2021-05-02 LAB — SARS CORONAVIRUS 2 BY RT PCR (HOSPITAL ORDER, PERFORMED IN ~~LOC~~ HOSPITAL LAB): SARS Coronavirus 2: NEGATIVE

## 2021-05-02 SURGERY — TRANSCAROTID ARTERY REVASCULARIZATION (TCAR)
Anesthesia: General | Laterality: Left

## 2021-05-02 MED ORDER — CHLORHEXIDINE GLUCONATE 0.12 % MT SOLN
15.0000 mL | Freq: Once | OROMUCOSAL | Status: AC
Start: 2021-05-02 — End: 2021-05-02
  Administered 2021-05-02: 15 mL via OROMUCOSAL
  Filled 2021-05-02: qty 15

## 2021-05-02 MED ORDER — LACTATED RINGERS IV SOLN: INTRAVENOUS | Status: DC

## 2021-05-02 MED ORDER — METOPROLOL TARTRATE 50 MG PO TABS: 50.0000 mg | ORAL_TABLET | ORAL | Status: DC

## 2021-05-02 MED ORDER — ORAL CARE MOUTH RINSE: 15.0000 mL | Freq: Once | OROMUCOSAL | Status: AC

## 2021-05-02 MED ORDER — CHLORHEXIDINE GLUCONATE CLOTH 2 % EX PADS: 6.0000 | MEDICATED_PAD | Freq: Once | CUTANEOUS | Status: DC

## 2021-05-02 MED ORDER — ACETAMINOPHEN 500 MG PO TABS
1000.0000 mg | ORAL_TABLET | Freq: Once | ORAL | Status: AC
  Administered 2021-05-02: 1000 mg via ORAL
  Filled 2021-05-02: qty 2

## 2021-05-02 MED ORDER — FENTANYL CITRATE (PF) 250 MCG/5ML IJ SOLN
INTRAMUSCULAR | Status: AC
  Filled 2021-05-02: qty 5

## 2021-05-02 MED ORDER — METOPROLOL TARTRATE 50 MG PO TABS
ORAL_TABLET | ORAL | Status: AC
  Filled 2021-05-02: qty 1

## 2021-05-02 MED ORDER — SODIUM CHLORIDE 0.9 % IV SOLN: INTRAVENOUS | Status: DC

## 2021-05-02 MED ORDER — CEFAZOLIN SODIUM-DEXTROSE 2-4 GM/100ML-% IV SOLN
2.0000 g | INTRAVENOUS | Status: DC
  Filled 2021-05-02: qty 100

## 2021-05-02 NOTE — Progress Notes (Addendum)
Surgery cancelled. Patient aware. [facility] SNF called and notified. Pt provided snack tray.

## 2021-05-02 NOTE — Progress Notes (Signed)
Called and notified [facility] as well as pt's son, Lavoy Bernards of procedure cancellation. [place] to arrange for transport back to [facility].

## 2021-05-02 NOTE — Progress Notes (Signed)
PIV Removed. Dressing in place.

## 2021-05-02 NOTE — Progress Notes (Signed)
[facility] has arrived. Pt d/c'd to [facility].

## 2021-05-09 ENCOUNTER — Other Ambulatory Visit: Payer: Self-pay

## 2021-05-09 ENCOUNTER — Ambulatory Visit (INDEPENDENT_AMBULATORY_CARE_PROVIDER_SITE_OTHER): Payer: Medicare Other | Admitting: Medical

## 2021-05-09 ENCOUNTER — Encounter: Payer: Self-pay | Admitting: Medical

## 2021-05-09 VITALS — BP 132/58 | HR 54 | Ht 66.0 in | Wt 125.0 lb

## 2021-05-09 DIAGNOSIS — I6523 Occlusion and stenosis of bilateral carotid arteries: Secondary | ICD-10-CM

## 2021-05-09 DIAGNOSIS — I10 Essential (primary) hypertension: Secondary | ICD-10-CM | POA: Diagnosis not present

## 2021-05-09 DIAGNOSIS — E785 Hyperlipidemia, unspecified: Secondary | ICD-10-CM | POA: Diagnosis not present

## 2021-05-09 DIAGNOSIS — Z951 Presence of aortocoronary bypass graft: Secondary | ICD-10-CM | POA: Diagnosis not present

## 2021-05-09 DIAGNOSIS — Z720 Tobacco use: Secondary | ICD-10-CM | POA: Diagnosis not present

## 2021-05-09 DIAGNOSIS — I739 Peripheral vascular disease, unspecified: Secondary | ICD-10-CM

## 2021-05-09 DIAGNOSIS — I2510 Atherosclerotic heart disease of native coronary artery without angina pectoris: Secondary | ICD-10-CM | POA: Diagnosis not present

## 2021-05-09 NOTE — Patient Instructions (Signed)
Medication Instructions:  No Changes *If you need a refill on your cardiac medications before your next appointment, please call your pharmacy*   Lab Work: No Labs If you have labs (blood work) drawn today and your tests are completely normal, you will receive your results only by: [place] (if you have MyChart) OR A paper copy in the mail If you have any lab test that is abnormal or we need to change your treatment, we will call you to review the results.   Testing/Procedures: No Testing   Follow-Up: At [facility], you and your health needs are our priority.  As part of our continuing mission to provide you with exceptional heart care, we have created designated Provider Care Teams.  These Care Teams include your primary Cardiologist (physician) and Advanced Practice Providers (APPs -  Physician Assistants and Nurse Practitioners) who all work together to provide you with the care you need, when you need it.  We recommend signing up for the patient portal called "MyChart".  Sign up information is provided on this After Visit Summary.  MyChart is used to connect with patients for Virtual Visits (Telemedicine).  Patients are able to view lab/test results, encounter notes, upcoming appointments, etc.  Non-urgent messages can be sent to your provider as well.   To learn more about what you can do with MyChart, go to [email or web address].    Your next appointment:   6 month(s)  The format for your next appointment:   In Person  Provider:   You may see Quay Burow, MD or one of the following Advanced Practice Providers on your designated Care Team:   Roby Lofts , PA-C Coletta Memos, FNP

## 2021-05-09 NOTE — Progress Notes (Signed)
Cardiology Office Note   Date:  05/09/2021   ID:  Earl Castillo, DOB 1941/10/01, MRN [id number]  PCP:  [name], MD  Cardiologist:  Quay Burow, MD EP: None  Chief Complaint  Patient presents with   Hospitalization Follow-up    CHF, CAD      History of Present Illness: Earl Castillo is a 80 y.o. male with a PMH of CAD s/p CABG 2004, PAD s/p femoral-femoral bypass graft in 2000, SFA stenting in 2015, and Fem-pop in 2017, carotid artery disease s/p right CEA in 2006 with plans for left CEA/TCAR in the coming weeks, HTN, HLD, and tobacco abuse who presents for hospital follow-up.  He was last evaluated by cardiology at an outpatient visit with Dr. Gwenlyn Castillo 12/2020, at which time he was doing okay from a cardiac standpoint without anginal or claudication complaints. He was referred back to vascular surgery for further evaluation of his carotid artery disease at that time.   He was recently admitted to [facility] from 04/09/21-04/16/21 after presenting with multiple falls at home and acute diastolic CHF. He was diuresed with IV lasix with improvement in hypoxic respiratory failure. Echocardiogram that admission showed EF 65-70%, no RWMA, normal LV diastolic CHF, and mild aortic valve sclerosis without stenosis. His last ischemic evaluation was a NST in 2013 which was without ischemia; low risk study. She underwent a recent carotid angiography which showed high grade left common internal carotid artery stenosis recommended for left CEA and left-sided TCAR to be arranged by vascular surgery in the coming weeks. His surgery was scheduled for 05/02/21 though cancelled on the day of the procedure. Patient reports Dr. Trula Castillo fell ill and the procedure was cancelled for that reason. Does not appear to be rescheduled at this time.    Patient presents today with his brother for post-hospital follow-up. He returned to [facility] following his hospital stay and has been doing fairly well.  He denies any chest pain, SOB, DOE, dizziness, syncope, palpitations, orthopnea, PND, or LE edema since discharge. He describes occasional orthostatic symptoms. Unrelated he has had a fall and a couple near falls due to unsteadiness when he doesn't use his walker. Prior to his hospitalization he was an every day smoker. We discussed benefits from abstaining from tobacco abuse upon return home.       Past Medical History:  Diagnosis Date   Anemia    Angiodysplasia of intestine - small bowel  09/21/2013   Blood transfusion abn reaction or complication, no procedure mishap    "HgB dropped real low"   Cancer ([place])    Skin cancers   Carotid artery disease ([place])    status post carotid endarterectomy by Dr. Lucky Castillo   CHF (congestive heart failure) ([place]) 04/09/2021   new diagnosis   Chicken pox    Colon polyps 08/2012   Hyperplastic and adenomatous sessile polyps   Coronary artery disease    CABG'04, Low risk Myoview 2013   GERD (gastroesophageal reflux disease)    Hemorrhoid    History of blood transfusion 08/2013   "HgB dropped real low; related to bleeding from my rectum"   History of kidney stones    1 removed surgical, passed 2   Hyperlipidemia    Hypertension    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 08/17/2013   Lower GI bleeding 08/2013   Osteoarthritis    "hips" (05/23/2014)   Peripheral arterial disease ([place])  status post left to right fem-fem crossover graft in by [name]   Pneumonia    - "years ago"   Sinus headache    "alot" (05/23/2014)   Skin exam, screening for cancer    sees [name]    Stroke [facility])    Vitamin B12 deficiency 11/23/2013    Past Surgical History:  Procedure Laterality Date   BAND HEMORRHOIDECTOMY     BASAL CELL CARCINOMA EXCISION  May 2009   per [name] , left nose    basket extraction of ureteral stone  1970's   Bypass rt leg Right 2000   capsule endoscopy  10-05-13    per [name], 4 AVM's   CARDIAC CATHETERIZATION  08/18/2003   Recommend CABG   CARDIOVASCULAR STRESS TEST - LEXISCAN  08/25/2012   No significant wall motion abnormalities   CAROTID ANGIOGRAM N/A 03/17/2014   Procedure: CAROTID Cyril Loosen;  Surgeon: [name], MD;  Location: [facility] CATH LAB;  Service: Cardiovascular;  Laterality: N/A;  right carotid   CAROTID ANGIOGRAPHY N/A 04/06/2021   Procedure: CAROTID ANGIOGRAPHY;  Surgeon: Elam Dutch, MD;  Location: [place] CV LAB;  Service: Cardiovascular;  Laterality: N/A;   CAROTID ENDARTERECTOMY Right    CAROTID ENDARTERECTOMY Right 03/26/2005   Dr Earl Castillo   CATARACT EXTRACTION W/ INTRAOCULAR LENS  IMPLANT, BILATERAL Bilateral 2014   per [name]   CERVICAL DISC SURGERY  1978   "bulging disc"   COLONOSCOPY  08-12-12   per [name], tubular adenomas, repeat in 5 yrs    CORONARY ARTERY BYPASS GRAFT  08/30/2003   x3. LIMA-distal LAD, RIMA-circumflex, SVG-distal RCA   CYSTOSCOPY W/ STONE MANIPULATION     ESOPHAGOGASTRODUODENOSCOPY N/A 08/17/2013   Procedure: ESOPHAGOGASTRODUODENOSCOPY (EGD);  Surgeon: [name], MD;  Location: [facility] ENDOSCOPY;  Service: Endoscopy;  Laterality: N/A;   FEMORAL ARTERY STENT Left 05/23/2014   FEMORAL-FEMORAL BYPASS GRAFT  2003   FEMORAL-POPLITEAL BYPASS GRAFT Left 08/09/2016   Procedure: BYPASS GRAFT LEFT FEMORAL-POPLITEAL ARTERY USING GORE 6MM X 80CM PROPATEN GRAFT;  Surgeon: Serafina Mitchell, MD;  Location: [place];  Service: Vascular;  Laterality: Left;   LOWER EXTREMITY ANGIOGRAM N/A 03/17/2014   Procedure: LOWER EXTREMITY ANGIOGRAM;  Surgeon: [name], MD;  Location: [facility] CATH LAB;  Service: Cardiovascular;  Laterality: N/A;   PERIPHERAL VASCULAR CATHETERIZATION N/A 06/06/2016   Procedure: Lower Extremity Angiography;  Surgeon: [name], MD;  Location: [place] CV LAB;  Service: Cardiovascular;  Laterality: N/A;   PERIPHERAL VASCULAR CATHETERIZATION N/A 06/06/2016   Procedure:  Abdominal Aortogram;  Surgeon: [name], MD;  Location: [place] CV LAB;  Service: Cardiovascular;  Laterality: N/A;   RENAL DUPLEX Bilateral 02/02/2013   Bilateral renal arteries demonstrate narrowingconsistent with a 60-99% diameter reduction.   TRANSTHORACIC ECHOCARDIOGRAM  08/02/2013   EF 55-60%, normal echocardiogram     Current Outpatient Medications  Medication Sig Dispense Refill   acetaminophen (TYLENOL) 500 MG tablet Take 1,000 mg by mouth daily as needed for moderate pain (for leg pain).     aspirin EC 81 MG tablet Take 81 mg by mouth every evening.      clopidogrel (PLAVIX) 75 MG tablet TAKE 1 TABLET BY MOUTH EVERY DAY 90 tablet 3   docusate sodium (COLACE) 100 MG capsule Take 100 mg by mouth daily as needed (for constipation).      ferrous gluconate (FERGON) 324 MG tablet TAKE 1 TABLET BY MOUTH EVERY DAY WITH  BREAKFAST. 90 tablet 0   guaiFENesin (MUCINEX) 600 MG 12 hr tablet Take by mouth 2 (two) times daily as needed.     hydrochlorothiazide (HYDRODIURIL) 25 MG tablet Take 25 mg by mouth daily.     isosorbide mononitrate (IMDUR) 30 MG 24 hr tablet TAKE 1 TABLET (30 MG TOTAL) BY MOUTH DAILY. NEED OV. 90 tablet 3   metoprolol tartrate (LOPRESSOR) 50 MG tablet TAKE 1 TABLET BY MOUTH EVERY DAY 90 tablet 1   NIFEdipine (ADALAT CC) 90 MG 24 hr tablet TAKE 1 TABLET BY MOUTH EVERY DAY 90 tablet 3   simvastatin (ZOCOR) 20 MG tablet TAKE 1 TABLET (20 MG TOTAL) BY MOUTH DAILY AT 6 PM. NEED OV. 90 tablet 1   vitamin B-12 (CYANOCOBALAMIN) 500 MCG tablet Take 500 mcg by mouth in the morning.     No current facility-administered medications for this visit.    Allergies:   Patient has no known allergies.    Social History:  The patient  reports that he has been smoking cigarettes. He has a 14.50 pack-year smoking history. He has never used smokeless tobacco. He reports that he does not drink alcohol and does not use drugs.   Family History:  The patient's family history includes  Coronary artery disease in his brother, brother, and sister; Heart disease in his brother, father, sister, and sister.    ROS:  Please see the history of present illness.   Otherwise, review of systems are positive for none.   All other systems are reviewed and negative.    PHYSICAL EXAM: VS:  BP (!) 132/58   Pulse (!) 54   Ht 5\' 6"  (1.676 m)   Wt 125 lb (56.7 kg)   SpO2 96%   BMI 20.18 kg/m  , BMI Body mass index is 20.18 kg/m. GEN: Well nourished, well developed, in no acute distress HEENT: sclera anicteric Neck: no JVD, carotid bruits, or masses Cardiac: RRR; no murmurs, rubs, or gallops, no edema  Respiratory:  mild expiratory wheeze on exam, otherwise CTAB GI: soft, nontender, nondistended, + BS MS: no deformity or atrophy Skin: warm and dry, no rash Neuro:  Strength and sensation are intact Psych: euthymic mood, full affect   EKG:  EKG is not ordered today.   Recent Labs: 04/13/2021: B Natriuretic Peptide 32.5 04/14/2021: Magnesium 2.6 05/02/2021: ALT 15; BUN 22; Creatinine, Ser 1.08; Hemoglobin 12.1; Platelets 171; Potassium 3.6; Sodium 139    Lipid Panel    Component Value Date/Time   CHOL 120 01/01/2021 0846   TRIG 78 01/01/2021 0846   HDL 55 01/01/2021 0846   CHOLHDL 2.2 01/01/2021 0846   CHOLHDL 2 12/23/2018 1124   VLDL 13.6 12/23/2018 1124   LDLCALC 49 01/01/2021 0846      Wt Readings from Last 3 Encounters:  05/09/21 125 lb (56.7 kg)  05/02/21 120 lb (54.4 kg)  04/16/21 112 lb 3.4 oz (50.9 kg)      Other studies Reviewed: Additional studies/ records that were reviewed today include:   Echocardiogram 04/10/21: 1. Left ventricular ejection fraction, by estimation, is 65 to 70%. The  left ventricle has normal function. The left ventricle has no regional  wall motion abnormalities. Left ventricular diastolic parameters were  normal.   2. Right ventricular systolic function is normal. The right ventricular  size is normal. There is normal pulmonary  artery systolic pressure.   3. The mitral valve is normal in structure. No evidence of mitral valve  regurgitation. No evidence of mitral  stenosis.   4. The aortic valve is normal in structure. Aortic valve regurgitation is  not visualized. Mild aortic valve sclerosis is present, with no evidence  of aortic valve stenosis.   5. The inferior vena cava is normal in size with greater than 50%  respiratory variability, suggesting right atrial pressure of 3 mmHg.   Carotid angiography 04/06/2021: Operative findings:  1.  Severe calcific greater than 90% stenosis left internal carotid artery   2.  Type I aortic arch   3.  50% ostial stenosis left common carotid artery  ASSESSMENT AND PLAN:  1. CAD s/p CABG in 2004: no anginal complaints but quite limited in mobility.  - Continue aspirin and plavix - Continue statin - Continue Bblocker and imdur  2. Carotid artery stenosis s/p right CEA in 2006: recently Castillo to have high grade left ICA stenosis with plans for upcoming left CEA/TCAR with vascular surgery in the coming weeks. Encouraged patient to follow-up with vascular surgery for a reschedule date - Continue aspirin and plavix - Continue statin  3. Recent hospitalization for acute diastolic CHF: patient admitted 04/2021 - BNP elevated to 622 and improved to 32 with IV lasix. Discharged home on HCTZ. Echo that admission showed EF 65-70% with normal LV diastolic function.   4. PAD s/p fem-fem bypass in 2000, SFA stenting in 2015, and fem-pop in 2017: no claudication complaints - Continue aspirin and plavix - Continue statin  5. HTN: BP 132/58 today - Continue HCTZ, metoprolol, imdur, and nifedipine  6. HLD: LDL 49 12/2020 - Continue simvastatin  7. Occular stroke: occurred after the first of the year 2022 prompting above carotid artery work-up. - Continue aspirin, plavix, and statin  8. Tobacco abuse: was an every day smoker prior to his recent hospitalization though has not smoked  at his current facility - Continue to encourage   Current medicines are reviewed at length with the patient today.  The patient does not have concerns regarding medicines.  The following changes have been made:  As above  Labs/ tests ordered today include:  No orders of the defined types were placed in this encounter.    Disposition:   FU with Dr. Gwenlyn Castillo or myself in 6 months  Signed, Abigail Butts, PA-C  05/09/2021 2:55 PM

## 2021-05-10 ENCOUNTER — Non-Acute Institutional Stay: Payer: Medicare Other | Admitting: Nurse Practitioner

## 2021-05-10 DIAGNOSIS — Z515 Encounter for palliative care: Secondary | ICD-10-CM

## 2021-05-10 DIAGNOSIS — R531 Weakness: Secondary | ICD-10-CM | POA: Diagnosis not present

## 2021-05-10 DIAGNOSIS — I509 Heart failure, unspecified: Secondary | ICD-10-CM | POA: Diagnosis not present

## 2021-05-10 NOTE — Progress Notes (Signed)
[facility] Pall[facility] Consult Note Telephone: [phone]  Fax: [phone]    Date of encounter: 05/10/21 PATIENT NAME: Earl Castillo Earl Castillo [id number]   [phone] (home)  DOB: 02/17/1941 MRN: [id number]  PRIMARY CARE PROVIDER:    [name], MD,  [place] [address] [phone]  REFERRING PROVIDER:   [name], MD [address] [place],  [place] [address] [phone]  RESPONSIBLE PARTY:    Contact Information     Name Relation Home Work Earl Castillo Son [phone]  [phone]   Earl Castillo   [phone]   Earl Castillo Son   [phone]   Earl Castillo Niece   [phone]     I met face to face with patient in facility. His son Earl Castillo present during visit.  Palliative Care was asked to follow this patient by consultation request of  Earl Morale, MD to address advance care planning and complex medical decision making. This is the initial visit.                                  ASSESSMENT AND PLAN / RECOMMENDATIONS:   Advance Care Planning/Goals of Care: Goals include to maximize quality of life and symptom management.  Our advance care planning conversation included a discussion about:    The value and importance of advance care planning  Experiences with loved ones who have been seriously ill or have died  Exploration of personal, cultural or spiritual beliefs that might influence medical decisions  Exploration of goals of care in the event of a sudden injury or illness  CODE STATUS: Full code Goal of care: Patient's goal of care is function. Patient verbalized desire to get stronger with therapy and return to his home. Directives: Patient's son verbalized that family would want patient to be resuscitated in the event of cardiac or respiratory arrest. Son Earl Castillo does not want discussion about advance care planning held in the presence  of patient saying he and his brother want to have the opportunity to discuss it with patient first. Validation provided. It was explained to patient that code status will be regularly reviewed to ensure that it reflects patient wishes. Patient and family open to further discussion on code status and possible changes in the future.  I spent 17 minutes providing this consultation. More than 50% of the time in this consultation was spent in counseling and care coordination. ------------------------------------------------------------------------------------------  Symptom Management/Plan: Generalized weakness: continue physical therapy. Patient's goal is to return home after rehab stay. Encourage consistent use of assistive device during ambulation. Maintain safety. Congestive heart failure: no evidence of acute exacerbation. Continue current plan of care. CAD: patient verbalized desire for aggressive treatment for his cardiac condition, plan in place for hybrid carotid revascularization. Continue to follow up with cardiology and vascular surgery as planned. Provided general support and encouragement. Questions and concerns were addressed. Patient and his family was encouraged to call with questions and/or concerns. My business card was provided.   Follow up Palliative Care Visit: Palliative care will continue to follow for complex medical decision making, advance care planning, and clarification of goals. Return about 6-8 weeks or prn.  PPS: 50%  HOSPICE ELIGIBILITY/DIAGNOSIS: TBD  Chief Complaint: generalized weakness  History obtained from review of Epic EMR, discussion with facility staff and interview with Earl Castillo and his son Earl Castillo.  HISTORY OF PRESENT ILLNESS:  Earl Castillo is a 80 y.o. year old male with multiple medical problems including CAD s/p CABG and recent large cerebral angiogram on 04/06/21 with Dr. [name] via left common femoral puncture, CHF (EF 60-70% 04/10/2021), hypertension,  hyperlipidemia,GERD, PAD s/p left right femoral-femoral bypass graft in 2000 and recent left retinal artery occlusion in February 2022, recurrent falls.  Patient with report of generalized weakness in the context of deconditioning related to recent hospitalization from 04/09/2021 to 04/16/2021 for HF exacerbation. Patient was discharged to SNF for rehabilitation and nursing services. Generalized weakness associated with frequent falls. Staff report related non compliance with use of walker for ambulation.  CAD is ongoing, patient is pending carotid Enterectomy surgery, he denied chest pain today. He is awake and alert at the time of visit. He requires moderate assist with his ADLs. He does not appear acutely ill, voiced no concerns. Nursing report compliance with medication regimen. No report of fever or chills.   I reviewed available labs, medications, imaging, studies and related documents from the EMR.  Records reviewed and summarized above.   ROS EYES: denies acute vision changes ENMT: denies dysphagia Cardiovascular: denies chest pain, denies DOE Pulmonary: denies cough, denies increased SOB Abdomen: endorses fair appetite, denies constipation, endorses continence of bowel GU: denies dysuria, endorses continence of urine MSK: endorsed weakness, no falls reported Skin: denies rashes or wounds Neurological: denies uncontrolled pain, denies insomnia Psych: Endorses positive mood Heme/lymph/immuno: denies bruises, abnormal bleeding  Physical Exam: Current and past weights: 121lbs up from 119lbs three months ago. BMI 19.64kg/m2 General: frail appearing, cooperative, lying in bed in NAD EYES: anicteric sclera, no discharge  ENMT: intact hearing, oral mucous membranes moist CV: S1S2 normal, no LE edema Pulmonary: LCTA, no increased work of breathing, no cough, room air Abdomen: soft and non tender, no ascites GU: deferred MSK: mild sarcopenia, moves all extremities, ambulatory Skin: warm and  dry, no rashes or wounds on visible skin Neuro: no generalized weakness,  mild cognitive impairment Psych: non-anxious affect, A and O x 3 Hem/lymph/immuno: no widespread bruising  CURRENT PROBLEM LIST:  Patient Active Problem List   Diagnosis Date Noted   Protein-calorie malnutrition, severe 04/11/2021   Acute CHF (congestive heart failure) ([place]) 04/09/2021   Acute respiratory failure with hypoxia ([place]) 04/09/2021   NAION (non-arteritic anterior ischemic optic neuropathy), left 12/28/2020   Choroidal nevus, right 11/30/2020   PAD (peripheral artery disease) ([place]) 08/09/2016   GI bleed 06/22/2014   Current smoker 05/24/2014   Claudication ([place]) 05/23/2014   Vitamin B12 deficiency 11/23/2013   Angiodysplasia of intestine - small bowel  09/21/2013   PVD- Lt SFA PTA/stent 05/23/14 09/13/2013   Carotid artery disease ([place]) 09/13/2013   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 08/17/2013   Personal history of colonic polyps-adenoma 08/17/2012   NEPHROLITHIASIS, HX OF 09/10/2010   URTICARIA 08/22/2010   Coronary atherosclerosis 01/13/2009   Osteoarthritis 01/13/2009   CHICKENPOX, HX OF 01/13/2008   Hyperlipidemia 07/29/2007   Essential hypertension 07/29/2007   PAST MEDICAL HISTORY:  Active Ambulatory Problems    Diagnosis Date Noted   Hyperlipidemia 07/29/2007   Essential hypertension 07/29/2007   Coronary atherosclerosis 01/13/2009   URTICARIA 08/22/2010   Osteoarthritis 01/13/2009   NEPHROLITHIASIS, HX OF 09/10/2010   CHICKENPOX, HX OF 01/13/2008   Personal history of colonic polyps-adenoma 08/17/2012   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 08/17/2013   PVD- Lt SFA PTA/stent 05/23/14  09/13/2013   Carotid artery disease ([place]) 09/13/2013   Angiodysplasia of intestine - small bowel  09/21/2013   Vitamin B12 deficiency 11/23/2013   Claudication ([place]) 05/23/2014   Current smoker 05/24/2014    GI bleed 06/22/2014   PAD (peripheral artery disease) ([place]) 08/09/2016   Choroidal nevus, right 11/30/2020   NAION (non-arteritic anterior ischemic optic neuropathy), left 12/28/2020   Acute CHF (congestive heart failure) ([place]) 04/09/2021   Acute respiratory failure with hypoxia ([place]) 04/09/2021   Protein-calorie malnutrition, severe 04/11/2021   Resolved Ambulatory Problems    Diagnosis Date Noted   CERUMEN IMPACTION 04/25/2008   Acute sinusitis, unspecified 01/13/2008   Acute bronchitis 01/13/2009   NEPHROLITHIASIS 09/10/2010   Fatigue 08/16/2013   Acute blood loss anemia 08/16/2013   Past Medical History:  Diagnosis Date   Anemia    Blood transfusion abn reaction or complication, no procedure mishap    Cancer ([place])    CHF (congestive heart failure) ([place]) 04/09/2021   Chicken pox    Colon polyps 08/2012   Coronary artery disease    GERD (gastroesophageal reflux disease)    Hemorrhoid    History of blood transfusion 08/2013   History of kidney stones    Hypertension    Lower GI bleeding 08/2013   Pneumonia    Sinus headache    Skin exam, screening for cancer    [facility]    SOCIAL HX:  Social History   Tobacco Use   Smoking status: Every Day    Packs/day: 0.25    Years: 58.00    Pack years: 14.50    Types: Cigarettes   Smokeless tobacco: Never   Tobacco comments:    3-4 cigarettes/day  Substance Use Topics   Alcohol use: No    Alcohol/week: 0.0 standard drinks   FAMILY HX:  Family History  Problem Relation Age of Onset   Heart disease Father    Heart disease Sister    Coronary artery disease Brother        CABG x 6   Heart disease Brother    Coronary artery disease Brother        first MI 45, died at 33 during cardiac surgery   Coronary artery disease Sister        CABG x 5, RAS   Heart disease Sister    Colon cancer Neg Hx      ALLERGIES: No Known Allergies   PERTINENT MEDICATIONS:  Outpatient Encounter Medications as of 05/10/2021   Medication Sig   acetaminophen (TYLENOL) 500 MG tablet Take 1,000 mg by mouth daily as needed for moderate pain (for leg pain).   aspirin EC 81 MG tablet Take 81 mg by mouth every evening.    clopidogrel (PLAVIX) 75 MG tablet TAKE 1 TABLET BY MOUTH EVERY DAY   docusate sodium (COLACE) 100 MG capsule Take 100 mg by mouth daily as needed (for constipation).    ferrous gluconate (FERGON) 324 MG tablet TAKE 1 TABLET BY MOUTH EVERY DAY WITH BREAKFAST.   guaiFENesin (MUCINEX) 600 MG 12 hr tablet Take by mouth 2 (two) times daily as needed.   hydrochlorothiazide (HYDRODIURIL) 25 MG tablet Take 25 mg by mouth daily.   isosorbide mononitrate (IMDUR) 30 MG 24 hr tablet TAKE 1 TABLET (30 MG TOTAL) BY MOUTH DAILY. NEED OV.   metoprolol tartrate (LOPRESSOR) 50 MG tablet TAKE 1 TABLET BY MOUTH EVERY DAY   NIFEdipine (ADALAT CC) 90 MG 24 hr tablet TAKE 1 TABLET BY MOUTH EVERY DAY  simvastatin (ZOCOR) 20 MG tablet TAKE 1 TABLET (20 MG TOTAL) BY MOUTH DAILY AT 6 PM. NEED OV.   vitamin B-12 (CYANOCOBALAMIN) 500 MCG tablet Take 500 mcg by mouth in the morning.   No facility-administered encounter medications on file as of 05/10/2021.   Thank you for the opportunity to participate in the care of Mr. Zurn.  The palliative care team will continue to follow. Please call our office at [phone] if we can be of additional assistance.   Jari Favre, DNP, AGPCNP-BC  COVID-19 PATIENT SCREENING TOOL Asked and negative response unless otherwise noted:   Have you had symptoms of covid, tested positive or been in contact with someone with symptoms/positive test in the past 5-10 days?

## 2021-05-14 ENCOUNTER — Other Ambulatory Visit: Payer: Self-pay

## 2021-05-17 DIAGNOSIS — I739 Peripheral vascular disease, unspecified: Secondary | ICD-10-CM | POA: Diagnosis not present

## 2021-05-17 DIAGNOSIS — E785 Hyperlipidemia, unspecified: Secondary | ICD-10-CM | POA: Diagnosis not present

## 2021-05-17 DIAGNOSIS — R5381 Other malaise: Secondary | ICD-10-CM | POA: Diagnosis not present

## 2021-05-17 DIAGNOSIS — I2510 Atherosclerotic heart disease of native coronary artery without angina pectoris: Secondary | ICD-10-CM | POA: Diagnosis not present

## 2021-05-17 DIAGNOSIS — I509 Heart failure, unspecified: Secondary | ICD-10-CM | POA: Diagnosis not present

## 2021-05-17 DIAGNOSIS — J9601 Acute respiratory failure with hypoxia: Secondary | ICD-10-CM | POA: Diagnosis not present

## 2021-05-17 DIAGNOSIS — I10 Essential (primary) hypertension: Secondary | ICD-10-CM | POA: Diagnosis not present

## 2021-05-21 DIAGNOSIS — D508 Other iron deficiency anemias: Secondary | ICD-10-CM | POA: Diagnosis not present

## 2021-05-21 DIAGNOSIS — E785 Hyperlipidemia, unspecified: Secondary | ICD-10-CM | POA: Diagnosis not present

## 2021-05-21 DIAGNOSIS — I739 Peripheral vascular disease, unspecified: Secondary | ICD-10-CM | POA: Diagnosis not present

## 2021-05-21 DIAGNOSIS — I2510 Atherosclerotic heart disease of native coronary artery without angina pectoris: Secondary | ICD-10-CM | POA: Diagnosis not present

## 2021-05-21 DIAGNOSIS — R0989 Other specified symptoms and signs involving the circulatory and respiratory systems: Secondary | ICD-10-CM | POA: Diagnosis not present

## 2021-05-21 DIAGNOSIS — I509 Heart failure, unspecified: Secondary | ICD-10-CM | POA: Diagnosis not present

## 2021-05-21 DIAGNOSIS — N179 Acute kidney failure, unspecified: Secondary | ICD-10-CM | POA: Diagnosis not present

## 2021-05-21 DIAGNOSIS — E538 Deficiency of other specified B group vitamins: Secondary | ICD-10-CM | POA: Diagnosis not present

## 2021-05-21 DIAGNOSIS — R5381 Other malaise: Secondary | ICD-10-CM | POA: Diagnosis not present

## 2021-05-21 DIAGNOSIS — I10 Essential (primary) hypertension: Secondary | ICD-10-CM | POA: Diagnosis not present

## 2021-05-27 DIAGNOSIS — Z7982 Long term (current) use of aspirin: Secondary | ICD-10-CM | POA: Diagnosis not present

## 2021-05-27 DIAGNOSIS — Z9181 History of falling: Secondary | ICD-10-CM | POA: Diagnosis not present

## 2021-05-27 DIAGNOSIS — H349 Unspecified retinal vascular occlusion: Secondary | ICD-10-CM | POA: Diagnosis not present

## 2021-05-27 DIAGNOSIS — E538 Deficiency of other specified B group vitamins: Secondary | ICD-10-CM | POA: Diagnosis not present

## 2021-05-27 DIAGNOSIS — Z720 Tobacco use: Secondary | ICD-10-CM | POA: Diagnosis not present

## 2021-05-27 DIAGNOSIS — I2510 Atherosclerotic heart disease of native coronary artery without angina pectoris: Secondary | ICD-10-CM | POA: Diagnosis not present

## 2021-05-27 DIAGNOSIS — D509 Iron deficiency anemia, unspecified: Secondary | ICD-10-CM | POA: Diagnosis not present

## 2021-05-27 DIAGNOSIS — M1990 Unspecified osteoarthritis, unspecified site: Secondary | ICD-10-CM | POA: Diagnosis not present

## 2021-05-27 DIAGNOSIS — I739 Peripheral vascular disease, unspecified: Secondary | ICD-10-CM | POA: Diagnosis not present

## 2021-05-27 DIAGNOSIS — K219 Gastro-esophageal reflux disease without esophagitis: Secondary | ICD-10-CM | POA: Diagnosis not present

## 2021-05-27 DIAGNOSIS — I10 Essential (primary) hypertension: Secondary | ICD-10-CM | POA: Diagnosis not present

## 2021-05-27 DIAGNOSIS — E785 Hyperlipidemia, unspecified: Secondary | ICD-10-CM | POA: Diagnosis not present

## 2021-05-27 DIAGNOSIS — R296 Repeated falls: Secondary | ICD-10-CM | POA: Diagnosis not present

## 2021-05-27 DIAGNOSIS — I5030 Unspecified diastolic (congestive) heart failure: Secondary | ICD-10-CM | POA: Diagnosis not present

## 2021-05-27 DIAGNOSIS — Z7902 Long term (current) use of antithrombotics/antiplatelets: Secondary | ICD-10-CM | POA: Diagnosis not present

## 2021-05-28 ENCOUNTER — Telehealth: Payer: Self-pay | Admitting: Family Medicine

## 2021-05-28 DIAGNOSIS — I10 Essential (primary) hypertension: Secondary | ICD-10-CM | POA: Diagnosis not present

## 2021-05-28 DIAGNOSIS — M1990 Unspecified osteoarthritis, unspecified site: Secondary | ICD-10-CM | POA: Diagnosis not present

## 2021-05-28 DIAGNOSIS — D509 Iron deficiency anemia, unspecified: Secondary | ICD-10-CM | POA: Diagnosis not present

## 2021-05-28 DIAGNOSIS — I739 Peripheral vascular disease, unspecified: Secondary | ICD-10-CM | POA: Diagnosis not present

## 2021-05-28 DIAGNOSIS — I5030 Unspecified diastolic (congestive) heart failure: Secondary | ICD-10-CM | POA: Diagnosis not present

## 2021-05-28 DIAGNOSIS — I2510 Atherosclerotic heart disease of native coronary artery without angina pectoris: Secondary | ICD-10-CM | POA: Diagnosis not present

## 2021-05-28 NOTE — Telephone Encounter (Signed)
Okay for verbal orders? Please advise 

## 2021-05-28 NOTE — Telephone Encounter (Signed)
Physical Therapy-- 2 times a week for 3 weeks and then once a week for 6 weeks.  Foley from [facility] is asking for verbal orders so she is requesting a call back.

## 2021-05-29 ENCOUNTER — Other Ambulatory Visit (HOSPITAL_COMMUNITY)
Admission: RE | Admit: 2021-05-29 | Discharge: 2021-05-29 | Disposition: A | Discharge status: Home / Self Care | Payer: Medicare Other | Source: Ambulatory Visit | Attending: Surgery | Admitting: Surgery

## 2021-05-29 DIAGNOSIS — Z01812 Encounter for preprocedural laboratory examination: Secondary | ICD-10-CM | POA: Diagnosis not present

## 2021-05-29 DIAGNOSIS — Z20822 Contact with and (suspected) exposure to covid-19: Secondary | ICD-10-CM | POA: Diagnosis not present

## 2021-05-29 LAB — SARS CORONAVIRUS 2 (TAT 6-24 HRS): SARS Coronavirus 2: NEGATIVE

## 2021-05-29 NOTE — Telephone Encounter (Signed)
Verbal orders given to [place]

## 2021-05-29 NOTE — Telephone Encounter (Signed)
Please okay the orders  

## 2021-05-31 ENCOUNTER — Encounter (HOSPITAL_COMMUNITY): Payer: Self-pay | Admitting: Certified Registered Nurse Anesthetist

## 2021-05-31 ENCOUNTER — Telehealth: Payer: Self-pay | Admitting: Family Medicine

## 2021-05-31 ENCOUNTER — Telehealth: Payer: Self-pay

## 2021-05-31 DIAGNOSIS — D509 Iron deficiency anemia, unspecified: Secondary | ICD-10-CM | POA: Diagnosis not present

## 2021-05-31 DIAGNOSIS — I739 Peripheral vascular disease, unspecified: Secondary | ICD-10-CM | POA: Diagnosis not present

## 2021-05-31 DIAGNOSIS — I2510 Atherosclerotic heart disease of native coronary artery without angina pectoris: Secondary | ICD-10-CM | POA: Diagnosis not present

## 2021-05-31 DIAGNOSIS — M1990 Unspecified osteoarthritis, unspecified site: Secondary | ICD-10-CM | POA: Diagnosis not present

## 2021-05-31 DIAGNOSIS — I5030 Unspecified diastolic (congestive) heart failure: Secondary | ICD-10-CM | POA: Diagnosis not present

## 2021-05-31 DIAGNOSIS — I10 Essential (primary) hypertension: Secondary | ICD-10-CM | POA: Diagnosis not present

## 2021-05-31 NOTE — Telephone Encounter (Signed)
Patient's son calls today to report that patient was released from the [facility] on Saturday. Since then, he has fallen at least 4 times. He was very confused yesterday and had decreased alertness. This has improved slightly this morning. He is having difficulty urinating and increased frequency. They are unsure if he is running a fever. Discussed with Dr. [name], unfortunately tomorrow's surgery needs to be postponed until patient is cleared by PCP. I have called Dr. Sarajane Jews and informed them I would be sending surgical clearance and appt was needed ASAP. They will call to set up appt. Patient's son is aware.

## 2021-05-31 NOTE — Telephone Encounter (Signed)
Earl Castillo - PT Assistant from [place] called to let Dr. Sarajane Jews know that the patient has had 3 falls in the past week and has bruising and abrasions on the right side of his back.  [place] [phone]

## 2021-05-31 NOTE — Progress Notes (Addendum)
I called [facility] and was informed that patient was discharged home on 05/26/21. I called the home number today, this is Todd's cell phone.  [name] reports that Mr. Toupin has not been doing well since he was discharged from [facility], He has been falling , started on Sunday, 05/27/10.  [name] reports that he is leaning back from his  walker and not into the walker, "very unstable and has had some confusion."  [name] reports that Mr Seefried fell using walker again this am. [name] reports that patient has not hit his head, he scrapped an elbow with one of the falls. [name] reports that Mr. Stawski seemed a little clearer this am. I called the RN line at  Dr. Stephens Shire office and left a message with information [name] gave me.  I called Todd back and  asked if I could call Mr. Haberle's niece who was staying with patient this morning, [name] said that he things that she is no longer there.    I asked anesthesia PA- C to review.

## 2021-05-31 NOTE — Telephone Encounter (Signed)
FYI

## 2021-05-31 NOTE — Pre-Procedure Instructions (Deleted)
    Davaun Najera Levitt  05/31/2021    Your procedure is scheduled on  Friday, July 29.  Report to [facility] Admitting at 5:30 AM   Call this number if you have problems the morning of surgery:  [phone] this is the Pre- Surgery Desk.   Remember:  Do not eat or drink after midnight.  >>>>>Please send patient's Medication Record with medications administrated documentation. ( this information is required prior to OR. This includes medications that may have been on hold for surgery)<<<<<     Take these medicines the morning of surgery with A SIP OF WATER:  Aspirin            clopidogrel (PLAVIX)             isosorbide mononitrate (IMDUR)             metoprolol tartrate (LOPRESSOR)            NIFEdipine (ADALAT CC)   Take if needed: acetaminophen (TYLENOL)  guaiFENesin (MUCINEX)  STOP taking Aspirin Products (Goody Powder, Excedrin Migraine), Ibuprofen (Advil), Naproxen (Aleve), Vitamins and Herbal Products (ie Fish Oil).   Patient should have a shower, with antibacteria soap, the morning of surgery . Dry off with a clean towel.  Patient should not have lotions, powders, colognes, deodorant, jewelry, or piercing's. Wear clean comfortable clothes.  Brush teeth.   Men may shave face and neck.  Do not bring valuables to the hospital.  [facility] is not responsible for any belongings or valuables.

## 2021-06-01 ENCOUNTER — Inpatient Hospital Stay (HOSPITAL_COMMUNITY): Admission: RE | Admit: 2021-06-01 | Payer: Medicare Other | Source: Home / Self Care | Admitting: Surgery

## 2021-06-01 SURGERY — TRANSCAROTID ARTERY REVASCULARIZATION (TCAR)
Anesthesia: General | Laterality: Left

## 2021-06-04 DIAGNOSIS — I10 Essential (primary) hypertension: Secondary | ICD-10-CM | POA: Diagnosis not present

## 2021-06-04 DIAGNOSIS — I5030 Unspecified diastolic (congestive) heart failure: Secondary | ICD-10-CM | POA: Diagnosis not present

## 2021-06-04 DIAGNOSIS — D509 Iron deficiency anemia, unspecified: Secondary | ICD-10-CM | POA: Diagnosis not present

## 2021-06-04 DIAGNOSIS — I2510 Atherosclerotic heart disease of native coronary artery without angina pectoris: Secondary | ICD-10-CM | POA: Diagnosis not present

## 2021-06-04 DIAGNOSIS — M1990 Unspecified osteoarthritis, unspecified site: Secondary | ICD-10-CM | POA: Diagnosis not present

## 2021-06-04 DIAGNOSIS — I739 Peripheral vascular disease, unspecified: Secondary | ICD-10-CM | POA: Diagnosis not present

## 2021-06-06 ENCOUNTER — Other Ambulatory Visit: Payer: Self-pay

## 2021-06-06 ENCOUNTER — Encounter: Payer: Self-pay | Admitting: Family Medicine

## 2021-06-06 ENCOUNTER — Telehealth: Payer: Self-pay

## 2021-06-06 ENCOUNTER — Inpatient Hospital Stay (HOSPITAL_COMMUNITY): Admission: RE | Admit: 2021-06-06 | Payer: Medicare Other | Source: Ambulatory Visit

## 2021-06-06 ENCOUNTER — Ambulatory Visit (INDEPENDENT_AMBULATORY_CARE_PROVIDER_SITE_OTHER): Payer: Medicare Other | Admitting: Family Medicine

## 2021-06-06 VITALS — BP 138/70 | HR 76 | Temp 98.8°F | Wt 126.0 lb

## 2021-06-06 DIAGNOSIS — I2510 Atherosclerotic heart disease of native coronary artery without angina pectoris: Secondary | ICD-10-CM

## 2021-06-06 DIAGNOSIS — F17200 Nicotine dependence, unspecified, uncomplicated: Secondary | ICD-10-CM | POA: Diagnosis not present

## 2021-06-06 DIAGNOSIS — I10 Essential (primary) hypertension: Secondary | ICD-10-CM | POA: Diagnosis not present

## 2021-06-06 DIAGNOSIS — J439 Emphysema, unspecified: Secondary | ICD-10-CM

## 2021-06-06 DIAGNOSIS — I509 Heart failure, unspecified: Secondary | ICD-10-CM | POA: Diagnosis not present

## 2021-06-06 DIAGNOSIS — N390 Urinary tract infection, site not specified: Secondary | ICD-10-CM | POA: Diagnosis not present

## 2021-06-06 DIAGNOSIS — I6523 Occlusion and stenosis of bilateral carotid arteries: Secondary | ICD-10-CM | POA: Diagnosis not present

## 2021-06-06 DIAGNOSIS — D500 Iron deficiency anemia secondary to blood loss (chronic): Secondary | ICD-10-CM | POA: Diagnosis not present

## 2021-06-06 DIAGNOSIS — R350 Frequency of micturition: Secondary | ICD-10-CM | POA: Diagnosis not present

## 2021-06-06 LAB — POC URINALSYSI DIPSTICK (AUTOMATED)
Bilirubin, UA: NEGATIVE
Glucose, UA: NEGATIVE
Ketones, UA: NEGATIVE
Leukocytes, UA: NEGATIVE
Nitrite, UA: NEGATIVE
Protein, UA: POSITIVE — AB
Spec Grav, UA: 1.025 (ref 1.010–1.025)
Urobilinogen, UA: 0.2 E.U./dL
pH, UA: 6 (ref 5.0–8.0)

## 2021-06-06 MED ORDER — CIPROFLOXACIN HCL 500 MG PO TABS: 500.0000 mg | ORAL_TABLET | Freq: Two times a day (BID) | ORAL | 0 refills | Status: AC

## 2021-06-06 NOTE — Telephone Encounter (Signed)
Pt Pre-Op surgical clearance fom was received, pt had an office visit with Dr Sarajane Jews on 8/3/202, pt is being treated for UTI and will need a f/u urine test before clearance

## 2021-06-06 NOTE — Progress Notes (Signed)
   Subjective:    Patient ID: Earl Castillo, male    DOB: 1941-09-22, 80 y.o.   MRN: [id number]  HPI Here with his niece for a possible UTI and for surgical  clearance. He has been scheduled for a left CEA and carotid revascularization with Dr. Daryll Drown several times, but these have had to be cancelled for various reasons. Today he says for the past 2 weeks he has had urgency to urinate and burning on urination. No fever or nausea. He was seen by Cardiology on 05-09-21 and they cleared him for the surgery from a cardiac standpoint. He had labs on 05-02-21 including a creatinine of 1.08, a glucose of 102, and a Hgb of 12.1. he had an ECHO on 05-09-21 showing an EF of 65-70%. His last CXR on 04-12-21 showed emphysema with small, chronic pleural effusions in both lung bases.    Review of Systems  Constitutional: Negative.   Respiratory: Negative.    Cardiovascular: Negative.   Gastrointestinal: Negative.   Genitourinary:  Positive for dysuria, frequency and urgency. Negative for flank pain and hematuria.  Neurological: Negative.       Objective:   Physical Exam Constitutional:      Comments: Frail, walks with a walker   Cardiovascular:     Rate and Rhythm: Normal rate and regular rhythm.     Pulses: Normal pulses.     Heart sounds: Normal heart sounds.  Pulmonary:     Effort: Pulmonary effort is normal. No respiratory distress.     Breath sounds: No wheezing or rales.     Comments: Soft scattered rhonchi  Abdominal:     General: Abdomen is flat. Bowel sounds are normal. There is no distension.     Palpations: Abdomen is soft. There is no mass.     Tenderness: There is no abdominal tenderness. There is no right CVA tenderness, left CVA tenderness, guarding or rebound.     Hernia: No hernia is present.  Musculoskeletal:     Right lower leg: No edema.     Left lower leg: No edema.  Neurological:     General: No focal deficit present.     Mental Status: He is alert and oriented to  person, place, and time. Mental status is at baseline.          Assessment & Plan:  He seems to be ready for surgery with the exception of a UTI. We will treat with 10 days of Cipro. Culture the sample. We will check a follow up UA after completion of the antibiotic. We spent 35 minutes reviewing records and discussing these issues.  Alysia Penna, MD  Alysia Penna, MD

## 2021-06-07 DIAGNOSIS — I5030 Unspecified diastolic (congestive) heart failure: Secondary | ICD-10-CM | POA: Diagnosis not present

## 2021-06-07 DIAGNOSIS — D509 Iron deficiency anemia, unspecified: Secondary | ICD-10-CM | POA: Diagnosis not present

## 2021-06-07 DIAGNOSIS — I739 Peripheral vascular disease, unspecified: Secondary | ICD-10-CM | POA: Diagnosis not present

## 2021-06-07 DIAGNOSIS — I2510 Atherosclerotic heart disease of native coronary artery without angina pectoris: Secondary | ICD-10-CM | POA: Diagnosis not present

## 2021-06-07 DIAGNOSIS — I10 Essential (primary) hypertension: Secondary | ICD-10-CM | POA: Diagnosis not present

## 2021-06-07 DIAGNOSIS — M1990 Unspecified osteoarthritis, unspecified site: Secondary | ICD-10-CM | POA: Diagnosis not present

## 2021-06-07 LAB — URINE CULTURE
MICRO NUMBER:: 12196600
Result:: NO GROWTH
SPECIMEN QUALITY:: ADEQUATE

## 2021-06-08 DIAGNOSIS — D509 Iron deficiency anemia, unspecified: Secondary | ICD-10-CM | POA: Diagnosis not present

## 2021-06-08 DIAGNOSIS — I739 Peripheral vascular disease, unspecified: Secondary | ICD-10-CM | POA: Diagnosis not present

## 2021-06-08 DIAGNOSIS — I2510 Atherosclerotic heart disease of native coronary artery without angina pectoris: Secondary | ICD-10-CM | POA: Diagnosis not present

## 2021-06-08 DIAGNOSIS — M1990 Unspecified osteoarthritis, unspecified site: Secondary | ICD-10-CM | POA: Diagnosis not present

## 2021-06-08 DIAGNOSIS — I5030 Unspecified diastolic (congestive) heart failure: Secondary | ICD-10-CM | POA: Diagnosis not present

## 2021-06-08 DIAGNOSIS — I10 Essential (primary) hypertension: Secondary | ICD-10-CM | POA: Diagnosis not present

## 2021-06-12 ENCOUNTER — Telehealth (INDEPENDENT_AMBULATORY_CARE_PROVIDER_SITE_OTHER): Payer: Medicare Other | Admitting: Family Medicine

## 2021-06-12 ENCOUNTER — Encounter: Payer: Self-pay | Admitting: Family Medicine

## 2021-06-12 DIAGNOSIS — I6523 Occlusion and stenosis of bilateral carotid arteries: Secondary | ICD-10-CM | POA: Diagnosis not present

## 2021-06-12 DIAGNOSIS — U071 COVID-19: Secondary | ICD-10-CM | POA: Diagnosis not present

## 2021-06-12 MED ORDER — NIRMATRELVIR/RITONAVIR (PAXLOVID)TABLET: 3.0000 | ORAL_TABLET | Freq: Two times a day (BID) | ORAL | 0 refills | Status: AC

## 2021-06-12 NOTE — Progress Notes (Signed)
Subjective:    Patient ID: Earl Castillo, male    DOB: Dec 28, 1940, 80 y.o.   MRN: [id number]  HPI Virtual Visit via Video Note  I connected with the patient on 06/12/21 at  1:15 PM EDT by a video enabled telemedicine application and verified that I am speaking with the correct person using two identifiers.  Location patient: home Location provider:work or home office Persons participating in the virtual visit: patient, provider  I discussed the limitations of evaluation and management by telemedicine and the availability of in person appointments. The patient expressed understanding and agreed to proceed.   HPI: Here for a Covid-19 infection. Yesterday morning he developed a stuffy nose, head congestion, and a dry cough. No SOB or fever, no body aches or NVD. Last night he tested positive for the Covid virus. He is drinking fluids.    ROS: See pertinent positives and negatives per HPI.  Past Medical History:  Diagnosis Date   Anemia    Angiodysplasia of intestine - small bowel  09/21/2013   Blood transfusion abn reaction or complication, no procedure mishap    "HgB dropped real low"   Cancer ([place])    Skin cancers   Carotid artery disease ([place])    status post carotid endarterectomy by [name]   CHF (congestive heart failure) ([place]) 04/09/2021   new diagnosis   Chicken pox    Colon polyps 08/2012   Hyperplastic and adenomatous sessile polyps   Coronary artery disease    CABG'04, Low risk Myoview 2013   GERD (gastroesophageal reflux disease)    Hemorrhoid    History of blood transfusion 08/2013   "HgB dropped real low; related to bleeding from my rectum"   History of kidney stones    1 removed surgical, passed 2   Hyperlipidemia    Hypertension    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 08/17/2013   Lower GI bleeding 08/2013   Osteoarthritis    "hips" (05/23/2014)   Peripheral arterial disease ([place])    status post  left to right fem-fem crossover graft in by [name]   Pneumonia    - "years ago"   Sinus headache    "alot" (05/23/2014)   Skin exam, screening for cancer    sees [name]    Stroke [facility])    Vitamin B12 deficiency 11/23/2013    Past Surgical History:  Procedure Laterality Date   BAND HEMORRHOIDECTOMY     BASAL CELL CARCINOMA EXCISION  May 2009   per [name] , left nose    basket extraction of ureteral stone  1970's   Bypass rt leg Right 2000   capsule endoscopy  10-05-13   per [name], 4 AVM's   CARDIAC CATHETERIZATION  08/18/2003   Recommend CABG   CARDIOVASCULAR STRESS TEST - LEXISCAN  08/25/2012   No significant wall motion abnormalities   CAROTID ANGIOGRAM N/A 03/17/2014   Procedure: CAROTID ANGIOGRAM;  Surgeon: [name], MD;  Location[facility] C[facility] LAB;  Service: Cardiovascular;  Laterality: N/A;  right carotid   CAROTID ANGIOGRAPHY N/A 04/06/2021   Procedure: CAROTID ANGIOGRAPHY;  Surgeon: Elam Dutch, MD;  Location: [place] CV LAB;  Service: Cardiovascular;  Laterality: N/A;   CAROTID ENDARTERECTOMY Right    CAROTID ENDARTERECTOMY Right 03/26/2005   Dr Georgina Quint   CATARACT EXTRACTION W/ INTRAOCULAR LENS  IMPLANT, BILATERAL Bilateral 2014   per [name]   CERVICAL DISC  SURGERY  1978   "bulging disc"   COLONOSCOPY  08-12-12   per [name], tubular adenomas, repeat in 5 yrs    CORONARY ARTERY BYPASS GRAFT  08/30/2003   x3. LIMA-distal LAD, RIMA-circumflex, SVG-distal RCA   CYSTOSCOPY W/ STONE MANIPULATION     ESOPHAGOGASTRODUODENOSCOPY N/A 08/17/2013   Procedure: ESOPHAGOGASTRODUODENOSCOPY (EGD);  Surgeon: [name], MD;  Location: [facility] ENDOSCOPY;  Service: Endoscopy;  Laterality: N/A;   FEMORAL ARTERY STENT Left 05/23/2014   FEMORAL-FEMORAL BYPASS GRAFT  2003   FEMORAL-POPLITEAL BYPASS GRAFT Left 08/09/2016   Procedure: BYPASS GRAFT LEFT FEMORAL-POPLITEAL ARTERY USING GORE 6MM X 80CM PROPATEN GRAFT;  Surgeon: Serafina Mitchell,  MD;  Location: [place];  Service: Vascular;  Laterality: Left;   LOWER EXTREMITY ANGIOGRAM N/A 03/17/2014   Procedure: LOWER EXTREMITY ANGIOGRAM;  Surgeon: [name], MD;  Location: [facility] CATH LAB;  Service: Cardiovascular;  Laterality: N/A;   PERIPHERAL VASCULAR CATHETERIZATION N/A 06/06/2016   Procedure: Lower Extremity Angiography;  Surgeon: [name], MD;  Location: [place] CV LAB;  Service: Cardiovascular;  Laterality: N/A;   PERIPHERAL VASCULAR CATHETERIZATION N/A 06/06/2016   Procedure: Abdominal Aortogram;  Surgeon: [name], MD;  Location: [place] CV LAB;  Service: Cardiovascular;  Laterality: N/A;   RENAL DUPLEX Bilateral 02/02/2013   Bilateral renal arteries demonstrate narrowingconsistent with a 60-99% diameter reduction.   TRANSTHORACIC ECHOCARDIOGRAM  08/02/2013   EF 55-60%, normal echocardiogram    Family History  Problem Relation Age of Onset   Heart disease Father    Heart disease Sister    Coronary artery disease Brother        CABG x 6   Heart disease Brother    Coronary artery disease Brother        first MI 31, died at 58 during cardiac surgery   Coronary artery disease Sister        CABG x 5, RAS   Heart disease Sister    Colon cancer Neg Hx      Current Outpatient Medications:    acetaminophen (TYLENOL) 500 MG tablet, Take 1,000 mg by mouth daily as needed for moderate pain (for leg pain)., Disp: , Rfl:    aspirin EC 81 MG tablet, Take 81 mg by mouth every evening. , Disp: , Rfl:    ciprofloxacin (CIPRO) 500 MG tablet, Take 1 tablet (500 mg total) by mouth 2 (two) times daily., Disp: 20 tablet, Rfl: 0   clopidogrel (PLAVIX) 75 MG tablet, TAKE 1 TABLET BY MOUTH EVERY DAY (Patient taking differently: Take 75 mg by mouth daily.), Disp: 90 tablet, Rfl: 3   docusate sodium (COLACE) 100 MG capsule, Take 100 mg by mouth daily as needed (for constipation). , Disp: , Rfl:    ferrous gluconate (FERGON) 324 MG tablet, TAKE 1 TABLET BY MOUTH EVERY DAY  WITH BREAKFAST. (Patient taking differently: Take 324 mg by mouth daily with breakfast. TAKE 1 TABLET BY MOUTH EVERY DAY WITH BREAKFAST.), Disp: 90 tablet, Rfl: 0   guaiFENesin (MUCINEX) 600 MG 12 hr tablet, Take 600 mg by mouth 2 (two) times daily as needed for to loosen phlegm or cough., Disp: , Rfl:    hydrochlorothiazide (HYDRODIURIL) 25 MG tablet, Take 25 mg by mouth daily., Disp: , Rfl:    isosorbide mononitrate (IMDUR) 30 MG 24 hr tablet, TAKE 1 TABLET (30 MG TOTAL) BY MOUTH DAILY. NEED OV., Disp: 90 tablet, Rfl: 3   metoprolol tartrate (LOPRESSOR) 50 MG tablet, TAKE 1 TABLET BY  MOUTH EVERY DAY (Patient taking differently: Take 50 mg by mouth daily.), Disp: 90 tablet, Rfl: 1   NIFEdipine (ADALAT CC) 90 MG 24 hr tablet, TAKE 1 TABLET BY MOUTH EVERY DAY (Patient taking differently: Take 90 mg by mouth daily. TAKE 1 TABLET BY MOUTH EVERY DAY), Disp: 90 tablet, Rfl: 3   simvastatin (ZOCOR) 20 MG tablet, TAKE 1 TABLET (20 MG TOTAL) BY MOUTH DAILY AT 6 PM. NEED OV., Disp: 90 tablet, Rfl: 1   vitamin B-12 (CYANOCOBALAMIN) 500 MCG tablet, Take 500 mcg by mouth in the morning., Disp: , Rfl:   EXAM:  VITALS per patient if applicable:  GENERAL: alert, oriented, appears well and in no acute distress  HEENT: atraumatic, conjunttiva clear, no obvious abnormalities on inspection of external nose and ears  NECK: normal movements of the head and neck  LUNGS: on inspection no signs of respiratory distress, breathing rate appears normal, no obvious gross SOB, gasping or wheezing  CV: no obvious cyanosis  MS: moves all visible extremities without noticeable abnormality  PSYCH/NEURO: pleasant and cooperative, no obvious depression or anxiety, speech and thought processing grossly intact  ASSESSMENT AND PLAN: Covid-19 infection. We will treat with 5 days of Paxlovid. Follow up as needed. Alysia Penna, MD  Discussed the following assessment and plan:  No diagnosis found.     I discussed the  assessment and treatment plan with the patient. The patient was provided an opportunity to ask questions and all were answered. The patient agreed with the plan and demonstrated an understanding of the instructions.   The patient was advised to call back or seek an in-person evaluation if the symptoms worsen or if the condition fails to improve as anticipated.      Review of Systems     Objective:   Physical Exam        Assessment & Plan:

## 2021-06-14 ENCOUNTER — Emergency Department (HOSPITAL_COMMUNITY): Payer: Medicare Other

## 2021-06-14 ENCOUNTER — Encounter (HOSPITAL_COMMUNITY): Payer: Self-pay | Admitting: Emergency Medicine

## 2021-06-14 ENCOUNTER — Inpatient Hospital Stay (HOSPITAL_COMMUNITY)
Admission: EM | Admit: 2021-06-14 | Discharge: 2021-07-05 | DRG: 207 | Disposition: E | Payer: Medicare Other | Attending: Pulmonary Disease | Admitting: Pulmonary Disease

## 2021-06-14 DIAGNOSIS — J80 Acute respiratory distress syndrome: Secondary | ICD-10-CM | POA: Diagnosis not present

## 2021-06-14 DIAGNOSIS — R627 Adult failure to thrive: Secondary | ICD-10-CM | POA: Diagnosis present

## 2021-06-14 DIAGNOSIS — D509 Iron deficiency anemia, unspecified: Secondary | ICD-10-CM | POA: Diagnosis present

## 2021-06-14 DIAGNOSIS — E1122 Type 2 diabetes mellitus with diabetic chronic kidney disease: Secondary | ICD-10-CM | POA: Diagnosis present

## 2021-06-14 DIAGNOSIS — J1282 Pneumonia due to coronavirus disease 2019: Secondary | ICD-10-CM | POA: Diagnosis present

## 2021-06-14 DIAGNOSIS — J811 Chronic pulmonary edema: Secondary | ICD-10-CM | POA: Diagnosis not present

## 2021-06-14 DIAGNOSIS — E43 Unspecified severe protein-calorie malnutrition: Secondary | ICD-10-CM | POA: Diagnosis present

## 2021-06-14 DIAGNOSIS — K8020 Calculus of gallbladder without cholecystitis without obstruction: Secondary | ICD-10-CM | POA: Diagnosis not present

## 2021-06-14 DIAGNOSIS — R7989 Other specified abnormal findings of blood chemistry: Secondary | ICD-10-CM | POA: Diagnosis not present

## 2021-06-14 DIAGNOSIS — I700 Atherosclerosis of aorta: Secondary | ICD-10-CM | POA: Diagnosis not present

## 2021-06-14 DIAGNOSIS — I469 Cardiac arrest, cause unspecified: Secondary | ICD-10-CM | POA: Diagnosis not present

## 2021-06-14 DIAGNOSIS — J690 Pneumonitis due to inhalation of food and vomit: Secondary | ICD-10-CM | POA: Diagnosis not present

## 2021-06-14 DIAGNOSIS — N179 Acute kidney failure, unspecified: Secondary | ICD-10-CM | POA: Diagnosis present

## 2021-06-14 DIAGNOSIS — J189 Pneumonia, unspecified organism: Secondary | ICD-10-CM | POA: Diagnosis not present

## 2021-06-14 DIAGNOSIS — E1165 Type 2 diabetes mellitus with hyperglycemia: Secondary | ICD-10-CM | POA: Diagnosis present

## 2021-06-14 DIAGNOSIS — Z28310 Unvaccinated for covid-19: Secondary | ICD-10-CM

## 2021-06-14 DIAGNOSIS — G931 Anoxic brain damage, not elsewhere classified: Secondary | ICD-10-CM | POA: Diagnosis not present

## 2021-06-14 DIAGNOSIS — D631 Anemia in chronic kidney disease: Secondary | ICD-10-CM | POA: Diagnosis present

## 2021-06-14 DIAGNOSIS — E1151 Type 2 diabetes mellitus with diabetic peripheral angiopathy without gangrene: Secondary | ICD-10-CM | POA: Diagnosis present

## 2021-06-14 DIAGNOSIS — R54 Age-related physical debility: Secondary | ICD-10-CM | POA: Diagnosis present

## 2021-06-14 DIAGNOSIS — U071 COVID-19: Principal | ICD-10-CM | POA: Diagnosis present

## 2021-06-14 DIAGNOSIS — Z2821 Immunization not carried out because of patient refusal: Secondary | ICD-10-CM | POA: Diagnosis present

## 2021-06-14 DIAGNOSIS — Z66 Do not resuscitate: Secondary | ICD-10-CM | POA: Diagnosis present

## 2021-06-14 DIAGNOSIS — I10 Essential (primary) hypertension: Secondary | ICD-10-CM | POA: Diagnosis present

## 2021-06-14 DIAGNOSIS — Z978 Presence of other specified devices: Secondary | ICD-10-CM

## 2021-06-14 DIAGNOSIS — R64 Cachexia: Secondary | ICD-10-CM | POA: Diagnosis present

## 2021-06-14 DIAGNOSIS — E872 Acidosis: Secondary | ICD-10-CM | POA: Diagnosis present

## 2021-06-14 DIAGNOSIS — N189 Chronic kidney disease, unspecified: Secondary | ICD-10-CM | POA: Diagnosis present

## 2021-06-14 DIAGNOSIS — I4891 Unspecified atrial fibrillation: Secondary | ICD-10-CM | POA: Diagnosis not present

## 2021-06-14 DIAGNOSIS — H9190 Unspecified hearing loss, unspecified ear: Secondary | ICD-10-CM | POA: Diagnosis present

## 2021-06-14 DIAGNOSIS — Z7401 Bed confinement status: Secondary | ICD-10-CM

## 2021-06-14 DIAGNOSIS — Z7902 Long term (current) use of antithrombotics/antiplatelets: Secondary | ICD-10-CM

## 2021-06-14 DIAGNOSIS — E785 Hyperlipidemia, unspecified: Secondary | ICD-10-CM | POA: Diagnosis present

## 2021-06-14 DIAGNOSIS — G928 Other toxic encephalopathy: Secondary | ICD-10-CM | POA: Diagnosis present

## 2021-06-14 DIAGNOSIS — Z515 Encounter for palliative care: Secondary | ICD-10-CM | POA: Diagnosis not present

## 2021-06-14 DIAGNOSIS — R059 Cough, unspecified: Secondary | ICD-10-CM | POA: Diagnosis not present

## 2021-06-14 DIAGNOSIS — I672 Cerebral atherosclerosis: Secondary | ICD-10-CM | POA: Diagnosis present

## 2021-06-14 DIAGNOSIS — I9789 Other postprocedural complications and disorders of the circulatory system, not elsewhere classified: Secondary | ICD-10-CM | POA: Diagnosis not present

## 2021-06-14 DIAGNOSIS — E876 Hypokalemia: Secondary | ICD-10-CM | POA: Diagnosis not present

## 2021-06-14 DIAGNOSIS — R296 Repeated falls: Secondary | ICD-10-CM | POA: Diagnosis present

## 2021-06-14 DIAGNOSIS — J9601 Acute respiratory failure with hypoxia: Secondary | ICD-10-CM | POA: Diagnosis not present

## 2021-06-14 DIAGNOSIS — K219 Gastro-esophageal reflux disease without esophagitis: Secondary | ICD-10-CM | POA: Diagnosis present

## 2021-06-14 DIAGNOSIS — I6522 Occlusion and stenosis of left carotid artery: Secondary | ICD-10-CM | POA: Diagnosis present

## 2021-06-14 DIAGNOSIS — Z681 Body mass index (BMI) 19 or less, adult: Secondary | ICD-10-CM

## 2021-06-14 DIAGNOSIS — Z0189 Encounter for other specified special examinations: Secondary | ICD-10-CM

## 2021-06-14 DIAGNOSIS — I130 Hypertensive heart and chronic kidney disease with heart failure and stage 1 through stage 4 chronic kidney disease, or unspecified chronic kidney disease: Secondary | ICD-10-CM | POA: Diagnosis present

## 2021-06-14 DIAGNOSIS — R0902 Hypoxemia: Secondary | ICD-10-CM | POA: Diagnosis not present

## 2021-06-14 DIAGNOSIS — Z4682 Encounter for fitting and adjustment of non-vascular catheter: Secondary | ICD-10-CM | POA: Diagnosis not present

## 2021-06-14 DIAGNOSIS — Z8249 Family history of ischemic heart disease and other diseases of the circulatory system: Secondary | ICD-10-CM

## 2021-06-14 DIAGNOSIS — G9340 Encephalopathy, unspecified: Secondary | ICD-10-CM | POA: Diagnosis not present

## 2021-06-14 DIAGNOSIS — I468 Cardiac arrest due to other underlying condition: Secondary | ICD-10-CM | POA: Diagnosis not present

## 2021-06-14 DIAGNOSIS — Z79899 Other long term (current) drug therapy: Secondary | ICD-10-CM

## 2021-06-14 DIAGNOSIS — Z8673 Personal history of transient ischemic attack (TIA), and cerebral infarction without residual deficits: Secondary | ICD-10-CM

## 2021-06-14 DIAGNOSIS — J90 Pleural effusion, not elsewhere classified: Secondary | ICD-10-CM | POA: Diagnosis not present

## 2021-06-14 DIAGNOSIS — J984 Other disorders of lung: Secondary | ICD-10-CM | POA: Diagnosis not present

## 2021-06-14 DIAGNOSIS — Z7982 Long term (current) use of aspirin: Secondary | ICD-10-CM

## 2021-06-14 DIAGNOSIS — F17210 Nicotine dependence, cigarettes, uncomplicated: Secondary | ICD-10-CM | POA: Diagnosis present

## 2021-06-14 DIAGNOSIS — I5032 Chronic diastolic (congestive) heart failure: Secondary | ICD-10-CM | POA: Diagnosis present

## 2021-06-14 DIAGNOSIS — R29818 Other symptoms and signs involving the nervous system: Secondary | ICD-10-CM | POA: Diagnosis not present

## 2021-06-14 DIAGNOSIS — R918 Other nonspecific abnormal finding of lung field: Secondary | ICD-10-CM | POA: Diagnosis not present

## 2021-06-14 DIAGNOSIS — R531 Weakness: Secondary | ICD-10-CM | POA: Diagnosis not present

## 2021-06-14 DIAGNOSIS — Z85828 Personal history of other malignant neoplasm of skin: Secondary | ICD-10-CM

## 2021-06-14 DIAGNOSIS — Z951 Presence of aortocoronary bypass graft: Secondary | ICD-10-CM

## 2021-06-14 DIAGNOSIS — I739 Peripheral vascular disease, unspecified: Secondary | ICD-10-CM | POA: Diagnosis present

## 2021-06-14 DIAGNOSIS — J439 Emphysema, unspecified: Secondary | ICD-10-CM | POA: Diagnosis present

## 2021-06-14 DIAGNOSIS — I2510 Atherosclerotic heart disease of native coronary artery without angina pectoris: Secondary | ICD-10-CM | POA: Diagnosis present

## 2021-06-14 DIAGNOSIS — I779 Disorder of arteries and arterioles, unspecified: Secondary | ICD-10-CM | POA: Diagnosis present

## 2021-06-14 LAB — URINALYSIS, ROUTINE W REFLEX MICROSCOPIC
Bilirubin Urine: NEGATIVE
Glucose, UA: NEGATIVE mg/dL
Hgb urine dipstick: NEGATIVE
Ketones, ur: NEGATIVE mg/dL
Leukocytes,Ua: NEGATIVE
Nitrite: NEGATIVE
Protein, ur: >=300 mg/dL — AB
Specific Gravity, Urine: 1.015 (ref 1.005–1.030)
pH: 5 (ref 5.0–8.0)

## 2021-06-14 LAB — CBC WITH DIFFERENTIAL/PLATELET
Abs Immature Granulocytes: 0.07 10*3/uL (ref 0.00–0.07)
Basophils Absolute: 0.1 10*3/uL (ref 0.0–0.1)
Basophils Relative: 0 %
Eosinophils Absolute: 0 10*3/uL (ref 0.0–0.5)
Eosinophils Relative: 0 %
HCT: 38.7 % — ABNORMAL LOW (ref 39.0–52.0)
Hemoglobin: 12.6 g/dL — ABNORMAL LOW (ref 13.0–17.0)
Immature Granulocytes: 1 %
Lymphocytes Relative: 11 %
Lymphs Abs: 1.6 10*3/uL (ref 0.7–4.0)
MCH: 31.3 pg (ref 26.0–34.0)
MCHC: 32.6 g/dL (ref 30.0–36.0)
MCV: 96 fL (ref 80.0–100.0)
Monocytes Absolute: 1.3 10*3/uL — ABNORMAL HIGH (ref 0.1–1.0)
Monocytes Relative: 9 %
Neutro Abs: 11.3 10*3/uL — ABNORMAL HIGH (ref 1.7–7.7)
Neutrophils Relative %: 79 %
Platelets: 337 10*3/uL (ref 150–400)
RBC: 4.03 MIL/uL — ABNORMAL LOW (ref 4.22–5.81)
RDW: 13.2 % (ref 11.5–15.5)
WBC: 14.4 10*3/uL — ABNORMAL HIGH (ref 4.0–10.5)
nRBC: 0 % (ref 0.0–0.2)

## 2021-06-14 LAB — D-DIMER, QUANTITATIVE: D-Dimer, Quant: 1.72 ug/mL-FEU — ABNORMAL HIGH (ref 0.00–0.50)

## 2021-06-14 LAB — COMPREHENSIVE METABOLIC PANEL
ALT: 10 U/L (ref 0–44)
AST: 16 U/L (ref 15–41)
Albumin: 3.2 g/dL — ABNORMAL LOW (ref 3.5–5.0)
Alkaline Phosphatase: 59 U/L (ref 38–126)
Anion gap: 10 (ref 5–15)
BUN: 32 mg/dL — ABNORMAL HIGH (ref 8–23)
CO2: 28 mmol/L (ref 22–32)
Calcium: 9 mg/dL (ref 8.9–10.3)
Chloride: 100 mmol/L (ref 98–111)
Creatinine, Ser: 1.3 mg/dL — ABNORMAL HIGH (ref 0.61–1.24)
GFR, Estimated: 56 mL/min — ABNORMAL LOW (ref >60)
Glucose, Bld: 155 mg/dL — ABNORMAL HIGH (ref 70–99)
Potassium: 2.9 mmol/L — ABNORMAL LOW (ref 3.5–5.1)
Sodium: 138 mmol/L (ref 135–145)
Total Bilirubin: 0.6 mg/dL (ref 0.3–1.2)
Total Protein: 7.9 g/dL (ref 6.5–8.1)

## 2021-06-14 LAB — RESP PANEL BY RT-PCR (FLU A&B, COVID) ARPGX2
Influenza A by PCR: NEGATIVE
Influenza B by PCR: NEGATIVE
SARS Coronavirus 2 by RT PCR: POSITIVE — AB

## 2021-06-14 LAB — PROCALCITONIN: Procalcitonin: <0.1 ng/mL

## 2021-06-14 LAB — TROPONIN I (HIGH SENSITIVITY)
Troponin I (High Sensitivity): 27 ng/L — ABNORMAL HIGH (ref <18)
Troponin I (High Sensitivity): 26 ng/L — ABNORMAL HIGH (ref <18)

## 2021-06-14 LAB — STREP PNEUMONIAE URINARY ANTIGEN: Strep Pneumo Urinary Antigen: NEGATIVE

## 2021-06-14 MED ORDER — ONDANSETRON HCL 4 MG PO TABS: 4.0000 mg | ORAL_TABLET | Freq: Four times a day (QID) | ORAL | Status: DC | PRN

## 2021-06-14 MED ORDER — ONDANSETRON HCL 4 MG/2ML IJ SOLN: 4.0000 mg | Freq: Four times a day (QID) | INTRAMUSCULAR | Status: DC | PRN

## 2021-06-14 MED ORDER — NIRMATRELVIR/RITONAVIR (PAXLOVID)TABLET: 3.0000 | ORAL_TABLET | Freq: Two times a day (BID) | ORAL | Status: DC

## 2021-06-14 MED ORDER — NIFEDIPINE ER OSMOTIC RELEASE 60 MG PO TB24
90.0000 mg | ORAL_TABLET | Freq: Every day | ORAL | Status: DC
  Administered 2021-06-15: 90 mg via ORAL
  Filled 2021-06-14 (×2): qty 1

## 2021-06-14 MED ORDER — ENOXAPARIN SODIUM 40 MG/0.4ML IJ SOSY
40.0000 mg | PREFILLED_SYRINGE | INTRAMUSCULAR | Status: DC
  Administered 2021-06-14: 40 mg via SUBCUTANEOUS
  Filled 2021-06-14: qty 0.4

## 2021-06-14 MED ORDER — SODIUM CHLORIDE 0.9 % IV SOLN
250.0000 mL | INTRAVENOUS | Status: DC | PRN
  Administered 2021-06-18 (×2): 250 mL via INTRAVENOUS

## 2021-06-14 MED ORDER — SODIUM CHLORIDE 0.9 % IV SOLN
2.0000 g | INTRAVENOUS | Status: DC
  Administered 2021-06-14: 2 g via INTRAVENOUS
  Filled 2021-06-14: qty 20
  Filled 2021-06-14: qty 2

## 2021-06-14 MED ORDER — AZITHROMYCIN 250 MG PO TABS
500.0000 mg | ORAL_TABLET | Freq: Every day | ORAL | Status: DC
  Administered 2021-06-14 – 2021-06-15 (×2): 500 mg via ORAL
  Filled 2021-06-14 (×2): qty 2

## 2021-06-14 MED ORDER — ISOSORBIDE MONONITRATE ER 30 MG PO TB24
30.0000 mg | ORAL_TABLET | Freq: Every day | ORAL | Status: DC
  Administered 2021-06-15: 30 mg via ORAL
  Filled 2021-06-14: qty 1

## 2021-06-14 MED ORDER — SODIUM CHLORIDE 0.9% FLUSH: 3.0000 mL | INTRAVENOUS | Status: DC | PRN

## 2021-06-14 MED ORDER — CYANOCOBALAMIN 500 MCG PO TABS
500.0000 ug | ORAL_TABLET | Freq: Every day | ORAL | Status: DC
  Administered 2021-06-15: 500 ug via ORAL
  Filled 2021-06-14 (×2): qty 1

## 2021-06-14 MED ORDER — METOPROLOL TARTRATE 25 MG PO TABS: 50.0000 mg | ORAL_TABLET | Freq: Every day | ORAL | Status: DC

## 2021-06-14 MED ORDER — IPRATROPIUM-ALBUTEROL 20-100 MCG/ACT IN AERS
1.0000 | INHALATION_SPRAY | Freq: Four times a day (QID) | RESPIRATORY_TRACT | Status: DC
Start: 2021-06-14 — End: 2021-06-16
  Administered 2021-06-14 – 2021-06-16 (×6): 1 via RESPIRATORY_TRACT
  Filled 2021-06-14: qty 4

## 2021-06-14 MED ORDER — CLOPIDOGREL BISULFATE 75 MG PO TABS
75.0000 mg | ORAL_TABLET | Freq: Every day | ORAL | Status: DC
  Administered 2021-06-15: 75 mg via ORAL
  Filled 2021-06-14: qty 1

## 2021-06-14 MED ORDER — SODIUM CHLORIDE 0.9% FLUSH
3.0000 mL | Freq: Two times a day (BID) | INTRAVENOUS | Status: DC
  Administered 2021-06-14 – 2021-06-20 (×12): 3 mL via INTRAVENOUS

## 2021-06-14 MED ORDER — ACETAMINOPHEN 325 MG PO TABS: 650.0000 mg | ORAL_TABLET | Freq: Four times a day (QID) | ORAL | Status: DC | PRN

## 2021-06-14 MED ORDER — POTASSIUM CHLORIDE CRYS ER 20 MEQ PO TBCR
40.0000 meq | EXTENDED_RELEASE_TABLET | Freq: Two times a day (BID) | ORAL | Status: DC
  Administered 2021-06-14 – 2021-06-15 (×3): 40 meq via ORAL
  Filled 2021-06-14 (×3): qty 2

## 2021-06-14 MED ORDER — POLYETHYLENE GLYCOL 3350 17 G PO PACK: 17.0000 g | PACK | Freq: Every day | ORAL | Status: DC | PRN

## 2021-06-14 MED ORDER — SODIUM CHLORIDE 0.9 % IV BOLUS
500.0000 mL | Freq: Once | INTRAVENOUS | Status: AC
  Administered 2021-06-14: 500 mL via INTRAVENOUS

## 2021-06-14 MED ORDER — FERROUS GLUCONATE 324 (38 FE) MG PO TABS
324.0000 mg | ORAL_TABLET | Freq: Every day | ORAL | Status: DC
  Administered 2021-06-15: 324 mg via ORAL
  Filled 2021-06-14: qty 1

## 2021-06-14 MED ORDER — NIRMATRELVIR/RITONAVIR (PAXLOVID) TABLET (RENAL DOSING)
2.0000 | ORAL_TABLET | Freq: Two times a day (BID) | ORAL | Status: DC
  Administered 2021-06-14 – 2021-06-15 (×3): 2 via ORAL
  Filled 2021-06-14: qty 20

## 2021-06-14 MED ORDER — METOPROLOL TARTRATE 50 MG PO TABS
50.0000 mg | ORAL_TABLET | Freq: Every day | ORAL | Status: DC
  Administered 2021-06-15: 50 mg via ORAL
  Filled 2021-06-14: qty 1

## 2021-06-14 MED ORDER — ASPIRIN EC 81 MG PO TBEC
81.0000 mg | DELAYED_RELEASE_TABLET | Freq: Every evening | ORAL | Status: DC
  Administered 2021-06-14 – 2021-06-15 (×2): 81 mg via ORAL
  Filled 2021-06-14 (×2): qty 1

## 2021-06-14 MED ORDER — GUAIFENESIN-DM 100-10 MG/5ML PO SYRP: 10.0000 mL | ORAL_SOLUTION | ORAL | Status: DC | PRN

## 2021-06-14 NOTE — ED Notes (Signed)
ED Provider at bedside. 

## 2021-06-14 NOTE — H&P (Signed)
History and Physical    Earl Castillo [id number] DOB: 03-16-1941 DOA: 06/04/2021  PCP: [name], MD  Patient coming from: Home   I have personally briefly reviewed patient's old medical records available.   Chief Complaint: could not get out of the couch , too weak for last few days  HPI: Earl Castillo is a 80 y.o. male with medical history significant of extensive medical issues including chronic anemia due to iron deficiency, diastolic heart failure, hypertension, hyperlipidemia, severe PAD and ambulatory , GERD , B12 deficiency presents to hospital with extreme weakness and cough and congestion.  Patient lives at home with his son.  According to the patient, his son was diagnosed with COVID last week, he himself started having some cough and congestion symptoms Monday morning.  Home COVID test was positive.  Primary care physician Dr. Sarajane Jews called him Paxlovid and taken first dose last night.  Today morning, he could not get up and walk or take his steps with his walker so brought to the ER.  He almost fell to the ground but denies any trauma.  Patient himself denies any shortness of breath, chest pain.  He was having congestion and dry cough.  No reported fever. ED Course: Blood pressure is stable.  Briefly on 2 L of oxygen.  WC count 14.4.  Potassium 2.9.  Creatinine 1.3 which is more than baseline of 1.  Chest x-ray with minimal bilateral airspace disease, no evidence of consolidation.  COVID-19 test positive.  Patient was given potassium supplement, 500 mL of IV fluids and admission requested due to significant symptoms. I called and discussed with patient's son and supplemental history was taken.  Patient does have severe peripheral artery disease and ambulatory dysfunction.  He manages to walk in the house with a walker.  Review of Systems: all systems are reviewed and pertinent positive as per HPI otherwise rest are negative.    Past Medical History:  Diagnosis Date   Anemia     Angiodysplasia of intestine - small bowel  09/21/2013   Blood transfusion abn reaction or complication, no procedure mishap    "HgB dropped real low"   Cancer ([place])    Skin cancers   Carotid artery disease ([place])    status post carotid endarterectomy by [name]   CHF (congestive heart failure) ([place]) 04/09/2021   new diagnosis   Chicken pox    Colon polyps 08/2012   Hyperplastic and adenomatous sessile polyps   Coronary artery disease    CABG'04, Low risk Myoview 2013   GERD (gastroesophageal reflux disease)    Hemorrhoid    History of blood transfusion 08/2013   "HgB dropped real low; related to bleeding from my rectum"   History of kidney stones    1 removed surgical, passed 2   Hyperlipidemia    Hypertension    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 08/17/2013   Lower GI bleeding 08/2013   Osteoarthritis    "hips" (05/23/2014)   Peripheral arterial disease ([place])    status post left to right fem-fem crossover graft in by [name]   Pneumonia    - "years ago"   Sinus headache    "alot" (05/23/2014)   Skin exam, screening for cancer    sees [name]    Stroke [facility])    Vitamin B12 deficiency 11/23/2013    Past Surgical History:  Procedure Laterality Date   BAND HEMORRHOIDECTOMY  BASAL CELL CARCINOMA EXCISION  May 2009   per [name] , left nose    basket extraction of ureteral stone  1970's   Bypass rt leg Right 2000   capsule endoscopy  10-05-13   per [name], 4 AVM's   CARDIAC CATHETERIZATION  08/18/2003   Recommend CABG   CARDIOVASCULAR STRESS TEST - LEXISCAN  08/25/2012   No significant wall motion abnormalities   CAROTID ANGIOGRAM N/A 03/17/2014   Procedure: CAROTID Cyril Loosen;  Surgeon: [name], MD;  Location: [facility] CATH LAB;  Service: Cardiovascular;  Laterality: N/A;  right carotid   CAROTID ANGIOGRAPHY N/A 04/06/2021   Procedure: CAROTID ANGIOGRAPHY;  Surgeon: Elam Dutch, MD;  Location: [place] CV LAB;  Service: Cardiovascular;  Laterality: N/A;   CAROTID ENDARTERECTOMY Right    CAROTID ENDARTERECTOMY Right 03/26/2005   Dr Georgina Quint   CATARACT EXTRACTION W/ INTRAOCULAR LENS  IMPLANT, BILATERAL Bilateral 2014   per [name]   CERVICAL DISC SURGERY  1978   "bulging disc"   COLONOSCOPY  08-12-12   per [name], tubular adenomas, repeat in 5 yrs    CORONARY ARTERY BYPASS GRAFT  08/30/2003   x3. LIMA-distal LAD, RIMA-circumflex, SVG-distal RCA   CYSTOSCOPY W/ STONE MANIPULATION     ESOPHAGOGASTRODUODENOSCOPY N/A 08/17/2013   Procedure: ESOPHAGOGASTRODUODENOSCOPY (EGD);  Surgeon: [name], MD;  Location: [facility] ENDOSCOPY;  Service: Endoscopy;  Laterality: N/A;   FEMORAL ARTERY STENT Left 05/23/2014   FEMORAL-FEMORAL BYPASS GRAFT  2003   FEMORAL-POPLITEAL BYPASS GRAFT Left 08/09/2016   Procedure: BYPASS GRAFT LEFT FEMORAL-POPLITEAL ARTERY USING GORE 6MM X 80CM PROPATEN GRAFT;  Surgeon: Serafina Mitchell, MD;  Location: [place];  Service: Vascular;  Laterality: Left;   LOWER EXTREMITY ANGIOGRAM N/A 03/17/2014   Procedure: LOWER EXTREMITY ANGIOGRAM;  Surgeon: [name], MD;  Location: [facility] CATH LAB;  Service: Cardiovascular;  Laterality: N/A;   PERIPHERAL VASCULAR CATHETERIZATION N/A 06/06/2016   Procedure: Lower Extremity Angiography;  Surgeon: [name], MD;  Location: [place] CV LAB;  Service: Cardiovascular;  Laterality: N/A;   PERIPHERAL VASCULAR CATHETERIZATION N/A 06/06/2016   Procedure: Abdominal Aortogram;  Surgeon: [name], MD;  Location: [place] CV LAB;  Service: Cardiovascular;  Laterality: N/A;   RENAL DUPLEX Bilateral 02/02/2013   Bilateral renal arteries demonstrate narrowingconsistent with a 60-99% diameter reduction.   TRANSTHORACIC ECHOCARDIOGRAM  08/02/2013   EF 55-60%, normal echocardiogram    Social history   reports that he has been smoking cigarettes. He has a 14.50 pack-year smoking history. He has never  used smokeless tobacco. He reports that he does not drink alcohol and does not use drugs.  No Known Allergies  Family History  Problem Relation Age of Onset   Heart disease Father    Heart disease Sister    Coronary artery disease Brother        CABG x 6   Heart disease Brother    Coronary artery disease Brother        first MI 86, died at 28 during cardiac surgery   Coronary artery disease Sister        CABG x 5, RAS   Heart disease Sister    Colon cancer Neg Hx      Prior to Admission medications   Medication Sig Start Date End Date Taking? Authorizing Provider  acetaminophen (TYLENOL) 500 MG tablet Take 1,000 mg by mouth daily as needed for moderate pain (for leg pain).  [provider]  aspirin EC 81 MG tablet Take 81 mg by mouth every evening.     [provider]  ciprofloxacin (CIPRO) 500 MG tablet Take 1 tablet (500 mg total) by mouth 2 (two) times daily. 06/06/21   [name], MD  clopidogrel (PLAVIX) 75 MG tablet TAKE 1 TABLET BY MOUTH EVERY DAY Patient taking differently: Take 75 mg by mouth daily. 03/05/21   [name], MD  docusate sodium (COLACE) 100 MG capsule Take 100 mg by mouth daily as needed (for constipation).  08/19/13   Debbe Odea, MD  ferrous gluconate (FERGON) 324 MG tablet TAKE 1 TABLET BY MOUTH EVERY DAY WITH BREAKFAST. Patient taking differently: Take 324 mg by mouth daily with breakfast. TAKE 1 TABLET BY MOUTH EVERY DAY WITH BREAKFAST. 12/15/20   [name], MD  guaiFENesin (MUCINEX) 600 MG 12 hr tablet Take 600 mg by mouth 2 (two) times daily as needed for to loosen phlegm or cough.    [provider]  hydrochlorothiazide (HYDRODIURIL) 25 MG tablet Take 25 mg by mouth daily.    [provider]  isosorbide mononitrate (IMDUR) 30 MG 24 hr tablet TAKE 1 TABLET (30 MG TOTAL) BY MOUTH DAILY. NEED OV. 01/11/21   [name], MD  metoprolol tartrate (LOPRESSOR) 50 MG tablet TAKE 1 TABLET BY MOUTH EVERY  DAY Patient taking differently: Take 50 mg by mouth daily. 03/22/21   [name], MD  NIFEdipine (ADALAT CC) 90 MG 24 hr tablet TAKE 1 TABLET BY MOUTH EVERY DAY Patient taking differently: Take 90 mg by mouth daily. TAKE 1 TABLET BY MOUTH EVERY DAY 12/14/20   [name], MD  nirmatrelvir/ritonavir EUA (PAXLOVID) TABS Take 3 tablets by mouth 2 (two) times daily for 5 days. (Take nirmatrelvir 150 mg two tablets twice daily for 5 days and ritonavir 100 mg one tablet twice daily for 5 days) Patient GFR is 60 06/12/21 06/17/21  [name], MD  simvastatin (ZOCOR) 20 MG tablet TAKE 1 TABLET (20 MG TOTAL) BY MOUTH DAILY AT 6 PM. NEED OV. 03/13/21   [name], MD  vitamin B-12 (CYANOCOBALAMIN) 500 MCG tablet Take 500 mcg by mouth in the morning.    [provider]    Physical Exam: Looks fairly comfortable on 2 L oxygen.  His saturation is stable even after removing the oxygen.  Frail and debilitated but not in any distress.  Hard of hearing. Vitals:   06/08/2021 1345 06/07/2021 1400 06/11/2021 1415 06/21/2021 1430  BP:  (!) 144/61  (!) 135/113  Pulse: 65 64 63 64  Resp: 19 (!) 23 (!) 27 (!) 28  Temp:      TempSrc:      SpO2: 99% 98% 100% 96%    Constitutional: NAD, calm, comfortable Vitals:   06/11/2021 1345 06/21/2021 1400 06/10/2021 1415 06/17/2021 1430  BP:  (!) 144/61  (!) 135/113  Pulse: 65 64 63 64  Resp: 19 (!) 23 (!) 27 (!) 28  Temp:      TempSrc:      SpO2: 99% 98% 100% 96%   Eyes: PERRL, lids and conjunctivae normal ENMT: Mucous membranes are moist. Posterior pharynx clear of any exudate or lesions.Normal dentition.  Neck: normal, supple, no masses, no thyromegaly Respiratory: Mostly clear.  He has diffuse conducted airway sounds and occasional expiratory wheezes. Cardiovascular: Regular rate and rhythm, no murmurs / rubs / gallops. No extremity edema. 2+ pedal pulses. No carotid bruits.  Chronic  ischemic changes.  No acute ischemia. Abdomen: no tenderness, no masses  palpated. No hepatosplenomegaly. Bowel sounds positive.  Musculoskeletal: no clubbing / cyanosis. No joint deformity upper and lower extremities. Good ROM, no contractures. Normal muscle tone.  Skin: no rashes, lesions, ulcers. No induration Neurologic: CN 2-12 grossly intact. Sensation intact, DTR normal. Strength 5/5 in all 4.  Psychiatric: Normal judgment and insight. Alert and oriented x 3. Normal mood.     Labs on Admission: I have personally reviewed following labs and imaging studies  CBC: Recent Labs  Lab 06/04/2021 1300  WBC 14.4*  NEUTROABS 11.3*  HGB 12.6*  HCT 38.7*  MCV 96.0  PLT [id number]   Basic Metabolic Panel: Recent Labs  Lab 06/09/2021 1300  NA 138  K 2.9*  CL 100  CO2 28  GLUCOSE 155*  BUN 32*  CREATININE 1.30*  CALCIUM 9.0   GFR: Estimated Creatinine Clearance: 37.3 mL/min (A) (by C-G formula based on SCr of 1.3 mg/dL (H)). Liver Function Tests: Recent Labs  Lab 06/05/2021 1300  AST 16  ALT 10  ALKPHOS 59  BILITOT 0.6  PROT 7.9  ALBUMIN 3.2*   No results for input(s): LIPASE, AMYLASE in the last 168 hours. No results for input(s): AMMONIA in the last 168 hours. Coagulation Profile: No results for input(s): INR, PROTIME in the last 168 hours. Cardiac Enzymes: No results for input(s): CKTOTAL, CKMB, CKMBINDEX, TROPONINI in the last 168 hours. BNP (last 3 results) No results for input(s): PROBNP in the last 8760 hours. HbA1C: No results for input(s): HGBA1C in the last 72 hours. CBG: No results for input(s): GLUCAP in the last 168 hours. Lipid Profile: No results for input(s): CHOL, HDL, LDLCALC, TRIG, CHOLHDL, LDLDIRECT in the last 72 hours. Thyroid Function Tests: No results for input(s): TSH, T4TOTAL, FREET4, T3FREE, THYROIDAB in the last 72 hours. Anemia Panel: No results for input(s): VITAMINB12, FOLATE, FERRITIN, TIBC, IRON, RETICCTPCT in the last 72 hours. Urine analysis:    Component Value Date/Time   COLORURINE STRAW (A) 04/10/2021  0636   APPEARANCEUR CLEAR 04/10/2021 0636   LABSPEC 1.011 04/10/2021 0636   PHURINE 5.0 04/10/2021 0636   GLUCOSEU NEGATIVE 04/10/2021 0636   HGBUR SMALL (A) 04/10/2021 0636   BILIRUBINUR neg 06/06/2021 0912   KETONESUR NEGATIVE 04/10/2021 0636   PROTEINUR Positive (A) 06/06/2021 0912   PROTEINUR 100 (A) 04/10/2021 0636   UROBILINOGEN 0.2 06/06/2021 0912   NITRITE neg 06/06/2021 0912   NITRITE NEGATIVE 04/10/2021 0636   LEUKOCYTESUR Negative 06/06/2021 0912   LEUKOCYTESUR NEGATIVE 04/10/2021 0636    Radiological Exams on Admission: DG Chest Port 1 View  Result Date: 06/28/2021 CLINICAL DATA:  Not able to walk.  Recent COVID diagnosis EXAM: PORTABLE CHEST 1 VIEW COMPARISON:  04/12/2021 FINDINGS: Prior median sternotomy. Heart size within normal limits. Atherosclerotic aortic arch. Vascular congestion with mild bilateral airspace disease. This could represent pneumonia or mild edema. No effusion. IMPRESSION: Mild bilateral airspace disease with slight progression. Question fluid overload versus pneumonia. Electronically Signed   By: Franchot Gallo M.D.   On: 06/10/2021 15:02    EKG: Independently reviewed.  Sinus rhythm.  No change from previous EKGs.  Assessment/Plan Principal Problem:   Pneumonia due to COVID-19 virus Active Problems:   Hyperlipidemia   Essential hypertension   PVD- Lt SFA PTA/stent 05/23/14   Carotid artery disease ([facility])   PAD (peripheral artery disease) ([place])     1.  Pneumonia due to COVID-19 virus with hypoxemia: Agree with admission to monitored  unit due to significant symptoms  chest physiotherapy, incentive spirometry, deep breathing exercises, sputum induction, mucolytic's and bronchodilators. Supplemental oxygen to keep saturations more than 90%. Covid directed therapy with , steroids, not indicated. Started on antiviral, will continue 5 days therapy of Paxlovid. antibiotics , given elevated white cell count, will start patient on Rocephin and  azithromycin today.  Will check procalcitonin and also follow-up procalcitonin tomorrow morning. Due to severity of symptoms, patient will need daily inflammatory markers, chest x-rays, liver function test to monitor and direct COVID-19 therapies. Work with PT OT in the morning.  2.  Hypokalemia: Significant.  Aggressively replace and recheck.  We will check magnesium and phosphorus with morning labs.  3.  Acute kidney injury: Due to #1.  Mild hydration and recheck level tomorrow morning.  Will hold off on hydrochlorothiazide.  Hold off further IV fluids.  4.  Essential hypertension: Blood pressure is stable.  Resume all medications except hydrochlorothiazide.  5.  Hyperlipidemia: Cannot start statins on discharge.  6.  Peripheral artery disease with history of stroke: Currently stable.  On aspirin and Plavix that he will continue.   DVT prophylaxis: Lovenox subcu Code Status: Full code Family Communication: Patient's son on the phone.  He is also diagnosed with COVID-19. Disposition Plan: Home with home health OT PT Consults called: None Admission status: Inpatient.  Cardiac telemetry.  Patient presents with significant symptoms including COVID-19 pneumonia, hypokalemia.  I anticipate he will stay in the hospital more than 2 midnights.   Barb Merino MD [facility] Pager [phone]

## 2021-06-14 NOTE — ED Provider Notes (Signed)
[facility] DEPT Provider Note   CSN: [id number] Arrival date & time: 06/09/2021  1204     History Chief Complaint  Patient presents with   Covid Positive    Earl Castillo is a 80 y.o. male.  HPI  80 year old male with a history of anemia, angiodysplasia of intestine, cancer, carotid artery disease, CHF, colon polyps, CAD, GERD, nephrolithiasis, hyperlipidemia, hypertension, osteoarthritis, peripheral arterial disease, pneumonia, CVA, who presents to the emergency department today for evaluation of COVID symptoms and generalized weakness.  Patient states that his son was recently diagnosed with COVID and therefore he was tested 5 days ago and tested positive.  He has become increasingly weak over the last week.  He was started on Paxil yesterday but has not had improvement of symptoms.  He typically ambulates with an assist device but was unable to stand on his own today and almost fell to the ground.  He denies any shortness of breath, chest pain, vomiting, diarrhea.  He has had a bit of a cough.  He does not know if he has had any fevers.  Past Medical History:  Diagnosis Date   Anemia    Angiodysplasia of intestine - small bowel  09/21/2013   Blood transfusion abn reaction or complication, no procedure mishap    "HgB dropped real low"   Cancer ([place])    Skin cancers   Carotid artery disease ([place])    status post carotid endarterectomy by [name]   CHF (congestive heart failure) ([place]) 04/09/2021   new diagnosis   Chicken pox    Colon polyps 08/2012   Hyperplastic and adenomatous sessile polyps   Coronary artery disease    CABG'04, Low risk Myoview 2013   GERD (gastroesophageal reflux disease)    Hemorrhoid    History of blood transfusion 08/2013   "HgB dropped real low; related to bleeding from my rectum"   History of kidney stones    1 removed surgical, passed 2   Hyperlipidemia    Hypertension    Iron deficiency anemia secondary to  blood loss (chronic)- small bowel angiodysplasia 08/17/2013   Lower GI bleeding 08/2013   Osteoarthritis    "hips" (05/23/2014)   Peripheral arterial disease ([place])    status post left to right fem-fem crossover graft in by [name]   Pneumonia    - "years ago"   Sinus headache    "alot" (05/23/2014)   Skin exam, screening for cancer    sees [name]    Stroke [facility])    Vitamin B12 deficiency 11/23/2013    Patient Active Problem List   Diagnosis Date Noted   Pneumonia due to COVID-19 virus 06/24/2021   COVID-19 virus infection 06/12/2021   COPD with emphysema ([place]) 06/06/2021   Protein-calorie malnutrition, severe 04/11/2021   Acute CHF (congestive heart failure) ([place]) 04/09/2021   Acute respiratory failure with hypoxia ([place]) 04/09/2021   NAION (non-arteritic anterior ischemic optic neuropathy), left 12/28/2020   Choroidal nevus, right 11/30/2020   PAD (peripheral artery disease) ([place]) 08/09/2016   GI bleed 06/22/2014   Current smoker 05/24/2014   Claudication ([employer]) 05/23/2014   Vitamin B12 deficiency 11/23/2013   Angiodysplasia of intestine - small bowel  09/21/2013   PVD- Lt SFA PTA/stent 05/23/14 09/13/2013   Carotid artery disease ([place]) 09/13/2013   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 08/17/2013   Personal history of colonic polyps-adenoma 08/17/2012   NEPHROLITHIASIS, HX OF 09/10/2010  URTICARIA 08/22/2010   Coronary atherosclerosis 01/13/2009   Osteoarthritis 01/13/2009   CHICKENPOX, HX OF 01/13/2008   Hyperlipidemia 07/29/2007   Essential hypertension 07/29/2007    Past Surgical History:  Procedure Laterality Date   BAND HEMORRHOIDECTOMY     BASAL CELL CARCINOMA EXCISION  May 2009   per [name] , left nose    basket extraction of ureteral stone  1970's   Bypass rt leg Right 2000   capsule endoscopy  10-05-13   per [name], 4 AVM's   CARDIAC CATHETERIZATION  08/18/2003   Recommend CABG    CARDIOVASCULAR STRESS TEST - LEXISCAN  08/25/2012   No significant wall motion abnormalities   CAROTID ANGIOGRAM N/A 03/17/2014   Procedure: CAROTID Cyril Loosen;  Surgeon: [name], MD;  Location: [facility] CATH LAB;  Service: Cardiovascular;  Laterality: N/A;  right carotid   CAROTID ANGIOGRAPHY N/A 04/06/2021   Procedure: CAROTID ANGIOGRAPHY;  Surgeon: Elam Dutch, MD;  Location: [facility] CV LAB;  Service: Cardiovascular;  Laterality: N/A;   CAROTID ENDARTERECTOMY Right    CAROTID ENDARTERECTOMY Right 03/26/2005   Dr Georgina Quint   CATARACT EXTRACTION W/ INTRAOCULAR LENS  IMPLANT, BILATERAL Bilateral 2014   per [name]   CERVICAL DISC SURGERY  1978   "bulging disc"   COLONOSCOPY  08-12-12   per [name], tubular adenomas, repeat in 5 yrs    CORONARY ARTERY BYPASS GRAFT  08/30/2003   x3. LIMA-distal LAD, RIMA-circumflex, SVG-distal RCA   CYSTOSCOPY W/ STONE MANIPULATION     ESOPHAGOGASTRODUODENOSCOPY N/A 08/17/2013   Procedure: ESOPHAGOGASTRODUODENOSCOPY (EGD);  Surgeon: [name], MD;  Location: [facility] ENDOSCOPY;  Service: Endoscopy;  Laterality: N/A;   FEMORAL ARTERY STENT Left 05/23/2014   FEMORAL-FEMORAL BYPASS GRAFT  2003   FEMORAL-POPLITEAL BYPASS GRAFT Left 08/09/2016   Procedure: BYPASS GRAFT LEFT FEMORAL-POPLITEAL ARTERY USING GORE 6MM X 80CM PROPATEN GRAFT;  Surgeon: Serafina Mitchell, MD;  Location: [facility];  Service: Vascular;  Laterality: Left;   LOWER EXTREMITY ANGIOGRAM N/A 03/17/2014   Procedure: LOWER EXTREMITY ANGIOGRAM;  Surgeon: [name], MD;  Location[facility] CATH LAB;  Service: Cardiovascular;  Laterality: N/A;   PERIPHERAL VASCULAR CATHETERIZATION N/A 06/06/2016   Procedure: Lower Extremity Angiography;  Surgeon: [name], MD;  Location: [facility] CV LAB;  Service: Cardiovascular;  Laterality: N/A;   PERIPHERAL VASCULAR CATHETERIZATION N/A 06/06/2016   Procedure: Abdominal Aortogram;  Surgeon: [name], MD;  Location: [facility] CV LAB;   Service: Cardiovascular;  Laterality: N/A;   RENAL DUPLEX Bilateral 02/02/2013   Bilateral renal arteries demonstrate narrowingconsistent with a 60-99% diameter reduction.   TRANSTHORACIC ECHOCARDIOGRAM  08/02/2013   EF 55-60%, normal echocardiogram       Family History  Problem Relation Age of Onset   Heart disease Father    Heart disease Sister    Coronary artery disease Brother        CABG x 6   Heart disease Brother    Coronary artery disease Brother        first MI 24, died at 110 during cardiac surgery   Coronary artery disease Sister        CABG x 5, RAS   Heart disease Sister    Colon cancer Neg Hx     Social History   Tobacco Use   Smoking status: Every Day    Packs/day: 0.25    Years: 58.00    Pack years: 14.50    Types: Cigarettes  Smokeless tobacco: Never   Tobacco comments:    3-4 cigarettes/day  Vaping Use   Vaping Use: Never used  Substance Use Topics   Alcohol use: No    Alcohol/week: 0.0 standard drinks   Drug use: No    Home Medications Prior to Admission medications   Medication Sig Start Date End Date Taking? Authorizing Provider  acetaminophen (TYLENOL) 500 MG tablet Take 1,000 mg by mouth daily as needed for moderate pain (for leg pain).    [provider]  aspirin EC 81 MG tablet Take 81 mg by mouth every evening.     [provider]  ciprofloxacin (CIPRO) 500 MG tablet Take 1 tablet (500 mg total) by mouth 2 (two) times daily. 06/06/21   [name], MD  clopidogrel (PLAVIX) 75 MG tablet TAKE 1 TABLET BY MOUTH EVERY DAY Patient taking differently: Take 75 mg by mouth daily. 03/05/21   [name], MD  docusate sodium (COLACE) 100 MG capsule Take 100 mg by mouth daily as needed (for constipation).  08/19/13   Debbe Odea, MD  ferrous gluconate (FERGON) 324 MG tablet TAKE 1 TABLET BY MOUTH EVERY DAY WITH BREAKFAST. Patient taking differently: Take 324 mg by mouth daily with breakfast. TAKE 1 TABLET BY MOUTH EVERY DAY  WITH BREAKFAST. 12/15/20   [name], MD  guaiFENesin (MUCINEX) 600 MG 12 hr tablet Take 600 mg by mouth 2 (two) times daily as needed for to loosen phlegm or cough.    [provider]  hydrochlorothiazide (HYDRODIURIL) 25 MG tablet Take 25 mg by mouth daily.    [provider]  isosorbide mononitrate (IMDUR) 30 MG 24 hr tablet TAKE 1 TABLET (30 MG TOTAL) BY MOUTH DAILY. NEED OV. 01/11/21   [name], MD  metoprolol tartrate (LOPRESSOR) 50 MG tablet TAKE 1 TABLET BY MOUTH EVERY DAY Patient taking differently: Take 50 mg by mouth daily. 03/22/21   [name], MD  NIFEdipine (ADALAT CC) 90 MG 24 hr tablet TAKE 1 TABLET BY MOUTH EVERY DAY Patient taking differently: Take 90 mg by mouth daily. TAKE 1 TABLET BY MOUTH EVERY DAY 12/14/20   [name], MD  nirmatrelvir/ritonavir EUA (PAXLOVID) TABS Take 3 tablets by mouth 2 (two) times daily for 5 days. (Take nirmatrelvir 150 mg two tablets twice daily for 5 days and ritonavir 100 mg one tablet twice daily for 5 days) Patient GFR is 60 06/12/21 06/17/21  [name], MD  simvastatin (ZOCOR) 20 MG tablet TAKE 1 TABLET (20 MG TOTAL) BY MOUTH DAILY AT 6 PM. NEED OV. 03/13/21   [name], MD  vitamin B-12 (CYANOCOBALAMIN) 500 MCG tablet Take 500 mcg by mouth in the morning.    [provider]    Allergies    Patient has no known allergies.  Review of Systems   Review of Systems  Constitutional:  Negative for fever.  HENT:  Negative for ear pain and sore throat.   Eyes:  Negative for visual disturbance.  Respiratory:  Positive for cough. Negative for shortness of breath.   Cardiovascular:  Negative for chest pain.  Gastrointestinal:  Negative for abdominal pain, constipation, diarrhea, nausea and vomiting.  Genitourinary:  Negative for dysuria and hematuria.  Musculoskeletal:  Negative for back pain.  Skin:  Negative for rash.  Neurological:  Positive for weakness (generalized). Negative for headaches.   All other systems reviewed and are negative.  Physical Exam Updated Vital Signs BP (!) 135/113  Pulse 64   Temp 98.3 F (36.8 C) (Oral)   Resp (!) 28   SpO2 96%   Physical Exam Vitals and nursing note reviewed.  Constitutional:      Appearance: He is well-developed.  HENT:     Head: Normocephalic and atraumatic.     Mouth/Throat:     Mouth: Mucous membranes are dry.  Eyes:     Conjunctiva/sclera: Conjunctivae normal.  Cardiovascular:     Rate and Rhythm: Normal rate and regular rhythm.     Heart sounds: No murmur heard. Pulmonary:     Effort: No respiratory distress.     Breath sounds: Wheezing and rhonchi present.     Comments: tachypneic Abdominal:     General: Bowel sounds are normal.     Palpations: Abdomen is soft.     Tenderness: There is no abdominal tenderness. There is no guarding or rebound.  Musculoskeletal:     Cervical back: Neck supple.  Skin:    General: Skin is warm and dry.  Neurological:     Mental Status: He is alert.    ED Results / Procedures / Treatments   Labs (all labs ordered are listed, but only abnormal results are displayed) Labs Reviewed  CBC WITH DIFFERENTIAL/PLATELET - Abnormal; Notable for the following components:      Result Value   WBC 14.4 (*)    RBC 4.03 (*)    Hemoglobin 12.6 (*)    HCT 38.7 (*)    Neutro Abs 11.3 (*)    Monocytes Absolute 1.3 (*)    All other components within normal limits  COMPREHENSIVE METABOLIC PANEL - Abnormal; Notable for the following components:   Potassium 2.9 (*)    Glucose, Bld 155 (*)    BUN 32 (*)    Creatinine, Ser 1.30 (*)    Albumin 3.2 (*)    GFR, Estimated 56 (*)    All other components within normal limits  D-DIMER, QUANTITATIVE - Abnormal; Notable for the following components:   D-Dimer, Quant 1.72 (*)    All other components within normal limits  TROPONIN I (HIGH SENSITIVITY) - Abnormal; Notable for the following components:   Troponin I (High Sensitivity) 27 (*)    All  other components within normal limits  RESP PANEL BY RT-PCR (FLU A&B, COVID) [id number]  URINALYSIS, ROUTINE W REFLEX MICROSCOPIC  TROPONIN I (HIGH SENSITIVITY)    EKG EKG Interpretation  Date/Time:  Thursday June 14 2021 13:11:44 EDT Ventricular Rate:  66 PR Interval:  130 QRS Duration: 105 QT Interval:  444 QTC Calculation: 466 R Axis:   5 Text Interpretation: Sinus rhythm Wavy baseline, no ST elevation or depression Confirmed by [place] ([id number]) on 06/08/2021 2:01:26 PM  Radiology DG Chest Port 1 View  Result Date: 06/13/2021 CLINICAL DATA:  Not able to walk.  Recent COVID diagnosis EXAM: PORTABLE CHEST 1 VIEW COMPARISON:  04/12/2021 FINDINGS: Prior median sternotomy. Heart size within normal limits. Atherosclerotic aortic arch. Vascular congestion with mild bilateral airspace disease. This could represent pneumonia or mild edema. No effusion. IMPRESSION: Mild bilateral airspace disease with slight progression. Question fluid overload versus pneumonia. Electronically Signed   By: Franchot Gallo M.D.   On: 06/06/2021 15:02    Procedures Procedures   CRITICAL CARE Performed by: Rodney Booze   Total critical care time: 32 minutes  Critical care time was exclusive of separately billable procedures and treating other patients.  Critical care was necessary to treat or prevent imminent or life-threatening deterioration.  Critical care was time spent personally by me on the following activities: development of treatment plan with patient and/or surrogate as well as nursing, discussions with consultants, evaluation of patient's response to treatment, examination of patient, obtaining history from patient or surrogate, ordering and performing treatments and interventions, ordering and review of laboratory studies, ordering and review of radiographic studies, pulse oximetry and re-evaluation of patient's condition.   Medications Ordered in ED Medications  sodium chloride 0.9 %  bolus 500 mL (500 mLs Intravenous New Bag/Given 06/16/2021 1312)    ED Course  I have reviewed the triage vital signs and the nursing notes.  Pertinent labs & imaging results that were available during my care of the patient were reviewed by me and considered in my medical decision making (see chart for details).  Clinical Course as of 07/01/2021 1508  Thu Jun 14, 2021  1315 COVID (+) weakness, new O2 requirement, coming in [MK]    Clinical Course User Index [MK] Kommor, Debe Coder, MD   MDM Rules/Calculators/A&P                          80 year old male presenting to the emergency department today for evaluation of generalized weakness.  Diagnosed with COVID earlier this week.  During my evaluation patient's sats dropped to 88% with conversation.  He is placed on 2 L and his hypoxia and tachypnea improved.  Reviewed/intepreted labs CBC with mild leukocytosis, mild anemia CMP with hypokalemia and mild aki with elevated bun/cr  - po k given  - small bolus ivf given Ddimer marginally elevated - likely due to current covid infection, lower suspicion for PE at this time Trop is marginally elevated which is likely due to demand UA pending on admission  EKG with nsr, no st elevations or depressions  Reviewed/interpreted imaging CXR - Mild bilateral airspace disease with slight progression. Question fluid overload versus pneumonia  - more likely pneumonia, no clinical signs of fluid overload  3:06 PM CONSULT with Dr. Sloan Leiter who accepts patient for admission   Final Clinical Impression(s) / ED Diagnoses Final diagnoses:  Acute respiratory failure with hypoxia ([place])  Hypokalemia  AKI (acute kidney injury) [facility])    Rx / DC Orders ED Discharge Orders     None        Bishop Dublin 06/15/2021 1508    Teressa Lower, MD 06/15/2021 1804

## 2021-06-14 NOTE — ED Triage Notes (Signed)
Pt presents via [facility] after c/o not being able to walk. Pt had a stroke in April and has had issues with mobility in his legs but was dx with Covid on Sunday which has made it worse. Pt has 'cognitive impairment' per EMS. Lives with son. Alert and oriented. Pt started taking antibiotics he had at home bc he thinks he has a UTI.

## 2021-06-15 LAB — PROCALCITONIN: Procalcitonin: <0.1 ng/mL

## 2021-06-15 LAB — COMPREHENSIVE METABOLIC PANEL
ALT: 9 U/L (ref 0–44)
AST: 16 U/L (ref 15–41)
Albumin: 2.9 g/dL — ABNORMAL LOW (ref 3.5–5.0)
Alkaline Phosphatase: 51 U/L (ref 38–126)
Anion gap: 12 (ref 5–15)
BUN: 43 mg/dL — ABNORMAL HIGH (ref 8–23)
CO2: 24 mmol/L (ref 22–32)
Calcium: 8.6 mg/dL — ABNORMAL LOW (ref 8.9–10.3)
Chloride: 100 mmol/L (ref 98–111)
Creatinine, Ser: 1.85 mg/dL — ABNORMAL HIGH (ref 0.61–1.24)
GFR, Estimated: 37 mL/min — ABNORMAL LOW (ref >60)
Glucose, Bld: 140 mg/dL — ABNORMAL HIGH (ref 70–99)
Potassium: 3.6 mmol/L (ref 3.5–5.1)
Sodium: 136 mmol/L (ref 135–145)
Total Bilirubin: 0.5 mg/dL (ref 0.3–1.2)
Total Protein: 7 g/dL (ref 6.5–8.1)

## 2021-06-15 LAB — CBC WITH DIFFERENTIAL/PLATELET
Abs Immature Granulocytes: 0.11 10*3/uL — ABNORMAL HIGH (ref 0.00–0.07)
Basophils Absolute: 0.1 10*3/uL (ref 0.0–0.1)
Basophils Relative: 0 %
Eosinophils Absolute: 0 10*3/uL (ref 0.0–0.5)
Eosinophils Relative: 0 %
HCT: 34.3 % — ABNORMAL LOW (ref 39.0–52.0)
Hemoglobin: 11.2 g/dL — ABNORMAL LOW (ref 13.0–17.0)
Immature Granulocytes: 1 %
Lymphocytes Relative: 14 %
Lymphs Abs: 1.8 10*3/uL (ref 0.7–4.0)
MCH: 31.1 pg (ref 26.0–34.0)
MCHC: 32.7 g/dL (ref 30.0–36.0)
MCV: 95.3 fL (ref 80.0–100.0)
Monocytes Absolute: 1.2 10*3/uL — ABNORMAL HIGH (ref 0.1–1.0)
Monocytes Relative: 9 %
Neutro Abs: 9.6 10*3/uL — ABNORMAL HIGH (ref 1.7–7.7)
Neutrophils Relative %: 76 %
Platelets: 304 10*3/uL (ref 150–400)
RBC: 3.6 MIL/uL — ABNORMAL LOW (ref 4.22–5.81)
RDW: 13.2 % (ref 11.5–15.5)
WBC: 12.7 10*3/uL — ABNORMAL HIGH (ref 4.0–10.5)
nRBC: 0 % (ref 0.0–0.2)

## 2021-06-15 LAB — D-DIMER, QUANTITATIVE: D-Dimer, Quant: 1.22 ug/mL-FEU — ABNORMAL HIGH (ref 0.00–0.50)

## 2021-06-15 LAB — LEGIONELLA PNEUMOPHILA SEROGP 1 UR AG: L. pneumophila Serogp 1 Ur Ag: NEGATIVE

## 2021-06-15 LAB — FERRITIN: Ferritin: 93 ng/mL (ref 24–336)

## 2021-06-15 LAB — C-REACTIVE PROTEIN: CRP: 12.2 mg/dL — ABNORMAL HIGH (ref <1.0)

## 2021-06-15 LAB — PHOSPHORUS: Phosphorus: 4.7 mg/dL — ABNORMAL HIGH (ref 2.5–4.6)

## 2021-06-15 LAB — MAGNESIUM: Magnesium: 2.2 mg/dL (ref 1.7–2.4)

## 2021-06-15 MED ORDER — SODIUM CHLORIDE 0.9 % IV SOLN: INTRAVENOUS | Status: DC

## 2021-06-15 MED ORDER — ENOXAPARIN SODIUM 30 MG/0.3ML IJ SOSY
30.0000 mg | PREFILLED_SYRINGE | INTRAMUSCULAR | Status: DC
  Administered 2021-06-15: 30 mg via SUBCUTANEOUS
  Filled 2021-06-15: qty 0.3

## 2021-06-15 MED ORDER — ALUM & MAG HYDROXIDE-SIMETH 200-200-20 MG/5ML PO SUSP
30.0000 mL | ORAL | Status: DC | PRN
  Administered 2021-06-15: 30 mL via ORAL
  Filled 2021-06-15 (×2): qty 30

## 2021-06-15 NOTE — Progress Notes (Signed)
PROGRESS NOTE    Earl Castillo  [id number] DOB: May 11, 1941 DOA: 06/05/2021 PCP: [name], MD    Brief Narrative:  80 y.o. male with medical history significant of extensive medical issues including chronic anemia due to iron deficiency, diastolic heart failure, hypertension, hyperlipidemia, severe PAD and ambulatory dysfunction, GERD , B12 deficiency presents to hospital with extreme weakness and cough and congestion.  Diagnosed with COVID-19 at home on 8/9.   Primary care physician Dr. Sarajane Jews called him Paxlovid and taken first dose the night before the hospitalization.  He could not get out of the couch so brought to ER.  In the emergency room, Blood pressure stable.  Briefly on 2 L of oxygen.  WC count 14.4.  Potassium 2.9.  Creatinine 1.3 which is more than baseline of 1.  Chest x-ray with minimal bilateral airspace disease, no evidence of consolidation.  COVID-19 test positive.  Patient was given potassium supplement, 500 mL of IV fluids and admission requested due to significant symptoms.   Assessment & Plan:   Principal Problem:   Pneumonia due to COVID-19 virus Active Problems:   Hyperlipidemia   Essential hypertension   PVD- Lt SFA PTA/stent 05/23/14   Carotid artery disease (HCC)   PAD (peripheral artery disease) ([place])  1 Suspected pneumonia due to COVID-19 virus with hypoxemia: He has significant symptoms so he was admitted to the hospital.  Transiently on oxygen.  X-ray with no evidence of pneumonia.  Already improving. Continue to monitor due to significant symptoms on presentation. chest physiotherapy, incentive spirometry, deep breathing exercises, sputum induction, mucolytic's and bronchodilators. Supplemental oxygen to keep saturations more than 90%.  Mobilize. Covid directed therapy with , steroids, not indicated Paxlovid continued from outpatient day 2/5. antibiotics, initially started on Rocephin and azithromycin with suspected superadded bacterial  infection.  However with rapid improvement and normal procalcitonin, less likely bacterial infection.  Will discontinue antibiotics.  2.  Hypokalemia: Significant.  Aggressively replaced and improved.  Magnesium phosphorus normal.  Recheck tomorrow morning.   3.  Acute kidney injury: Due to #1.  Mild hydration and recheck level tomorrow morning.  Will hold off on hydrochlorothiazide.  Clinically dehydrated.  Will provide gentle hydration overnight.  Recheck tomorrow morning.   4.  Essential hypertension: Blood pressure is stable.  Resume all medications except hydrochlorothiazide.   5.  Hyperlipidemia: Can start statins on discharge.   6.  Peripheral artery disease with history of stroke: Currently stable.  On aspirin and Plavix that he will continue.   DVT prophylaxis: enoxaparin (LOVENOX) injection 30 mg Start: 06/15/21 2200   Code Status: full code  Family Communication: son on the phone  Disposition Plan: Status is: Inpatient  Remains inpatient appropriate because:Inpatient level of care appropriate due to severity of illness  Dispo: The patient is from: Home              Anticipated d/c is to: Home              Patient currently is medically stable to d/c.   Difficult to place patient No         Consultants:  None   Procedures:  None   Antimicrobials:  Paxlovid 8/10 --- --    Subjective: Patient seen and examined.  No overnight events.  Hard of hearing but denies any issues.  Afebrile overnight.  Currently on room air.  Objective: Vitals:   06/15/21 0026 06/15/21 0522 06/15/21 0832 06/15/21 0958  BP: (!) 127/56 134/80  (!) 138/52  Pulse: 70 72  78  Resp: (!) 22 (!) 22  18  Temp: 99.1 F (37.3 C) 99.1 F (37.3 C)  98.9 F (37.2 C)  TempSrc: Oral Oral  Oral  SpO2: 92% 95% 94% 95%  Weight:      Height:        Intake/Output Summary (Last 24 hours) at 06/15/2021 1319 Last data filed at 06/15/2021 0400 Gross per 24 hour  Intake 103 ml  Output --  Net  103 ml   Filed Weights   07/03/2021 2100  Weight: 58.7 kg    Examination:  General exam: Appears calm and comfortable  On room air.  Frail debilitated and chronically sick looking but not in any distress. Respiratory system: Mostly clear.  Some conducted airway sounds. Cardiovascular system: S1 & S2 heard, RRR. No JVD, murmurs, rubs, gallops or clicks. No pedal edema. Gastrointestinal system: Abdomen is nondistended, soft and nontender. No organomegaly or masses felt. Normal bowel sounds heard. Central nervous system: Alert and oriented x2-3.  Hard of hearing.  Generalized weakness.  Data Reviewed: I have personally reviewed following labs and imaging studies  CBC: Recent Labs  Lab 06/27/2021 1300 06/15/21 0415  WBC 14.4* 12.7*  NEUTROABS 11.3* 9.6*  HGB 12.6* 11.2*  HCT 38.7* 34.3*  MCV 96.0 95.3  PLT 337 [id number]   Basic Metabolic Panel: Recent Labs  Lab 06/17/2021 1300 06/15/21 0415  NA 138 136  K 2.9* 3.6  CL 100 100  CO2 28 24  GLUCOSE 155* 140*  BUN 32* 43*  CREATININE 1.30* 1.85*  CALCIUM 9.0 8.6*  MG  --  2.2  PHOS  --  4.7*   GFR: Estimated Creatinine Clearance: 26.9 mL/min (A) (by C-G formula based on SCr of 1.85 mg/dL (H)). Liver Function Tests: Recent Labs  Lab 07/01/2021 1300 06/15/21 0415  AST 16 16  ALT 10 9  ALKPHOS 59 51  BILITOT 0.6 0.5  PROT 7.9 7.0  ALBUMIN 3.2* 2.9*   No results for input(s): LIPASE, AMYLASE in the last 168 hours. No results for input(s): AMMONIA in the last 168 hours. Coagulation Profile: No results for input(s): INR, PROTIME in the last 168 hours. Cardiac Enzymes: No results for input(s): CKTOTAL, CKMB, CKMBINDEX, TROPONINI in the last 168 hours. BNP (last 3 results) No results for input(s): PROBNP in the last 8760 hours. HbA1C: No results for input(s): HGBA1C in the last 72 hours. CBG: No results for input(s): GLUCAP in the last 168 hours. Lipid Profile: No results for input(s): CHOL, HDL, LDLCALC, TRIG, CHOLHDL,  LDLDIRECT in the last 72 hours. Thyroid Function Tests: No results for input(s): TSH, T4TOTAL, FREET4, T3FREE, THYROIDAB in the last 72 hours. Anemia Panel: Recent Labs    06/15/21 0415  FERRITIN 93   Sepsis Labs: Recent Labs  Lab 06/12/2021 1608 06/15/21 0415  PROCALCITON <0.10 <0.10    Recent Results (from the past 240 hour(s))  Urine Culture     Status: None   Collection Time: 06/06/21  9:33 AM   Specimen: Urine  Result Value Ref Range Status   MICRO NUMBER: [id number]  Final   SPECIMEN QUALITY: Adequate  Final   Sample Source URINE  Final   STATUS: FINAL  Final   Result: No Growth  Final  Resp Panel by RT-PCR (Flu A&B, Covid) Nasopharyngeal Swab     Status: Abnormal   Collection Time: 06/28/2021  1:04 PM   Specimen: Nasopharyngeal Swab; Nasopharyngeal(NP) swabs in vial transport medium  Result Value Ref Range Status  SARS Coronavirus 2 by RT PCR POSITIVE (A) NEGATIVE Final    Comment: RESULT CALLED TO, READ BACK BY AND VERIFIED WITH: A.BANNO, RN AT 1519 ON 08.11.22 BY N.THOMPSON (NOTE) SARS-CoV-2 target nucleic acids are DETECTED.  The SARS-CoV-2 RNA is generally detectable in upper respiratory specimens during the acute phase of infection. Positive results are indicative of the presence of the identified virus, but do not rule out bacterial infection or co-infection with other pathogens not detected by the test. Clinical correlation with patient history and other diagnostic information is necessary to determine patient infection status. The expected result is Negative.  Fact Sheet for Patients: EntrepreneurPulse.com.au  Fact Sheet for Healthcare Providers: IncredibleEmployment.be  This test is not yet approved or cleared by the [place] FDA and  has been authorized for detection and/or diagnosis of SARS-CoV-2 by FDA under an Emergency Use Authorization (EUA).  This EUA will remain in effect (meaning this  test can be used)  for the duration of  the COVID-19 declaration under Section 564(b)(1) of the Act, 21 U.S.C. section 360bbb-3(b)(1), unless the authorization is terminated or revoked sooner.     Influenza A by PCR NEGATIVE NEGATIVE Final   Influenza B by PCR NEGATIVE NEGATIVE Final    Comment: (NOTE) The Xpert Xpress SARS-CoV-2/FLU/RSV plus assay is intended as an aid in the diagnosis of influenza from Nasopharyngeal swab specimens and should not be used as a sole basis for treatment. Nasal washings and aspirates are unacceptable for Xpert Xpress SARS-CoV-2/FLU/RSV testing.  Fact Sheet for Patients: EntrepreneurPulse.com.au  Fact Sheet for Healthcare Providers: IncredibleEmployment.be  This test is not yet approved or cleared by the [place] FDA and has been authorized for detection and/or diagnosis of SARS-CoV-2 by FDA under an Emergency Use Authorization (EUA). This EUA will remain in effect (meaning this test can be used) for the duration of the COVID-19 declaration under Section 564(b)(1) of the Act, 21 U.S.C. section 360bbb-3(b)(1), unless the authorization is terminated or revoked.  Performed at [facility], [place] [address] [place], [place] [address]          Radiology Studies: [facility] Chest Port 1 View  Result Date: 06/11/2021 CLINICAL DATA:  Not able to walk.  Recent COVID diagnosis EXAM: PORTABLE CHEST 1 VIEW COMPARISON:  04/12/2021 FINDINGS: Prior median sternotomy. Heart size within normal limits. Atherosclerotic aortic arch. Vascular congestion with mild bilateral airspace disease. This could represent pneumonia or mild edema. No effusion. IMPRESSION: Mild bilateral airspace disease with slight progression. Question fluid overload versus pneumonia. Electronically Signed   By: Franchot Gallo M.D.   On: 06/07/2021 15:02        Scheduled Meds:  aspirin EC  81 mg Oral QPM   clopidogrel  75 mg Oral Daily   enoxaparin  (LOVENOX) injection  30 mg Subcutaneous Q24H   ferrous gluconate  324 mg Oral Q breakfast   Ipratropium-Albuterol  1 puff Inhalation Q6H   isosorbide mononitrate  30 mg Oral Daily   metoprolol tartrate  50 mg Oral Daily   NIFEdipine  90 mg Oral Daily   nirmatrelvir/ritonavir EUA (renal dosing)  2 tablet Oral BID   potassium chloride  40 mEq Oral BID   sodium chloride flush  3 mL Intravenous Q12H   vitamin B-12  500 mcg Oral Daily   Continuous Infusions:  sodium chloride     sodium chloride       LOS: 1 day    Time spent: 34 minutes    Barb Merino,  MD [facility] Pager [phone]

## 2021-06-15 NOTE — Evaluation (Signed)
Occupational Therapy Evaluation Patient Details Name: Earl Castillo MRN: [id number] DOB: 03-30-1941 Today's Date: 06/15/2021    History of Present Illness 80 yo male presents for evaluation of COVID symptoms and generalized weakness. Pt is covid19 positive. PMH: anemia, angiodysplasia of intestine, cancer, carotid artery disease, CHF, colon polyps, CAD, GERD, nephrolithiasis, hyperlipidemia, hypertension, osteoarthritis, peripheral arterial disease, pneumonia, CVA   Clinical Impression   Patient is a 80 year old male who was admitted for above. Patient was living at home with son supervision for ADLs. Patient currently required max A for bed mobility and to sit on edge of bed with strong lateral lean to R side. Patient is unable to tolerate sitting on edge of bed for long with onset of dizziness.nurse made aware.patient was noted to have decreased sitting balance, decreased functional activity tolerance, decreased endurance and increased dizziness with movement impacting participation in ADLs.  Patient would continue to benefit from skilled OT services at this time while admitted and after d/c to address noted deficits in order to improve overall safety and independence in ADLs.      Follow Up Recommendations  SNF    Equipment Recommendations  None recommended by OT    Recommendations for Other Services       Precautions / Restrictions Precautions Precautions: Fall Precaution Comments: monitor O2 Restrictions Weight Bearing Restrictions: No      Mobility Bed Mobility Overal bed mobility: Needs Assistance Bed Mobility: Supine to Sit;Sit to Supine     Supine to sit: Max assist Sit to supine: Min assist   General bed mobility comments: max A to upright trunk due to strong R lateral lean, difficulty finding upright sitting while EOB requiring min guard-max A to prevent LOB to R, min A to lift BLE back into bed    Transfers                 General transfer comment:  unable, "I feel like I'm going to pass out" once seated EOB so assisted back to supine    Balance Overall balance assessment: Needs assistance Sitting-balance support: Feet supported Sitting balance-Leahy Scale: Poor Sitting balance - Comments: min-max A for sitting EOB due to strong R lateral lean Postural control: Right lateral lean                                 ADL either performed or assessed with clinical judgement   ADL Overall ADL's : Needs assistance/impaired     Grooming: Wash/dry face;Wash/dry hands;Oral care;Bed level;Minimal assistance   Upper Body Bathing: Bed level;Moderate assistance   Lower Body Bathing: Bed level;Maximal assistance   Upper Body Dressing : Moderate assistance;Bed level   Lower Body Dressing: Maximal assistance;Bed level   Toilet Transfer: +2 for physical assistance;Total assistance;+2 for safety/equipment Toilet Transfer Details (indicate cue type and reason): patient was unable to maintain sitting balance on edge of bed without max A for lateral lean to R side. patient had breif moments of holding sitting balance with transition back into leaning to R side. Toileting- Clothing Manipulation and Hygiene: Bed level;Maximal assistance         General ADL Comments: patient attempted supine to sit on edge of bed with reports of needing to go to bathroom. patient required max A for supine to sit on edge of bed to get trunk into upright posture. patient required continued max A for lateral lean to R while sititng with patient indicating  dizziness and visible muscle fatigue in RUE. patient returned to supine with BP taken of 141/59 mmhg. patient attempted sitting up on edge of bed again with same assistance as earlier with reports of dizzienss. patient was noted to be soiled with TD for hygiene on this date and mod A to roll to each side in bed to remove soiled pad.     Vision Patient Visual Report: No change from baseline        Perception     Praxis      Pertinent Vitals/Pain Pain Assessment: No/denies pain     Hand Dominance Right   Extremity/Trunk Assessment Upper Extremity Assessment Upper Extremity Assessment: Generalized weakness   Lower Extremity Assessment Lower Extremity Assessment: Defer to PT evaluation   Cervical / Trunk Assessment Cervical / Trunk Assessment: Normal   Communication Communication Communication: HOH   Cognition Arousal/Alertness: Awake/alert Behavior During Therapy: WFL for tasks assessed/performed Overall Cognitive Status: No family/caregiver present to determine baseline cognitive functioning                                 General Comments: Pt states name and DOB approriately, follows single step commands with increased time, difficulty verbalizing complaints while seated EOB   General Comments  on RA with SpO2 89-93%, BP 141/59 and pulse 70    Exercises     Shoulder Instructions      Home Living Family/patient expects to be discharged to:: Private residence Living Arrangements: Children Available Help at Discharge: Family;Available 24 hours/day Type of Home: House Home Access: Stairs to enter [employer] of Steps: 1 Entrance Stairs-Rails: None Home Layout: One level     Bathroom Shower/Tub: Occupational psychologist: Standard Bathroom Accessibility: Yes   Home Equipment: Cane - single point;Walker - 2 wheels;Shower seat   Additional Comments: lives with son and other son is next door. Pt's son has to work full time. "There's usually someone there with me".      Prior Functioning/Environment Level of Independence: Independent with assistive device(s);Needs assistance  Gait / Transfers Assistance Needed: Pt reports using RW to ambulate household distances. Pt denies falls. ADL's / Homemaking Assistance Needed: Pt reports son assists with bathing, dressing and toileting as needed. Son completes household chores.    Comments: "Someone is with me 99% of the time"        OT Problem List: Decreased strength;Impaired balance (sitting and/or standing);Decreased activity tolerance;Decreased coordination;Decreased knowledge of use of DME or AE;Impaired UE functional use;Decreased safety awareness      OT Treatment/Interventions: DME and/or AE instruction;Self-care/ADL training;Therapeutic activities;Balance training;Therapeutic exercise;Energy conservation;Patient/family education    OT Goals(Current goals can be found in the care plan section) Acute Rehab OT Goals Patient Stated Goal: home with sons to assist OT Goal Formulation: With patient Time For Goal Achievement: 06/29/21 Potential to Achieve Goals: Good  OT Frequency: Min 2X/week   Barriers to D/C:    patient lives at home with SUP from sons       Co-evaluation PT/OT/SLP Co-Evaluation/Treatment: Yes Reason for Co-Treatment: To address functional/ADL transfers PT goals addressed during session: Mobility/safety with mobility OT goals addressed during session: ADL's and self-care      AM-PAC OT "6 Clicks" Daily Activity     Outcome Measure Help from another person eating meals?: A Little Help from another person taking care of personal grooming?: A Little Help from another person toileting, which includes using toliet, bedpan,  or urinal?: Total Help from another person bathing (including washing, rinsing, drying)?: Total Help from another person to put on and taking off regular upper body clothing?: A Lot Help from another person to put on and taking off regular lower body clothing?: Total 6 Click Score: 11   End of Session Nurse Communication: Other (comment) (nurse cleared patient to participate)  Activity Tolerance: Patient limited by fatigue Patient left: in bed;with call bell/phone within reach  OT Visit Diagnosis: Muscle weakness (generalized) (M62.81);Unsteadiness on feet (R26.81)                Time: [id number] OT Time  Calculation (min): 21 min Charges:  OT General Charges $OT Visit: 1 Visit OT Evaluation $OT Eval Moderate Complexity: 1 Mod  Jackelyn Poling OTR/L, MS Acute Rehabilitation Department Office# [phone] Pager# [phone]   [place] 06/15/2021, 2:07 PM

## 2021-06-15 NOTE — Telephone Encounter (Signed)
FYI

## 2021-06-15 NOTE — Progress Notes (Signed)
PHARMACY NOTE:   RENAL DOSAGE ADJUSTMENT   Lovenox 40 mg>> 30 mg for CrCl < 30 ml/min   Eudelia Bunch, Pharm.D 06/15/2021 8:12 AM

## 2021-06-15 NOTE — Evaluation (Signed)
Physical Therapy Evaluation Patient Details Name: Earl Castillo MRN: [id number] DOB: 1941/03/21 Today's Date: 06/15/2021   History of Present Illness  80 yo male presents for evaluation of COVID symptoms and generalized weakness. Pt is covid19 positive. PMH: anemia, angiodysplasia of intestine, cancer, carotid artery disease, CHF, colon polyps, CAD, GERD, nephrolithiasis, hyperlipidemia, hypertension, osteoarthritis, peripheral arterial disease, pneumonia, CVA   Clinical Impression  Pt admitted with above diagnosis. Pt reports son assist minimally as needed with self care tasks, pt ambulates with RW around the home and pt denies falls. Pt currently with significant R lateral trunk lean when seated EOB, difficulty finding normal postural alignment in sitting requiring max-min A varying heavily. Pt reports "I feel like I'm going to pass out" but unable to elaborate; able to continue conversation, equal grip strength, equal smile, moving all extremities equally- RN notified. Pt unable to tolerate transfer to [facility] due to faint feeling and poor trunk control while seated EOB, all vitals WNL. Recommending SNF prior to return home with sons to assist due to deconditioned state and significantly below baseline. Pt currently with functional limitations due to the deficits listed below (see PT Problem List). Pt will benefit from skilled PT to increase their independence and safety with mobility to allow discharge to the venue listed below.       Follow Up Recommendations SNF    Equipment Recommendations  None recommended by PT    Recommendations for Other Services       Precautions / Restrictions Precautions Precautions: Fall Precaution Comments: monitor O2 Restrictions Weight Bearing Restrictions: No      Mobility  Bed Mobility Overal bed mobility: Needs Assistance Bed Mobility: Supine to Sit;Sit to Supine  Supine to sit: Max assist Sit to supine: Min assist   General bed mobility  comments: max A to upright trunk due to strong R lateral lean, difficulty finding upright sitting while EOB requiring min guard-max A to prevent LOB to R, min A to lift BLE back into bed    Transfers  General transfer comment: unable, "I feel like I'm going to pass out" once seated EOB so assisted back to supine  Ambulation/Gait       Stairs            Wheelchair Mobility    Modified Rankin (Stroke Patients Only)       Balance Overall balance assessment: Needs assistance Sitting-balance support: Feet supported Sitting balance-Leahy Scale: Poor Sitting balance - Comments: min-max A for sitting EOB due to strong R lateral lean Postural control: Right lateral lean       Pertinent Vitals/Pain Pain Assessment: No/denies pain    Home Living Family/patient expects to be discharged to:: Private residence Living Arrangements: Children Available Help at Discharge: Family;Available 24 hours/day Type of Home: House Home Access: Stairs to enter Entrance Stairs-Rails: None Entrance Stairs-Number of Steps: 1 Home Layout: One level Home Equipment: Cane - single point;Walker - 2 wheels;Shower seat Additional Comments: lives with son and other son is next door. Pt's son has to work full time. "There's usually someone there with me".    Prior Function Level of Independence: Independent with assistive device(s);Needs assistance   Gait / Transfers Assistance Needed: Pt reports using RW to ambulate household distances. Pt denies falls.  ADL's / Homemaking Assistance Needed: Pt reports son assists with bathing, dressing and toileting as needed. Son completes household chores.  Comments: "Someone is with me 99% of the time"     Hand Dominance   Dominant Hand:  Right    Extremity/Trunk Assessment   Upper Extremity Assessment Upper Extremity Assessment: Generalized weakness    Lower Extremity Assessment Lower Extremity Assessment: Defer to PT evaluation    Cervical / Trunk  Assessment Cervical / Trunk Assessment: Normal  Communication   Communication: HOH  Cognition Arousal/Alertness: Awake/alert Behavior During Therapy: WFL for tasks assessed/performed Overall Cognitive Status: No family/caregiver present to determine baseline cognitive functioning  General Comments: Pt states name and DOB approriately, follows single step commands with increased time, difficulty verbalizing complaints while seated EOB      General Comments General comments (skin integrity, edema, etc.): on RA with SpO2 89-93%, BP 141/59 and pulse 70    Exercises     Assessment/Plan    PT Assessment Patient needs continued PT services  PT Problem List Decreased strength;Decreased activity tolerance;Decreased balance;Decreased mobility;Decreased cognition;Decreased knowledge of use of DME;Cardiopulmonary status limiting activity       PT Treatment Interventions DME instruction;Gait training;Functional mobility training;Therapeutic activities;Therapeutic exercise;Balance training;Patient/family education    PT Goals (Current goals can be found in the Care Plan section)  Acute Rehab PT Goals Patient Stated Goal: home with sons to assist PT Goal Formulation: With patient Time For Goal Achievement: 06/29/21 Potential to Achieve Goals: Good    Frequency Min 3X/week   Barriers to discharge        Co-evaluation PT/OT/SLP Co-Evaluation/Treatment: Yes Reason for Co-Treatment: To address functional/ADL transfers PT goals addressed during session: Mobility/safety with mobility OT goals addressed during session: ADL's and self-care       AM-PAC PT "6 Clicks" Mobility  Outcome Measure Help needed turning from your back to your side while in a flat bed without using bedrails?: A Lot Help needed moving from lying on your back to sitting on the side of a flat bed without using bedrails?: A Lot Help needed moving to and from a bed to a chair (including a wheelchair)?: Total Help needed  standing up from a chair using your arms (e.g., wheelchair or bedside chair)?: Total Help needed to walk in hospital room?: Total Help needed climbing 3-5 steps with a railing? : Total 6 Click Score: 8    End of Session   Activity Tolerance: Patient tolerated treatment well;Patient limited by fatigue Patient left: in bed;with call bell/phone within reach;with bed alarm set Nurse Communication: Mobility status;Other (comment) (poor trunk control in sitting/R lateral lean) PT Visit Diagnosis: Unsteadiness on feet (R26.81);Other abnormalities of gait and mobility (R26.89);Muscle weakness (generalized) (M62.81)    Time: [id number] PT Time Calculation (min) (ACUTE ONLY): 21 min   Charges:   PT Evaluation $PT Eval Moderate Complexity: 1 Mod           Tori Akiel Fennell PT, DPT 06/15/21, 2:00 PM

## 2021-06-15 NOTE — Telephone Encounter (Signed)
PT son called to advise that the PT has been admitted to the hospital yesterday and is not sure of how long he will be in the hospital. PT son advise that currently the plan was to continue the UTI treatment and taking the meds then followup with a test but now he is unsure how long the PT will be admitted. PT son just wanted to notify us of what was going on.

## 2021-06-16 ENCOUNTER — Inpatient Hospital Stay (HOSPITAL_COMMUNITY): Payer: Medicare Other | Admitting: Certified Registered Nurse Anesthetist

## 2021-06-16 ENCOUNTER — Inpatient Hospital Stay (HOSPITAL_COMMUNITY): Payer: Medicare Other

## 2021-06-16 ENCOUNTER — Other Ambulatory Visit: Payer: Self-pay

## 2021-06-16 DIAGNOSIS — J9601 Acute respiratory failure with hypoxia: Secondary | ICD-10-CM

## 2021-06-16 DIAGNOSIS — N179 Acute kidney failure, unspecified: Secondary | ICD-10-CM

## 2021-06-16 DIAGNOSIS — U071 COVID-19: Secondary | ICD-10-CM | POA: Diagnosis not present

## 2021-06-16 DIAGNOSIS — Z2821 Immunization not carried out because of patient refusal: Secondary | ICD-10-CM | POA: Diagnosis present

## 2021-06-16 DIAGNOSIS — I469 Cardiac arrest, cause unspecified: Secondary | ICD-10-CM | POA: Diagnosis not present

## 2021-06-16 DIAGNOSIS — J1282 Pneumonia due to coronavirus disease 2019: Secondary | ICD-10-CM

## 2021-06-16 LAB — BLOOD GAS, ARTERIAL
Acid-base deficit: 13.4 mmol/L — ABNORMAL HIGH (ref 0.0–2.0)
Acid-base deficit: 5.5 mmol/L — ABNORMAL HIGH (ref 0.0–2.0)
Bicarbonate: 18.5 mmol/L — ABNORMAL LOW (ref 20.0–28.0)
Bicarbonate: 21.1 mmol/L (ref 20.0–28.0)
Drawn by: 11249
Drawn by: 270211
FIO2: 100
FIO2: 100
MECHVT: 500 mL
MECHVT: 490 mL
O2 Saturation: 97 %
O2 Saturation: 99.3 %
PEEP: 5 cmH2O
PEEP: 10 cmH2O
Patient temperature: 96.1
Patient temperature: 98.6
RATE: 16 resp/min
RATE: 22 resp/min
pCO2 arterial: 63.9 mmHg — ABNORMAL HIGH (ref 32.0–48.0)
pCO2 arterial: 47.9 mmHg (ref 32.0–48.0)
pH, Arterial: 7.077 — CL (ref 7.350–7.450)
pH, Arterial: 7.266 — ABNORMAL LOW (ref 7.350–7.450)
pO2, Arterial: 159 mmHg — ABNORMAL HIGH (ref 83.0–108.0)
pO2, Arterial: 474 mmHg — ABNORMAL HIGH (ref 83.0–108.0)

## 2021-06-16 LAB — COMPREHENSIVE METABOLIC PANEL
ALT: 9 U/L (ref 0–44)
ALT: 42 U/L (ref 0–44)
AST: 15 U/L (ref 15–41)
AST: 80 U/L — ABNORMAL HIGH (ref 15–41)
Albumin: 2.9 g/dL — ABNORMAL LOW (ref 3.5–5.0)
Albumin: 2.5 g/dL — ABNORMAL LOW (ref 3.5–5.0)
Alkaline Phosphatase: 48 U/L (ref 38–126)
Alkaline Phosphatase: 49 U/L (ref 38–126)
Anion gap: 10 (ref 5–15)
Anion gap: 15 (ref 5–15)
BUN: 60 mg/dL — ABNORMAL HIGH (ref 8–23)
BUN: 60 mg/dL — ABNORMAL HIGH (ref 8–23)
CO2: 25 mmol/L (ref 22–32)
CO2: 20 mmol/L — ABNORMAL LOW (ref 22–32)
Calcium: 8.3 mg/dL — ABNORMAL LOW (ref 8.9–10.3)
Calcium: 7.9 mg/dL — ABNORMAL LOW (ref 8.9–10.3)
Chloride: 102 mmol/L (ref 98–111)
Chloride: 102 mmol/L (ref 98–111)
Creatinine, Ser: 1.97 mg/dL — ABNORMAL HIGH (ref 0.61–1.24)
Creatinine, Ser: 2.3 mg/dL — ABNORMAL HIGH (ref 0.61–1.24)
GFR, Estimated: 34 mL/min — ABNORMAL LOW (ref >60)
GFR, Estimated: 28 mL/min — ABNORMAL LOW (ref >60)
Glucose, Bld: 150 mg/dL — ABNORMAL HIGH (ref 70–99)
Glucose, Bld: 257 mg/dL — ABNORMAL HIGH (ref 70–99)
Potassium: 3.9 mmol/L (ref 3.5–5.1)
Potassium: 4 mmol/L (ref 3.5–5.1)
Sodium: 137 mmol/L (ref 135–145)
Sodium: 137 mmol/L (ref 135–145)
Total Bilirubin: 0.4 mg/dL (ref 0.3–1.2)
Total Bilirubin: 0.4 mg/dL (ref 0.3–1.2)
Total Protein: 7.1 g/dL (ref 6.5–8.1)
Total Protein: 6.4 g/dL — ABNORMAL LOW (ref 6.5–8.1)

## 2021-06-16 LAB — MRSA NEXT GEN BY PCR, NASAL: MRSA by PCR Next Gen: NOT DETECTED

## 2021-06-16 LAB — BASIC METABOLIC PANEL
Anion gap: 9 (ref 5–15)
BUN: 64 mg/dL — ABNORMAL HIGH (ref 8–23)
CO2: 24 mmol/L (ref 22–32)
Calcium: 7.5 mg/dL — ABNORMAL LOW (ref 8.9–10.3)
Chloride: 105 mmol/L (ref 98–111)
Creatinine, Ser: 2.29 mg/dL — ABNORMAL HIGH (ref 0.61–1.24)
GFR, Estimated: 28 mL/min — ABNORMAL LOW (ref >60)
Glucose, Bld: 193 mg/dL — ABNORMAL HIGH (ref 70–99)
Potassium: 4.5 mmol/L (ref 3.5–5.1)
Sodium: 138 mmol/L (ref 135–145)

## 2021-06-16 LAB — CBC WITH DIFFERENTIAL/PLATELET
Abs Immature Granulocytes: 0.11 10*3/uL — ABNORMAL HIGH (ref 0.00–0.07)
Basophils Absolute: 0.1 10*3/uL (ref 0.0–0.1)
Basophils Relative: 0 %
Eosinophils Absolute: 0 10*3/uL (ref 0.0–0.5)
Eosinophils Relative: 0 %
HCT: 35.5 % — ABNORMAL LOW (ref 39.0–52.0)
Hemoglobin: 11.5 g/dL — ABNORMAL LOW (ref 13.0–17.0)
Immature Granulocytes: 1 %
Lymphocytes Relative: 13 %
Lymphs Abs: 2.1 10*3/uL (ref 0.7–4.0)
MCH: 31.3 pg (ref 26.0–34.0)
MCHC: 32.4 g/dL (ref 30.0–36.0)
MCV: 96.7 fL (ref 80.0–100.0)
Monocytes Absolute: 1.3 10*3/uL — ABNORMAL HIGH (ref 0.1–1.0)
Monocytes Relative: 8 %
Neutro Abs: 12.4 10*3/uL — ABNORMAL HIGH (ref 1.7–7.7)
Neutrophils Relative %: 78 %
Platelets: 344 10*3/uL (ref 150–400)
RBC: 3.67 MIL/uL — ABNORMAL LOW (ref 4.22–5.81)
RDW: 13.2 % (ref 11.5–15.5)
WBC: 16 10*3/uL — ABNORMAL HIGH (ref 4.0–10.5)
nRBC: 0 % (ref 0.0–0.2)

## 2021-06-16 LAB — CBC
HCT: 34.7 % — ABNORMAL LOW (ref 39.0–52.0)
Hemoglobin: 10.6 g/dL — ABNORMAL LOW (ref 13.0–17.0)
MCH: 31.5 pg (ref 26.0–34.0)
MCHC: 30.5 g/dL (ref 30.0–36.0)
MCV: 103 fL — ABNORMAL HIGH (ref 80.0–100.0)
Platelets: 344 10*3/uL (ref 150–400)
RBC: 3.37 MIL/uL — ABNORMAL LOW (ref 4.22–5.81)
RDW: 13.2 % (ref 11.5–15.5)
WBC: 20 10*3/uL — ABNORMAL HIGH (ref 4.0–10.5)
nRBC: 0.1 % (ref 0.0–0.2)

## 2021-06-16 LAB — PROTIME-INR
INR: 1.2 (ref 0.8–1.2)
Prothrombin Time: 14.9 seconds (ref 11.4–15.2)

## 2021-06-16 LAB — LACTIC ACID, PLASMA
Lactic Acid, Venous: 7.8 mmol/L (ref 0.5–1.9)
Lactic Acid, Venous: 4.5 mmol/L (ref 0.5–1.9)
Lactic Acid, Venous: 2.8 mmol/L (ref 0.5–1.9)
Lactic Acid, Venous: 3.4 mmol/L (ref 0.5–1.9)

## 2021-06-16 LAB — GLUCOSE, CAPILLARY
Glucose-Capillary: 143 mg/dL — ABNORMAL HIGH (ref 70–99)
Glucose-Capillary: 147 mg/dL — ABNORMAL HIGH (ref 70–99)
Glucose-Capillary: 190 mg/dL — ABNORMAL HIGH (ref 70–99)
Glucose-Capillary: 220 mg/dL — ABNORMAL HIGH (ref 70–99)

## 2021-06-16 LAB — MAGNESIUM: Magnesium: 3 mg/dL — ABNORMAL HIGH (ref 1.7–2.4)

## 2021-06-16 LAB — ECHOCARDIOGRAM COMPLETE
Area-P 1/2: 5.93 cm2
Calc EF: 71.5 %
Height: 67 in
S' Lateral: 3.9 cm
Single Plane A2C EF: 69 %
Single Plane A4C EF: 71.6 %
Weight: 2070.56 oz

## 2021-06-16 LAB — D-DIMER, QUANTITATIVE: D-Dimer, Quant: 3.94 ug/mL-FEU — ABNORMAL HIGH (ref 0.00–0.50)

## 2021-06-16 LAB — TROPONIN I (HIGH SENSITIVITY)
Troponin I (High Sensitivity): 25 ng/L — ABNORMAL HIGH (ref <18)
Troponin I (High Sensitivity): 65 ng/L — ABNORMAL HIGH (ref <18)

## 2021-06-16 LAB — FERRITIN: Ferritin: 241 ng/mL (ref 24–336)

## 2021-06-16 LAB — VITAMIN B12: Vitamin B-12: 1307 pg/mL — ABNORMAL HIGH (ref 180–914)

## 2021-06-16 LAB — FOLATE: Folate: 11.4 ng/mL (ref >5.9)

## 2021-06-16 MED ORDER — FENTANYL CITRATE (PF) 100 MCG/2ML IJ SOLN: 25.0000 ug | INTRAMUSCULAR | Status: DC | PRN

## 2021-06-16 MED ORDER — IPRATROPIUM-ALBUTEROL 0.5-2.5 (3) MG/3ML IN SOLN
3.0000 mL | Freq: Four times a day (QID) | RESPIRATORY_TRACT | Status: DC
  Administered 2021-06-16 – 2021-06-20 (×16): 3 mL via RESPIRATORY_TRACT
  Filled 2021-06-16 (×17): qty 3

## 2021-06-16 MED ORDER — CHLORHEXIDINE GLUCONATE 0.12% ORAL RINSE (MEDLINE KIT)
15.0000 mL | Freq: Two times a day (BID) | OROMUCOSAL | Status: DC
  Administered 2021-06-16 – 2021-06-20 (×9): 15 mL via OROMUCOSAL

## 2021-06-16 MED ORDER — ORAL CARE MOUTH RINSE
15.0000 mL | OROMUCOSAL | Status: DC
  Administered 2021-06-16 – 2021-06-20 (×43): 15 mL via OROMUCOSAL

## 2021-06-16 MED ORDER — PANTOPRAZOLE SODIUM 40 MG IV SOLR
40.0000 mg | Freq: Two times a day (BID) | INTRAVENOUS | Status: DC
  Administered 2021-06-16 – 2021-06-20 (×8): 40 mg via INTRAVENOUS
  Filled 2021-06-16 (×8): qty 40

## 2021-06-16 MED ORDER — METHYLPREDNISOLONE SODIUM SUCC 125 MG IJ SOLR
90.0000 mg | Freq: Two times a day (BID) | INTRAMUSCULAR | Status: DC
  Administered 2021-06-16 – 2021-06-17 (×3): 90 mg via INTRAVENOUS
  Filled 2021-06-16 (×4): qty 2

## 2021-06-16 MED ORDER — CHLORHEXIDINE GLUCONATE CLOTH 2 % EX PADS
6.0000 | MEDICATED_PAD | Freq: Every day | CUTANEOUS | Status: DC
  Administered 2021-06-17 – 2021-06-19 (×4): 6 via TOPICAL

## 2021-06-16 MED ORDER — DEXTROSE IN LACTATED RINGERS 5 % IV SOLN: INTRAVENOUS | Status: DC

## 2021-06-16 MED ORDER — MIDAZOLAM HCL 2 MG/2ML IJ SOLN
1.0000 mg | INTRAMUSCULAR | Status: DC | PRN
  Administered 2021-06-16: 1 mg via INTRAVENOUS
  Filled 2021-06-16: qty 2

## 2021-06-16 MED ORDER — IPRATROPIUM-ALBUTEROL 0.5-2.5 (3) MG/3ML IN SOLN: 3.0000 mL | RESPIRATORY_TRACT | Status: DC | PRN

## 2021-06-16 MED ORDER — SODIUM CHLORIDE 0.9 % IV SOLN
3.0000 g | Freq: Two times a day (BID) | INTRAVENOUS | Status: DC
  Administered 2021-06-16 – 2021-06-19 (×6): 3 g via INTRAVENOUS
  Filled 2021-06-16 (×3): qty 3
  Filled 2021-06-16: qty 8
  Filled 2021-06-16: qty 3
  Filled 2021-06-16 (×2): qty 8

## 2021-06-16 MED ORDER — FENTANYL CITRATE (PF) 100 MCG/2ML IJ SOLN
25.0000 ug | INTRAMUSCULAR | Status: DC | PRN
  Administered 2021-06-16: 100 ug via INTRAVENOUS
  Filled 2021-06-16: qty 2

## 2021-06-16 MED ORDER — CHLORHEXIDINE GLUCONATE CLOTH 2 % EX PADS: 6.0000 | MEDICATED_PAD | Freq: Every day | CUTANEOUS | Status: DC

## 2021-06-16 MED ORDER — LACTATED RINGERS IV BOLUS
1000.0000 mL | Freq: Once | INTRAVENOUS | Status: AC
  Administered 2021-06-16: 1000 mL via INTRAVENOUS

## 2021-06-16 MED ORDER — DOCUSATE SODIUM 50 MG/5ML PO LIQD
100.0000 mg | Freq: Two times a day (BID) | ORAL | Status: DC
  Administered 2021-06-17 – 2021-06-20 (×7): 100 mg
  Filled 2021-06-16 (×7): qty 10

## 2021-06-16 MED ORDER — FENTANYL CITRATE (PF) 100 MCG/2ML IJ SOLN
50.0000 ug | INTRAMUSCULAR | Status: DC | PRN
  Administered 2021-06-16 – 2021-06-17 (×4): 100 ug via INTRAVENOUS
  Administered 2021-06-17: 200 ug via INTRAVENOUS
  Filled 2021-06-16: qty 2
  Filled 2021-06-16: qty 4
  Filled 2021-06-16 (×2): qty 2

## 2021-06-16 MED ORDER — PIPERACILLIN-TAZOBACTAM 3.375 G IVPB
3.3750 g | Freq: Three times a day (TID) | INTRAVENOUS | Status: DC
  Administered 2021-06-16: 3.375 g via INTRAVENOUS
  Filled 2021-06-16: qty 50

## 2021-06-16 MED ORDER — PERFLUTREN LIPID MICROSPHERE
1.0000 mL | INTRAVENOUS | Status: DC | PRN
  Administered 2021-06-16: 2 mL via INTRAVENOUS
  Filled 2021-06-16: qty 10

## 2021-06-16 MED ORDER — PANTOPRAZOLE SODIUM 40 MG IV SOLR
40.0000 mg | INTRAVENOUS | Status: DC
  Administered 2021-06-16: 40 mg via INTRAVENOUS
  Filled 2021-06-16: qty 40

## 2021-06-16 MED ORDER — FAMOTIDINE IN NACL 20-0.9 MG/50ML-% IV SOLN
20.0000 mg | Freq: Every day | INTRAVENOUS | Status: DC
  Administered 2021-06-16 – 2021-06-19 (×4): 20 mg via INTRAVENOUS
  Filled 2021-06-16 (×4): qty 50

## 2021-06-16 MED ORDER — FENTANYL 2500MCG IN NS 250ML (10MCG/ML) PREMIX INFUSION
0.0000 ug/h | INTRAVENOUS | Status: DC
  Administered 2021-06-16: 25 ug/h via INTRAVENOUS
  Filled 2021-06-16: qty 250

## 2021-06-16 MED ORDER — MIDAZOLAM HCL 2 MG/2ML IJ SOLN: 1.0000 mg | INTRAMUSCULAR | Status: DC | PRN

## 2021-06-16 MED ORDER — POLYETHYLENE GLYCOL 3350 17 G PO PACK
17.0000 g | PACK | Freq: Every day | ORAL | Status: DC
  Administered 2021-06-17 – 2021-06-20 (×4): 17 g
  Filled 2021-06-16 (×4): qty 1

## 2021-06-16 MED ORDER — METHYLPREDNISOLONE SODIUM SUCC 125 MG IJ SOLR: 60.0000 mg | Freq: Three times a day (TID) | INTRAMUSCULAR | Status: DC

## 2021-06-16 NOTE — ED Provider Notes (Signed)
I was called to patient's room for CODE BLUE. On arrival, patient was receiving CPR in process.  Patient had copious amount of vomit in his mouth. Patient received epinephrine CPR was continued.  On pulse check he had bounding pulses and was sinus tachycardia.  He had an elevated blood pressure.  Patient was intubated by CRNA.  CPR Procedure Note I PERSONALLY DIRECTED ANCILLARY STAFF OR/PERFORMED CPR IN AN EFFORT TO REGAIN RETURN OF SPONTANEOUS CIRCULATION IN AN EFFORT TO MAINTAIN NEURO, CARDIAC AND SYSTEMIC PERFUSION   Discussed with Dr. Myna Hidalgo who assumes care of patient   Ripley Fraise, MD 06/16/21 [phone]

## 2021-06-16 NOTE — Progress Notes (Signed)
MD notified of poor UOP with only 82 ccs reading on bladder scan. Instructed to run 1000 cc LR bolus over an hour.

## 2021-06-16 NOTE — Progress Notes (Signed)
Pharmacy Antibiotic Note  Earl Castillo is a 80 y.o. male admitted on 06/30/2021 with  aspiration PNA  .  Pharmacy has been consulted for Unasyn dosing.  Plan: Unasyn 3 gr IV q12h   Height: '5\' 7"'$  (170.2 cm) Weight: 58.7 kg (129 lb 6.6 oz) IBW/kg (Calculated) : 66.1  Temp (24hrs), Avg:98.7 F (37.1 C), Min:98.7 F (37.1 C), Max:98.7 F (37.1 C)  Recent Labs  Lab 06/13/2021 1300 06/15/21 0415 06/16/21 0438 06/16/21 0626 06/16/21 0930  WBC 14.4* 12.7* 16.0* 20.0*  --   CREATININE 1.30* 1.85* 1.97* 2.30*  --   LATICACIDVEN  --   --   --  7.8* 4.5*     Estimated Creatinine Clearance: 21.6 mL/min (A) (by C-G formula based on SCr of 2.3 mg/dL (H)).    No Known Allergies    Thank you for allowing pharmacy to be a part of this patient's care.   Royetta Asal, PharmD, BCPS 06/16/2021 10:18 AM

## 2021-06-16 NOTE — Progress Notes (Signed)
Date and time results received: 06/16/21 10:30 PM  (use smartphrase ".now" to insert current time)  Test: lactic Critical Value: 3.4  Name of Provider Notified: [facility]  Orders Received? Or Actions Taken?: awaiting orders

## 2021-06-16 NOTE — Progress Notes (Signed)
MD notified of poor UOP with only 72 ccs reading on bladder scan. Instructed to run 1000 cc LR bolus over an hour.

## 2021-06-16 NOTE — Progress Notes (Signed)
Noted monitor center called to inform me that patients heart rate was in 50's,( noted previous rate was in the 60's.).  with noted narrow complex. Then called back 2 minutes later to say heart rate had trended down to the 30's. Exited a patients room 1428 and headed to 1425. Noted when passing monitor complex was wide and entering room. Noted palpable pulse but patient with slow and shallow respirations. Noted copious amounts of dark emesis around patients mouth and in side patients mouth. Code blue called with HEART RATE 28. CALLED FOR SUCTION AND AMBU BAG, CPR STARTED. SEE CODE BLUE SHEET

## 2021-06-16 NOTE — Consult Note (Addendum)
NAME:  IVEY Castillo, MRN:  [id number], DOB:  [birth year], LOS: 2 ADMISSION DATE:  06/05/2021, CONSULTATION DATE:  06/16/21 --  REFERRING MD:  Triad MD, CHIEF COMPLAINT:  Covid and cardiac arrest   History of Present Illness:   80 year old male with multiple medical problems including hyperlipidemia hypertension, severe peripheral arterial disease with status post stent and angioplasty to the left SFA in 2015, carotid artery disease with complex carotid lesion with cancellation of left carotid endarterectomy in July and August 2022 due to medical issues [angiogram in June 2022 showed 90% stenosis left side severely calcified], Hx of stroke on DAPT, I diastolic heart failure including admission in June 2022, lower GI bleeding in 2014 secondary to AVM , anemia smoker, emphysema on CT chest but?  On baseline treatment   . In June 2022 he had admission for for worsening chronic recurrent falls, acute hypoxic respiratory failure, failure to thrive and physical deconditioning and cachexia and acute kidney injury due to acute on chronic diastolic heart failure.  Discharged end of June 2022 to skilled nursing facility.  After that discharged home end of July 2022.  Left carotid and Earl. May cancel end of July 2022 in early August 2022 due to falls and confusion at home.  Treatment with ciprofloxacin for UTI as an outpatient on 06/06/2021.  And then diagnosis of COVID-19 on 06/12/2021 with prescription of Paxlovid with as outpatient. He is unvaccinated against COVID-19  Done on 06/13/2021 brought in and admitted because of continued failure to thrive, bedbound increased frequency of falls with diagnosis of acute kidney injury, treatment of COVID-19 with Paxlovid  Then on early hours of 06/16/2021 bradycardia followed by cardiac arrest.  ROSC with 1 round of epinephrine and less than 6 minutes of CPR.  Per documentation vomit noted at the time of intubation.  Initial ABG with respiratory acidosis.  Post cardiac arrest in  the ICU not fully responsive but on fentanyl infusion.  Left pupil dilated.  Right upper extremity is weak.  Son at the bedside feels is a new finding.   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   reports that he has been smoking cigarettes. He has a 14.50 pack-year smoking history. He has never used smokeless tobacco.  Past Surgical History:  Procedure Laterality Date   BAND HEMORRHOIDECTOMY     BASAL CELL CARCINOMA EXCISION  May 2009   per Earl. [name] , left nose    basket extraction of ureteral stone  1970's   Bypass rt leg Right 2000   capsule endoscopy  10-05-13   per Earl. [name], 4 AVM's   CARDIAC CATHETERIZATION  08/18/2003   Recommend CABG   CARDIOVASCULAR STRESS TEST - LEXISCAN  08/25/2012   No significant wall motion abnormalities   CAROTID ANGIOGRAM N/A 03/17/2014   Procedure: CAROTID Cyril Loosen;  Surgeon: Earl Harp, MD;  Location: [facility] CATH LAB;  Service: Cardiovascular;  Laterality: N/A;  right carotid  CAROTID ANGIOGRAPHY N/A 04/06/2021   Procedure: CAROTID ANGIOGRAPHY;  Surgeon: Sherren Kerns, MD;  Location[facility] INVASIVE CV LAB;  Service: Cardiovascular;  Laterality: N/A;   CAROTID ENDARTERECTOMY Right    CAROTID ENDARTERECTOMY  Right 03/26/2005   Earl Castillo   CATARACT EXTRACTION W/ INTRAOCULAR LENS  IMPLANT, BILATERAL Bilateral 2014   per Earl Earl Castillo   CERVICAL DISC SURGERY  1978   "bulging disc"   COLONOSCOPY  08-12-12   per Earl. Leone Payor, tubular adenomas, repeat in 5 yrs    CORONARY ARTERY BYPASS GRAFT  08/30/2003   x3. LIMA-distal LAD, RIMA-circumflex, SVG-distal RCA   CYSTOSCOPY W/ STONE MANIPULATION     ESOPHAGOGASTRODUODENOSCOPY N/A 08/17/2013   Procedure: ESOPHAGOGASTRODUODENOSCOPY (EGD);  Surgeon: Earl Carwin, MD;  Location: [facility] ENDOSCOPY;  Service: Endoscopy;  Laterality: N/A;   FEMORAL ARTERY STENT Left 05/23/2014   FEMORAL-FEMORAL BYPASS GRAFT  2003   FEMORAL-POPLITEAL BYPASS GRAFT Left 08/09/2016   Procedure: BYPASS GRAFT LEFT FEMORAL-POPLITEAL ARTERY USING GORE X 80CM PROPATEN GRAFT;  Surgeon: Earl Libman, MD;  Location: [facility] OR;  Service: Vascular;  Laterality: Left;   LOWER EXTREMITY ANGIOGRAM N/A 03/17/2014   Procedure: LOWER EXTREMITY ANGIOGRAM;  Surgeon: Earl Gess, MD;  Location: [facility] CATH LAB;  Service: Cardiovascular;  Laterality: N/A;   PERIPHERAL VASCULAR CATHETERIZATION N/A 06/06/2016   Procedure: Lower Extremity Angiography;  Surgeon: Earl Gess, MD;  Location: [facility] INVASIVE CV LAB;  Service: Cardiovascular;  Laterality: N/A;   PERIPHERAL VASCULAR CATHETERIZATION N/A 06/06/2016   Procedure: Abdominal Aortogram;  Surgeon: Earl Gess, MD;  Location: [facility] INVASIVE CV LAB;  Service: Cardiovascular;  Laterality: N/A;   RENAL DUPLEX Bilateral 02/02/2013   Bilateral renal arteries demonstrate narrowingconsistent with a 60-99% diameter reduction.   TRANSTHORACIC ECHOCARDIOGRAM  08/02/2013   EF 55-60%, normal echocardiogram    No Known Allergies  Immunization History  Administered Date(s) Administered   Influenza, High Dose Seasonal PF 06/28/2016, 08/22/2017, 08/28/2018   Influenza,inj,Quad PF,6+ Mos 08/18/2013, 07/01/2014, 06/30/2015   PFIZER Comirnaty(Gray Top)Covid-19  Tri-Sucrose Vaccine 03/01/2021   Pneumococcal Conjugate-13 08/04/2015   Pneumococcal Polysaccharide-23 08/18/2013   Tdap 12/23/2018    Family History  Problem Relation Age of Onset   Heart disease Father    Heart disease Sister    Coronary artery disease Brother        CABG x 6   Heart disease Brother    Coronary artery disease Brother        first MI 2, died at 65 during cardiac surgery   Coronary artery disease Sister        CABG x 5, RAS   Heart disease Sister    Colon cancer Neg Hx      Current Facility-Administered Medications:    0.9 %  sodium chloride infusion, 250 mL, Intravenous, PRN, Dorcas Carrow, MD   chlorhexidine gluconate (MEDLINE KIT) (PERIDEX) 0.12 % solution 15 mL, 15 mL, Mouth Rinse, BID, Henry Russel, MD, 15 mL at 06/16/21 0736   Chlorhexidine Gluconate Cloth 2 % PADS 6 each, 6 each, Topical, Daily, Ghimire, Kuber, MD   docusate (COLACE) 50 MG/5ML liquid 100 mg, 100 mg, Per Tube, BID, Opyd, Lavone Neri, MD   enoxaparin (LOVENOX) injection 30 mg, 30 mg, Subcutaneous, Q24H, Bell, Michelle T, RPH, 30 mg at 06/15/21 2111   famotidine (PEPCID) IVPB 20 mg premix, 20 mg, Intravenous, Daily, Henry Russel, MD   fentaNYL (SUBLIMAZE) injection 25 mcg, 25 mcg, Intravenous, Q15 min PRN, Opyd, Lavone Neri, MD  fentaNYL (SUBLIMAZE) injection 25-100 mcg, 25-100 mcg, Intravenous, Q30 min PRN, Opyd, Ilene Qua, MD, 100 mcg at 06/16/21 0645   fentaNYL 2541mcg in NS 235mL (35mcg/ml) infusion-PREMIX, 0-400 mcg/hr, Intravenous, Continuous, Mauri Brooklyn, MD, Last Rate: 2.5 mL/hr at 06/16/21 0700, 25 mcg/hr at 06/16/21 0700   ipratropium-albuterol (DUONEB) 0.5-2.5 (3) MG/3ML nebulizer solution 3 mL, 3 mL, Nebulization, Q4H PRN, Mauri Brooklyn, MD   MEDLINE mouth rinse, 15 mL, Mouth Rinse, 10 times per day, Mauri Brooklyn, MD   midazolam (VERSED) injection 1 mg, 1 mg, Intravenous, Q15 min PRN, Opyd, Ilene Qua, MD, 1 mg at 06/16/21 0646   midazolam (VERSED) injection 1 mg, 1 mg, Intravenous,  Q2H PRN, Opyd, Ilene Qua, MD   ondansetron (ZOFRAN) tablet 4 mg, 4 mg, Oral, Q6H PRN **OR** ondansetron (ZOFRAN) injection 4 mg, 4 mg, Intravenous, Q6H PRN, Barb Merino, MD   pantoprazole (PROTONIX) injection 40 mg, 40 mg, Intravenous, Q24H, Opyd, Timothy S, MD   piperacillin-tazobactam (ZOSYN) IVPB 3.375 g, 3.375 g, Intravenous, Q8H, Ghimire, Kuber, MD   polyethylene glycol (MIRALAX / GLYCOLAX) packet 17 g, 17 g, Per Tube, Daily, Opyd, Ilene Qua, MD   sodium chloride flush (NS) 0.9 % injection 3 mL, 3 mL, Intravenous, Q12H, Ghimire, Kuber, MD, 3 mL at 06/15/21 2113   sodium chloride flush (NS) 0.9 % injection 3 mL, 3 mL, Intravenous, PRN, Barb Merino, MD   Significant Hospital Events: Including procedures, antibiotic start and stop dates in addition to other pertinent events   06/21/2021 - admit 06/16/21 - cardiac arrest , epi x 1, ROSC -> ICU transfer.  PCCM takeover, ETT  Interim History / Subjective:  x  Objective   Blood pressure (!) 143/51, pulse 72, temperature 98.7 F (37.1 C), temperature source Oral, resp. rate 19, height $RemoveBe'5\' 7"'AQjjBIHwI$  (1.702 m), weight 58.7 kg, SpO2 100 %.    Vent Mode: PRVC FiO2 (%):  [100 %] 100 % Set Rate:  [16 bmp-22 bmp] 22 bmp Vt Set:  [490 mL-500 mL] 490 mL PEEP:  [5 cmH20-10 cmH20] 10 cmH20 Plateau Pressure:  [27 cmH20] 27 cmH20   Intake/Output Summary (Last 24 hours) at 06/16/2021 0748 Last data filed at 06/15/2021 2113 Gross per 24 hour  Intake 634.23 ml  Output 100 ml  Net 534.23 ml   Filed Weights   06/09/2021 2100  Weight: 58.7 kg    Examination: General: frail, cachexia HENT: Left pupul lager than right. No neck nodes, No elevated JVOP Lungs: Synch with vent, Cleart, 80% fio2, 10 peep Cardiovascular: normal heart sounds Abdomen: soft, non tender Extremities: intact, RUE appears without tone Neuro: On fent gtt RASS -3,  RUE weak GU: looks intact  Resolved Hospital Problem list   x  Assessment & Plan:  ASSESSMENT /  PLAN:    PULMONARY  A:  Smoker heavy at baseline Emphysema at [place] Acute resp failure with severe resp acidosis associated with cardiac arrest 06/16/21  06/16/2021 -> post admit acute resp failure 06/16/21 following cardiac arrest. Acute resp  P:   PRVC BD VAP bundle   NEUROLOGIC A:   Baseline prior to admit Hx of strokes NOS - on DAPT Severe Left carotid artery stenosis June 2022 - complex surgery pending by [facility]  Current 8/13  - AT risk post cardiac anoxic encephalopathy  - High concerns for new stroke with Rigith side  weakness - due to covid and underlyiung carotid stensosi  P:   Head CT stat Can consider ceribell EEG Minatain normothermia RASS goal 0 to -2  -  dc fent gtt  - do fent prn - do versed prn - consider diprivan if bp ok   VASCULAR A:   Baseline prior to admit:   - Severe PAD and carotid stenosis - Left CEA cancelled 06/01/21 due to falls/confusion at home and again 06/06/21 due to UTI - On DAPT  06/16/21 - post cardiac arrest maintains BP  P:  MAP goal > 65  CARDIAC STRUCTURAL A: Baseline prior to Admit  - STrong family hx of CAD  -  Normal ECHO June 2022 - Hx of acute diastolic CHF admission - June 2022     P: Echo Cycle enzyems  CARDIAC ELECTRICAL A: Cardiac arrest - PEA v Asystole 06/16/21 - ROSC after 1 epi. Unclear cause   P: Tele monitor  INFECTIOUS A:   Baseline prior to admit  - Hx of UTI NOS 06/06/21 prior to admit: Rx Cipro by PCP Current at admit  - COVID-19 06/26/2021 admit diagnosis - Concern for aspiration 06/16/21  P:   DC paxlovid Solumedrol for covid Unasyn for possible aspiration Check PCT   RENAL A:  Baseline prior to admit  - CKD baseline creat 0.$RemoveBeforeDE'86mg'QlQaWGQHthnvcUG$ % - 1.$Rem'0mg'Wweq$ % in 2022 - Hx of AKI hospitalization June 2022 - Pk creat 1.$RemoveBe'7mg'GPbOihFlj$ % June 2022  Present on admission  - AKI creat 1.$RemoveBef'3mg'bVLiPPfOXR$ % on 06/1121 - due to covid 19  06/16/2021- worse post arrest to 2.$RemoveBef'3mg'FkspHdWpvS$ % and in  acuterenal failure  P:   hydrate  ELECTROLYTES A: at risk electrolyte imbalance  P: Replete and monitor   GASTROINTESTINAL A:   Hx of AVMs at baseline prior to admit Severe protein calorie malnutrition - documented June 2022 (alb 2.9-3) Currently at admit: worsening failure to thrive and on 8/13: coffee ground OG returns    P:   TF PPI  BID NPO DC lovenox SQ No DAPT  HEMATOLOGIC   - HEME A:  Mild anemia at baseline prior to admit - 12gm%  06/16/2021 - no active bleeding. Anemia worse 11gm% due to acute illness   P:  - PRBC for hgb </= 6.9gm%    - exceptions are   -  if ACS susepcted/confirmed then transfuse for hgb </= 8.0gm%,  or    -  active bleeding with hemodynamic instability, then transfuse regardless of hemoglobin value   At at all times try to transfuse 1 unit prbc as possible with exception of active hemorrhage   HEMATOLOGIC - Platelets A At risk thrombocytopenia At high risk DVT/PE due to covid  P Lovenox DVT proph Check duplex LE  ENDOCRINE A:   At risk hyperglycemia   P:   ssi  MSK/DERM Baseline prior to and at admit   - failure to thrive with physical deconditioning and cachexia (documented July 2022)  - outpatient palliative care  - recurrent falls (non compliance with walker) - SNF resident [facility]) June 2022 -> 05/26/21 -> then at home - Severe Protein Calorie Malnutrition (documented June 2022 dc note) - full code  Plan  - anticipates challenges liberating from ventilator and recovery   Best Practice (right click and "Reselect all SmartList Selections" daily)   Diet/type: tubefeeds DVT prophylaxis: LMWH GI prophylaxis: PPI Lines: N/A Foley:  N/A Code Status:  full code Last date of multidisciplinary goals of care discussion Blane Ohara updated at bedsidde in ICU 06/16/21 - full code. Explained to him that survival chances are limited. He is processing. ]      ATTESTATION & SIGNATURE   The patient Earl Castillo  is critically ill with  multiple organ systems failure and requires high complexity decision making for assessment and support, frequent evaluation and titration of therapies, application of advanced monitoring technologies and extensive interpretation of multiple databases.   Critical Care Time devoted to patient care services described in this note is  90  Minutes. This time reflects time of care of this signee Earl Brand Males. This critical care time does not reflect procedure time, or teaching time or supervisory time of PA/NP/Med student/Med Resident etc but could involve care discussion time     Earl. Brand Males, M.D., [facility].C.P Pulmonary and Critical Care Medicine Staff Physician [facility] Pager: [phone], If no answer or between  15:00h - 7:00h: call [phone]  06/16/2021 7:48 AM   LABS    PULMONARY Recent Labs  Lab 06/16/21 0638  PHART 7.077*  PCO2ART 63.9*  PO2ART 159*  HCO3 18.5*  O2SAT 97.0    CBC Recent Labs  Lab 06/15/21 0415 06/16/21 0438 06/16/21 0626  HGB 11.2* 11.5* 10.6*  HCT 34.3* 35.5* 34.7*  WBC 12.7* 16.0* 20.0*  PLT 304 344 344    COAGULATION No results for input(s): INR in the last 168 hours.  CARDIAC  No results for input(s): TROPONINI in the last 168 hours. No results for input(s): PROBNP in the last 168 hours.   CHEMISTRY Recent Labs  Lab 06/07/2021 1300 06/15/21 0415 06/16/21 0438 06/16/21 0626  NA 138 136 137 137  K 2.9* 3.6 3.9 4.0  CL 100 100 102 102  CO2 $Re'28 24 25 'DSM$ 20*  GLUCOSE 155* 140* 150* 257*  BUN 32* 43* 60* 60*  CREATININE 1.30* 1.85* 1.97* 2.30*  CALCIUM 9.0 8.6* 8.3* 7.9*  MG  --  2.2  --  3.0*  PHOS  --  4.7*  --   --    Estimated Creatinine Clearance: 21.6 mL/min (A) (by C-G formula based on SCr of 2.3 mg/dL (H)).   LIVER Recent Labs  Lab 06/18/2021 1300 06/15/21 0415 06/16/21 0438 06/16/21 0626  AST $Re'16 16 15 'UdN$ 80*  ALT $Re'10 9 9 'LYP$ 42  ALKPHOS 59 51 48 49  BILITOT 0.6  0.5 0.4 0.4  PROT 7.9 7.0 7.1 6.4*  ALBUMIN 3.2* 2.9* 2.9* 2.5*     INFECTIOUS Recent Labs  Lab 06/16/2021 1608 06/15/21 0415 06/16/21 0626  LATICACIDVEN  --   --  7.8*  PROCALCITON <0.10 <0.10  --      ENDOCRINE CBG (last 3)  No results for input(s): GLUCAP in the last 72 hours.       IMAGING x48h  - image(s) personally visualized  -   highlighted in bold DG Chest Port 1 View  Result Date: 06/16/2021 CLINICAL DATA:  Cardiac arrest EXAM: PORTABLE CHEST 1 VIEW COMPARISON:  06/07/2021 FINDINGS: Endotracheal tube 1.5 cm from carina. Sternal wires overlie normal cardiac silhouette. Mild central pulmonary edema pattern increased from prior. No pneumothorax. Lungs well expanded. IMPRESSION: 1. Endotracheal tube 1.5 cm from carina. 2. Lungs well expanded. 3. New central mild pulmonary edema pattern. Electronically Signed   By: Suzy Bouchard M.D.   On: 06/16/2021 06:50   DG Chest Port 1 View  Result Date: 06/10/2021 CLINICAL DATA:  Not able to walk.  Recent COVID diagnosis EXAM: PORTABLE CHEST 1 VIEW COMPARISON:  04/12/2021 FINDINGS: Prior median sternotomy. Heart size within normal limits. Atherosclerotic aortic arch. Vascular congestion with mild bilateral airspace disease. This could represent pneumonia or mild  edema. No effusion. IMPRESSION: Mild bilateral airspace disease with slight progression. Question fluid overload versus pneumonia. Electronically Signed   By: Franchot Gallo M.D.   On: 06/13/2021 15:02

## 2021-06-16 NOTE — Progress Notes (Signed)
eLink Physician-Brief Progress Note Patient Name: Earl Castillo DOB: Jun 23, 1941 MRN: [id number]   Date of Service  06/16/2021  HPI/Events of Note  16 old male with PVD, HFpEF admitted with COVID and profound weakness on abx for CAP had cardiac arrest with bradycardia.  ROSC with epi x 1 and intubation.  Vomiting noted when intubated.  Initial ABG shows resp acidosis.  eICU Interventions  Increasing minute ventilation on vent to correct acidemia. Fentanyl for sedation. Abx broadened by hospitalist team. Follow neuro exam Supportive care     Intervention Category Evaluation Type: New Patient Evaluation  Mauri Brooklyn, P 06/16/2021, 7:03 AM

## 2021-06-16 NOTE — Significant Event (Signed)
CODE BLUE note:   Brief HPI: Earl Castillo is a 80 year old gentleman with peripheral arterial disease, HFpEF, hypertension, hyperlipidemia, and GERD who presented to the emergency department on 06/22/2021 with profound weakness and fatigue in the setting of COVID-19.  He had developed cough and congestion on 06/10/2021 after exposure to a COVID-positive family member, started Paxil that on 06/13/2021, but developed profound fatigue and general weakness to the point where he is unable to get off of his couch.  He was admitted to the hospitalist service, started on Rocephin and azithromycin in light of elevated WBC, was on room air yesterday, and preparing for discharge.  Acute event: Patient complained of indigestion last night but otherwise was having an uneventful night when he was noted to become bradycardic on the monitor.  RN reports heart rate in the 20s.  There was no pulse and CODE BLUE was called.  He received high-quality CPR, 1 dose of epinephrine, regained pulse with sinus rhythm, and was intubated by CRNA.  There was copious vomitus noted during intubation.  Blood pressure was 0000000 systolic and oxygen saturation 100%.  He was not responding.  He was suctioned and transferred to the ICU.  Suspect aspiration event leading to hypoxia, profound bradycardia, and arrest.  Plan to obtain blood work including ABG, start Protonix and Zosyn, place enteric tube to suction.   Discussed with PCCM, appreciate their help.  Patient's son, Earl Castillo, was reached by phone and updated on these events.

## 2021-06-16 NOTE — Progress Notes (Signed)
RT responded to code blue. RT bagged pt and he was intubated by MD. Suctioned Pt. Transported while bagging him from [id number] to [id number] without any complications. Pt placed on the vent and report given to unit RT.

## 2021-06-16 NOTE — Progress Notes (Signed)
Pharmacy Antibiotic Note  Earl Castillo is a 80 y.o. male admitted on 06/27/2021 with  aspiration PNA  .  Pharmacy has been consulted for Zosyn dosing.  Plan: Zosyn 3.375g IV q8h (4 hour infusion).  Height: '5\' 7"'$  (170.2 cm) Weight: 58.7 kg (129 lb 6.6 oz) IBW/kg (Calculated) : 66.1  Temp (24hrs), Avg:98.8 F (37.1 C), Min:98.7 F (37.1 C), Max:98.9 F (37.2 C)  Recent Labs  Lab 06/10/2021 1300 06/15/21 0415 06/16/21 0438 06/16/21 0626  WBC 14.4* 12.7* 16.0* 20.0*  CREATININE 1.30* 1.85* 1.97*  --     Estimated Creatinine Clearance: 25.2 mL/min (A) (by C-G formula based on SCr of 1.97 mg/dL (H)).    No Known Allergies    Thank you for allowing pharmacy to be a part of this patient's care.   Royetta Asal, PharmD, BCPS 06/16/2021 6:58 AM

## 2021-06-16 NOTE — Anesthesia Procedure Notes (Signed)
Procedure Name: Intubation Date/Time: 06/16/2021 6:06 AM Performed by: Gean Maidens, CRNA Pre-anesthesia Checklist: Patient identified, Emergency Drugs available, Suction available, Patient being monitored and Timeout performed Patient Re-evaluated:Patient Re-evaluated prior to induction Oxygen Delivery Method: Ambu bag Preoxygenation: Pre-oxygenation with 100% oxygen Laryngoscope Size: Mac and 4 Grade View: Grade II Tube type: Oral Tube size: 7.5 mm Number of attempts: 1 Airway Equipment and Method: Stylet Placement Confirmation: ETT inserted through vocal cords under direct vision, CO2 detector and breath sounds checked- equal and bilateral Secured at: 23 cm Tube secured with: Tape Dental Injury: Teeth and Oropharynx as per pre-operative assessment

## 2021-06-17 ENCOUNTER — Inpatient Hospital Stay (HOSPITAL_COMMUNITY): Payer: Medicare Other

## 2021-06-17 ENCOUNTER — Other Ambulatory Visit: Payer: Self-pay

## 2021-06-17 DIAGNOSIS — R7989 Other specified abnormal findings of blood chemistry: Secondary | ICD-10-CM | POA: Diagnosis not present

## 2021-06-17 DIAGNOSIS — U071 COVID-19: Secondary | ICD-10-CM | POA: Diagnosis not present

## 2021-06-17 DIAGNOSIS — Z66 Do not resuscitate: Secondary | ICD-10-CM | POA: Diagnosis not present

## 2021-06-17 DIAGNOSIS — N179 Acute kidney failure, unspecified: Secondary | ICD-10-CM | POA: Diagnosis not present

## 2021-06-17 DIAGNOSIS — I9789 Other postprocedural complications and disorders of the circulatory system, not elsewhere classified: Secondary | ICD-10-CM

## 2021-06-17 DIAGNOSIS — I469 Cardiac arrest, cause unspecified: Secondary | ICD-10-CM | POA: Diagnosis not present

## 2021-06-17 DIAGNOSIS — G9340 Encephalopathy, unspecified: Secondary | ICD-10-CM

## 2021-06-17 LAB — CBC WITH DIFFERENTIAL/PLATELET
Abs Immature Granulocytes: 0.13 10*3/uL — ABNORMAL HIGH (ref 0.00–0.07)
Basophils Absolute: 0 10*3/uL (ref 0.0–0.1)
Basophils Relative: 0 %
Eosinophils Absolute: 0 10*3/uL (ref 0.0–0.5)
Eosinophils Relative: 0 %
HCT: 30.6 % — ABNORMAL LOW (ref 39.0–52.0)
Hemoglobin: 9.7 g/dL — ABNORMAL LOW (ref 13.0–17.0)
Immature Granulocytes: 1 %
Lymphocytes Relative: 2 %
Lymphs Abs: 0.4 10*3/uL — ABNORMAL LOW (ref 0.7–4.0)
MCH: 31.3 pg (ref 26.0–34.0)
MCHC: 31.7 g/dL (ref 30.0–36.0)
MCV: 98.7 fL (ref 80.0–100.0)
Monocytes Absolute: 0.4 10*3/uL (ref 0.1–1.0)
Monocytes Relative: 2 %
Neutro Abs: 14.9 10*3/uL — ABNORMAL HIGH (ref 1.7–7.7)
Neutrophils Relative %: 95 %
Platelets: 224 10*3/uL (ref 150–400)
RBC: 3.1 MIL/uL — ABNORMAL LOW (ref 4.22–5.81)
RDW: 13.3 % (ref 11.5–15.5)
WBC: 15.8 10*3/uL — ABNORMAL HIGH (ref 4.0–10.5)
nRBC: 0 % (ref 0.0–0.2)

## 2021-06-17 LAB — TROPONIN I (HIGH SENSITIVITY): Troponin I (High Sensitivity): 125 ng/L (ref <18)

## 2021-06-17 LAB — GLUCOSE, CAPILLARY
Glucose-Capillary: 183 mg/dL — ABNORMAL HIGH (ref 70–99)
Glucose-Capillary: 181 mg/dL — ABNORMAL HIGH (ref 70–99)
Glucose-Capillary: 184 mg/dL — ABNORMAL HIGH (ref 70–99)
Glucose-Capillary: 169 mg/dL — ABNORMAL HIGH (ref 70–99)
Glucose-Capillary: 161 mg/dL — ABNORMAL HIGH (ref 70–99)
Glucose-Capillary: 202 mg/dL — ABNORMAL HIGH (ref 70–99)

## 2021-06-17 LAB — COMPREHENSIVE METABOLIC PANEL
ALT: 34 U/L (ref 0–44)
AST: 41 U/L (ref 15–41)
Albumin: 2.4 g/dL — ABNORMAL LOW (ref 3.5–5.0)
Alkaline Phosphatase: 39 U/L (ref 38–126)
Anion gap: 8 (ref 5–15)
BUN: 69 mg/dL — ABNORMAL HIGH (ref 8–23)
CO2: 23 mmol/L (ref 22–32)
Calcium: 7.6 mg/dL — ABNORMAL LOW (ref 8.9–10.3)
Chloride: 107 mmol/L (ref 98–111)
Creatinine, Ser: 2.46 mg/dL — ABNORMAL HIGH (ref 0.61–1.24)
GFR, Estimated: 26 mL/min — ABNORMAL LOW (ref >60)
Glucose, Bld: 221 mg/dL — ABNORMAL HIGH (ref 70–99)
Potassium: 4 mmol/L (ref 3.5–5.1)
Sodium: 138 mmol/L (ref 135–145)
Total Bilirubin: 0.4 mg/dL (ref 0.3–1.2)
Total Protein: 6.1 g/dL — ABNORMAL LOW (ref 6.5–8.1)

## 2021-06-17 LAB — LACTIC ACID, PLASMA: Lactic Acid, Venous: 3 mmol/L (ref 0.5–1.9)

## 2021-06-17 MED ORDER — ASPIRIN 81 MG PO CHEW
81.0000 mg | CHEWABLE_TABLET | Freq: Every day | ORAL | Status: DC
  Administered 2021-06-17 – 2021-06-20 (×4): 81 mg
  Filled 2021-06-17 (×4): qty 1

## 2021-06-17 MED ORDER — INSULIN ASPART 100 UNIT/ML IJ SOLN
0.0000 [IU] | INTRAMUSCULAR | Status: DC
  Administered 2021-06-17: 5 [IU] via SUBCUTANEOUS
  Administered 2021-06-17 (×3): 3 [IU] via SUBCUTANEOUS
  Administered 2021-06-18 (×3): 8 [IU] via SUBCUTANEOUS
  Administered 2021-06-18: 5 [IU] via SUBCUTANEOUS
  Administered 2021-06-18: 8 [IU] via SUBCUTANEOUS
  Administered 2021-06-18 – 2021-06-19 (×3): 5 [IU] via SUBCUTANEOUS

## 2021-06-17 MED ORDER — DILTIAZEM HCL 25 MG/5ML IV SOLN
20.0000 mg | Freq: Once | INTRAVENOUS | Status: AC
  Administered 2021-06-17: 20 mg via INTRAVENOUS
  Filled 2021-06-17: qty 5

## 2021-06-17 MED ORDER — AMIODARONE IV BOLUS ONLY 150 MG/100ML
INTRAVENOUS | Status: AC
  Filled 2021-06-17: qty 100

## 2021-06-17 MED ORDER — PIVOT 1.5 CAL PO LIQD
1000.0000 mL | ORAL | Status: DC
  Administered 2021-06-17: 1000 mL
  Filled 2021-06-17: qty 1000

## 2021-06-17 MED ORDER — FREE WATER
30.0000 mL | Status: DC
Start: 2021-06-17 — End: 2021-06-20
  Administered 2021-06-17 – 2021-06-20 (×17): 30 mL

## 2021-06-17 MED ORDER — FENTANYL CITRATE (PF) 100 MCG/2ML IJ SOLN
25.0000 ug | INTRAMUSCULAR | Status: DC | PRN
Start: 2021-06-17 — End: 2021-06-18
  Administered 2021-06-17 – 2021-06-18 (×4): 25 ug via INTRAVENOUS
  Filled 2021-06-17 (×4): qty 2

## 2021-06-17 MED ORDER — MIDAZOLAM HCL 2 MG/2ML IJ SOLN: 1.0000 mg | INTRAMUSCULAR | Status: DC | PRN

## 2021-06-17 MED ORDER — METHYLPREDNISOLONE SODIUM SUCC 125 MG IJ SOLR
80.0000 mg | Freq: Every day | INTRAMUSCULAR | Status: DC
  Administered 2021-06-18 – 2021-06-20 (×3): 80 mg via INTRAVENOUS
  Filled 2021-06-17 (×3): qty 2

## 2021-06-17 MED ORDER — AMIODARONE LOAD VIA INFUSION
150.0000 mg | Freq: Once | INTRAVENOUS | Status: AC
  Administered 2021-06-17: 150 mg via INTRAVENOUS
  Filled 2021-06-17: qty 83.34

## 2021-06-17 MED ORDER — AMIODARONE HCL IN DEXTROSE 360-4.14 MG/200ML-% IV SOLN
60.0000 mg/h | INTRAVENOUS | Status: AC
  Administered 2021-06-17 (×2): 60 mg/h via INTRAVENOUS
  Filled 2021-06-17: qty 200

## 2021-06-17 MED ORDER — AMIODARONE HCL IN DEXTROSE 360-4.14 MG/200ML-% IV SOLN
30.0000 mg/h | INTRAVENOUS | Status: DC
  Administered 2021-06-18 – 2021-06-20 (×6): 30 mg/h via INTRAVENOUS
  Filled 2021-06-17 (×6): qty 200

## 2021-06-17 MED ORDER — FUROSEMIDE 10 MG/ML IJ SOLN
80.0000 mg | Freq: Once | INTRAMUSCULAR | Status: AC
  Administered 2021-06-17: 80 mg via INTRAVENOUS
  Filled 2021-06-17: qty 8

## 2021-06-17 MED ORDER — CLOPIDOGREL BISULFATE 75 MG PO TABS
75.0000 mg | ORAL_TABLET | Freq: Every day | ORAL | Status: DC
  Administered 2021-06-17 – 2021-06-20 (×4): 75 mg
  Filled 2021-06-17 (×4): qty 1

## 2021-06-17 MED FILL — Medication: Qty: 1 | Status: AC

## 2021-06-17 NOTE — Progress Notes (Signed)
Lower extremity venous has been completed.   Preliminary results in CV Proc.   Abram Sander 06/17/2021 1:09 PM

## 2021-06-17 NOTE — Progress Notes (Addendum)
eLink Physician-Brief Progress Note Patient Name: Earl Castillo DOB: April 20, 1941 MRN: [id number]   Date of Service  06/17/2021  HPI/Events of Note  Notified of rapid atrial fibrillation.  He in the 150s-170s, BP 157/81.  Pt remains intubated and does not appear to be in distress.   Pt is not on anticoagulation.  He had coffee ground emesis.   eICU Interventions  Give amiodarone '150mg'$  IV bolus and start on amiodarone gtt. Hold off on anticoagulation at this time.     Intervention Category Major Interventions: Arrhythmia - evaluation and management  Elsie Lincoln 06/17/2021, 7:25 PM  11:05 PM HR remains in the 140s-160s.  BP 177/76 while on amiodarone gtt.   Plan> Give cardizem '20mg'$  IV bolus.   2:21 AM HR transiently went down into the 90s after the cardizem bolus but is back up to the 120s-140s now.   Plan> Start on cardizem gtt.  Amiodarone gtt now at 0.'5mg'$ /hr.

## 2021-06-17 NOTE — Progress Notes (Signed)
eLink Physician-Brief Progress Note Patient Name: Earl Castillo DOB: 1941/11/03 MRN: [id number]   Date of Service  06/17/2021  HPI/Events of Note  Notified by nurse at approx 2000 last night and again this am of poor UOP.  D5LR stopped because of elevated glucose.  BP has been normal and elevated at times.  Evolving acute renal failure related to anoxic event.  eICU Interventions  Continue to monitor renal fxn and UOP.  Follow blood glucose.     Intervention Category Major Interventions: Hyperglycemia - active titration of insulin therapy  Mauri Brooklyn, P 06/17/2021, 4:17 AM

## 2021-06-17 NOTE — Consult Note (Signed)
NAME:  Earl Castillo, MRN:  [id number], DOB:  [birth year], LOS: 3 ADMISSION DATE:  06/13/2021, CONSULTATION DATE:  06/16/21 --  REFERRING MD:  Triad MD, CHIEF COMPLAINT:  Covid and cardiac arrest   brief   80 year old male with multiple medical problems including hyperlipidemia hypertension, severe peripheral arterial disease with status post stent and angioplasty to the left SFA in 2015, carotid artery disease with complex carotid lesion with cancellation of left carotid endarterectomy in July and August 2022 due to medical issues [angiogram in June 2022 showed 90% stenosis left side severely calcified], Hx of stroke on DAPT, I diastolic heart failure including admission in June 2022, lower GI bleeding in 2014 secondary to AVM , anemia smoker, emphysema on CT chest but?  On baseline treatment   . In June 2022 he had admission for for worsening chronic recurrent falls, acute hypoxic respiratory failure, failure to thrive and physical deconditioning and cachexia and acute kidney injury due to acute on chronic diastolic heart failure.  Discharged end of June 2022 to skilled nursing facility.  After that discharged home end of July 2022.  Left carotid and Dr. May cancel end of July 2022 in early August 2022 due to falls and confusion at home.  Treatment with ciprofloxacin for UTI as an outpatient on 06/06/2021.  And then diagnosis of COVID-19 on 06/12/2021 with prescription of Paxlovid with as outpatient. He is unvaccinated against COVID-19.    On 07/04/2021 brought in and admitted because of continued failure to thrive, bedbound increased frequency of falls with diagnosis of acute kidney injury, treatment of COVID-19 with Paxlovid. On  8/12 pm had c.o indigesttion but otherwise well. On early hours of 06/16/2021 bradycardia followed by cardiac arrest.  ROSC with 1 round of epinephrine and less than 6 minutes of CPR.  Per documentation vomit noted at the time of intubation.  Initial ABG with respiratory acidosis.   Post cardiac arrest in the ICU not fully responsive but on fentanyl infusion.  Left pupil dilated.  Right upper extremity is weak.  Son at the bedside feels is a new finding. CT head without acute  stroke/acute findings but Remote right thalamic and right' \\parieto'$ -occipital cortical infarcts. Advanced intracranial atherosclerotic disease.   Pertinent  Medical History    has a past medical history of Anemia, Angiodysplasia of intestine - small bowel  (09/21/2013), Blood transfusion abn reaction or complication, no procedure mishap, Cancer ([place]), Carotid artery disease ([place]), CHF (congestive heart failure) ([place]) (04/09/2021), Chicken pox, Colon polyps (08/2012), Coronary artery disease, GERD (gastroesophageal reflux disease), Hemorrhoid, History of blood transfusion (08/2013), History of kidney stones, Hyperlipidemia, Hypertension, 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 (08/17/2013), Lower GI bleeding (08/2013), Osteoarthritis, Peripheral arterial disease ([place]), Pneumonia, Sinus headache, Skin exam, screening for cancer, Stroke ([place]), and Vitamin B12 deficiency (11/23/2013).   has a past surgical history that includes Cervical disc surgery (1978); Bypass rt leg (Right, 2000); Carotid endarterectomy (Right); basket extraction of ureteral stone (1970's); Excision basal cell carcinoma (May 2009); Colonoscopy (08-12-12); Cataract extraction w/ intraocular lens  implant, bilateral (Bilateral, 2014); Esophagogastroduodenoscopy (N/A, 08/17/2013); Cardiac catheterization (08/18/2003); Carotid endarterectomy (Right, 03/26/2005); CARDIOVASCULAR STRESS TEST - LEXISCAN (08/25/2012); transthoracic echocardiogram (08/02/2013); RENAL DUPLEX (Bilateral, 02/02/2013); capsule endoscopy (10-05-13); Femoral artery stent (Left, 05/23/2014); Band hemorrhoidectomy; Femoral-femoral Bypass Graft (2003); Coronary artery bypass graft (08/30/2003); Cystoscopy w/ stone manipulation; lower extremity  angiogram (N/A, 03/17/2014); carotid angiogram (N/A, 03/17/2014); Cardiac catheterization (N/A, 06/06/2016); Cardiac catheterization (N/A, 06/06/2016); Femoral-popliteal Bypass Graft (Left, 08/09/2016); and  CAROTID ANGIOGRAPHY (N/A, 04/06/2021).    Significant Hospital Events: Including procedures, antibiotic start and stop dates in addition to other pertinent events   06/24/2021 - admit/. Urine leg/strep neg. Covid PCR positive 06/16/21 - cardiac arrest , epi x 1, ROSC -> ICU transfer.  PCCM takeover, ETT. ECHo -LVEF 65%.  Pseudonormalization with grade 2 diastolic dysfunction and elevated LVEDP.  Normal right ventricular function D-dimer went up from 1.2 to 3.94  Interim History / Subjective:   8/14 -in acute renal failure despite fluids  On the ventilator FiO2 30%.  Trop pk 125.  Is comatose with just as needed sedation.  No posturing.  Right upper extremity remains weak.  CT head without any acute stroke.  Not on pressors.  Spot cerebellar EEG done -> Per Dr. Hortense Ramal left greater than right slowing but no seizures.  EEG can be stopped.  Son [name] DPOA at the bedside.  Objective   Blood pressure (!) 129/37, pulse 85, temperature 98.3 F (36.8 C), temperature source Axillary, resp. rate (!) 22, height '5\' 7"'$  (1.702 m), weight 58.7 kg, SpO2 99 %.    Vent Mode: PRVC FiO2 (%):  [30 %-40 %] 30 % Set Rate:  [22 bmp] 22 bmp Vt Set:  [420 mL-490 mL] 490 mL PEEP:  [5 cmH20] 5 cmH20 Plateau Pressure:  [17 cmH20-20 cmH20] 18 cmH20   Intake/Output Summary (Last 24 hours) at 06/17/2021 1109 Last data filed at 06/17/2021 0800 Gross per 24 hour  Intake 3013.17 ml  Output 160 ml  Net 2853.17 ml   Filed Weights   06/12/2021 2100  Weight: 58.7 kg    Examination: General: frail, cachexia HENT: Left pupul lager than right. No neck nodes, No elevated JVOP Lungs: Synch with vent, Cleart, 80% fio2, 10 peep Cardiovascular: normal heart sounds Abdomen: soft, non tender Extremities: intact, RUE appears without  tone Neuro: On fent gtt RASS -3,  RUE weak GU: looks intact  Resolved Hospital Problem list   x  Assessment & Plan:     Smoker heavy at baseline prior to and present at admission Emphysema at [place]) present at admission Acute resp failure with severe resp acidosis associated with cardiac arrest 06/16/21 -   06/17/2021 - > does not meet criteria for SBT/Extubation in setting of Acute Respiratory Failure due to coma and renal failure   P:   PRVC BD VAP bundle   Hx of strokes NOS - on DAPT Hx of Severe > 90% Left carotid artery stenosis June 2022 - complex surgery pending by [facility] Current 8/13: Post cardiac arrest concern for anoxic encephalopathy and coma (no acute findings on CT head 06/16/2021)   06/17/2021: Unresponsive and comatose.  No seizure activity on spot EEG  P:   Minatain normothermia RASS goal 0 (currently -4 equivalent]  - do fent prn - do versed prn - consider diprivan if bp ok -    Severe  PAD and carotid stenosis -baseline prior to admit -  Left CEA cancelled 06/01/21 due to falls/confusion at home and again 06/06/21 due to UTI - On DAPT  06/17/21 - post cardiac arrest maintains BP  P:  MAP goal > 65 Resume DAPT (held on 06/16/2021 due to concerns of coffee-ground emesis]   Baseline prior to end of admit: STrong family hx of CAD  .  Normal ECHO June 2022. Hx of acute diastolic CHF admission - June 2022 Current:  Cardiac arrest - PEA v Asystole 06/16/21 - ROSC after 1 epi. Unclear cause but suspected aspiration  8/14  trop flat. BP ok  P: Tele Await echo report   Hx of UTI NOS 06/06/21 prior to admit: Rx Cipro by PCP  COVID-19 06/22/2021 admit diagnosis - Concern for aspiration 06/16/21  P:   DC paxlovid 06/16/21 Solumedrol for covid Unasyn for possible aspiration Check PCT   Baseline prio to and at admit:  -  CKD baseline creat 0.'86mg'$ % - 1.'0mg'$ % in 2022 - Hx of AKI hospitalization June 2022 - Pk creat 1.'7mg'$ % June 2022  Present on admission  -  AKI creat 1.'3mg'$ % on 06/1121 - due to covid 19  06/17/2021- worse post arrest to 2.'46mg'$ % and in  acuterenal failure despite fluid bolus. Cocnern is irreversible as he likely has RAS  P:  Reduce hydration Try lasix   Lactic acidosis. Mild , - onset 8/13  06/17/2021 - persists 8/14  Plan Rehceck 8/15  At risk electrolyte imbalance  P: Replete and monitor      Hx of AVMs at baseline prior to admit Severe protein calorie malnutrition - documented June 2022 (alb 2.9-3) Currently at admit: worsening failure to thrive   8/13: new coffee ground OG returns -> resolved 06/17/21 but hgb slow downdrift. No TF yet    P:   TF start PPI  BID NPO Hold off lovenox SQ -> consider restart 06/18/21 DAPT restart 06/17/21 due to severe PAD (stop if concernf or bleed increases)    Mild anemia at baseline prior to admit - 12gm%  06/17/2021 - no active bleeding. Anemia worse 9.7gm% due to acute illness   P:  - Monitor OG returns and hgb drift - PRBC for hgb </= 6.9gm%    - exceptions are   -  if ACS susepcted/confirmed then transfuse for hgb </= 8.0gm%,  or    -  active bleeding with hemodynamic instability, then transfuse regardless of hemoglobin value   At at all times try to transfuse 1 unit prbc as possible with exception of active hemorrhage    At risk thrombocytopenia At high risk DVT/PE due to covid  P Lovenox DVT proph on hold Check duplex LE    At risk hyperglycemia   P:   ssi  MSK/DERM Baseline prior to and at admit   - failure to thrive with physical deconditioning and cachexia (documented July 2022)  - outpatient palliative care  - recurrent falls (non compliance with walker) - SNF [facility]) June 2022 -> 05/26/21 -> then at home - Severe Protein Calorie Malnutrition (documented June 2022 dc note) - full code -> made DNR 06/17/21  Plan  - anticipates challenges liberating from ventilator and recovery   Best Practice (right click and  "Reselect all SmartList Selections" daily)   Diet/type: NPO . TF consult requested DVT prophylaxis: on hold GI prophylaxis: PPI Lines: N/A Foley:  N/A Code Status:  full code Last date of multidisciplinary goals of care discussion [son Todd dPOA) -  updated at bedsidde in ICU 06/16/21 - full code. Explained to him that survival chances are limited. He is processing.    -Interdisciplinary goals of care meeting done on 06/17/2021 with son [name] and also nurse Morrie Sheldon at the bedside.  Poor prognosis discussed.  Underlying medical issues complicate his recovery as well.  Based on this DNR elected.  We will do a time-limited care of ICU full care over the next day to few days to several days and see how his course evolves.  If he is declining or if prognosis looks bleak then have  a conversation about comfort care.  The son fully understands this.  Definitely no LTAC.  Definitely no tracheostomy.  Definitely no CRRT or dialysis.     ATTESTATION & SIGNATURE   The patient JAYKOB GADISON is critically ill with multiple organ systems failure and requires high complexity decision making for assessment and support, frequent evaluation and titration of therapies, application of advanced monitoring technologies and extensive interpretation of multiple databases.   Critical Care Time devoted to patient care services described in this note is  45  Minutes. This time reflects time of care of this signee Dr Brand Males. This critical care time does not reflect procedure time, or teaching time or supervisory time of PA/NP/Med student/Med Resident etc but could involve care discussion time     Dr. Brand Males, M.D., [facility].C.P Pulmonary and Critical Care Medicine Staff Physician [place] [facility] Pager: [phone], If no answer or between  15:00h - 7:00h: call [phone]  06/17/2021 12:44 PM    LABS    PULMONARY Recent Labs  Lab  06/16/21 0638 06/16/21 0900  PHART 7.077* 7.266*  PCO2ART 63.9* 47.9  PO2ART 159* 474*  HCO3 18.5* 21.1  O2SAT 97.0 99.3    CBC Recent Labs  Lab 06/16/21 0438 06/16/21 0626 06/17/21 0209  HGB 11.5* 10.6* 9.7*  HCT 35.5* 34.7* 30.6*  WBC 16.0* 20.0* 15.8*  PLT 344 344 224    COAGULATION Recent Labs  Lab 06/16/21 0626  INR 1.2    CARDIAC  No results for input(s): TROPONINI in the last 168 hours. No results for input(s): PROBNP in the last 168 hours.   CHEMISTRY Recent Labs  Lab 06/15/21 0415 06/16/21 0438 06/16/21 0626 06/16/21 1839 06/17/21 0209  NA 136 137 137 138 138  K 3.6 3.9 4.0 4.5 4.0  CL 100 102 102 105 107  CO2 24 25 20* 24 23  GLUCOSE 140* 150* 257* 193* 221*  BUN 43* 60* 60* 64* 69*  CREATININE 1.85* 1.97* 2.30* 2.29* 2.46*  CALCIUM 8.6* 8.3* 7.9* 7.5* 7.6*  MG 2.2  --  3.0*  --   --   PHOS 4.7*  --   --   --   --    Estimated Creatinine Clearance: 20.2 mL/min (A) (by C-G formula based on SCr of 2.46 mg/dL (H)).   LIVER Recent Labs  Lab 06/11/2021 1300 06/15/21 0415 06/16/21 0438 06/16/21 0626 06/17/21 0209  AST '16 16 15 '$ 80* 41  ALT '10 9 9 '$ 42 34  ALKPHOS 59 51 48 49 39  BILITOT 0.6 0.5 0.4 0.4 0.4  PROT 7.9 7.0 7.1 6.4* 6.1*  ALBUMIN 3.2* 2.9* 2.9* 2.5* 2.4*  INR  --   --   --  1.2  --      INFECTIOUS Recent Labs  Lab 07/01/2021 1608 06/15/21 0415 06/16/21 0626 06/16/21 1839 06/16/21 2148 06/17/21 0209  LATICACIDVEN  --   --    < > 2.8* 3.4* 3.0*  PROCALCITON <0.10 <0.10  --   --   --   --    < > = values in this interval not displayed.     ENDOCRINE CBG (last 3)  Recent Labs    06/16/21 2015 06/16/21 2352 06/17/21 0358  GLUCAP 190* 220* 183*         IMAGING x48h  - image(s) personally visualized  -   highlighted in bold DG CHEST PORT 1 VIEW  Result Date: 06/17/2021 CLINICAL  DATA:  80 year old male COVID-19.  Intubated. EXAM: PORTABLE CHEST 1 VIEW COMPARISON:  Portable chest 06/16/2021 and earlier.  FINDINGS: Portable AP semi upright view at 0517 hours. Endotracheal tube tip between the clavicles and carina. Enteric tube terminates in the distal stomach. Pulmonary hyperinflation. Prior sternotomy. Stable cardiac size and mediastinal contours. Possible small right pleural effusion is stable. Patchy interstitial and streaky bilateral pulmonary opacity is increased since 07/02/2021, stable since yesterday. No pneumothorax. No pulmonary edema. Negative visible bowel gas. IMPRESSION: 1.  Stable lines and tubes. 2. Bilateral COVID-19 pneumonia suspected and stable since yesterday. 3. Underlying hyperinflation. Possible small right pleural effusion. 4. No new cardiopulmonary abnormality. Electronically Signed   By: Genevie Ann M.D.   On: 06/17/2021 08:07   DG Chest Port 1 View  Result Date: 06/16/2021 CLINICAL DATA:  Cardiac arrest EXAM: PORTABLE CHEST 1 VIEW COMPARISON:  06/17/2021 FINDINGS: Endotracheal tube 1.5 cm from carina. Sternal wires overlie normal cardiac silhouette. Mild central pulmonary edema pattern increased from prior. No pneumothorax. Lungs well expanded. IMPRESSION: 1. Endotracheal tube 1.5 cm from carina. 2. Lungs well expanded. 3. New central mild pulmonary edema pattern. Electronically Signed   By: Suzy Bouchard M.D.   On: 06/16/2021 06:50   DG Abd Portable 1V  Result Date: 06/16/2021 CLINICAL DATA:  80 year old male enteric tube placement. Status post cardiac arrest. EXAM: PORTABLE ABDOMEN - 1 VIEW COMPARISON:  Portable chest at 0615 hours today. FINDINGS: Portable AP semi upright view at 0734 hours. Enteric tube placed into the stomach, tip at the gastric antrum. Stable lung bases and mediastinal contours. Mildly increased small bowel gas in the visible abdomen. There is gas and stool in the visible large bowel. Right upper quadrant small round 5 mm gallstone versus nephrolithiasis. Aortoiliac calcified atherosclerosis. No acute osseous abnormality identified. IMPRESSION: 1. Enteric tube  placed into the stomach, tip at the gastric antrum. 2. Possible mild ileus. 3. Round right upper quadrant 5 mm calculus might be gallstone versus nephrolithiasis. Electronically Signed   By: Genevie Ann M.D.   On: 06/16/2021 07:54   ECHOCARDIOGRAM COMPLETE  Result Date: 06/16/2021    ECHOCARDIOGRAM REPORT   Patient Name:   Kayston JOSEN AMENTA Date of Exam: 06/16/2021 Medical Rec #:  [id number]       Height:       67.0 in Accession #:    [id number]      Weight:       129.4 lb Date of Birth:  12-28-1940        BSA:          1.681 m Patient Age:    34 years        BP:           123/58 mmHg Patient Gender: M               HR:           69 bpm. Exam Location:  Inpatient Procedure: 2D Echo, Cardiac Doppler, Color Doppler and Intracardiac            Opacification Agent Indications:     Cardiac arrest I46.9  History:         Patient has prior history of Echocardiogram examinations, most                  recent 04/10/2021. CHF, CAD, Carotid Disease, PAD and Stroke;                  Risk Factors:Hypertension and Dyslipidemia.  COVID 19. Chronic                  recurrent falls, acute hypoxic respiratory failure, failure to                  thrive.  Sonographer:     Darlina Sicilian RDCS Referring Phys:  [place] Diagnosing Phys: Eleonore Chiquito MD  Sonographer Comments: Echo performed with patient supine and on artificial respirator. IMPRESSIONS  1. Left ventricular ejection fraction, by estimation, is 60 to 65%. The left ventricle has normal function. The left ventricle has no regional wall motion abnormalities. There is mild asymmetric left ventricular hypertrophy of the basal-septal segment. Left ventricular diastolic parameters are consistent with Grade II diastolic dysfunction (pseudonormalization). Elevated left ventricular end-diastolic pressure.  2. Right ventricular systolic function is normal. The right ventricular size is normal.  3. The mitral valve is grossly normal. No evidence of mitral valve regurgitation. No  evidence of mitral stenosis.  4. The aortic valve is tricuspid. Aortic valve regurgitation is not visualized. No aortic stenosis is present. Comparison(s): Changes from prior study are noted. EF remains normal. G2DD now present. FINDINGS  Left Ventricle: Left ventricular ejection fraction, by estimation, is 60 to 65%. The left ventricle has normal function. The left ventricle has no regional wall motion abnormalities. Definity contrast agent was given IV to delineate the left ventricular  endocardial borders. The left ventricular internal cavity size was normal in size. There is mild asymmetric left ventricular hypertrophy of the basal-septal segment. Left ventricular diastolic parameters are consistent with Grade II diastolic dysfunction (pseudonormalization). Elevated left ventricular end-diastolic pressure. The E/e' is 17.7. Right Ventricle: The right ventricular size is normal. No increase in right ventricular wall thickness. Right ventricular systolic function is normal. Left Atrium: Left atrial size was normal in size. Right Atrium: Right atrial size was normal in size. Pericardium: There is no evidence of pericardial effusion. Mitral Valve: The mitral valve is grossly normal. No evidence of mitral valve regurgitation. No evidence of mitral valve stenosis. Tricuspid Valve: The tricuspid valve is grossly normal. Tricuspid valve regurgitation is trivial. No evidence of tricuspid stenosis. Aortic Valve: The aortic valve is tricuspid. Aortic valve regurgitation is not visualized. No aortic stenosis is present. Pulmonic Valve: The pulmonic valve was grossly normal. Pulmonic valve regurgitation is not visualized. No evidence of pulmonic stenosis. Aorta: The aortic root and ascending aorta are structurally normal, with no evidence of dilitation. Venous: IVC assessment for right atrial pressure unable to be performed due to mechanical ventilation. IAS/Shunts: The atrial septum is grossly normal.  LEFT VENTRICLE PLAX  2D LVIDd:         4.80 cm      Diastology LVIDs:         3.90 cm      LV e' medial:    5.11 cm/s LV PW:         1.10 cm      LV E/e' medial:  17.6 LV IVS:        1.40 cm      LV e' lateral:   4.87 cm/s LVOT diam:     2.10 cm      LV E/e' lateral: 18.5 LV SV:         71 LV SV Index:   42 LVOT Area:     3.46 cm  LV Volumes (MOD) LV vol d, MOD A2C: 126.0 ml LV vol d, MOD A4C: 104.0 ml LV vol s, MOD A2C:  39.1 ml LV vol s, MOD A4C: 29.5 ml LV SV MOD A2C:     86.9 ml LV SV MOD A4C:     104.0 ml LV SV MOD BP:      85.4 ml LEFT ATRIUM             Index LA diam:        3.80 cm 2.26 cm/m LA Vol (A2C):   54.2 ml 32.25 ml/m LA Vol (A4C):   44.4 ml 26.42 ml/m LA Biplane Vol: 52.7 ml 31.36 ml/m  AORTIC VALVE LVOT Vmax:   80.20 cm/s LVOT Vmean:  58.900 cm/s LVOT VTI:    0.206 m  AORTA Ao Root diam: 3.30 cm Ao Asc diam:  3.30 cm MITRAL VALVE               TRICUSPID VALVE MV Area (PHT): 5.93 cm    TR Peak grad:   44.1 mmHg MV Decel Time: 128 msec    TR Vmax:        332.00 cm/s MV E velocity: 89.95 cm/s MV A velocity: 62.95 cm/s  SHUNTS MV E/A ratio:  1.43        Systemic VTI:  0.21 m                            Systemic Diam: 2.10 cm Eleonore Chiquito MD Electronically signed by Eleonore Chiquito MD Signature Date/Time: 06/16/2021/11:49:51 AM    Final (Updated)    CT HEAD CODE STROKE WO CONTRAST  Addendum Date: 06/16/2021   ADDENDUM REPORT: 06/16/2021 10:31 ADDENDUM: Study discussed by telephone with Dr. Brand Males on 06/16/2021 at 1019 hours. Electronically Signed   By: Genevie Ann M.D.   On: 06/16/2021 10:31   Result Date: 06/16/2021 CLINICAL DATA:  Code stroke. 80 year old male status post cardiac arrest. Neurologic deficit. EXAM: CT HEAD WITHOUT CONTRAST TECHNIQUE: Contiguous axial images were obtained from the base of the skull through the vertex without intravenous contrast. COMPARISON:  Head CT 04/09/2021. FINDINGS: Brain: Stable cerebral volume compared to June. No midline shift, ventriculomegaly, mass effect, evidence of  mass lesion, intracranial hemorrhage or evidence of cortically based acute infarction. Stable small chronic area of cortical encephalomalacia in the right parietal lobe best seen on coronal image 58. Stable patchy and confluent bilateral cerebral white matter hypodensity, asymmetric involvement of the right thalamus. No cerebral edema is evident. Vascular: Calcified atherosclerosis at the skull base. No suspicious intracranial vascular hyperdensity. Skull: No acute osseous abnormality identified. Congenital incomplete ossification of the posterior C1 ring. Sinuses/Orbits: Maxillary and sphenoid sinus fluid levels. Left frontal sinus osteoma appears stable and inconsequential. Tympanic cavities and mastoids remain clear. Other: Intubated. Only trace retained secretions in the visible pharynx. No acute orbit or scalp soft tissue finding. ASPECTS [facility] Stroke Program Early CT Score) Total score (0-10 with 10 being normal): 10 IMPRESSION: 1. Stable non contrast CT appearance of the brain since June. No acute cortically based infarct or acute intracranial hemorrhage identified. ASPECTS 10. 2. Intubated with some fluid in the paranasal sinuses. Electronically Signed: By: Genevie Ann M.D. On: 06/16/2021 10:16

## 2021-06-17 NOTE — Progress Notes (Signed)
Brief Nutrition Note  Consult received for enteral/tube feeding initiation and management.  Adult Enteral Nutrition Protocol initiated. Full assessment to follow.  Admitting Dx:  Cough [R05.9] Hypokalemia [E87.6] Acute respiratory failure with hypoxia ([place]) [J96.01] AKI (acute kidney injury) ([place]) [N17.9] Pneumonia due to COVID-19 virus [U07.1, J12.82]  Body mass index is 20.27 kg/m. Pt meets criteria for normal weight based on current BMI.  Labs: Recent Labs  Lab 06/15/21 0415 06/16/21 0438 06/16/21 0626 06/16/21 1839 06/17/21 0209  NA 136   < > 137 138 138  K 3.6   < > 4.0 4.5 4.0  CL 100   < > 102 105 107  CO2 24   < > 20* 24 23  BUN 43*   < > 60* 64* 69*  CREATININE 1.85*   < > 2.30* 2.29* 2.46*  CALCIUM 8.6*   < > 7.9* 7.5* 7.6*  MG 2.2  --  3.0*  --   --   PHOS 4.7*  --   --   --   --   GLUCOSE 140*   < > 257* 193* 221*   < > = values in this interval not displayed.    Ranell Patrick, RD, LDN Clinical Dietitian Pager on [place]

## 2021-06-17 NOTE — Procedures (Signed)
Patient Name: Earl Castillo  MRN: [id number]  Epilepsy Attending: Lora Havens  Referring Physician/Provider: Dr Brand Males Date: 06/17/2021 Duration: 2 hour 4 minutes  Patient history: 80yo M s/p cardiac arrest, worsening right sided weakness. EEG to evaluate for seizure  Level of alertness:  lethargic   AEDs during EEG study: None  Technical aspects: This EEG was obtained using a 10 lead EEG system positioned circumferentially without any parasagittal coverage (rapid EEG). Computer selected EEG is reviewed as  well as background features and all clinically significant events.  Description: EEG showed continuous generalized 3 to 6 Hz theta-delta slowing. There is also low amplitude 2-'3hz'$  delta slowing.  Hyperventilation and photic stimulation were not performed.     ABNORMALITY - Continuous slow, generalized and lateralized left hemisphere  IMPRESSION: This  limited ceribell study is suggestive of cortical dysfunction arising from left hemisphere likely secondary to underlying structural abnormality. There is also moderate to severe diffuse encephalopathy, nonspecific etiology. No seizures or epileptiform discharges were seen throughout the recording.  If suspicion for ictal-interictal activity remains a concern, a conventional eeg can be considered  Lannah Koike Barbra Sarks

## 2021-06-18 ENCOUNTER — Other Ambulatory Visit: Payer: Self-pay

## 2021-06-18 ENCOUNTER — Inpatient Hospital Stay (HOSPITAL_COMMUNITY): Payer: Medicare Other

## 2021-06-18 DIAGNOSIS — J1282 Pneumonia due to coronavirus disease 2019: Secondary | ICD-10-CM | POA: Diagnosis not present

## 2021-06-18 DIAGNOSIS — U071 COVID-19: Secondary | ICD-10-CM | POA: Diagnosis not present

## 2021-06-18 LAB — CBC WITH DIFFERENTIAL/PLATELET
Abs Immature Granulocytes: 0.37 10*3/uL — ABNORMAL HIGH (ref 0.00–0.07)
Basophils Absolute: 0 10*3/uL (ref 0.0–0.1)
Basophils Relative: 0 %
Eosinophils Absolute: 0 10*3/uL (ref 0.0–0.5)
Eosinophils Relative: 0 %
HCT: 31.1 % — ABNORMAL LOW (ref 39.0–52.0)
Hemoglobin: 10.1 g/dL — ABNORMAL LOW (ref 13.0–17.0)
Immature Granulocytes: 2 %
Lymphocytes Relative: 1 %
Lymphs Abs: 0.3 10*3/uL — ABNORMAL LOW (ref 0.7–4.0)
MCH: 31.6 pg (ref 26.0–34.0)
MCHC: 32.5 g/dL (ref 30.0–36.0)
MCV: 97.2 fL (ref 80.0–100.0)
Monocytes Absolute: 0.7 10*3/uL (ref 0.1–1.0)
Monocytes Relative: 4 %
Neutro Abs: 19 10*3/uL — ABNORMAL HIGH (ref 1.7–7.7)
Neutrophils Relative %: 93 %
Platelets: 265 10*3/uL (ref 150–400)
RBC: 3.2 MIL/uL — ABNORMAL LOW (ref 4.22–5.81)
RDW: 13.3 % (ref 11.5–15.5)
WBC: 20.5 10*3/uL — ABNORMAL HIGH (ref 4.0–10.5)
nRBC: 0 % (ref 0.0–0.2)

## 2021-06-18 LAB — PHOSPHORUS
Phosphorus: 3.6 mg/dL (ref 2.5–4.6)
Phosphorus: 2.8 mg/dL (ref 2.5–4.6)

## 2021-06-18 LAB — COMPREHENSIVE METABOLIC PANEL
ALT: 27 U/L (ref 0–44)
AST: 23 U/L (ref 15–41)
Albumin: 2.6 g/dL — ABNORMAL LOW (ref 3.5–5.0)
Alkaline Phosphatase: 45 U/L (ref 38–126)
Anion gap: 11 (ref 5–15)
BUN: 79 mg/dL — ABNORMAL HIGH (ref 8–23)
CO2: 23 mmol/L (ref 22–32)
Calcium: 8 mg/dL — ABNORMAL LOW (ref 8.9–10.3)
Chloride: 105 mmol/L (ref 98–111)
Creatinine, Ser: 2.84 mg/dL — ABNORMAL HIGH (ref 0.61–1.24)
GFR, Estimated: 22 mL/min — ABNORMAL LOW (ref >60)
Glucose, Bld: 253 mg/dL — ABNORMAL HIGH (ref 70–99)
Potassium: 3.6 mmol/L (ref 3.5–5.1)
Sodium: 139 mmol/L (ref 135–145)
Total Bilirubin: 0.6 mg/dL (ref 0.3–1.2)
Total Protein: 7 g/dL (ref 6.5–8.1)

## 2021-06-18 LAB — LACTIC ACID, PLASMA: Lactic Acid, Venous: 1.5 mmol/L (ref 0.5–1.9)

## 2021-06-18 LAB — GLUCOSE, CAPILLARY
Glucose-Capillary: 254 mg/dL — ABNORMAL HIGH (ref 70–99)
Glucose-Capillary: 223 mg/dL — ABNORMAL HIGH (ref 70–99)
Glucose-Capillary: 271 mg/dL — ABNORMAL HIGH (ref 70–99)
Glucose-Capillary: 274 mg/dL — ABNORMAL HIGH (ref 70–99)
Glucose-Capillary: 276 mg/dL — ABNORMAL HIGH (ref 70–99)
Glucose-Capillary: 214 mg/dL — ABNORMAL HIGH (ref 70–99)

## 2021-06-18 LAB — TROPONIN I (HIGH SENSITIVITY): Troponin I (High Sensitivity): 59 ng/L — ABNORMAL HIGH (ref <18)

## 2021-06-18 LAB — MAGNESIUM
Magnesium: 2.9 mg/dL — ABNORMAL HIGH (ref 1.7–2.4)
Magnesium: 3 mg/dL — ABNORMAL HIGH (ref 1.7–2.4)

## 2021-06-18 MED ORDER — DILTIAZEM HCL-DEXTROSE 125-5 MG/125ML-% IV SOLN (PREMIX)
5.0000 mg/h | INTRAVENOUS | Status: DC
Start: 2021-06-18 — End: 2021-06-18
  Administered 2021-06-18: 5 mg/h via INTRAVENOUS
  Filled 2021-06-18: qty 125

## 2021-06-18 MED ORDER — HEPARIN SODIUM (PORCINE) 5000 UNIT/ML IJ SOLN
5000.0000 [IU] | Freq: Three times a day (TID) | INTRAMUSCULAR | Status: DC
  Administered 2021-06-18 – 2021-06-20 (×7): 5000 [IU] via SUBCUTANEOUS
  Filled 2021-06-18 (×7): qty 1

## 2021-06-18 MED ORDER — PIVOT 1.5 CAL PO LIQD
1000.0000 mL | ORAL | Status: DC
  Administered 2021-06-18 – 2021-06-19 (×2): 1000 mL
  Filled 2021-06-18 (×2): qty 1000

## 2021-06-18 MED ORDER — FENTANYL CITRATE (PF) 100 MCG/2ML IJ SOLN
25.0000 ug | INTRAMUSCULAR | Status: DC | PRN
  Administered 2021-06-18 (×2): 100 ug via INTRAVENOUS
  Administered 2021-06-19: 50 ug via INTRAVENOUS
  Administered 2021-06-19: 75 ug via INTRAVENOUS
  Administered 2021-06-19 (×2): 50 ug via INTRAVENOUS
  Administered 2021-06-20 (×4): 100 ug via INTRAVENOUS
  Filled 2021-06-18 (×10): qty 2

## 2021-06-18 MED ORDER — DILTIAZEM HCL 60 MG PO TABS
60.0000 mg | ORAL_TABLET | Freq: Four times a day (QID) | ORAL | Status: DC
  Administered 2021-06-18 – 2021-06-19 (×4): 60 mg
  Filled 2021-06-18 (×4): qty 1

## 2021-06-18 MED ORDER — PROSOURCE TF PO LIQD
45.0000 mL | Freq: Every day | ORAL | Status: DC
  Administered 2021-06-18 – 2021-06-20 (×3): 45 mL
  Filled 2021-06-18 (×3): qty 45

## 2021-06-18 MED ORDER — INSULIN GLARGINE-YFGN 100 UNIT/ML ~~LOC~~ SOLN
5.0000 [IU] | Freq: Two times a day (BID) | SUBCUTANEOUS | Status: DC
  Administered 2021-06-18 – 2021-06-19 (×3): 5 [IU] via SUBCUTANEOUS
  Filled 2021-06-18 (×3): qty 0.05

## 2021-06-18 NOTE — Progress Notes (Signed)
Initial Nutrition Assessment  DOCUMENTATION CODES:   Not applicable  INTERVENTION:  - will adjust TF regimen: Pivot 1.5 @ 50 with 45 ml Prosource TF once/day.  - this will provide 1840 kcal, 123 grams protein, and 900 ml free water.  - free water flush to continue to be per CCM.    NUTRITION DIAGNOSIS:   Increased nutrient needs related to acute illness, catabolic illness ([id number] infection) as evidenced by estimated needs.  GOAL:   Patient will meet greater than or equal to 90% of their needs  MONITOR:   Vent status, TF tolerance, Labs, Weight trends  REASON FOR ASSESSMENT:   Ventilator, Consult Enteral/tube feeding initiation and management  ASSESSMENT:   80 year old male with medical history of HLD, HTN, CAD, hemorrhoids, GERD, PAD, vitamin B12 deficiency, iron deficiency anemia, osteoarthritis, stroke, and cancer. He presented to the ED on 8/11 with FTT, bedbound status mainly, and increased frequency of falls. He was diagnosed with AKI and was found to be COVID-19 positive. In the early hours of 8/13 he went into cardiac arrest; ROSC with 1 dose epi and <6 minutes CPR. He vomited at the time of intubation. CT head showed no acute findings, but showed remote infarcts and advanced intracranial atherosclerotic disease.  Patient was started on TF per protocol yesterday. He was discussed in rounds this AM.   Patient remains intubated with OGT in place (gastric) and is receiving Pivot 1.5 @ 40 ml/hr with 30 ml free water every 4 hours. This regimen is providing 1440 kcal, 90 grams protein, and 900 ml free water.   Patient is DNR. He is currently in supine position.   Weight today is 124 lb and weight has been stable over the past 5 weeks. Weight on 5/23 was 133 lb. This indicates 9 lb weight loss (6.7% body weight) in the past 3 months; not significant for time frame.   Mild pitting edema to BLE documented in the edema section of flow sheet. He is noted to be +3.5 L since  admission.   Per notes: - acute respiratory failure with severe respiratory acidosis - concern for aspiration at the time of intubation - acute anoxic encephalopathy - severe L carotid artery stenosis dx in 04/2021 pending complex surgery by VVS - s/p cardiac arrest - new onset afib with RVR (8/15 AM) - COVID-19 positive (as of admission on 8/11) - AKI on CKD - worsening FTT - mild anemia   Patient is currently intubated on ventilator support MV: 12 L/min Temp (24hrs), Avg:97.2 F (36.2 C), Min:96.5 F (35.8 C), Max:97.8 F (36.6 C) Propofol: none  Labs reviewed; CBGs: 254 and 223 mg/dl, BUN: 79 mg/dl, creatinine: 2.84 mg/dl, Ca: 8 mg/dl, Mg: 2.9 mg/dl, GFR: 22. K and Phos WDL.  Medications reviewed; 100 mg colace BID, 20 mg IV pepcid/day, sliding scale novolog, 5 units semglee BID, 80 mg solu-medrol/day, 17 g miralax/day, 40 mg IV protonix BID.      NUTRITION - FOCUSED PHYSICAL EXAM:  Unable to complete at this time.   Diet Order:   Diet Order             Diet NPO time specified  Diet effective now                   EDUCATION NEEDS:   No education needs have been identified at this time  Skin:  Skin Assessment: Reviewed RN Assessment  Last BM:  8/13  Height:   Ht Readings from Last 1 Encounters:  06/16/21 '5\' 7"'$  (1.702 m)    Weight:   Wt Readings from Last 1 Encounters:  06/18/21 56.5 kg     Estimated Nutritional Needs:  Kcal:  1695-1920 kcal (30-34 kcal/kg) Protein:  115-130 grams Fluid:  >/= 2 L/day      Jarome Matin, MS, RD, LDN, CNSC Inpatient Clinical Dietitian RD pager # available in AMION  After hours/weekend pager # available in [facility]

## 2021-06-18 NOTE — Progress Notes (Signed)
NAME:  Earl Castillo, MRN:  [id number], DOB:  17-Jan-1941, LOS: 4 ADMISSION DATE:  06/21/2021, CONSULTATION DATE:  06/16/21 --  REFERRING MD:  Triad MD, CHIEF COMPLAINT:  Covid and cardiac arrest   Brief  80 year old male with multiple medical problems and multiple admissions who presented  06/28/2021 due to continued failure to thrive, bedbound increased frequency of falls with diagnosis of acute kidney injury, treatment of COVID-19 with Paxlovid.   On early hours of 06/16/2021 bradycardia followed by cardiac arrest.  ROSC with 1 round of epinephrine and less than 6 minutes of CPR.  Per documentation vomit noted at the time of intubation.  Initial ABG with respiratory acidosis.  Post cardiac arrest in the ICU not fully responsive but on fentanyl infusion.  Left pupil dilated.  Right upper extremity is weak.  Son at the bedside feels is a new finding. CT head without acute  stroke/acute findings but remote right thalamic and right' \\parieto'$ -occipital cortical infarcts. Advanced intracranial atherosclerotic disease.  Pertinent  Medical History  CAD CHF HLD HTN Anemia PAD Prior stroke with carotid stenosis  COVID - 19 Cardiac arrest Failure to thrive  Significant Hospital Events:  06/26/2021 - admit/. Urine leg/strep neg. Covid PCR positive 06/16/21 - cardiac arrest , epi x 1, ROSC -> ICU transfer.  PCCM takeover, ETT. ECHo -LVEF 65%.  Pseudonormalization with grade 2 diastolic dysfunction and elevated LVEDP.  Normal right ventricular function D-dimer went up from 1.2 to 3.94 8/14 -in acute renal failure despite fluids  On the ventilator FiO2 30%.  Trop pk 125.  Is comatose with just as needed sedation.  No posturing.  Right upper extremity remains weak.  CT head without any acute stroke.  Not on pressors.  Spot cerebellar EEG done -> Per Dr. Hortense Ramal left greater than right slowing but no seizures.  EEG can be stopped.  Son [name] DPOA at the bedside. 8/15 New onset A-fib RVR started on IV Amio and IV  Cardizem overnight   Interim History / Subjective:  Developed A-fib RVR overnight  Urine output remains low ~681m over the last 24hrs  Objective   Blood pressure (!) 164/32, pulse 87, temperature (!) 96.5 F (35.8 C), temperature source Axillary, resp. rate (!) 21, height '5\' 7"'$  (1.702 m), weight 56.5 kg, SpO2 97 %.    Vent Mode: PRVC FiO2 (%):  [30 %] 30 % Set Rate:  [22 bmp] 22 bmp Vt Set:  [490 mL] 490 mL PEEP:  [5 cmH20] 5 cmH20 Plateau Pressure:  [16 cmH20-18 cmH20] 18 cmH20   Intake/Output Summary (Last 24 hours) at 06/18/2021 [id number] data filed at 06/18/2021 [id number] per 24 hour  Intake 818.13 ml  Output 975 ml  Net -156.87 ml    Filed Weights   06/17/2021 2100 06/18/21 0400  Weight: 58.7 kg 56.5 kg    Examination: General: Acute on chronically ill appearing deconditioned elderly male lying in bed on mechanical ventilation, in NAD HEENT: ETT, MM pink/moist, PERRL,  Neuro: Attempts to open eyes to physical stimuli, seen with purposeful movement to left upper extremity  CV: s1s2 regular rate and rhythm, no murmur, rubs, or gallops,  PULM:  Diminished bilaterally, tolerating vent, no increased work of breathing  GI: soft, bowel sounds active in all 4 quadrants, non-tender, non-distended, tolerating TF Extremities: warm/dry, no edema  Skin: no rashes or lesions  Resolved Hospital Problem list   Lactic acidosis  Assessment & Plan:  Acute resp failure with severe resp acidosis  -In the  setting of cardiac arrest  Concern for aspiration event at time of intubation  Smoker heavy at baseline with emphysema  P: Continue ventilator support with lung protective strategies  Wean PEEP and FiO2 for sats greater than 90%. Head of bed elevated 30 degrees. Plateau pressures less than 30 cm H20.  Follow intermittent chest x-ray and ABG.   SAT/SBT as tolerated, mentation preclude extubation  Ensure adequate pulmonary hygiene  Follow cultures  VAP bundle in place  PAD  protocol Continue Unasyn   Acute anoxic encephalopathy  -no acute findings on CT head 06/16/2021) Hx of strokes, on DAPT Hx of Severe > 90% Left carotid artery stenosis June 2022  - complex surgery pending by VVS P:   Neuro consulted this admission  Maintain neuro protective measures; goal for eurothermia, euglycemia, eunatermia, normoxia, and PCO2 goal of 35-40 Nutrition and bowel regiment  Seizure precautions  AEDs per neurology  Aspirations precautions  Minimize sedation   Cardiac arrest  -bradycardia followed by cardiac arrest.  ROSC with 1 round of epinephrine and less than 6 minutes of CPR. Severe  PAD and carotid stenosis Hx of HTN and HLD - On DAPT, Imdur, Lopressor, Procardia,  Hx of CAD and CHF  -EF 60-65% with no regional wall motion abnormality but grade II diastolic dysfunction  New onset A-fib RVR  -Developed early morning of 8/15 P:  Started on both IV Cardizem and Amio  Stop IV Cardizem in favor of PO Wean IV Amio  Restart home meds as able  Remains on DAPT Continuous telemetry Strict intake and output  Daily weight to assess volume status Daily assessment for need to diurese  Closely monitor renal function and electrolytes   COVID-19 06/07/2021 admit diagnosis -Procal < 0.10 x2 -Paxlovid d/c 8/13 P:   Consider discontinuation of steroids  Supportive care   AKI superimposed on CKD  -Baseline creat 0.'86mg'$ % - 1.'0mg'$ % in 2022 P:  Follow renal function  Monitor ]urine output Trend Bmet Avoid nephrotoxins Ensure adequate renal perfusion  Obtain urine lytes  Consider renal ultrasound   Severe protein calorie malnutrition  - documented June 2022 (alb 2.9-3) Worsening failure to thrive  P: Continue TF Protein supplementation    Mild anemia  P:  Transfuse per protocol Hgb goal > 7 Monitor for signs of bleeding on DAPT   Hyperglycemia P: SSI  CBG checks q4  Best Practice   Diet/type: NPO . TF consult requested DVT prophylaxis: on hold GI  prophylaxis: PPI Lines: N/A Foley:  N/A Code Status:  full code Last date of multidisciplinary goals of care discussion: Interdisciplinary goals of care meeting done on 06/17/2021 with son [name] and also nurse Morrie Sheldon at the bedside.  Poor prognosis discussed.  Underlying medical issues complicate his recovery as well.  Based on this DNR elected.  We will do a time-limited care of ICU full care over the next day to few days to several days and see how his course evolves.  If he is declining or if prognosis looks bleak then have a conversation about comfort care.  The son fully understands this.  Definitely no LTAC.  Definitely no tracheostomy.  Definitely no CRRT or dialysis.  Critical care:   Performed by: Roney Youtz D. Harris  Total critical care time: 38 minutes  Critical care time was exclusive of separately billable procedures and treating other patients.  Critical care was necessary to treat or prevent imminent or life-threatening deterioration.  Critical care was time spent personally by me on the  following activities: development of treatment plan with patient and/or surrogate as well as nursing, discussions with consultants, evaluation of patient's response to treatment, examination of patient, obtaining history from patient or surrogate, ordering and performing treatments and interventions, ordering and review of laboratory studies, ordering and review of radiographic studies, pulse oximetry and re-evaluation of patient's condition.  Layloni Fahrner D. [name], NP-C [facility] Personal contact information can be found on Amion  06/18/2021, 9:12 AM

## 2021-06-18 NOTE — TOC Initial Note (Signed)
Transition of Care [facility]) - Initial/Assessment Note    Patient Details  Name: Earl Castillo MRN: [id number] Date of Birth: 14-Feb-1941  [facility]) CM/SW Contact:    Leeroy Cha, RN Phone Number: 06/18/2021, 7:58 AM  Clinical Narrative:                 80 year old male with multiple medical problems including hyperlipidemia hypertension, severe peripheral arterial disease with status post stent and angioplasty to the left SFA in 2015, carotid artery disease with complex carotid lesion with cancellation of left carotid endarterectomy in July and August 2022 due to medical issues [angiogram in June 2022 showed 90% stenosis left side severely calcified], Hx of stroke on DAPT, I diastolic heart failure including admission in June 2022, lower GI bleeding in 2014 secondary to AVM , anemia smoker, emphysema on CT chest but?  On baseline treatment    . In June 2022 he had admission for for worsening chronic recurrent falls, acute hypoxic respiratory failure, failure to thrive and physical deconditioning and cachexia and acute kidney injury due to acute on chronic diastolic heart failure.  Discharged end of June 2022 to skilled nursing facility.  After that discharged home end of July 2022.  Left carotid and Dr. May cancel end of July 2022 in early August 2022 due to falls and confusion at home.  Treatment with ciprofloxacin for UTI as an outpatient on 06/06/2021.  And then diagnosis of COVID-19 on 06/12/2021 with prescription of Paxlovid with as outpatient. He is unvaccinated against COVID-19.     On 06/28/2021 brought in and admitted because of continued failure to thrive, bedbound increased frequency of falls with diagnosis of acute kidney injury, treatment of COVID-19 with Paxlovid. On  8/12 pm had c.o indigesttion but otherwise well. On early hours of 06/16/2021 bradycardia followed by cardiac arrest.  ROSC with 1 round of epinephrine and less than 6 minutes of CPR.  Per documentation vomit  noted at the time of intubation.  Initial ABG with respiratory acidosis.  Post cardiac arrest in the ICU not fully responsive but on fentanyl infusion.  Left pupil dilated.  Right upper extremity is weak.  Son at the bedside feels is a new finding. CT head without acute  stroke/acute findings but Remote right thalamic and right' \\parieto'$ -occipital cortical infarcts. Advanced intracranial atherosclerotic disease.  TOC PLAN OF CARE: Following for progression of care.    Barriers to Discharge: Continued Medical Work up   Patient Goals and CMS Choice        Expected Discharge Plan and Services     Discharge Planning Services: CM Consult   Living arrangements for the past 2 months: [place]                                      Prior Living Arrangements/Services Living arrangements for the past 2 months: [place]  with:: Spouse Patient language and need for interpreter reviewed:: Yes              Criminal Activity/Legal Involvement Pertinent to Current Situation/Hospitalization: No - Comment as needed  Activities of Daily Living Home Assistive Devices/Equipment: Eyeglasses, Environmental consultant (specify type), Shower chair with back, Cane (specify quad or straight) (front wheeled walker, single point cane) ADL Screening (condition at time of admission) Patient's cognitive ability adequate to safely complete daily activities?: No Is the patient deaf or have difficulty hearing?:  No Does the patient have difficulty seeing, even when wearing glasses/contacts?: Yes Does the patient have difficulty concentrating, remembering, or making decisions?: Yes Patient able to express need for assistance with ADLs?: Yes Does the patient have difficulty dressing or bathing?: Yes Independently performs ADLs?: No (secondary to weakness and stroke) Communication: Independent Dressing (OT): Needs assistance Is this a change from baseline?: Pre-admission baseline Grooming: Needs  assistance Is this a change from baseline?: Pre-admission baseline Feeding: Needs assistance Is this a change from baseline?: Pre-admission baseline Bathing: Needs assistance Is this a change from baseline?: Pre-admission baseline Toileting: Dependent Is this a change from baseline?: Change from baseline, expected to last >3days In/Out Bed: Dependent Is this a change from baseline?: Change from baseline, expected to last >3 days Walks in Home: Dependent Is this a change from baseline?: Change from baseline, expected to last >3 days Does the patient have difficulty walking or climbing stairs?: Yes Weakness of Legs: Both Weakness of Arms/Hands: Both  Permission Sought/Granted                  Emotional Assessment   Attitude/Demeanor/Rapport: Intubated (Following Commands or Not Following Commands) Affect (typically observed): Unable to Assess   Alcohol / Substance Use: Not Applicable Psych Involvement: No (comment)  Admission diagnosis:  Cough [R05.9] Hypokalemia [E87.6] Acute respiratory failure with hypoxia (HCC) [J96.01] AKI (acute kidney injury) ([place]) [N17.9] Pneumonia due to COVID-19 virus [U07.1, J12.82] Patient Active Problem List   Diagnosis Date Noted   DNR (do not resuscitate) 06/17/2021    Class: Diagnosis of   AKI (acute kidney injury) ([place])    Asystole ([place])    Pneumonia due to COVID-19 virus 07/03/2021   COVID-19 virus infection 06/12/2021   COPD with emphysema ([place]) 06/06/2021   Protein-calorie malnutrition, severe 04/11/2021   Acute CHF (congestive heart failure) ([place]) 04/09/2021   Acute respiratory failure with hypoxia ([place]) 04/09/2021   NAION (non-arteritic anterior ischemic optic neuropathy), left 12/28/2020   Choroidal nevus, right 11/30/2020   PAD (peripheral artery disease) ([place]) 08/09/2016   GI bleed 06/22/2014   Current smoker 05/24/2014   Claudication ([place]) 05/23/2014   Vitamin B12 deficiency 11/23/2013   Angiodysplasia of intestine -  small bowel  09/21/2013   PVD- Lt SFA PTA/stent 05/23/14 09/13/2013   Carotid artery disease ([place]) 09/13/2013   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 08/17/2013   Personal history of colonic polyps-adenoma 08/17/2012   NEPHROLITHIASIS, HX OF 09/10/2010   URTICARIA 08/22/2010   Coronary atherosclerosis 01/13/2009   Osteoarthritis 01/13/2009   CHICKENPOX, HX OF 01/13/2008   Hyperlipidemia 07/29/2007   Essential hypertension 07/29/2007   PCP:  [name], MD Pharmacy:   [id number] [place] [place]: [phone]: [phone] [place]Phone: [phone]: [phone]    Social Determinants of Health (SDOH) Interventions    Readmission Risk Interventions No flowsheet data found.

## 2021-06-19 DIAGNOSIS — U071 COVID-19: Secondary | ICD-10-CM | POA: Diagnosis not present

## 2021-06-19 DIAGNOSIS — J1282 Pneumonia due to coronavirus disease 2019: Secondary | ICD-10-CM | POA: Diagnosis not present

## 2021-06-19 LAB — CBC WITH DIFFERENTIAL/PLATELET
Abs Immature Granulocytes: 0.29 10*3/uL — ABNORMAL HIGH (ref 0.00–0.07)
Basophils Absolute: 0 10*3/uL (ref 0.0–0.1)
Basophils Relative: 0 %
Eosinophils Absolute: 0 10*3/uL (ref 0.0–0.5)
Eosinophils Relative: 0 %
HCT: 30.5 % — ABNORMAL LOW (ref 39.0–52.0)
Hemoglobin: 9.2 g/dL — ABNORMAL LOW (ref 13.0–17.0)
Immature Granulocytes: 2 %
Lymphocytes Relative: 2 %
Lymphs Abs: 0.3 10*3/uL — ABNORMAL LOW (ref 0.7–4.0)
MCH: 31.3 pg (ref 26.0–34.0)
MCHC: 30.2 g/dL (ref 30.0–36.0)
MCV: 103.7 fL — ABNORMAL HIGH (ref 80.0–100.0)
Monocytes Absolute: 0.8 10*3/uL (ref 0.1–1.0)
Monocytes Relative: 5 %
Neutro Abs: 14.5 10*3/uL — ABNORMAL HIGH (ref 1.7–7.7)
Neutrophils Relative %: 91 %
Platelets: 219 10*3/uL (ref 150–400)
RBC: 2.94 MIL/uL — ABNORMAL LOW (ref 4.22–5.81)
RDW: 13.8 % (ref 11.5–15.5)
WBC: 15.9 10*3/uL — ABNORMAL HIGH (ref 4.0–10.5)
nRBC: 0.6 % — ABNORMAL HIGH (ref 0.0–0.2)

## 2021-06-19 LAB — COMPREHENSIVE METABOLIC PANEL
ALT: 20 U/L (ref 0–44)
AST: 32 U/L (ref 15–41)
Albumin: 2.2 g/dL — ABNORMAL LOW (ref 3.5–5.0)
Alkaline Phosphatase: 41 U/L (ref 38–126)
Anion gap: 13 (ref 5–15)
BUN: 117 mg/dL — ABNORMAL HIGH (ref 8–23)
CO2: 22 mmol/L (ref 22–32)
Calcium: 7.8 mg/dL — ABNORMAL LOW (ref 8.9–10.3)
Chloride: 106 mmol/L (ref 98–111)
Creatinine, Ser: 3.19 mg/dL — ABNORMAL HIGH (ref 0.61–1.24)
GFR, Estimated: 19 mL/min — ABNORMAL LOW (ref >60)
Glucose, Bld: 240 mg/dL — ABNORMAL HIGH (ref 70–99)
Potassium: 4.5 mmol/L (ref 3.5–5.1)
Sodium: 141 mmol/L (ref 135–145)
Total Bilirubin: 0.6 mg/dL (ref 0.3–1.2)
Total Protein: 6.1 g/dL — ABNORMAL LOW (ref 6.5–8.1)

## 2021-06-19 LAB — GLUCOSE, CAPILLARY
Glucose-Capillary: 241 mg/dL — ABNORMAL HIGH (ref 70–99)
Glucose-Capillary: 230 mg/dL — ABNORMAL HIGH (ref 70–99)
Glucose-Capillary: 224 mg/dL — ABNORMAL HIGH (ref 70–99)
Glucose-Capillary: 257 mg/dL — ABNORMAL HIGH (ref 70–99)
Glucose-Capillary: 251 mg/dL — ABNORMAL HIGH (ref 70–99)

## 2021-06-19 LAB — PHOSPHORUS
Phosphorus: 2.8 mg/dL (ref 2.5–4.6)
Phosphorus: 4.4 mg/dL (ref 2.5–4.6)

## 2021-06-19 LAB — MAGNESIUM
Magnesium: 3 mg/dL — ABNORMAL HIGH (ref 1.7–2.4)
Magnesium: 3.2 mg/dL — ABNORMAL HIGH (ref 1.7–2.4)

## 2021-06-19 MED ORDER — ISOSORBIDE MONONITRATE ER 60 MG PO TB24: 30.0000 mg | ORAL_TABLET | Freq: Every day | ORAL | Status: DC

## 2021-06-19 MED ORDER — FUROSEMIDE 10 MG/ML IJ SOLN
60.0000 mg | Freq: Once | INTRAMUSCULAR | Status: AC
  Administered 2021-06-19: 60 mg via INTRAVENOUS
  Filled 2021-06-19: qty 6

## 2021-06-19 MED ORDER — ISOSORBIDE DINITRATE 5 MG PO TABS
15.0000 mg | ORAL_TABLET | Freq: Two times a day (BID) | ORAL | Status: DC
  Administered 2021-06-19 – 2021-06-20 (×3): 15 mg
  Filled 2021-06-19 (×3): qty 1

## 2021-06-19 MED ORDER — SODIUM CHLORIDE 0.9 % IV SOLN
3.0000 g | INTRAVENOUS | Status: DC
  Administered 2021-06-20: 3 g via INTRAVENOUS
  Filled 2021-06-19: qty 3

## 2021-06-19 MED ORDER — INSULIN GLARGINE-YFGN 100 UNIT/ML ~~LOC~~ SOLN
20.0000 [IU] | Freq: Every day | SUBCUTANEOUS | Status: DC
  Administered 2021-06-20: 20 [IU] via SUBCUTANEOUS
  Filled 2021-06-19: qty 0.2

## 2021-06-19 MED ORDER — DILTIAZEM HCL 60 MG PO TABS
90.0000 mg | ORAL_TABLET | Freq: Four times a day (QID) | ORAL | Status: DC
  Administered 2021-06-19 – 2021-06-20 (×4): 90 mg
  Filled 2021-06-19 (×4): qty 1

## 2021-06-19 MED ORDER — INSULIN ASPART 100 UNIT/ML IJ SOLN
0.0000 [IU] | INTRAMUSCULAR | Status: DC
Start: 2021-06-19 — End: 2021-06-20
  Administered 2021-06-19: 11 [IU] via SUBCUTANEOUS
  Administered 2021-06-19: 7 [IU] via SUBCUTANEOUS
  Administered 2021-06-19: 11 [IU] via SUBCUTANEOUS
  Administered 2021-06-20 (×3): 7 [IU] via SUBCUTANEOUS
  Administered 2021-06-20: 4 [IU] via SUBCUTANEOUS

## 2021-06-19 MED ORDER — FAMOTIDINE IN NACL 20-0.9 MG/50ML-% IV SOLN: 20.0000 mg | INTRAVENOUS | Status: DC

## 2021-06-19 MED ORDER — HYDRALAZINE HCL 20 MG/ML IJ SOLN
10.0000 mg | Freq: Four times a day (QID) | INTRAMUSCULAR | Status: DC | PRN
  Administered 2021-06-19 – 2021-06-20 (×2): 10 mg via INTRAVENOUS
  Filled 2021-06-19 (×2): qty 1

## 2021-06-19 MED ORDER — AMLODIPINE BESYLATE 10 MG PO TABS: 10.0000 mg | ORAL_TABLET | Freq: Every day | ORAL | Status: DC

## 2021-06-19 MED ORDER — INSULIN GLARGINE-YFGN 100 UNIT/ML ~~LOC~~ SOLN
10.0000 [IU] | Freq: Every day | SUBCUTANEOUS | Status: AC
  Administered 2021-06-19: 10 [IU] via SUBCUTANEOUS
  Filled 2021-06-19: qty 0.1

## 2021-06-19 NOTE — Progress Notes (Signed)
NAME:  Earl Castillo, MRN:  [id number], DOB:  [birth year], LOS: 5 ADMISSION DATE:  06/16/2021, CONSULTATION DATE:  06/16/21 --  REFERRING MD:  Triad MD, CHIEF COMPLAINT:  Covid and cardiac arrest   Brief  80 year old male with multiple medical problems and multiple admissions who presented  06/11/2021 due to continued failure to thrive, bedbound increased frequency of falls with diagnosis of acute kidney injury, treatment of COVID-19 with Paxlovid.   On early hours of 06/16/2021 bradycardia followed by cardiac arrest.  ROSC with 1 round of epinephrine and less than 6 minutes of CPR.  Per documentation vomit noted at the time of intubation.  Initial ABG with respiratory acidosis.  Post cardiac arrest in the ICU not fully responsive but on fentanyl infusion.  Left pupil dilated.  Right upper extremity is weak.  Son at the bedside feels is a new finding. CT head without acute  stroke/acute findings but remote right thalamic and right' \\parieto'$ -occipital cortical infarcts. Advanced intracranial atherosclerotic disease.  Pertinent  Medical History  CAD CHF HLD HTN Anemia PAD Prior stroke with carotid stenosis  COVID - 19 Cardiac arrest Failure to thrive  Significant Hospital Events:  06/10/2021 - admit/. Urine leg/strep neg. Covid PCR positive 06/16/21 - cardiac arrest , epi x 1, ROSC -> ICU transfer.  PCCM takeover, ETT. ECHo -LVEF 65%.  Pseudonormalization with grade 2 diastolic dysfunction and elevated LVEDP.  Normal right ventricular function D-dimer went up from 1.2 to 3.94 8/14 -in acute renal failure despite fluids  On the ventilator FiO2 30%.  Trop pk 125.  Is comatose with just as needed sedation.  No posturing.  Right upper extremity remains weak.  CT head without any acute stroke.  Not on pressors.  Spot cerebellar EEG done -> Per Dr. Hortense Ramal left greater than right slowing but no seizures.  EEG can be stopped.  Son [name] DPOA at the bedside. 8/15 New onset A-fib RVR started on IV Amio and IV  Cardizem overnight   Interim History / Subjective:  No acute events overnight  Creatinine and BUN continue to rise but urine output increased with 1.4L removed   Objective   Blood pressure (!) 162/40, pulse 85, temperature 99.3 F (37.4 C), temperature source Core, resp. rate 20, height '5\' 7"'$  (1.702 m), weight 51.6 kg, SpO2 92 %.    Vent Mode: PRVC FiO2 (%):  [30 %] 30 % Set Rate:  [14 bmp-22 bmp] 14 bmp Vt Set:  [490 mL] 490 mL PEEP:  [5 cmH20] 5 cmH20 Plateau Pressure:  [14 cmH20-24 cmH20] 19 cmH20   Intake/Output Summary (Last 24 hours) at 06/19/2021 [id number] Last data filed at 06/19/2021 0800 Gross per 24 hour  Intake 1357.04 ml  Output 1505 ml  Net -147.96 ml    Filed Weights   07/02/2021 2100 06/18/21 0400 06/19/21 0500  Weight: 58.7 kg 56.5 kg 51.6 kg    Examination: General: Acute on chronically ill appearing elderly male lying in bed on mechanical ventilation, in NAD HEENT: ETT, MM pink/moist, PERRL,  Neuro: Slight attempt to open eyes to painful stimuli CV: Irregularly irregular, tachycardia,  s1s2 regular rate and rhythm, no murmur, rubs, or gallops,  PULM:  Diminished bilaterally with expiratory wheezing, tolerating vent, respiratory effort looks slightly more labored compared to day prior  GI: soft, bowel sounds active in all 4 quadrants, non-tender, non-distended, tolerating TF Extremities: warm/dry, non-pitting edema  Skin: no rashes or lesions   Resolved Hospital Problem list   Lactic acidosis  Assessment &  Plan:  Acute resp failure with severe resp acidosis  -In the setting of cardiac arrest  Concern for aspiration event at time of intubation  Smoker heavy at baseline with emphysema  P: SBT this am for hopes of extubation  Continue ventilator support with lung protective strategies  Wean PEEP and FiO2 for sats greater than 90%. Head of bed elevated 30 degrees. Plateau pressures less than 30 cm H20.  Follow intermittent chest x-ray and ABG.   Ensure  adequate pulmonary hygiene  VAP bundle in place  PAD protocol Remains on Unasyn   Acute anoxic encephalopathy  -no acute findings on CT head 06/16/2021) Hx of strokes, on DAPT Hx of Severe > 90% Left carotid artery stenosis June 2022  - complex surgery pending by VVS P:   Neuro evaluated this admission EEG negative for seizures  Maintain neuro protective measures; goal for eurothermia, euglycemia, eunatermia, normoxia, and PCO2 goal of 35-40 Nutrition and bowel regiment  Seizure precautions  Aspirations precautions  Minimize sedation   Cardiac arrest  -bradycardia followed by cardiac arrest.  ROSC with 1 round of epinephrine and less than 6 minutes of CPR. Severe  PAD and carotid stenosis Hx of HTN and HLD - On DAPT, Imdur, Lopressor, Procardia,  Hx of CAD and CHF  -EF 60-65% with no regional wall motion abnormality but grade II diastolic dysfunction  New onset A-fib RVR  -Developed early morning of 8/15 P:  Rate better controlled on PO cardizem  Try to stop IV amio  Resume home imdur and procardia  Continue DAPT Continuous telemetry  Strict intake and output  Daily assessment for need of diuretics  Optimize electrolytes   COVID-19 06/18/2021 admit diagnosis -Procal < 0.10 x2 -Paxlovid d/c 8/13 P:   Wean steroids  Supportive care   AKI superimposed on CKD  -Baseline creat 0.'86mg'$ % - 1.'0mg'$ % in 2022 P:  Follow renal function Monitor urine output Trend Bmet Avoid nephrotoxins Ensure adequate renal perfusion  Consider lasix trial   Severe protein calorie malnutrition  - documented June 2022 (alb 2.9-3) Worsening failure to thrive  P: Continue TF Protein supplementation   Mild anemia  P:  Trend CBC Hgb goal > 7 Monitor for signs of bleeding on DAPT  Hyperglycemia P: SSI  CBG check q4  Best Practice   Diet/type: NPO . TF consult requested DVT prophylaxis: on hold GI prophylaxis: PPI Lines: N/A Foley:  N/A Code Status:  full code Last date of  multidisciplinary goals of care discussion: Interdisciplinary goals of care meeting done on 06/17/2021 with son [name] and also nurse Morrie Sheldon at the bedside.  Poor prognosis discussed.  Underlying medical issues complicate his recovery as well.  Based on this DNR elected.  We will do a time-limited care of ICU full care over the next day to few days to several days and see how his course evolves.  If he is declining or if prognosis looks bleak then have a conversation about comfort care.  The son fully understands this.  Definitely no LTAC.  Definitely no tracheostomy.  Definitely no CRRT or dialysis.  Critical care:   Performed by: Jathniel Smeltzer D. Harris  Total critical care time: 40 minutes  Critical care time was exclusive of separately billable procedures and treating other patients.  Critical care was necessary to treat or prevent imminent or life-threatening deterioration.  Critical care was time spent personally by me on the following activities: development of treatment plan with patient and/or surrogate as well as nursing, discussions  with consultants, evaluation of patient's response to treatment, examination of patient, obtaining history from patient or surrogate, ordering and performing treatments and interventions, ordering and review of laboratory studies, ordering and review of radiographic studies, pulse oximetry and re-evaluation of patient's condition.  Makaylie Dedeaux D. [name], NP-C [facility] Personal contact information can be found on Amion  06/19/2021, 9:04 AM

## 2021-06-19 NOTE — Progress Notes (Signed)
Pharmacy Antibiotic Note  Earl Castillo is a 80 y.o. male admitted on 06/11/2021.  On 8/13 he had bradycardia and cardiac arrest with suspected aspiration at the time of intubation.  Pharmacy has been consulted for Unasyn dosing for suspected aspiration. - AKI continues to worsen with SCr up to 3.19 and CrCl ~ 13 ml/min - WBC elevated but improved to 15.9 - No fevers  Plan: Decrease to Unasyn 3g IV q24h Follow up renal function, culture results, and clinical course.   Height: '5\' 7"'$  (170.2 cm) Weight: 51.6 kg (113 lb 12.1 oz) IBW/kg (Calculated) : 66.1  Temp (24hrs), Avg:98.8 F (37.1 C), Min:98.2 F (36.8 C), Max:99.3 F (37.4 C)  Recent Labs  Lab 06/16/21 0438 06/16/21 0438 06/16/21 0626 06/16/21 0930 06/16/21 1839 06/16/21 2148 06/17/21 0209 06/18/21 0059 06/19/21 0525 06/19/21 0915  WBC 16.0*  --  20.0*  --   --   --  15.8* 20.5*  --  15.9*  CREATININE 1.97*  --  2.30*  --  2.29*  --  2.46* 2.84* 3.19*  --   LATICACIDVEN  --    < > 7.8* 4.5* 2.8* 3.4* 3.0* 1.5  --   --    < > = values in this interval not displayed.    Estimated Creatinine Clearance: 13.7 mL/min (A) (by C-G formula based on SCr of 3.19 mg/dL (H)).    No Known Allergies  Antimicrobials this admission: 8/11 CTX x1 8/11 azith>> 8/12 8/11 paxlovid>> 8/12 8/13 Zosyn x1  8/13 Unasyn >>    Microbiology results: 8/11 strep pneum neg 8/11 Resp panel: Covid +, Influenza A/B: neg 8/13 MRSA PCR: not detected  Thank you for allowing pharmacy to be a part of this patient's care.  Gretta Arab PharmD, BCPS Clinical Pharmacist [facility] main pharmacy [phone] 06/19/2021 11:00 AM

## 2021-06-20 DIAGNOSIS — U071 COVID-19: Secondary | ICD-10-CM | POA: Diagnosis not present

## 2021-06-20 DIAGNOSIS — J1282 Pneumonia due to coronavirus disease 2019: Secondary | ICD-10-CM | POA: Diagnosis not present

## 2021-06-20 LAB — PHOSPHORUS: Phosphorus: 3.9 mg/dL (ref 2.5–4.6)

## 2021-06-20 LAB — BASIC METABOLIC PANEL
Anion gap: 12 (ref 5–15)
BUN: 158 mg/dL — ABNORMAL HIGH (ref 8–23)
CO2: 22 mmol/L (ref 22–32)
Calcium: 7.4 mg/dL — ABNORMAL LOW (ref 8.9–10.3)
Chloride: 105 mmol/L (ref 98–111)
Creatinine, Ser: 3.76 mg/dL — ABNORMAL HIGH (ref 0.61–1.24)
GFR, Estimated: 16 mL/min — ABNORMAL LOW (ref >60)
Glucose, Bld: 242 mg/dL — ABNORMAL HIGH (ref 70–99)
Potassium: 4.7 mmol/L (ref 3.5–5.1)
Sodium: 139 mmol/L (ref 135–145)

## 2021-06-20 LAB — GLUCOSE, CAPILLARY
Glucose-Capillary: 176 mg/dL — ABNORMAL HIGH (ref 70–99)
Glucose-Capillary: 211 mg/dL — ABNORMAL HIGH (ref 70–99)
Glucose-Capillary: 222 mg/dL — ABNORMAL HIGH (ref 70–99)
Glucose-Capillary: 224 mg/dL — ABNORMAL HIGH (ref 70–99)

## 2021-06-20 LAB — NEURON-SPECIFIC ENOLASE(NSE), BLOOD: Neuron-specific Enolase, Serum: 19.3 ng/mL — ABNORMAL HIGH (ref 0.0–17.6)

## 2021-06-20 MED ORDER — GLYCOPYRROLATE 0.2 MG/ML IJ SOLN: 0.2000 mg | INTRAMUSCULAR | Status: DC | PRN

## 2021-06-20 MED ORDER — MORPHINE BOLUS VIA INFUSION
5.0000 mg | INTRAVENOUS | Status: DC | PRN
  Filled 2021-06-20: qty 5

## 2021-06-20 MED ORDER — LABETALOL HCL 5 MG/ML IV SOLN
10.0000 mg | INTRAVENOUS | Status: DC | PRN
Start: 2021-06-20 — End: 2021-06-20
  Administered 2021-06-20: 20 mg via INTRAVENOUS
  Filled 2021-06-20: qty 4

## 2021-06-20 MED ORDER — MIDAZOLAM HCL 2 MG/2ML IJ SOLN: 2.0000 mg | INTRAMUSCULAR | Status: DC | PRN

## 2021-06-20 MED ORDER — POLYVINYL ALCOHOL 1.4 % OP SOLN: 1.0000 [drp] | Freq: Four times a day (QID) | OPHTHALMIC | Status: DC | PRN

## 2021-06-20 MED ORDER — ACETAMINOPHEN 325 MG PO TABS: 650.0000 mg | ORAL_TABLET | Freq: Four times a day (QID) | ORAL | Status: DC | PRN

## 2021-06-20 MED ORDER — MORPHINE SULFATE (PF) 2 MG/ML IV SOLN: 2.0000 mg | INTRAVENOUS | Status: DC | PRN

## 2021-06-20 MED ORDER — DIPHENHYDRAMINE HCL 50 MG/ML IJ SOLN: 25.0000 mg | INTRAMUSCULAR | Status: DC | PRN

## 2021-06-20 MED ORDER — GLYCOPYRROLATE 1 MG PO TABS: 1.0000 mg | ORAL_TABLET | ORAL | Status: DC | PRN

## 2021-06-20 MED ORDER — ACETAMINOPHEN 650 MG RE SUPP: 650.0000 mg | Freq: Four times a day (QID) | RECTAL | Status: DC | PRN

## 2021-06-20 MED ORDER — MORPHINE BOLUS VIA INFUSION
5.0000 mg | INTRAVENOUS | Status: DC | PRN
Start: 2021-06-20 — End: 2021-06-20

## 2021-06-20 MED ORDER — METHYLPREDNISOLONE SODIUM SUCC 40 MG IJ SOLR: 40.0000 mg | Freq: Every day | INTRAMUSCULAR | Status: DC

## 2021-06-20 MED ORDER — MORPHINE 100MG IN NS 100ML (1MG/ML) PREMIX INFUSION
0.0000 mg/h | INTRAVENOUS | Status: DC
  Administered 2021-06-20: 5 mg/h via INTRAVENOUS
  Filled 2021-06-20: qty 100

## 2021-06-28 ENCOUNTER — Encounter (INDEPENDENT_AMBULATORY_CARE_PROVIDER_SITE_OTHER): Payer: Medicare Other | Admitting: Ophthalmology

## 2021-06-28 ENCOUNTER — Ambulatory Visit: Payer: Medicare Other | Admitting: General Practice

## 2021-07-02 ENCOUNTER — Encounter (INDEPENDENT_AMBULATORY_CARE_PROVIDER_SITE_OTHER): Payer: Medicare Other | Admitting: Ophthalmology

## 2021-07-05 NOTE — Progress Notes (Addendum)
NAME:  Earl Castillo, MRN:  [id number], DOB:  [birth year], LOS: 6 ADMISSION DATE:  06/27/2021, CONSULTATION DATE:  06/16/21 --  REFERRING MD:  Triad MD, CHIEF COMPLAINT:  Covid and cardiac arrest   Brief  80 year old male with multiple medical problems and multiple admissions who presented  06/23/2021 due to continued failure to thrive, bedbound increased frequency of falls with diagnosis of acute kidney injury, treatment of COVID-19 with Paxlovid.   On early hours of 06/16/2021 bradycardia followed by cardiac arrest.  ROSC with 1 round of epinephrine and less than 6 minutes of CPR.  Per documentation vomit noted at the time of intubation.  Initial ABG with respiratory acidosis.  Post cardiac arrest in the ICU not fully responsive but on fentanyl infusion.  Left pupil dilated.  Right upper extremity is weak.  Son at the bedside feels is a new finding. CT head without acute  stroke/acute findings but remote right thalamic and right' \\parieto'$ -occipital cortical infarcts. Advanced intracranial atherosclerotic disease.  Pertinent  Medical History  CAD CHF HLD HTN Anemia PAD Prior stroke with carotid stenosis  COVID - 19 Cardiac arrest Failure to thrive  Significant Hospital Events:  06/17/2021 - admit/. Urine leg/strep neg. Covid PCR positive 06/16/21 - cardiac arrest , epi x 1, ROSC -> ICU transfer.  PCCM takeover, ETT. ECHo -LVEF 65%.  Pseudonormalization with grade 2 diastolic dysfunction and elevated LVEDP.  Normal right ventricular function D-dimer went up from 1.2 to 3.94 8/14 -in acute renal failure despite fluids  On the ventilator FiO2 30%.  Trop pk 125.  Is comatose with just as needed sedation.  No posturing.  Right upper extremity remains weak.  CT head without any acute stroke.  Not on pressors.  Spot cerebellar EEG done -> Per Dr. Hortense Ramal left greater than right slowing but no seizures.  EEG can be stopped.  Son [name] DPOA at the bedside. 8/15 New onset A-fib RVR started on IV Amio and IV  Cardizem overnight  8/17 Patient continues to have progressive multisystem organ dysfunction, in this setting long discussion help with patients son [name] 8/16 regarding goals of care.  Interim History / Subjective:  No acute events overnight   Objective   Blood pressure (!) 162/52, pulse (!) 109, temperature 99.1 F (37.3 C), resp. rate 20, height '5\' 7"'$  (1.702 m), weight 51.6 kg, SpO2 90 %.    Vent Mode: PSV;CPAP FiO2 (%):  [40 %-50 %] 50 % Set Rate:  [14 bmp] 14 bmp Vt Set:  [490 mL] 490 mL PEEP:  [5 cmH20] 5 cmH20 Pressure Support:  [5 cmH20-8 cmH20] 5 cmH20 Plateau Pressure:  [12 cmH20-22 cmH20] 12 cmH20   Intake/Output Summary (Last 24 hours) at 02-Jul-2021 0954 Last data filed at 07-02-2021 0900 Gross per 24 hour  Intake 935.4 ml  Output 685 ml  Net 250.4 ml    Filed Weights   07/03/2021 2100 06/18/21 0400 06/19/21 0500  Weight: 58.7 kg 56.5 kg 51.6 kg    Examination: General: Acute on chronically ill appearing deconditioned elderly male on lying in bed on mechanical ventilation, in NAD HEENT: ETT, MM pink/moist, PERRL,  Neuro: Unresponsive on vent  CV: s1s2 regular rate and rhythm, no murmur, rubs, or gallops,  PULM:  Diminished bilaterally, tolerating vent but respiratory effort looks labored  GI: soft, bowel sounds active in all 4 quadrants, non-tender, non-distended, tolerating TF Extremities: warm/dry, generalized non-pitting edema  Skin: no rashes or lesions  Resolved Hospital Problem list   Lactic acidosis  Assessment & Plan:  Acute resp failure with severe resp acidosis  -In the setting of cardiac arrest  Concern for aspiration event at time of intubation  Smoker heavy at baseline with emphysema  P: Patient is tolerating pressure support but mentation precludes extubation  Continued GOC discussion regarding compassionate extubation Continue ventilator support with lung protective strategies  Wean PEEP and FiO2 for sats greater than 90%. Head of bed  elevated 30 degrees. Plateau pressures less than 30 cm H20.  Follow intermittent chest x-ray and ABG.  Ensure adequate pulmonary hygiene  Follow cultures  VAP bundle in place  PAD protocol Remains on Unasyn stop after 5 days   Acute anoxic encephalopathy  -no acute findings on CT head 06/16/2021) Hx of strokes, on DAPT Hx of Severe > 90% Left carotid artery stenosis June 2022  - complex surgery pending by [facility] P:   EEG negative for seizure  Management per neurology  Maintain neuro protective measures; goal for eurothermia, euglycemia, eunatermia, normoxia, and PCO2 goal of 35-40 Nutrition and bowel regiment  Seizure precautions  Aspirations precautions   Cardiac arrest  -bradycardia followed by cardiac arrest.  ROSC with 1 round of epinephrine and less than 6 minutes of CPR. Severe  PAD and carotid stenosis Hx of HTN and HLD - On DAPT, Imdur, Lopressor, Procardia,  Hx of CAD and CHF  -EF 60-65% with no regional wall motion abnormality but grade II diastolic dysfunction  New onset A-fib RVR  -Developed early morning of 8/15 P:  Continue IV amio and PO Cardizem  Continue DAPT Strict intake and output  Daily assessment for need of diuretics  Daily weight  Continuous telemetry  Optimize electrolytes   COVID-19 06/30/2021 admit diagnosis -Procal < 0.10 x2 -Paxlovid d/c 8/13 P:   Supportive care  Steroids weaned   AKI superimposed on CKD  -Baseline creat 0.'86mg'$ % - 1.'0mg'$ % in 2022 P:  Follow renal function  Monitor urine output Trend Bmet Avoid nephrotoxins Ensure adequate renal perfusion   Severe protein calorie malnutrition  - documented June 2022 (alb 2.9-3) Worsening failure to thrive  P: Continue TF Protein supplementation   Mild anemia  P:  Trend CBC  Hgb goal > 7 Monitor for signs of bleeding on DAPT  Hyperglycemia P: SSI  CBG q4hs  Goals of care 8/17: Patient continues to have progressive multisystem organ dysfunction, in this setting long  discussion help with patients son [name] 8/16 regarding goals of care. He states he understands how sick his dad is and indicates "my dad would never have wanted to be coded in the first place, I know that". Todd wished to discuss comfort measure with family prior to any decision being made. Palliative care is follow along as well   Best Practice   Diet/type: NPO . TF consult requested DVT prophylaxis: on hold GI prophylaxis: PPI Lines: N/A Foley:  N/A Code Status:  full code Last date of multidisciplinary goals of care discussion: Interdisciplinary goals of care meeting done on 8/16  Critical care:   Performed by: Whitney D. Harris  Total critical care time: 37 minutes  Critical care time was exclusive of separately billable procedures and treating other patients.  Critical care was necessary to treat or prevent imminent or life-threatening deterioration.  Critical care was time spent personally by me on the following activities: development of treatment plan with patient and/or surrogate as well as nursing, discussions with consultants, evaluation of patient's response to treatment, examination of patient, obtaining history from patient or  surrogate, ordering and performing treatments and interventions, ordering and review of laboratory studies, ordering and review of radiographic studies, pulse oximetry and re-evaluation of patient's condition.  Whitney D. [name], NP-C [facility] Personal contact information can be found on Amion  June 30, 2021, 9:54 AM  Critical care attending attestation note:   Patient seen and examined and relevant ancillary tests reviewed.  I agree with the assessment and plan of care as outlined by Gerald Leitz, NP.    80 year old admitted with COVID 19 pneumonia and CAP despite OP paxlovid. Had what sounds like aspiration event leading to code blue and intubation in hospital.    Not much change. Neuro exam is poor. Renal function  worsening. GOC ongoing bu grave prognosis. Prior had portable DNR. Comfort care would be recommend given lack of improvement.   Synopsis of assessment and plan:   Acute Hypoxemic Respiratory Failure on  ventilator. Primarily driven by aspiration event. Possible Covid Pneumonitis playing role but ventilator not behaving like ARDS.  --steroids, abx --PRVC, wean, trial PS but encephalopathy precludes extubation   Toxic metabolic encephalopathy: Likely related to cardiac arrest, delirium, infection.  Semipurposeful on exam. Overall no real improvement. --Minimize sedatives, as needed fentanyl -- Spot EEG negative for seizures   Cardiac arrest: In the setting of presumed hypoxemia from aspiration event.   History of stroke, carotid artery stenosis: --Continue dual antiplatelet therapy   Atrial fibrillation: New, present in the setting of respiratory failure on ventilator. --diltiazem per tube  Acute renal failure: Oliguric.  Creatinine rising.  Urine output trending down. --continue tube feeds   Diabetes with hyperglycemia: Worsened sign of steroid therapy for COVID. --Insulin, increase as needed   HTN --dilt, isordil per tube   CRITICAL CARE Performed by: Lanier Clam     Total critical care time: 31 minutes   Critical care time was exclusive of separately billable procedures and treating other patients.   Critical care was necessary to treat or prevent imminent or life-threatening deterioration.   Critical care was time spent personally by me on the following activities: development of treatment plan with patient and/or surrogate as well as nursing, discussions with consultants, evaluation of patient's response to treatment, examination of patient, obtaining history from patient or surrogate, ordering and performing treatments and interventions, ordering and review of laboratory studies, ordering and review of radiographic studies, pulse oximetry, re-evaluation of patient's  condition and participation in multidisciplinary rounds.   Lanier Clam, MD See Shea Evans for contact info

## 2021-07-05 NOTE — Progress Notes (Signed)
RT NOTE:  Pt extubated to comfort care measures per family request/CCMD order.

## 2021-07-05 NOTE — TOC Progression Note (Signed)
Transition of Care [facility]) - Progression Note    Patient Details  Name: Earl Castillo MRN: [id number] Date of Birth: 1941-04-29  Transition of [facility]) CM/SW Contact  Leeroy Cha, RN Phone Number: 06/30/21, 7:31 AM  Clinical Narrative:     06/16/2021 bradycardia followed by cardiac arrest.  ROSC with 1 round of epinephrine and less than 6 minutes of CPR.  Per documentation vomit noted at the time of intubation.  Initial ABG with respiratory acidosis.  Post cardiac arrest in the ICU not fully responsive but on fentanyl infusion.  Left pupil dilated.  Right upper extremity is weak.  Son at the bedside feels is a new finding. CT head without acute  stroke/acute findings but remote right thalamic and right' \\parieto'$ -occipital cortical infarcts. Advanced intracranial atherosclerotic disease.   Pertinent  Medical History  CAD CHF HLD HTN Anemia PAD Prior stroke with carotid stenosis  COVID - 19 Cardiac arrest Failure to thrive   Significant Hospital Events:  06/16/2021 - admit/. Urine leg/strep neg. Covid PCR positive 06/16/21 - cardiac arrest , epi x 1, ROSC -> ICU transfer.  PCCM takeover, ETT. ECHo -LVEF 65%.  Pseudonormalization with grade 2 diastolic dysfunction and elevated LVEDP.  Normal right ventricular function D-dimer went up from 1.2 to 3.94 8/14 -in acute renal failure despite fluids  On the ventilator FiO2 30%.  Trop pk 125.  Is comatose with just as needed sedation.  No posturing.  Right upper extremity remains weak.  CT head without any acute stroke.  Not on pressors.  Spot cerebellar EEG done -> Per Dr. Hortense Ramal left greater than right slowing but no seizures.  EEG can be stopped.  Son [name] DPOA at the bedside. 8/15 New onset A-fib RVR started on IV Amio and IV Cardizem overnight   TOC PLAN OF CARE: Unstable patient following for progression. Remains on the vent and sedated, iv solumedrol,unasyn, amiodarone aND PEPCID     Barriers to Discharge: Continued Medical Work  up  Expected Discharge Plan and Services     Discharge Planning Services: CM Consult   Living arrangements for the past 2 months: Single Family Home                                       Social Determinants of Health (SDOH) Interventions    Readmission Risk Interventions No flowsheet data found.

## 2021-07-05 NOTE — Progress Notes (Signed)
Family made the decision to proceed with compassionate extubation and full comfort care prior to RN assuming care of patient. Within an hour of shift, the patient became bradycardic and went asystole. Coralie Carpen, RN and Barnet Glasgow, RN pronounced time of death at 1958 hrs.  Family at bedside with the patient. Family took all of patient belongings with them.  [facility] was notified, and RN was told that patient was not a candidate for donation. Patient will be transferred to the morgue.

## 2021-07-05 NOTE — Progress Notes (Signed)
eLink Physician-Brief Progress Note Patient Name: Earl Castillo DOB: [birth year] MRN: [id number]   Date of Service  10-Jul-2021  HPI/Events of Note  Patient with hypertension unresponsive to Hydralazine.  eICU Interventions  Hydralazine discontinued, PRN Labetalol ordered.        Kerry Kass Aniket Paye 10-Jul-2021, 6:23 AM

## 2021-07-05 NOTE — Progress Notes (Signed)
PCCM Progress Note   Called to room stating that patient had suddenly became bradycardic and hypoxic. RT placed patient back on PRVC with maxed FIO2 of 100% and increased PEEP of 8.   At this time charge nurse contacted family for updated and recommendations made for family to present to bedside.   On family arrival I participated in further continued goals of care discussion and family collectively made the decision to proceed with compassionate extubation and full comfort care. Patient will be started on morphine drip prior to extubation.  Brantleigh Mifflin D. [name], NP-C [facility] Personal contact information can be found on Amion  07/19/21, 6:11 PM

## 2021-07-05 NOTE — Progress Notes (Signed)
Coralie Carpen, RN and Barnet Glasgow, RN wasted 15 mL of morphine drip (morphine 100 mg in NS 100 mL; '1mg'$ /mL).

## 2021-07-05 NOTE — Discharge Summary (Signed)
DEATH SUMMARY   Patient Details  Name: Earl Castillo MRN: [id number] DOB: 1941-08-14  Admission/Discharge Information   Admit Date:  Jun 28, 2021  Date of Death: Date of Death: 07/04/2021  Time of Death: Time of Death: Feb 03, 1957  Length of Stay: 6  Referring Physician: [name], MD   Reason(s) for Hospitalization  pneumonia  Diagnoses  Preliminary cause of death: acute hypoxemic respiratory failure due to ARDS Secondary Diagnoses (including complications and co-morbidities):  Principal Problem:   Pneumonia due to COVID-19 virus Active Problems:   Hyperlipidemia   Essential hypertension   PVD- Lt SFA PTA/stent 05/23/14   Carotid artery disease ([place])   PAD (peripheral artery disease) ([place])   AKI (acute kidney injury) ([place])   Asystole ([place])   DNR (do not resuscitate)   Brief Hospital Course (including significant findings, care, treatment, and services provided and events leading to death)  Earl Castillo is a 80 y.o. year old male who was admitted with pneumonia due to COVID-19 and failure to thrive.  He was treated with usual inpatient medications for COVID.  Unfortunately he had what sounds like a witnessed aspiration event leading to [place] and intubation.  Prior to hospitalization the patient had a portable DNR but this was not conveyed during this hospitalization.  He was brought to the ICU.  Continued aggressive care.  Despite aggressive measures, he continued to have significant encephalopathy following cardiac arrest.  In addition he developed progressive renal failure and progressive worsening hypoxemia due to ARDS.  He suddenly passed away after decision was made to make him comfort care by his son.  He was terminally extubated and expired surrounded by loved ones.    Pertinent Labs and Studies  Significant Diagnostic Studies DG CHEST PORT 1 VIEW  Result Date: 06/18/2021 CLINICAL DATA:  Pneumonia due to COVID, endotracheal tube EXAM: PORTABLE CHEST 1 VIEW  COMPARISON:  Chest radiograph obtained 1 day prior FINDINGS: The endotracheal tube tip is in the midthoracic trachea. The enteric catheter coiled in the stomach before coursing off the field of view. Median sternotomy wires are unchanged. The cardiomediastinal silhouette is stable. Hazy opacities projecting over both lower lobes have slightly worsened in the interim. There are trace bilateral pleural effusions, not significantly changed. There is no pneumothorax. The bones are stable. IMPRESSION: 1. Endotracheal tube tip in the midthoracic trachea. 2. Slightly worsened hazy opacities projecting over both lower lobes and unchanged trace bilateral pleural effusions. Electronically Signed   By: Valetta Mole M.D.   On: 06/18/2021 07:57   DG CHEST PORT 1 VIEW  Result Date: 06/17/2021 CLINICAL DATA:  80 year old male COVID-30.  Intubated. EXAM: PORTABLE CHEST 1 VIEW COMPARISON:  Portable chest 06/16/2021 and earlier. FINDINGS: Portable AP semi upright view at 0517 hours. Endotracheal tube tip between the clavicles and carina. Enteric tube terminates in the distal stomach. Pulmonary hyperinflation. Prior sternotomy. Stable cardiac size and mediastinal contours. Possible small right pleural effusion is stable. Patchy interstitial and streaky bilateral pulmonary opacity is increased since 2021/06/28, stable since yesterday. No pneumothorax. No pulmonary edema. Negative visible bowel gas. IMPRESSION: 1.  Stable lines and tubes. 2. Bilateral COVID-19 pneumonia suspected and stable since yesterday. 3. Underlying hyperinflation. Possible small right pleural effusion. 4. No new cardiopulmonary abnormality. Electronically Signed   By: Genevie Ann M.D.   On: 06/17/2021 08:07   DG Chest Port 1 View  Result Date: 06/16/2021 CLINICAL DATA:  Cardiac arrest EXAM: PORTABLE CHEST 1 VIEW COMPARISON:  2021/06/28 FINDINGS: Endotracheal tube 1.5  cm from carina. Sternal wires overlie normal cardiac silhouette. Mild central pulmonary edema  pattern increased from prior. No pneumothorax. Lungs well expanded. IMPRESSION: 1. Endotracheal tube 1.5 cm from carina. 2. Lungs well expanded. 3. New central mild pulmonary edema pattern. Electronically Signed   By: Suzy Bouchard M.D.   On: 06/16/2021 06:50   DG Chest Port 1 View  Result Date: 06/13/2021 CLINICAL DATA:  Not able to walk.  Recent COVID diagnosis EXAM: PORTABLE CHEST 1 VIEW COMPARISON:  04/12/2021 FINDINGS: Prior median sternotomy. Heart size within normal limits. Atherosclerotic aortic arch. Vascular congestion with mild bilateral airspace disease. This could represent pneumonia or mild edema. No effusion. IMPRESSION: Mild bilateral airspace disease with slight progression. Question fluid overload versus pneumonia. Electronically Signed   By: Franchot Gallo M.D.   On: 06/19/2021 15:02   DG Abd Portable 1V  Result Date: 06/16/2021 CLINICAL DATA:  80 year old male enteric tube placement. Status post cardiac arrest. EXAM: PORTABLE ABDOMEN - 1 VIEW COMPARISON:  Portable chest at 0615 hours today. FINDINGS: Portable AP semi upright view at 0734 hours. Enteric tube placed into the stomach, tip at the gastric antrum. Stable lung bases and mediastinal contours. Mildly increased small bowel gas in the visible abdomen. There is gas and stool in the visible large bowel. Right upper quadrant small round 5 mm gallstone versus nephrolithiasis. Aortoiliac calcified atherosclerosis. No acute osseous abnormality identified. IMPRESSION: 1. Enteric tube placed into the stomach, tip at the gastric antrum. 2. Possible mild ileus. 3. Round right upper quadrant 5 mm calculus might be gallstone versus nephrolithiasis. Electronically Signed   By: Genevie Ann M.D.   On: 06/16/2021 07:54   ECHOCARDIOGRAM COMPLETE  Result Date: 06/16/2021    ECHOCARDIOGRAM REPORT   Patient Name:   Earl Castillo Date of Exam: 06/16/2021 Medical Rec #:  [id number]       Height:       67.0 in Accession #:    [id number]      Weight:        129.4 lb Date of Birth:  Jul 22, 1941        BSA:          1.681 m Patient Age:    42 years        BP:           123/58 mmHg Patient Gender: M               HR:           69 bpm. Exam Location:  Inpatient Procedure: 2D Echo, Cardiac Doppler, Color Doppler and Intracardiac            Opacification Agent Indications:     Cardiac arrest I46.9  History:         Patient has prior history of Echocardiogram examinations, most                  recent 04/10/2021. CHF, CAD, Carotid Disease, PAD and Stroke;                  Risk Factors:Hypertension and Dyslipidemia. COVID 19. Chronic                  recurrent falls, acute hypoxic respiratory failure, failure to                  thrive.  Sonographer:     Darlina Sicilian RDCS Referring Phys:  Ignacio Diagnosing Phys: Eleonore Chiquito MD  Sonographer Comments:  Echo performed with patient supine and on artificial respirator. IMPRESSIONS  1. Left ventricular ejection fraction, by estimation, is 60 to 65%. The left ventricle has normal function. The left ventricle has no regional wall motion abnormalities. There is mild asymmetric left ventricular hypertrophy of the basal-septal segment. Left ventricular diastolic parameters are consistent with Grade II diastolic dysfunction (pseudonormalization). Elevated left ventricular end-diastolic pressure.  2. Right ventricular systolic function is normal. The right ventricular size is normal.  3. The mitral valve is grossly normal. No evidence of mitral valve regurgitation. No evidence of mitral stenosis.  4. The aortic valve is tricuspid. Aortic valve regurgitation is not visualized. No aortic stenosis is present. Comparison(s): Changes from prior study are noted. EF remains normal. G2DD now present. FINDINGS  Left Ventricle: Left ventricular ejection fraction, by estimation, is 60 to 65%. The left ventricle has normal function. The left ventricle has no regional wall motion abnormalities. Definity contrast agent was given IV to  delineate the left ventricular  endocardial borders. The left ventricular internal cavity size was normal in size. There is mild asymmetric left ventricular hypertrophy of the basal-septal segment. Left ventricular diastolic parameters are consistent with Grade II diastolic dysfunction (pseudonormalization). Elevated left ventricular end-diastolic pressure. The E/e' is 17.7. Right Ventricle: The right ventricular size is normal. No increase in right ventricular wall thickness. Right ventricular systolic function is normal. Left Atrium: Left atrial size was normal in size. Right Atrium: Right atrial size was normal in size. Pericardium: There is no evidence of pericardial effusion. Mitral Valve: The mitral valve is grossly normal. No evidence of mitral valve regurgitation. No evidence of mitral valve stenosis. Tricuspid Valve: The tricuspid valve is grossly normal. Tricuspid valve regurgitation is trivial. No evidence of tricuspid stenosis. Aortic Valve: The aortic valve is tricuspid. Aortic valve regurgitation is not visualized. No aortic stenosis is present. Pulmonic Valve: The pulmonic valve was grossly normal. Pulmonic valve regurgitation is not visualized. No evidence of pulmonic stenosis. Aorta: The aortic root and ascending aorta are structurally normal, with no evidence of dilitation. Venous: IVC assessment for right atrial pressure unable to be performed due to mechanical ventilation. IAS/Shunts: The atrial septum is grossly normal.  LEFT VENTRICLE PLAX 2D LVIDd:         4.80 cm      Diastology LVIDs:         3.90 cm      LV e' medial:    5.11 cm/s LV PW:         1.10 cm      LV E/e' medial:  17.6 LV IVS:        1.40 cm      LV e' lateral:   4.87 cm/s LVOT diam:     2.10 cm      LV E/e' lateral: 18.5 LV SV:         71 LV SV Index:   42 LVOT Area:     3.46 cm  LV Volumes (MOD) LV vol d, MOD A2C: 126.0 ml LV vol d, MOD A4C: 104.0 ml LV vol s, MOD A2C: 39.1 ml LV vol s, MOD A4C: 29.5 ml LV SV MOD A2C:     86.9  ml LV SV MOD A4C:     104.0 ml LV SV MOD BP:      85.4 ml LEFT ATRIUM             Index LA diam:        3.80 cm 2.26 cm/m LA  Vol [facility]):   54.2 ml 32.25 ml/m LA Vol (A4C):   44.4 ml 26.42 ml/m LA Biplane Vol: 52.7 ml 31.36 ml/m  AORTIC VALVE LVOT Vmax:   80.20 cm/s LVOT Vmean:  58.900 cm/s LVOT VTI:    0.206 m  AORTA Ao Root diam: 3.30 cm Ao Asc diam:  3.30 cm MITRAL VALVE               TRICUSPID VALVE MV Area (PHT): 5.93 cm    TR Peak grad:   44.1 mmHg MV Decel Time: 128 msec    TR Vmax:        332.00 cm/s MV E velocity: 89.95 cm/s MV A velocity: 62.95 cm/s  SHUNTS MV E/A ratio:  1.43        Systemic VTI:  0.21 m                            Systemic Diam: 2.10 cm Eleonore Chiquito MD Electronically signed by Eleonore Chiquito MD Signature Date/Time: 06/16/2021/11:49:51 AM    Final (Updated)    CT HEAD CODE STROKE WO CONTRAST  Addendum Date: 06/16/2021   ADDENDUM REPORT: 06/16/2021 10:31 ADDENDUM: Study discussed by telephone with Dr. Brand Males on 06/16/2021 at 1019 hours. Electronically Signed   By: Genevie Ann M.D.   On: 06/16/2021 10:31   Result Date: 06/16/2021 CLINICAL DATA:  Code stroke. 80 year old male status post cardiac arrest. Neurologic deficit. EXAM: CT HEAD WITHOUT CONTRAST TECHNIQUE: Contiguous axial images were obtained from the base of the skull through the vertex without intravenous contrast. COMPARISON:  Head CT 04/09/2021. FINDINGS: Brain: Stable cerebral volume compared to June. No midline shift, ventriculomegaly, mass effect, evidence of mass lesion, intracranial hemorrhage or evidence of cortically based acute infarction. Stable small chronic area of cortical encephalomalacia in the right parietal lobe best seen on coronal image 58. Stable patchy and confluent bilateral cerebral white matter hypodensity, asymmetric involvement of the right thalamus. No cerebral edema is evident. Vascular: Calcified atherosclerosis at the skull base. No suspicious intracranial vascular hyperdensity. Skull: No  acute osseous abnormality identified. Congenital incomplete ossification of the posterior C1 ring. Sinuses/Orbits: Maxillary and sphenoid sinus fluid levels. Left frontal sinus osteoma appears stable and inconsequential. Tympanic cavities and mastoids remain clear. Other: Intubated. Only trace retained secretions in the visible pharynx. No acute orbit or scalp soft tissue finding. ASPECTS [facility] Stroke Program Early CT Score) Total score (0-10 with 10 being normal): 10 IMPRESSION: 1. Stable non contrast CT appearance of the brain since June. No acute cortically based infarct or acute intracranial hemorrhage identified. ASPECTS 10. 2. Intubated with some fluid in the paranasal sinuses. Electronically Signed: By: Genevie Ann M.D. On: 06/16/2021 10:16   VAS [place] LOWER EXTREMITY VENOUS (DVT)  Result Date: 06/17/2021  Lower Venous DVT Study Patient Name:  BAYLIN SUBA  Date of Exam:   06/17/2021 Medical Rec #: [id number]        Accession #:    [id number] Date of Birth: 06/13/1941         Patient Gender: M Patient Age:   75 years Exam Location[facility]  [facility] Procedure:      VAS [place] LOWER EXTREMITY VENOUS (DVT) Referring Phys: MURALI RAMASWAMY --------------------------------------------------------------------------------  Indications: Covid, elevated ddimer.  Comparison Study: no priro Performing Technologist: Archie Patten RVS  Examination Guidelines: A complete evaluation includes B-mode imaging, spectral Doppler, color Doppler, and power Doppler as needed of all accessible portions of each  vessel. Bilateral testing is considered an integral part of a complete examination. Limited examinations for reoccurring indications may be performed as noted. The reflux portion of the exam is performed with the patient in reverse Trendelenburg.  +---------+---------------+---------+-----------+----------+--------------+ RIGHT    CompressibilityPhasicitySpontaneityPropertiesThrombus Aging  +---------+---------------+---------+-----------+----------+--------------+ CFV      Full           Yes      Yes                                 +---------+---------------+---------+-----------+----------+--------------+ SFJ      Full                                                        +---------+---------------+---------+-----------+----------+--------------+ FV Prox  Full                                                        +---------+---------------+---------+-----------+----------+--------------+ FV Mid   Full                                                        +---------+---------------+---------+-----------+----------+--------------+ FV DistalFull                                                        +---------+---------------+---------+-----------+----------+--------------+ PFV      Full                                                        +---------+---------------+---------+-----------+----------+--------------+ POP      Full           Yes      Yes                                 +---------+---------------+---------+-----------+----------+--------------+ PTV      Full                                                        +---------+---------------+---------+-----------+----------+--------------+ PERO     Full                                                        +---------+---------------+---------+-----------+----------+--------------+   +---------+---------------+---------+-----------+----------+--------------+ LEFT     CompressibilityPhasicitySpontaneityPropertiesThrombus Aging +---------+---------------+---------+-----------+----------+--------------+  CFV      Full           Yes      Yes                                 +---------+---------------+---------+-----------+----------+--------------+ SFJ      Full                                                         +---------+---------------+---------+-----------+----------+--------------+ FV Prox  Full                                                        +---------+---------------+---------+-----------+----------+--------------+ FV Mid   Full                                                        +---------+---------------+---------+-----------+----------+--------------+ FV DistalFull                                                        +---------+---------------+---------+-----------+----------+--------------+ PFV      Full                                                        +---------+---------------+---------+-----------+----------+--------------+ POP      Full           Yes      Yes                                 +---------+---------------+---------+-----------+----------+--------------+ PTV      Full                                                        +---------+---------------+---------+-----------+----------+--------------+ PERO     Full                                                        +---------+---------------+---------+-----------+----------+--------------+     Summary: BILATERAL: - No evidence of deep vein thrombosis seen in the lower extremities, bilaterally. -No evidence of popliteal cyst, bilaterally.   *See table(s) above for measurements and observations. Electronically signed by Harold Barban MD on 06/17/2021 at 3:28:54 PM.    Final     Microbiology Recent  Results (from the past 240 hour(s))  Resp Panel by RT-PCR (Flu A&B, Covid) Nasopharyngeal Swab     Status: Abnormal   Collection Time: 06/23/2021  1:04 PM   Specimen: Nasopharyngeal Swab; Nasopharyngeal(NP) swabs in vial transport medium  Result Value Ref Range Status   SARS Coronavirus 2 by RT PCR POSITIVE (A) NEGATIVE Final    Comment: RESULT CALLED TO, READ BACK BY AND VERIFIED WITH: A.BANNO, RN AT 1519 ON 08.11.22 BY N.THOMPSON (NOTE) SARS-CoV-2 target nucleic acids are  DETECTED.  The SARS-CoV-2 RNA is generally detectable in upper respiratory specimens during the acute phase of infection. Positive results are indicative of the presence of the identified virus, but do not rule out bacterial infection or co-infection with other pathogens not detected by the test. Clinical correlation with patient history and other diagnostic information is necessary to determine patient infection status. The expected result is Negative.  Fact Sheet for Patients: EntrepreneurPulse.com.au  Fact Sheet for Healthcare Providers: IncredibleEmployment.be  This test is not yet approved or cleared by the [place] FDA and  has been authorized for detection and/or diagnosis of SARS-CoV-2 by FDA under an Emergency Use Authorization (EUA).  This EUA will remain in effect (meaning this  test can be used) for the duration of  the COVID-19 declaration under Section 564(b)(1) of the Act, 21 U.S.C. section 360bbb-3(b)(1), unless the authorization is terminated or revoked sooner.     Influenza A by PCR NEGATIVE NEGATIVE Final   Influenza B by PCR NEGATIVE NEGATIVE Final    Comment: (NOTE) The Xpert Xpress SARS-CoV-2/FLU/RSV plus assay is intended as an aid in the diagnosis of influenza from Nasopharyngeal swab specimens and should not be used as a sole basis for treatment. Nasal washings and aspirates are unacceptable for Xpert Xpress SARS-CoV-2/FLU/RSV testing.  Fact Sheet for Patients: EntrepreneurPulse.com.au  Fact Sheet for Healthcare Providers: IncredibleEmployment.be  This test is not yet approved or cleared by the [place] FDA and has been authorized for detection and/or diagnosis of SARS-CoV-2 by FDA under an Emergency Use Authorization (EUA). This EUA will remain in effect (meaning this test can be used) for the duration of the COVID-19 declaration under Section 564(b)(1) of the Act, 21  U.S.C. section 360bbb-3(b)(1), unless the authorization is terminated or revoked.  Performed at [facility], [place] [address] [place], [place] [address]   MRSA Next Gen by PCR, Nasal     Status: None   Collection Time: 06/16/21  7:15 AM   Specimen: Nasal Mucosa; Nasal Swab  Result Value Ref Range Status   MRSA by PCR Next Gen NOT DETECTED NOT DETECTED Final    Comment: (NOTE) The GeneXpert MRSA Assay (FDA approved for NASAL specimens only), is one component of a comprehensive MRSA colonization surveillance program. It is not intended to diagnose MRSA infection nor to guide or monitor treatment for MRSA infections. Test performance is not FDA approved in patients less than 69 years old. Performed at [facility], [address] [place], [place] [address]     Lab Basic Metabolic Panel: Recent Labs  Lab 06/17/21 0209 06/18/21 0059 06/18/21 1703 06/19/21 0525 06/19/21 1659 21-Jun-2021 0255 21-Jun-2021 0910  NA 138 139  --  141  --   --  139  K 4.0 3.6  --  4.5  --   --  4.7  CL 107 105  --  106  --   --  105  CO2 23 23  --  22  --   --  22  GLUCOSE 221* 253*  --  240*  --   --  242*  BUN 69* 79*  --  117*  --   --  158*  CREATININE 2.46* 2.84*  --  3.19*  --   --  3.76*  CALCIUM 7.6* 8.0*  --  7.8*  --   --  7.4*  MG  --  2.9* 3.0* 3.0* 3.2*  --   --   PHOS  --  3.6 2.8 2.8 4.4 3.9  --    Liver Function Tests: Recent Labs  Lab 06/17/21 0209 06/18/21 0059 06/19/21 0525  AST 41 23 32  ALT 34 27 20  ALKPHOS 39 45 41  BILITOT 0.4 0.6 0.6  PROT 6.1* 7.0 6.1*  ALBUMIN 2.4* 2.6* 2.2*   No results for input(s): LIPASE, AMYLASE in the last 168 hours. No results for input(s): AMMONIA in the last 168 hours. CBC: Recent Labs  Lab 06/17/21 0209 06/18/21 0059 06/19/21 0915  WBC 15.8* 20.5* 15.9*  NEUTROABS 14.9* 19.0* 14.5*  HGB 9.7* 10.1* 9.2*  HCT 30.6* 31.1* 30.5*  MCV 98.7 97.2 103.7*  PLT 224 265 219   Cardiac Enzymes: No  results for input(s): CKTOTAL, CKMB, CKMBINDEX, TROPONINI in the last 168 hours. Sepsis Labs: Recent Labs  Lab 06/16/21 2148 06/17/21 0209 06/18/21 0059 06/19/21 0915  WBC  --  15.8* 20.5* 15.9*  LATICACIDVEN 3.4* 3.0* 1.5  --     Procedures/Operations  As per EMR   Bonna Gains Ralph Benavidez 06/23/2021, 6:41 PM

## 2021-07-05 NOTE — Plan of Care (Signed)
  Interdisciplinary Goals of Care Family Meeting   Date carried out: July 04, 2021  Location of the meeting: Phone conference  Member's involved: Nurse Practitioner and Family Member or next of kin  Durable Power of Attorney or acting medical decision maker: Earl Castillo    Discussion: We discussed goals of care for Earl Castillo .  Earl Castillo was contacted again the afternoon of 8/17 for daily update and further discussion of goals of care. He was informed that His father continues to have progressive multisystem organ failure. Earl Castillo again states that he understands that his father is very sick and that he needs to make a decision on comfort care but he states "selfishly I don't want to make this decision but I know I have to soon"  After further discussion Earl Castillo stated he wanted to take a little more time to discuss with family but agreed to no escalation of care and a tentative plan was made for a compassionate extubation the morning of 8/19  Code status: Full DNR with NO escalation of care   Disposition: In-patient comfort care  Time spent for the meeting: 35  Earl Castillo D. [name], NP-C [facility] Personal contact information can be found on Amion  2021-07-04, 2:50 PM

## 2021-07-05 DEATH — deceased

## 2022-06-28 IMAGING — CT CT ANGIO CHEST
2 of 6 series · 17 of 36 positions shown · IV contrast (omnipaque)
Comparison: None.
COMPARISON: None.

Addendum:
CLINICAL DATA: Shortness of breath and weakness.  Recent surgery.

EXAM:
CT ANGIOGRAPHY CHEST WITH CONTRAST
TECHNIQUE: Multidetector CT imaging of the chest was performed using the
standard protocol during bolus administration of intravenous
contrast. Multiplanar CT image reconstructions and MIPs were
obtained to evaluate the vascular anatomy.
CONTRAST:  75mL OMNIPAQUE IOHEXOL 350 MG/ML SOLN

[Series 5: thins · axial · 0.67mm/px · z∈[-236,+41]mm · 16 of 313 slices shown]
[im 18/313  lung]
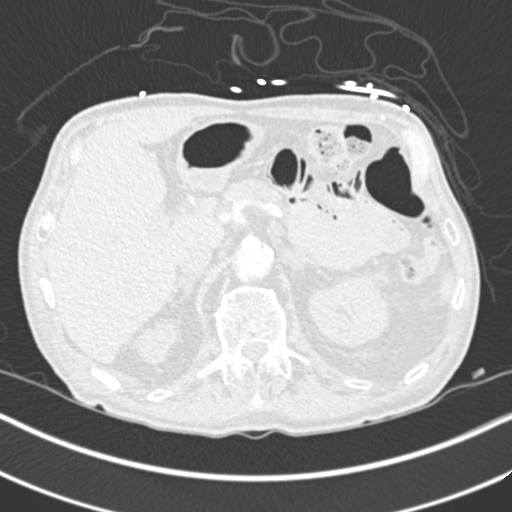
[im 35/313  mediastinal]
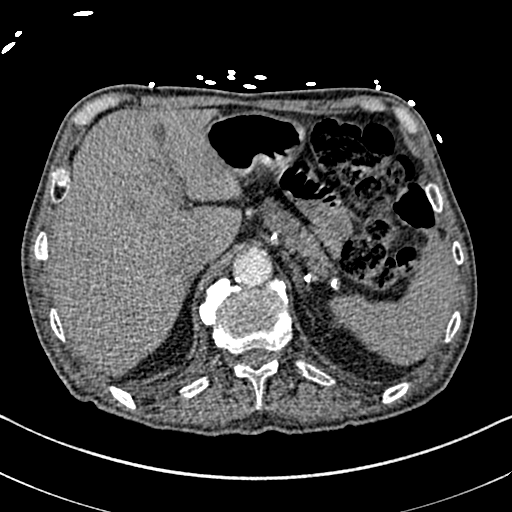
[im 53/313  lung]
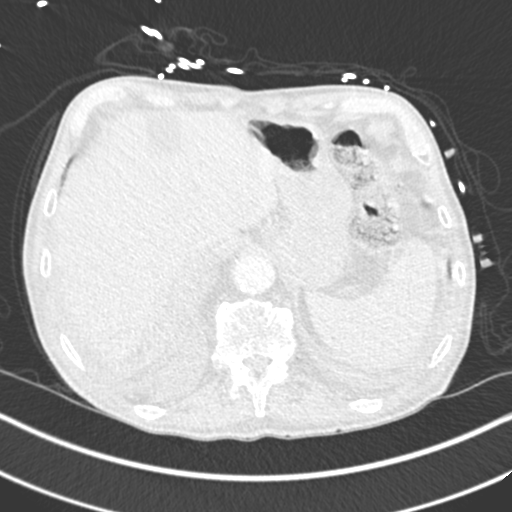
[im 70/313  mediastinal]
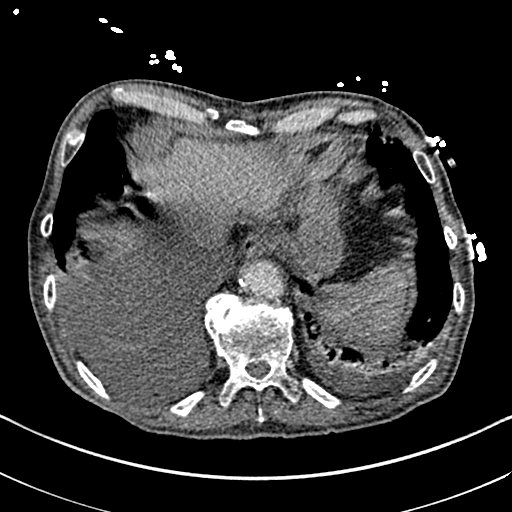
[im 87/313  lung]
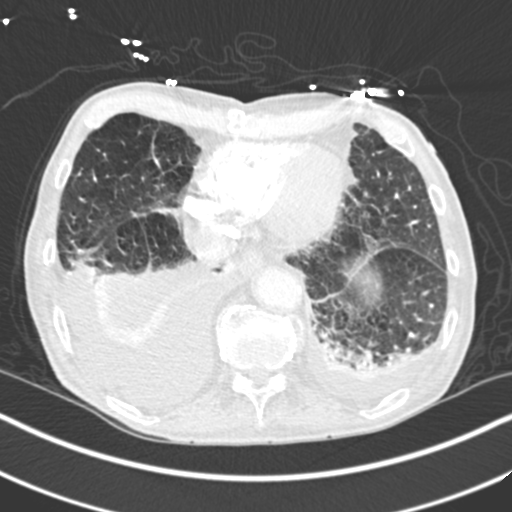
[im 105/313  mediastinal]
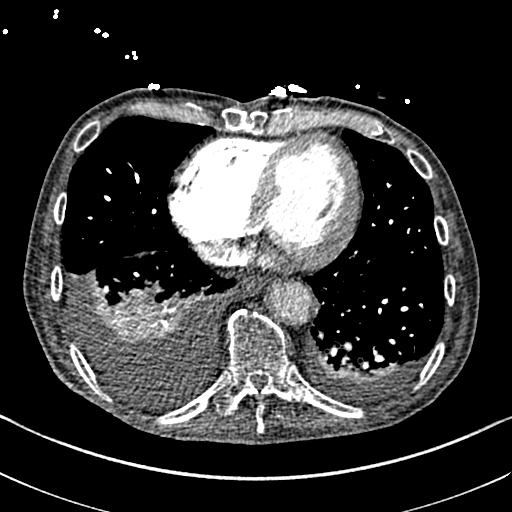
[im 122/313  lung]
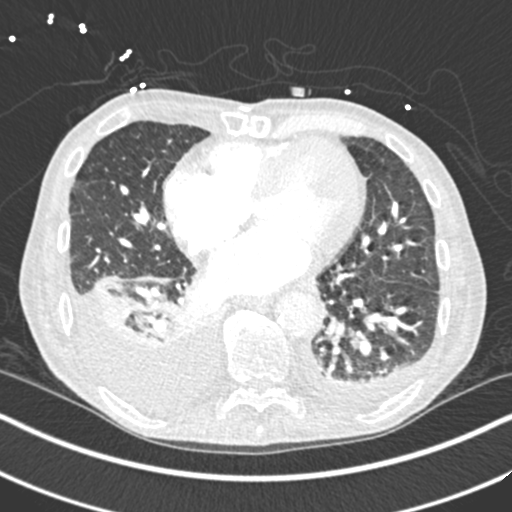
[im 139/313  mediastinal]
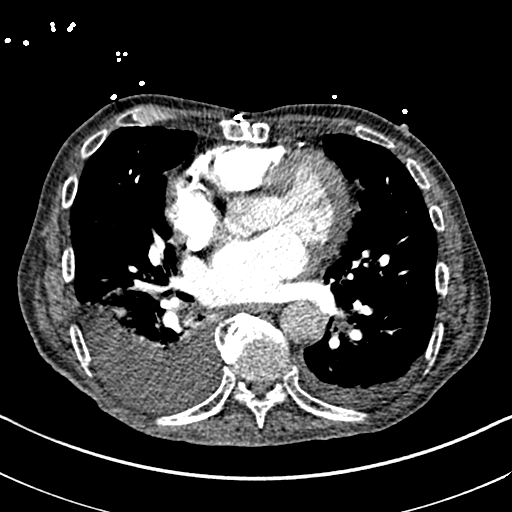
[im 174/313  lung]
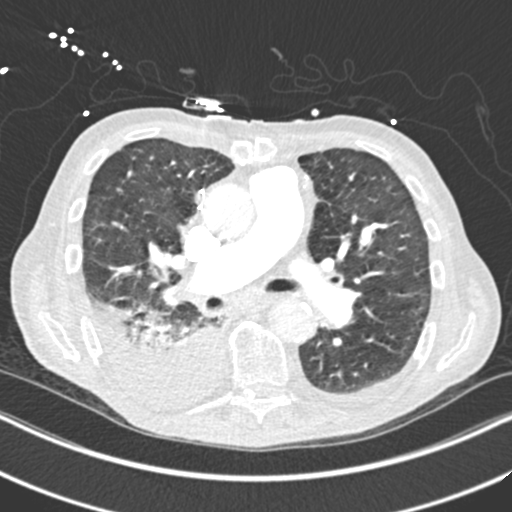
[im 191/313  mediastinal]
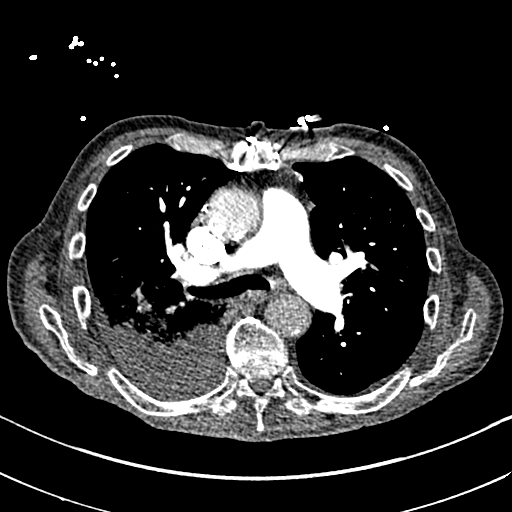
[im 209/313  lung]
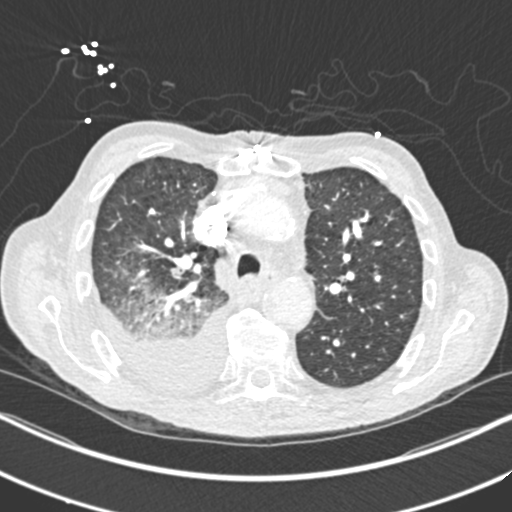
[im 226/313  mediastinal]
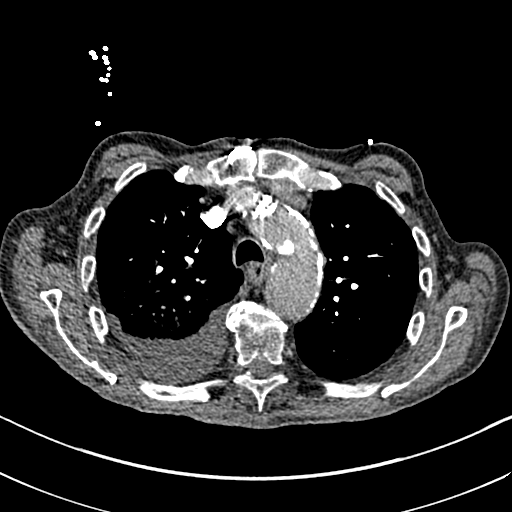
[im 243/313  lung]
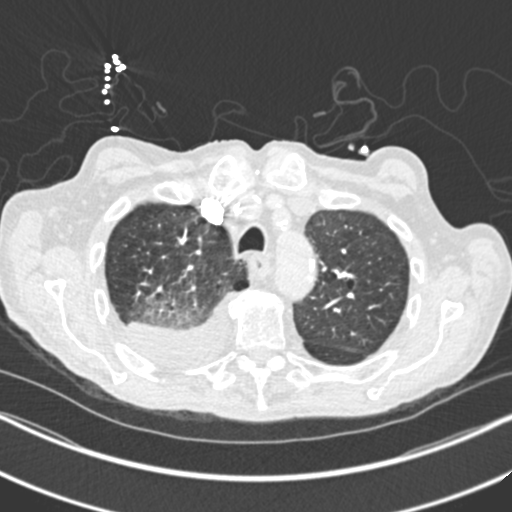
[im 261/313  mediastinal]
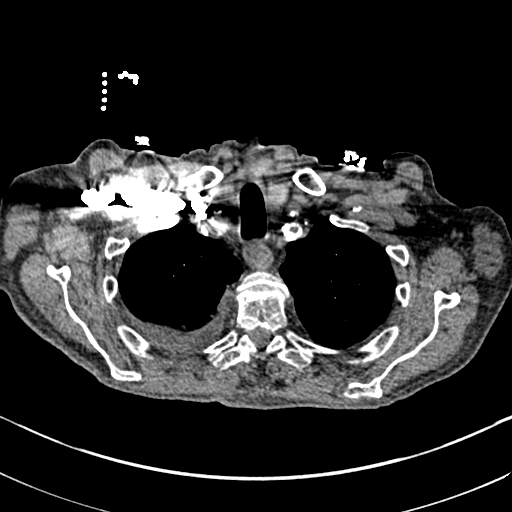
[im 278/313  lung]
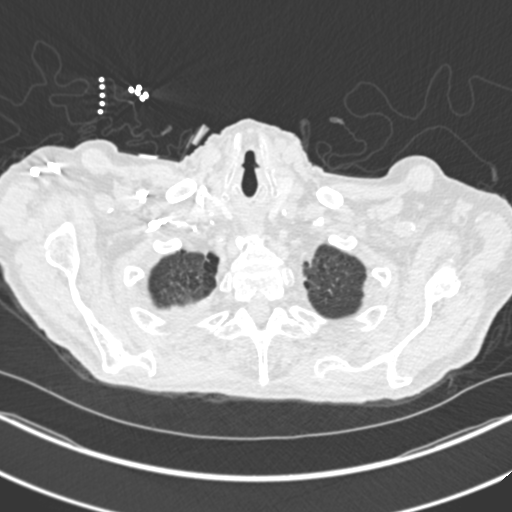
[im 295/313  mediastinal]
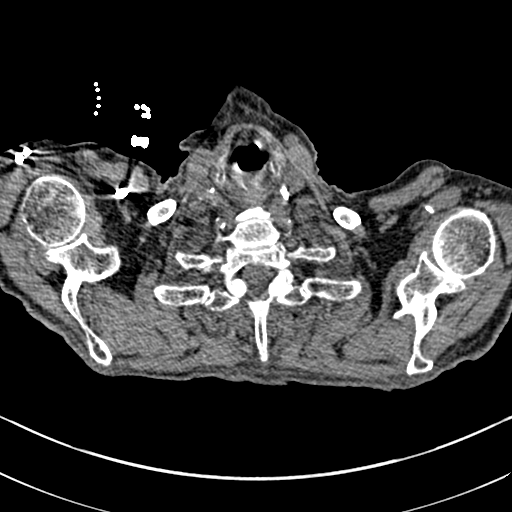

[Series 7: coronal mpr · coronal · 0.62mm/px · 1 of 123 slices shown]
[im 62/123  mediastinal]
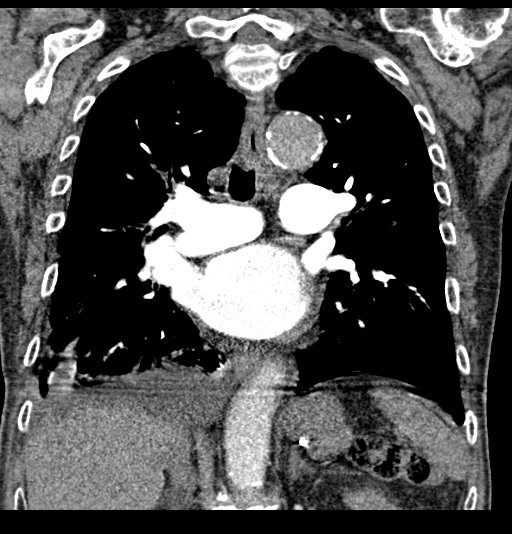

[17 of 36 positions shown; findings below may reference images not displayed]

FINDINGS: Cardiovascular: Image quality is degraded by respiratory motion. No
pulmonary embolus. Atherosclerotic calcification of the aorta and
coronary arteries. Pulmonic trunk and heart are enlarged. No
pericardial effusion.

Mediastinum/Nodes: No pathologically enlarged mediastinal, hilar or
axillary lymph nodes. Esophagus is grossly unremarkable.

Lungs/Pleura: Centrilobular and paraseptal emphysema. Image quality
is degraded by respiratory motion. Large right, and small left,
pleural effusions. Collapse/consolidation in the right lower lobe.
Minimal compressive atelectasis in the left lower lobe. Basilar
predominant septal thickening with patchy ground-glass in the
posterior right upper lobe. Airway is unremarkable.

Upper Abdomen: 3.5 cm low-attenuation lesion in the left hepatic
lobe is likely a cyst. Visualized portions of the liver,
gallbladder, adrenal glands and right kidney are otherwise
unremarkable. Exophytic low-attenuation lesion off the left kidney
measures 2.1 cm, incompletely visualized. Visualized portions of the
spleen, pancreas, stomach and bowel are grossly unremarkable.

Musculoskeletal: Degenerative changes in the spine. No worrisome
lytic or sclerotic lesions. Flowing anterior osteophytosis in the
thoracic spine.

Review of the MIP images confirms the above findings.
IMPRESSION: 1. Negative for pulmonary embolus.
2. Congestive heart failure with right greater than left pleural
effusions.
3. Difficult to exclude superimposed pneumonia in the right upper
and right lower lobes.
4. Aortic atherosclerosis ([id number]-[id number].K). Coronary artery
calcification.
5. Enlarged pulmonic trunk, indicative of pulmonary arterial
hypertension.
6.  Emphysema ([id number].T).

ADDENDUM:
Initial report by Dr. Manyoma, addendum by Dr. Amini. Upon further
review, there is an acute right eleventh rib minimally displaced
fracture, including on 157/4. There are remote bilateral lower rib
fractures as well.

*** End of Addendum ***
FINDINGS: Cardiovascular: Image quality is degraded by respiratory motion. No
pulmonary embolus. Atherosclerotic calcification of the aorta and
coronary arteries. Pulmonic trunk and heart are enlarged. No
pericardial effusion.

Mediastinum/Nodes: No pathologically enlarged mediastinal, hilar or
axillary lymph nodes. Esophagus is grossly unremarkable.

Lungs/Pleura: Centrilobular and paraseptal emphysema. Image quality
is degraded by respiratory motion. Large right, and small left,
pleural effusions. Collapse/consolidation in the right lower lobe.
Minimal compressive atelectasis in the left lower lobe. Basilar
predominant septal thickening with patchy ground-glass in the
posterior right upper lobe. Airway is unremarkable.

Upper Abdomen: 3.5 cm low-attenuation lesion in the left hepatic
lobe is likely a cyst. Visualized portions of the liver,
gallbladder, adrenal glands and right kidney are otherwise
unremarkable. Exophytic low-attenuation lesion off the left kidney
measures 2.1 cm, incompletely visualized. Visualized portions of the
spleen, pancreas, stomach and bowel are grossly unremarkable.

Musculoskeletal: Degenerative changes in the spine. No worrisome
lytic or sclerotic lesions. Flowing anterior osteophytosis in the
thoracic spine.

Review of the MIP images confirms the above findings.
IMPRESSION: 1. Negative for pulmonary embolus.
2. Congestive heart failure with right greater than left pleural
effusions.
3. Difficult to exclude superimposed pneumonia in the right upper
and right lower lobes.
4. Aortic atherosclerosis ([id number].K). Coronary artery
calcification.
5. Enlarged pulmonic trunk, indicative of pulmonary arterial
hypertension.
6.  Emphysema ([id number].T).

## 2022-06-29 IMAGING — DX DG CHEST 1V PORT
1 series · 1 of 1 positions shown · non-contrast
Comparison: 04/09/2021

CLINICAL DATA: Multiple falls.

EXAM:
PORTABLE CHEST 1 VIEW

[chest ap]
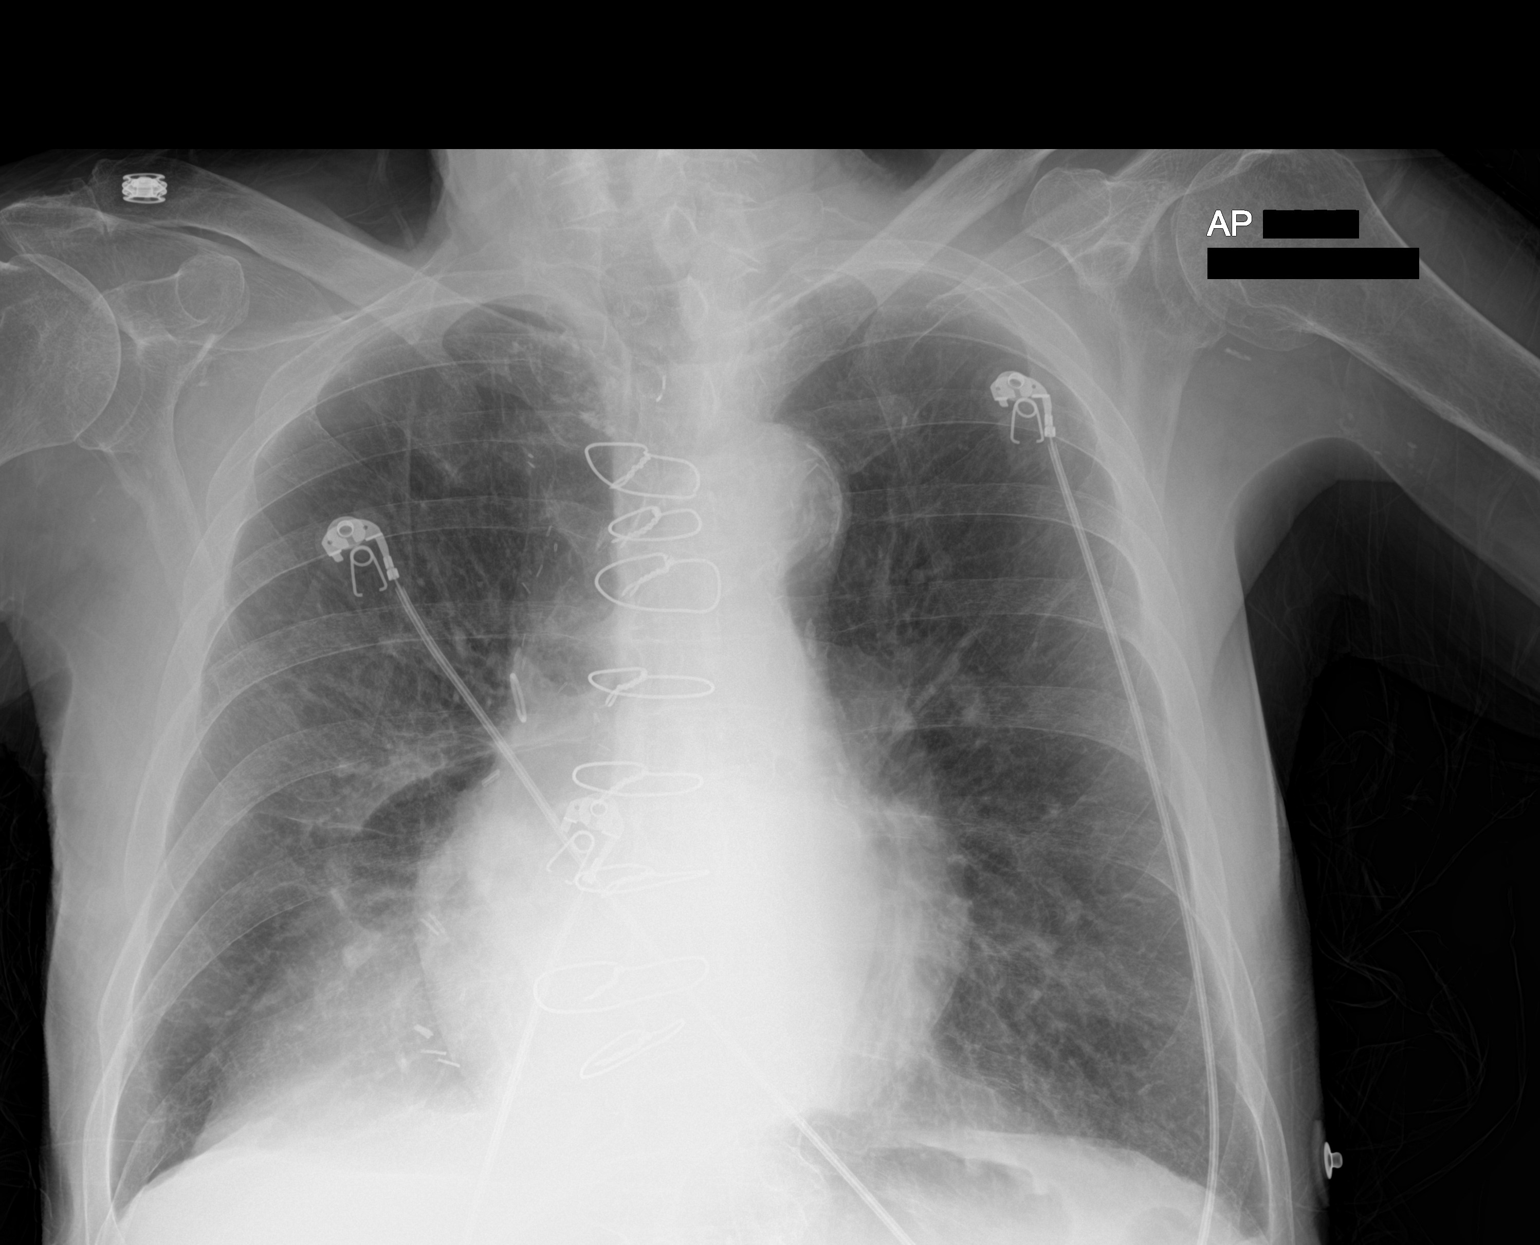

[1 of 1 positions shown; findings below may reference images not displayed]

FINDINGS: Prior CABG. Heart is normal size. Improving bilateral airspace
disease. Continued opacity at the right lung base. No effusions. No
confluent opacities on the left. No acute bony abnormality.
IMPRESSION: Improving aeration of the lungs. Continued right lower lobe
atelectasis or infiltrate.

## 2022-07-01 IMAGING — DX DG CHEST 1V PORT
1 series · 1 of 1 positions shown · non-contrast
Comparison: Chest x-ray 04/10/2021, CT chest 04/11/2021

CLINICAL DATA: History of acute congestive heart failure, weakness,
cough, shortness of breath. History of pneumonia

EXAM:
PORTABLE CHEST 1 VIEW.  Patient is rotated.

[chest ap]
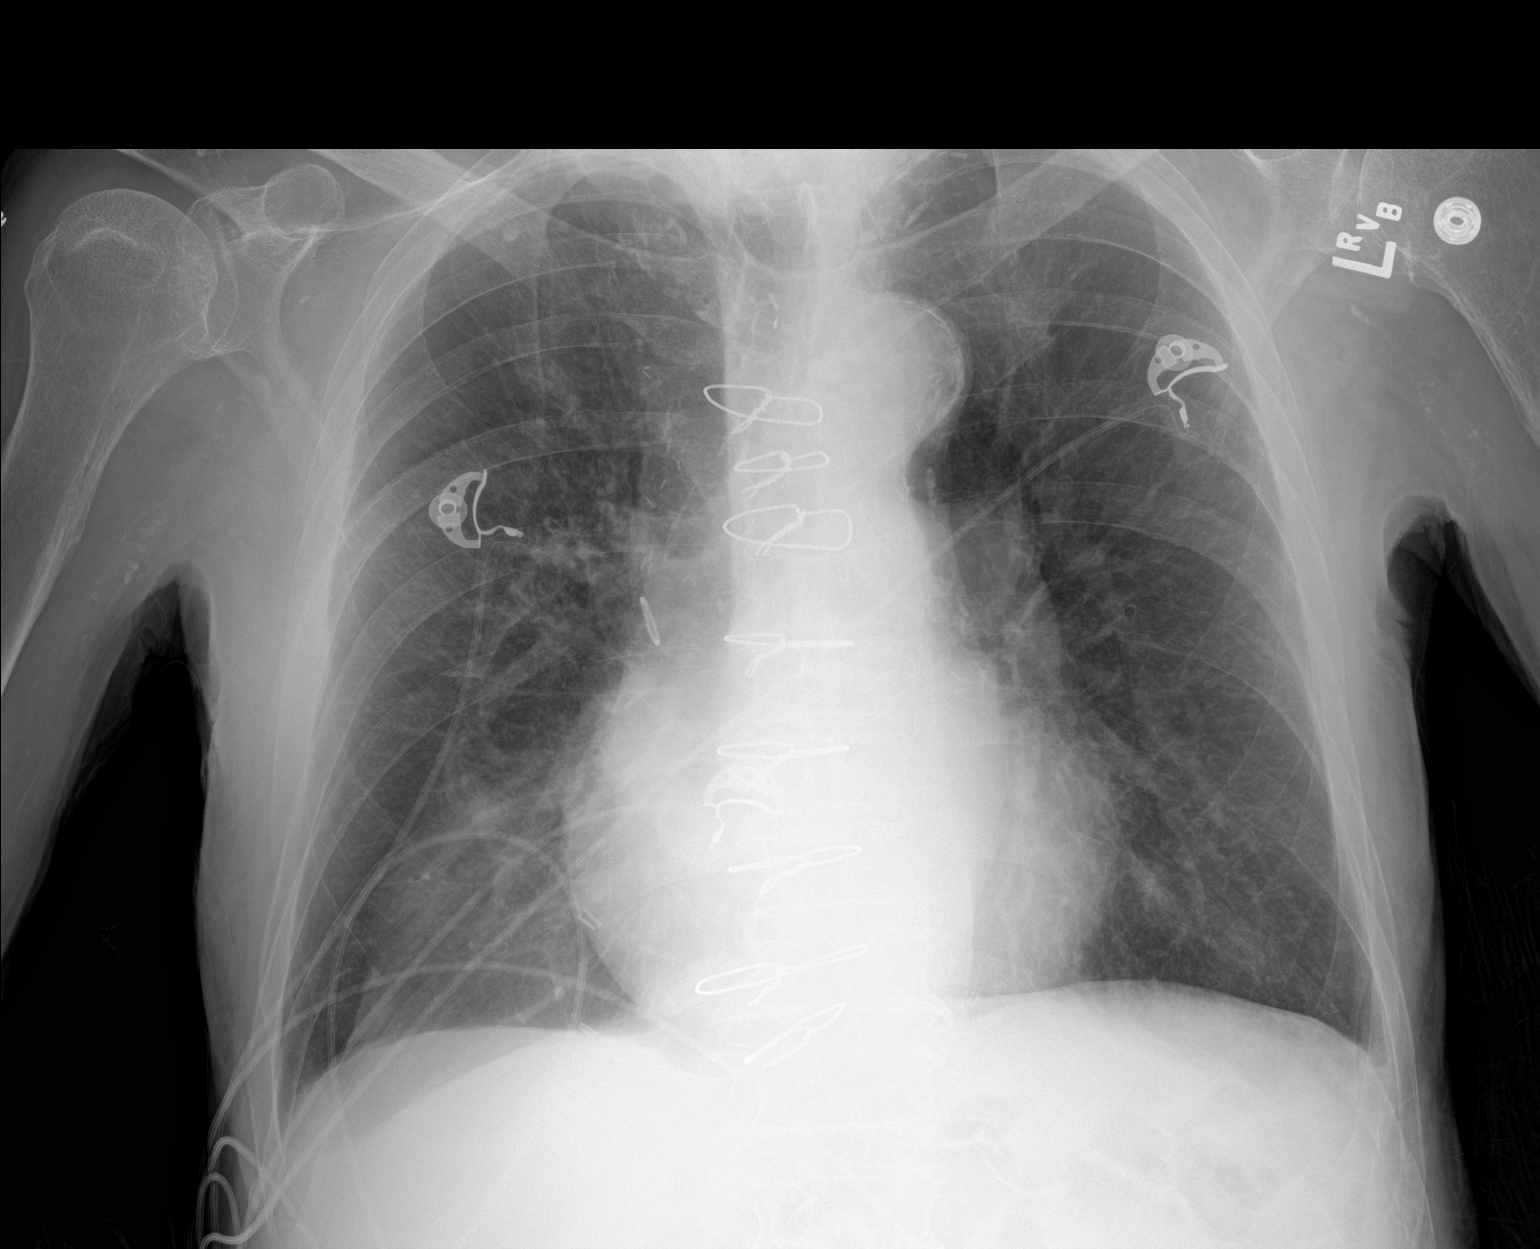

[1 of 1 positions shown; findings below may reference images not displayed]

FINDINGS: The heart size and mediastinal contours are unchanged. Aortic arch
calcification. The large main pulmonary artery.

Biapical pleural/pulmonary scarring. No pulmonary edema. No
pneumothorax. Persistent trace to small volume right and trace left
pleural effusions.

No acute osseous abnormality.
IMPRESSION: 1. Persistent trace to small volume right and trace left pleural
effusions.
2. Pulmonary hypertension.

## 2022-09-02 IMAGING — DX DG CHEST 1V PORT
1 series · 1 of 1 positions shown · non-contrast
Comparison: 04/12/2021

CLINICAL DATA: Not able to walk.  Recent COVID diagnosis

EXAM:
PORTABLE CHEST 1 VIEW

[chest ap]
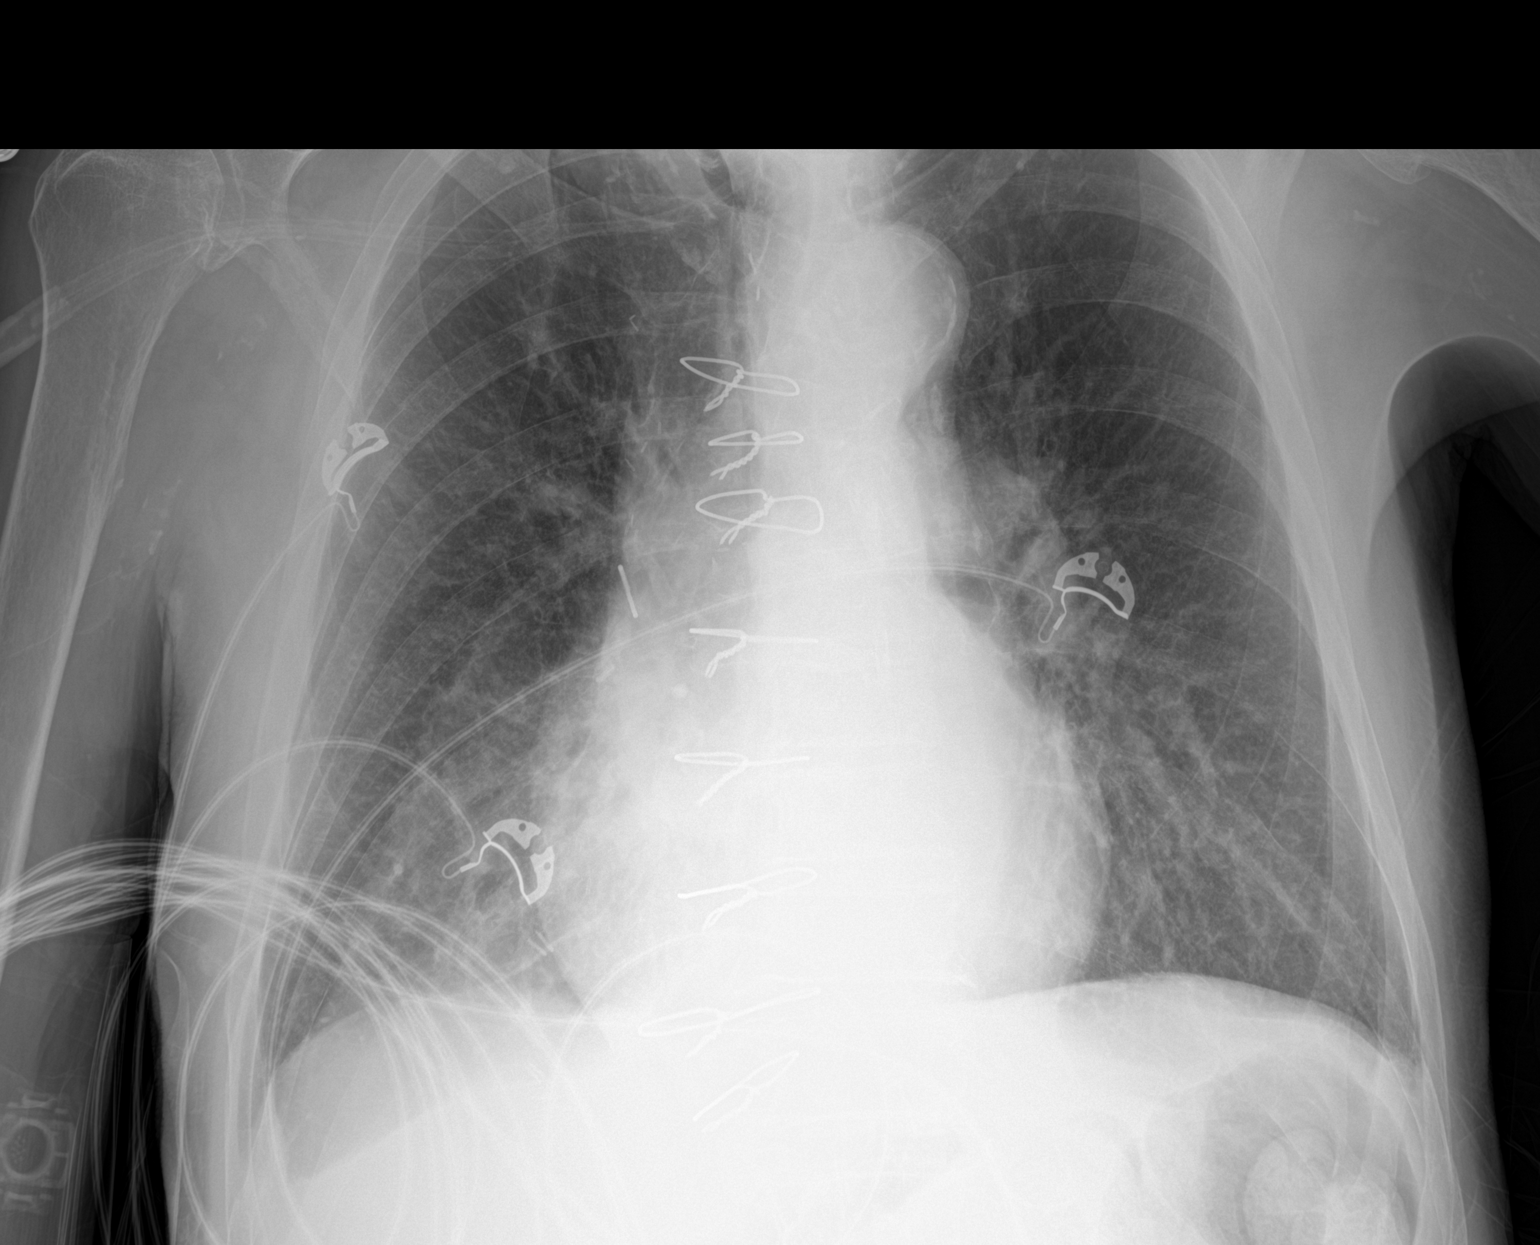

[1 of 1 positions shown; findings below may reference images not displayed]

FINDINGS: Prior median sternotomy. Heart size within normal limits.
Atherosclerotic aortic arch.

Vascular congestion with mild bilateral airspace disease. This could
represent pneumonia or mild edema. No effusion.
IMPRESSION: Mild bilateral airspace disease with slight progression. Question
fluid overload versus pneumonia.
